# Patient Record
Sex: Female | Born: 1950 | Hispanic: Yes | Marital: Married | Smoking: Former smoker
Health system: Southern US, Community
[De-identification: ages and names within clinical notes are randomized; demographics above are authoritative.]

## PROBLEM LIST (undated history)

## (undated) ENCOUNTER — Telehealth

## (undated) ENCOUNTER — Encounter

## (undated) DIAGNOSIS — F419 Anxiety disorder, unspecified: Secondary | ICD-10-CM

## (undated) DIAGNOSIS — G959 Disease of spinal cord, unspecified: Secondary | ICD-10-CM

## (undated) DIAGNOSIS — K219 Gastro-esophageal reflux disease without esophagitis: Secondary | ICD-10-CM

## (undated) DIAGNOSIS — G629 Polyneuropathy, unspecified: Secondary | ICD-10-CM

## (undated) DIAGNOSIS — Z950 Presence of cardiac pacemaker: Secondary | ICD-10-CM

## (undated) DIAGNOSIS — G4730 Sleep apnea, unspecified: Secondary | ICD-10-CM

## (undated) DIAGNOSIS — M4012 Other secondary kyphosis, cervical region: Secondary | ICD-10-CM

## (undated) DIAGNOSIS — M810 Age-related osteoporosis without current pathological fracture: Secondary | ICD-10-CM

## (undated) DIAGNOSIS — I739 Peripheral vascular disease, unspecified: Secondary | ICD-10-CM

## (undated) DIAGNOSIS — I890 Lymphedema, not elsewhere classified: Secondary | ICD-10-CM

## (undated) DIAGNOSIS — F3342 Major depressive disorder, recurrent, in full remission: Secondary | ICD-10-CM

## (undated) DIAGNOSIS — M4316 Spondylolisthesis, lumbar region: Secondary | ICD-10-CM

## (undated) DIAGNOSIS — M1990 Unspecified osteoarthritis, unspecified site: Secondary | ICD-10-CM

## (undated) DIAGNOSIS — R011 Cardiac murmur, unspecified: Secondary | ICD-10-CM

## (undated) DIAGNOSIS — I6522 Occlusion and stenosis of left carotid artery: Secondary | ICD-10-CM

## (undated) DIAGNOSIS — R0602 Shortness of breath: Secondary | ICD-10-CM

## (undated) DIAGNOSIS — E119 Type 2 diabetes mellitus without complications: Secondary | ICD-10-CM

## (undated) DIAGNOSIS — G2581 Restless legs syndrome: Secondary | ICD-10-CM

## (undated) DIAGNOSIS — K222 Esophageal obstruction: Secondary | ICD-10-CM

## (undated) DIAGNOSIS — M48061 Spinal stenosis, lumbar region without neurogenic claudication: Secondary | ICD-10-CM

## (undated) DIAGNOSIS — M797 Fibromyalgia: Secondary | ICD-10-CM

## (undated) DIAGNOSIS — M4802 Spinal stenosis, cervical region: Secondary | ICD-10-CM

## (undated) DIAGNOSIS — E785 Hyperlipidemia, unspecified: Secondary | ICD-10-CM

## (undated) DIAGNOSIS — N393 Stress incontinence (female) (male): Secondary | ICD-10-CM

## (undated) DIAGNOSIS — R32 Unspecified urinary incontinence: Secondary | ICD-10-CM

## (undated) DIAGNOSIS — R112 Nausea with vomiting, unspecified: Secondary | ICD-10-CM

## (undated) DIAGNOSIS — I872 Venous insufficiency (chronic) (peripheral): Secondary | ICD-10-CM

## (undated) DIAGNOSIS — I5189 Other ill-defined heart diseases: Secondary | ICD-10-CM

## (undated) DIAGNOSIS — I10 Essential (primary) hypertension: Secondary | ICD-10-CM

## (undated) DIAGNOSIS — G4700 Insomnia, unspecified: Secondary | ICD-10-CM

## (undated) DIAGNOSIS — Z9889 Other specified postprocedural states: Secondary | ICD-10-CM

## (undated) DIAGNOSIS — Z86718 Personal history of other venous thrombosis and embolism: Secondary | ICD-10-CM

## (undated) DIAGNOSIS — Q76412 Congenital kyphosis, cervical region: Secondary | ICD-10-CM

## (undated) DIAGNOSIS — F32A Depression, unspecified: Secondary | ICD-10-CM

## (undated) HISTORY — PX: ESOPHAGEAL DILATION: SHX303

## (undated) HISTORY — DX: Essential (primary) hypertension: I10

## (undated) HISTORY — PX: HERNIA REPAIR: SHX51

## (undated) HISTORY — PX: APPENDECTOMY: SHX54

## (undated) HISTORY — DX: Restless legs syndrome: G25.81

## (undated) HISTORY — DX: Gastro-esophageal reflux disease without esophagitis: K21.9

## (undated) HISTORY — DX: Type 2 diabetes mellitus without complications: E11.9

## (undated) HISTORY — DX: Sleep apnea, unspecified: G47.30

## (undated) HISTORY — PX: MEDIAL PARTIAL KNEE REPLACEMENT: SHX5965

## (undated) HISTORY — PX: JOINT REPLACEMENT: SHX530

## (undated) HISTORY — DX: Presence of cardiac pacemaker: Z95.0

## (undated) HISTORY — DX: Hyperlipidemia, unspecified: E78.5

## (undated) HISTORY — DX: Peripheral vascular disease, unspecified: I73.9

## (undated) HISTORY — PX: ABDOMINAL HYSTERECTOMY: SHX81

## (undated) HISTORY — DX: Cardiac murmur, unspecified: R01.1

## (undated) HISTORY — PX: POLYPECTOMY: SHX149

## (undated) HISTORY — PX: DG C-ARM 2 VIEW RIGHT SHOULDER (ARMC HX): HXRAD1461

## (undated) HISTORY — PX: OTHER SURGICAL HISTORY: SHX169

## (undated) HISTORY — DX: Unspecified urinary incontinence: R32

## (undated) HISTORY — PX: PACEMAKER IMPLANT: EP1218

## (undated) HISTORY — PX: HYSTERECTOMY ABDOMINAL WITH SALPINGECTOMY: SHX6725

## (undated) HISTORY — PX: BREAST SURGERY: SHX581

## (undated) HISTORY — PX: CARPAL TUNNEL RELEASE: SHX101

## (undated) HISTORY — PX: GASTRIC BYPASS: SHX52

## (undated) HISTORY — PX: DG C-ARM 2 VIEW LEFT SHOULDER (ARMC HX): HXRAD1456

---

## 1996-02-29 HISTORY — PX: CARPAL TUNNEL RELEASE: SHX101

## 2010-02-28 HISTORY — PX: ESOPHAGEAL DILATION: SHX303

## 2010-02-28 HISTORY — PX: OTHER SURGICAL HISTORY: SHX169

## 2012-02-29 HISTORY — PX: SHOULDER SURGERY: SHX246

## 2012-02-29 HISTORY — PX: APPENDECTOMY: SHX54

## 2014-02-28 DIAGNOSIS — Z860101 Personal history of adenomatous and serrated colon polyps: Secondary | ICD-10-CM | POA: Insufficient documentation

## 2014-02-28 DIAGNOSIS — Z86010 Personal history of colonic polyps: Secondary | ICD-10-CM | POA: Insufficient documentation

## 2014-07-31 DIAGNOSIS — R32 Unspecified urinary incontinence: Secondary | ICD-10-CM | POA: Insufficient documentation

## 2014-07-31 DIAGNOSIS — G4733 Obstructive sleep apnea (adult) (pediatric): Secondary | ICD-10-CM | POA: Insufficient documentation

## 2014-07-31 DIAGNOSIS — E782 Mixed hyperlipidemia: Secondary | ICD-10-CM | POA: Insufficient documentation

## 2014-07-31 DIAGNOSIS — E1169 Type 2 diabetes mellitus with other specified complication: Secondary | ICD-10-CM | POA: Insufficient documentation

## 2014-07-31 DIAGNOSIS — G629 Polyneuropathy, unspecified: Secondary | ICD-10-CM | POA: Insufficient documentation

## 2014-07-31 DIAGNOSIS — M797 Fibromyalgia: Secondary | ICD-10-CM | POA: Insufficient documentation

## 2014-07-31 DIAGNOSIS — K219 Gastro-esophageal reflux disease without esophagitis: Secondary | ICD-10-CM | POA: Insufficient documentation

## 2014-07-31 DIAGNOSIS — M1990 Unspecified osteoarthritis, unspecified site: Secondary | ICD-10-CM | POA: Insufficient documentation

## 2014-12-16 DIAGNOSIS — I890 Lymphedema, not elsewhere classified: Secondary | ICD-10-CM | POA: Insufficient documentation

## 2015-01-13 DIAGNOSIS — E1149 Type 2 diabetes mellitus with other diabetic neurological complication: Secondary | ICD-10-CM | POA: Insufficient documentation

## 2015-03-01 HISTORY — PX: SHOULDER SURGERY: SHX246

## 2015-03-01 HISTORY — PX: PACEMAKER IMPLANT: EP1218

## 2015-05-30 DIAGNOSIS — Z950 Presence of cardiac pacemaker: Secondary | ICD-10-CM

## 2015-05-30 HISTORY — PX: PACEMAKER IMPLANT: EP1218

## 2015-05-30 HISTORY — DX: Presence of cardiac pacemaker: Z95.0

## 2015-08-04 DIAGNOSIS — N950 Postmenopausal bleeding: Secondary | ICD-10-CM | POA: Insufficient documentation

## 2015-12-23 ENCOUNTER — Encounter: Payer: Self-pay | Admitting: Family Medicine

## 2016-02-29 HISTORY — PX: TOTAL KNEE ARTHROPLASTY: SHX125

## 2016-06-20 DIAGNOSIS — R002 Palpitations: Secondary | ICD-10-CM | POA: Insufficient documentation

## 2017-01-17 ENCOUNTER — Encounter: Payer: Self-pay | Admitting: Family Medicine

## 2017-01-17 LAB — HM COLONOSCOPY

## 2017-02-28 HISTORY — PX: GASTRIC BYPASS: SHX52

## 2017-02-28 HISTORY — PX: HERNIA REPAIR: SHX51

## 2017-04-18 ENCOUNTER — Ambulatory Visit: Admit: 2017-04-18 | Discharge: 2017-04-19 | Attending: Obstetrics & Gynecology | Primary: Family Medicine

## 2017-04-18 DIAGNOSIS — N950 Postmenopausal bleeding: Principal | ICD-10-CM

## 2017-04-18 DIAGNOSIS — Z6841 Body Mass Index (BMI) 40.0 and over, adult: Secondary | ICD-10-CM

## 2017-04-18 DIAGNOSIS — Z79899 Other long term (current) drug therapy: Secondary | ICD-10-CM

## 2017-04-18 NOTE — Progress Notes
DEPARTMENT OF OBSTETRICS AND GYNECOLOGY    RETURN GYN PROGRESS NOTE      Patient ID:  Name:  Karen Fuentes   MRN: [id number]   Age: 67 y.o.   DOB: 1950-03-21     LMP:  No LMP recorded. Patient is postmenopausal.    Payor: MEDICARE [employer] / Plan: MEDICARE [employer] CHOICE PPO / Product Type: PPO /     Chief Complaint: pmp bleeding    HPI: Karen Fuentes is a 67 y.o. No obstetric history on file. who presents today for referred by pvt md for pmp bleeding, she bleeds every 3-6 months.  Has apparently had a workup including hysteroscopy    Will request copy of office records, transferred referral records in Media tab do not include any important clinical information.         Subjective pain rating: 0 out of 10.    Gyn History:  Patient denies history of STD, PID or abnormal Paps.     Review of Systems:     Constitutional: negative for fevers, chills, sweats, anorexia and weight loss  Respiratory: negative for cough, sputum, hemoptysis or wheezing  Cardiovascular: negative for chest pain, palpitations, syncope, orthopnea, tachypnea  Gastrointestinal: negative for dysphagia,  diarrhea and constipation.    Genitourinary:SEE HPI.  Negative for frequency, dysuria, genital lesions.  Integument: negative for rash, skin lesion(s) and pruritus  Hematologic/lymphatic: negative for easy bruising and petechiae  Musculoskeletal:negative for myalgias, arthralgias and muscle weakness  Neurological: negative for headaches, paresthesia, weakness and numbness  Behavioral/Psych: negative for abusive relationship, anxiety and depression  Endocrine: negative for polyuria, polydipsia and temperature intolerance  Allergic/Immunologic: negative for urticaria and angioedema     Home Medications:    Current Outpatient Medications    Medication Sig Start Date End Date Taking? Authorizing Provider   atorvastatin (LIPITOR) 80 MG Tablet  12/17/14   Information, Historical   doxepin (SINEquan) 50 MG Capsule  03/19/15   Information, Historical

## 2017-04-18 NOTE — Progress Notes
hydrOXYzine pamoate (VISTARIL) 25 MG Capsule  01/30/15   Information, Historical   IRON PO Take 65 mg by mouth daily.    Information, Historical   metFORMIN (GLUCOPHAGE) 500 MG Tablet 1,000 mg 2 times daily (with meals).  01/13/15   Information, Historical   omeprazole (PriLOSEC) 40 MG Capsule Delayed Release  11/07/14   Information, Historical   valsartan-hydroCHLOROthiazide (DIOVAN-HCT) 320-25 MG Tablet  12/17/14   Information, Historical       Allergies:  Patient has no known allergies.    Physical Exam   BP 135/71  - Pulse 98  - Temp 36.6 ?C (97.9 ?F)  - Resp 18  - Ht 1.575 m (5\' 2" )  - Wt 123.4 kg (272 lb)  - BMI 49.75 kg/m?   Body mass index is 49.75 kg/m?Marland Kitchen.   General: alert, cooperative, no distress, appears stated age, morbidly obese, Well developed, well nourished         Genitourinary: deferred      Face to Face Time:  10 of a  10 minute visit.    Procedure:      Lab/Imaging Review:  not reviewed    Medical Decision Making:  Records reviewed: transferrred records in media tab    Assessment and Plan:    ICD-10-CM ICD-9-CM   1. Postmenopausal bleeding N95.0 627.1       Orders:  No orders of the defined types were placed in this encounter.      Medications Prescribed:  No orders of the defined types were placed in this encounter.      Follow up:  Return in about 3 weeks (around 05/09/2017).        Malon KindleJames L Jones, MD  04/18/2017

## 2017-05-09 ENCOUNTER — Ambulatory Visit: Admit: 2017-05-09 | Discharge: 2017-05-09 | Attending: Obstetrics & Gynecology | Primary: Family Medicine

## 2017-05-09 DIAGNOSIS — K219 Gastro-esophageal reflux disease without esophagitis: Secondary | ICD-10-CM

## 2017-05-09 DIAGNOSIS — Z79899 Other long term (current) drug therapy: Secondary | ICD-10-CM

## 2017-05-09 DIAGNOSIS — Z87891 Personal history of nicotine dependence: Secondary | ICD-10-CM

## 2017-05-09 DIAGNOSIS — M1990 Unspecified osteoarthritis, unspecified site: Secondary | ICD-10-CM

## 2017-05-09 DIAGNOSIS — Z01810 Encounter for preprocedural cardiovascular examination: Principal | ICD-10-CM

## 2017-05-09 DIAGNOSIS — I10 Essential (primary) hypertension: Secondary | ICD-10-CM

## 2017-05-09 DIAGNOSIS — E785 Hyperlipidemia, unspecified: Secondary | ICD-10-CM

## 2017-05-09 DIAGNOSIS — Z950 Presence of cardiac pacemaker: Secondary | ICD-10-CM

## 2017-05-09 DIAGNOSIS — Z90710 Acquired absence of both cervix and uterus: Secondary | ICD-10-CM

## 2017-05-09 DIAGNOSIS — F329 Major depressive disorder, single episode, unspecified: Secondary | ICD-10-CM

## 2017-05-09 DIAGNOSIS — I459 Conduction disorder, unspecified: Secondary | ICD-10-CM

## 2017-05-09 DIAGNOSIS — Z7984 Long term (current) use of oral hypoglycemic drugs: Secondary | ICD-10-CM

## 2017-05-09 DIAGNOSIS — F419 Anxiety disorder, unspecified: Secondary | ICD-10-CM

## 2017-05-09 DIAGNOSIS — Z6841 Body Mass Index (BMI) 40.0 and over, adult: Secondary | ICD-10-CM

## 2017-05-09 DIAGNOSIS — N950 Postmenopausal bleeding: Secondary | ICD-10-CM

## 2017-05-09 NOTE — Progress Notes
Cardiac: Regular rate and rhythm, no murmurs, rubs, gallops  Breast: Not Assessed    Abdominal: Soft, nontender, no masses, no organomegaly, no hernia    Back/Spine:  Good range of motion, no palpable abnormalities.    Skin: No visible lesions, rashes, or ulcers    Extremities: No cyanosis, clubbing, or edema, distal pulses 2+    Neurologic: Cranial nerves grossly intact, DTRs 2+ bilaterally    Psychiatric: Alert and oriented x 3, no evidence of anxiety or depression.    Genitourinary: exam obscured by obesity, external genitalia normal, Unable to palpate cervix or uterus, roughness noted on anterior vaginal wall at pubic symphysis      Face to Face Time:  15 of a  15 minute visit.    Procedure:   None     Lab Review:  No results found for this or any previous visit (from the past 24 hour(s)).    Medical Decision Making:  Records reviewed: Transferred records reviewed    Assessment and Plan:    ICD-10-CM ICD-9-CM    1. Postmenopausal bleeding N95.0 627.1 Surgical Case Request: HYSTEROSCOPY, DIAGNOSTIC, D & C, DIAGNOSTIC, NON-OBSTETRICAL      Surgical Case Request: HYSTEROSCOPY, DIAGNOSTIC, D & C, DIAGNOSTIC, NON-OBSTETRICAL   2. Pre-operative cardiovascular examination Z01.810 V72.81 EKG - TRACING      EKG - TRACING       Follow up:           Payor: MEDICARE HU[employer]NA / Plan: MEDICARE [employer] CHOICE PPO / Product Type: PPO /     Malon KindleJames L Jones, MD  05/09/2017

## 2017-05-09 NOTE — Progress Notes
DEPARTMENT OF OBSTETRICS AND GYNECOLOGY    GYN PROGRESS NOTE      Name:  Karen Fuentes   MRN: [id number]   Age: 67 y.o.   DOB: [birth year]     LMP:  No LMP recorded. Patient is postmenopausal.    Chief Complaint: pmp bleeding    HPI: Karen Fuentes is a 67 y.o. No obstetric history on file. who presents today for pmp bleeding, last like a light period in January 2019.  She had been previously evaluated for pmp bleeding, including a D&C which was negative for atypia in 2018.     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    Past Medical History:   Past Medical History:   Diagnosis Date   ? Anxiety    ? Arthritis    ? Depression    ? GERD (gastroesophageal reflux disease)    ? Heart murmur    ? Hyperlipidemia    ? Hypertension        Past Surgical History:   Past Surgical History:   Procedure Laterality Date   ? APPENDECTOMY     ? ARTHROSCOPY SHOULDER W/ ACROMIAL REPAIR Right 03/10/2015    ARTHROSCOPY SHOULDER W/ ACROMIAL REPAIR [facility]runelli[facility]   ? ARTHROSCOPY SHOULDER W/ ROTATOR CUFF REPAIR Right 03/10/2015    ARTHROSCOPY SHOULDER W/ ROTATOR CUFF REPAIR SUBACROMIAL DECOMPRESSION performed by Venancio PoissonBrunelli, Jeffrey Allen[facility]   ? CARPAL TUNNEL RELEASE Left    ? HYSTERECTOMY     ? SHOULDER SURGERY Left        OB History:   Obstetric History     No data available       Gyn History:  Patient denies history of STD, PID or abnormal Paps.     Family History:  Denies any family history of uterine, ovarian, breast or colon cancer.    No family history on file.    Social History:   Social History   Substance Use Topics   ? Smoking status: Former Smoker     Years: 30.00   ? Smokeless tobacco: Former NeurosurgeonUser     Quit date: 02/02/2008   ? Alcohol use Yes

## 2017-05-09 NOTE — Progress Notes
Review of Systems:     Constitutional: negative for fevers, chills, sweats, anorexia and weight loss  Respiratory: negative for cough, sputum, hemoptysis or wheezing  Cardiovascular: negative for chest pain, palpitations, syncope, orthopnea, tachypnea  Gastrointestinal: negative for dysphagia,  diarrhea and constipation.    Genitourinary:SEE HPI.  Negative for frequency, dysuria, genital lesions.  Integument: negative for rash, skin lesion(s) and pruritus  Hematologic/lymphatic: negative for easy bruising and petechiae  Musculoskeletal:negative for myalgias, arthralgias and muscle weakness  Neurological: negative for headaches, paresthesia, weakness and numbness  Behavioral/Psych: negative for abusive relationship, anxiety and depression  Endocrine: negative for polyuria, polydipsia and temperature intolerance  Allergic/Immunologic: negative for urticaria and angioedema     Home Medications:    Outpatient Medications Prior to Visit   Medication Sig   ? atorvastatin (LIPITOR) 80 MG Tablet    ? doxepin (SINEquan) 50 MG Capsule    ? hydrOXYzine pamoate (VISTARIL) 25 MG Capsule    ? IRON PO Take 65 mg by mouth daily.   ? metFORMIN (GLUCOPHAGE) 500 MG Tablet 1,000 mg 2 times daily (with meals).    ? omeprazole (PriLOSEC) 40 MG Capsule Delayed Release    ? valsartan-hydroCHLOROthiazide (DIOVAN-HCT) 320-25 MG Tablet      Last reviewed on 05/09/2017  2:18 PM by Malon KindleJones, James L, MD    Allergies:  Patient has no known allergies.    Physical Exam Exam markedly limited by girth  BP 155/82  - Pulse 92  - Temp 36.6 ?C (97.8 ?F)  - Resp 18  - Ht 1.575 m (5\' 2" )  - Wt 125.9 kg (277 lb 8 oz)  - BMI 50.76 kg/m?   Body mass index is 50.76 kg/m?Marland Kitchen.   General: alert, cooperative, no distress, appears stated age, morbidly obese, Well developed, well nourished     HEENT:  normal dentition, moist mucous membranes.  Neck: Supple, trachea midline, no thyromegaly.    Lungs: clear to auscultation bilaterally with no wheezes, rhonchi, or crackles

## 2017-06-13 DIAGNOSIS — N950 Postmenopausal bleeding: Principal | ICD-10-CM

## 2017-06-13 DIAGNOSIS — E785 Hyperlipidemia, unspecified: Secondary | ICD-10-CM

## 2017-06-13 DIAGNOSIS — I10 Essential (primary) hypertension: Secondary | ICD-10-CM

## 2017-06-13 DIAGNOSIS — Z87891 Personal history of nicotine dependence: Secondary | ICD-10-CM

## 2017-06-13 DIAGNOSIS — Z79899 Other long term (current) drug therapy: Secondary | ICD-10-CM

## 2017-06-13 DIAGNOSIS — N393 Stress incontinence (female) (male): Secondary | ICD-10-CM

## 2017-06-13 DIAGNOSIS — K219 Gastro-esophageal reflux disease without esophagitis: Secondary | ICD-10-CM

## 2017-07-31 ENCOUNTER — Ambulatory Visit: Admit: 2017-07-31 | Discharge: 2017-07-31 | Attending: Obstetrics & Gynecology | Primary: Family Medicine

## 2017-07-31 DIAGNOSIS — Z87891 Personal history of nicotine dependence: Secondary | ICD-10-CM

## 2017-07-31 DIAGNOSIS — K219 Gastro-esophageal reflux disease without esophagitis: Secondary | ICD-10-CM

## 2017-07-31 DIAGNOSIS — F329 Major depressive disorder, single episode, unspecified: Secondary | ICD-10-CM

## 2017-07-31 DIAGNOSIS — R011 Cardiac murmur, unspecified: Secondary | ICD-10-CM

## 2017-07-31 DIAGNOSIS — F419 Anxiety disorder, unspecified: Secondary | ICD-10-CM

## 2017-07-31 DIAGNOSIS — I10 Essential (primary) hypertension: Secondary | ICD-10-CM

## 2017-07-31 DIAGNOSIS — Z79899 Other long term (current) drug therapy: Secondary | ICD-10-CM

## 2017-07-31 DIAGNOSIS — N950 Postmenopausal bleeding: Principal | ICD-10-CM

## 2017-07-31 DIAGNOSIS — N393 Stress incontinence (female) (male): Secondary | ICD-10-CM

## 2017-07-31 DIAGNOSIS — M1990 Unspecified osteoarthritis, unspecified site: Principal | ICD-10-CM

## 2017-07-31 DIAGNOSIS — E785 Hyperlipidemia, unspecified: Secondary | ICD-10-CM

## 2017-07-31 DIAGNOSIS — Z950 Presence of cardiac pacemaker: Secondary | ICD-10-CM

## 2017-08-03 DIAGNOSIS — E785 Hyperlipidemia, unspecified: Secondary | ICD-10-CM

## 2017-08-03 DIAGNOSIS — Z79899 Other long term (current) drug therapy: Secondary | ICD-10-CM

## 2017-08-03 DIAGNOSIS — I10 Essential (primary) hypertension: Secondary | ICD-10-CM

## 2017-08-03 DIAGNOSIS — Z87891 Personal history of nicotine dependence: Secondary | ICD-10-CM

## 2017-08-03 DIAGNOSIS — K219 Gastro-esophageal reflux disease without esophagitis: Secondary | ICD-10-CM

## 2017-08-03 DIAGNOSIS — N393 Stress incontinence (female) (male): Secondary | ICD-10-CM

## 2017-08-03 DIAGNOSIS — N950 Postmenopausal bleeding: Principal | ICD-10-CM

## 2017-08-04 DIAGNOSIS — M1990 Unspecified osteoarthritis, unspecified site: Principal | ICD-10-CM

## 2017-08-04 DIAGNOSIS — Z79899 Other long term (current) drug therapy: Secondary | ICD-10-CM

## 2017-08-04 DIAGNOSIS — Z01810 Encounter for preprocedural cardiovascular examination: Secondary | ICD-10-CM

## 2017-08-04 DIAGNOSIS — K219 Gastro-esophageal reflux disease without esophagitis: Secondary | ICD-10-CM

## 2017-08-04 DIAGNOSIS — N393 Stress incontinence (female) (male): Secondary | ICD-10-CM

## 2017-08-04 DIAGNOSIS — I10 Essential (primary) hypertension: Secondary | ICD-10-CM

## 2017-08-04 DIAGNOSIS — R011 Cardiac murmur, unspecified: Secondary | ICD-10-CM

## 2017-08-04 DIAGNOSIS — Z87891 Personal history of nicotine dependence: Secondary | ICD-10-CM

## 2017-08-04 DIAGNOSIS — E785 Hyperlipidemia, unspecified: Secondary | ICD-10-CM

## 2017-08-04 DIAGNOSIS — F419 Anxiety disorder, unspecified: Secondary | ICD-10-CM

## 2017-08-04 DIAGNOSIS — F329 Major depressive disorder, single episode, unspecified: Secondary | ICD-10-CM

## 2017-08-04 DIAGNOSIS — N950 Postmenopausal bleeding: Principal | ICD-10-CM

## 2017-08-04 MED ORDER — LOSARTAN POTASSIUM-HCTZ 100-25 MG PO TABS
ORAL_TABLET
Start: 2017-08-04 — End: ?

## 2017-08-04 MED ORDER — FUROSEMIDE 40 MG PO TABS: Start: 2017-08-04 — End: ?

## 2017-08-04 MED ORDER — ASPIRIN 81 MG PO TABS
Freq: Every day
Start: 2017-08-04 — End: ?

## 2017-08-04 MED ORDER — PRAMIPEXOLE DIHYDROCHLORIDE ER 4.5 MG PO TB24
ORAL_TABLET | Freq: Every day
Start: 2017-08-04 — End: ?

## 2017-08-04 MED ORDER — METFORMIN HCL 500 MG PO TABS: Start: 2017-08-04 — End: 2017-08-04

## 2017-08-11 ENCOUNTER — Ambulatory Visit

## 2017-08-11 ENCOUNTER — Inpatient Hospital Stay: Admit: 2017-08-11 | Discharge: 2017-08-11 | Primary: Family Medicine

## 2017-08-11 ENCOUNTER — Inpatient Hospital Stay

## 2017-08-11 DIAGNOSIS — I10 Essential (primary) hypertension: Secondary | ICD-10-CM

## 2017-08-11 DIAGNOSIS — K219 Gastro-esophageal reflux disease without esophagitis: Secondary | ICD-10-CM

## 2017-08-11 DIAGNOSIS — E785 Hyperlipidemia, unspecified: Secondary | ICD-10-CM

## 2017-08-11 DIAGNOSIS — F329 Major depressive disorder, single episode, unspecified: Secondary | ICD-10-CM

## 2017-08-11 DIAGNOSIS — Z87891 Personal history of nicotine dependence: Secondary | ICD-10-CM

## 2017-08-11 DIAGNOSIS — N950 Postmenopausal bleeding: Principal | ICD-10-CM

## 2017-08-11 DIAGNOSIS — R011 Cardiac murmur, unspecified: Secondary | ICD-10-CM

## 2017-08-11 DIAGNOSIS — N393 Stress incontinence (female) (male): Secondary | ICD-10-CM

## 2017-08-11 DIAGNOSIS — F419 Anxiety disorder, unspecified: Secondary | ICD-10-CM

## 2017-08-11 DIAGNOSIS — Z79899 Other long term (current) drug therapy: Secondary | ICD-10-CM

## 2017-08-11 DIAGNOSIS — M1990 Unspecified osteoarthritis, unspecified site: Principal | ICD-10-CM

## 2017-08-11 MED ORDER — LACTATED RINGERS IV SOLN
Status: DC | PRN
Start: 2017-08-11 — End: 2017-08-12

## 2017-08-11 MED ORDER — SUGAMMADEX SODIUM 200 MG/2ML IV SOLN
Status: DC | PRN
Start: 2017-08-11 — End: 2017-08-12

## 2017-08-11 MED ORDER — NALBUPHINE HCL 10 MG/ML IJ SOLN
2.5 mg | INTRAVENOUS | Status: DC | PRN
Start: 2017-08-11 — End: 2017-08-12

## 2017-08-11 MED ORDER — SUCCINYLCHOLINE CHLORIDE 140 MG/7ML IV SOSY
Status: DC | PRN
Start: 2017-08-11 — End: 2017-08-12

## 2017-08-11 MED ORDER — FENTANYL CITRATE INJ 50 MCG/ML CUSTOM AMP/VIAL
Status: DC | PRN
Start: 2017-08-11 — End: 2017-08-12

## 2017-08-11 MED ORDER — NALOXONE HCL 0.4 MG/ML IJ SOLN
.4 mg | INTRAVENOUS | Status: DC | PRN
Start: 2017-08-11 — End: 2017-08-12

## 2017-08-11 MED ORDER — LABETALOL HCL 5 MG/ML IV SOLN SH
10 mg | INTRAVENOUS | Status: DC | PRN
Start: 2017-08-11 — End: 2017-08-12

## 2017-08-11 MED ORDER — KETOROLAC TROMETHAMINE 30 MG/ML IJ SOLN
Status: DC | PRN
Start: 2017-08-11 — End: 2017-08-12

## 2017-08-11 MED ORDER — LACTATED RINGERS IV SOLN
1000 mL | Freq: Once | INTRAVENOUS | Status: CP
Start: 2017-08-11 — End: ?

## 2017-08-11 MED ORDER — HALOPERIDOL LACTATE 5 MG/ML IJ SOLN
.5 mg | INTRAVENOUS | Status: DC | PRN
Start: 2017-08-11 — End: 2017-08-12

## 2017-08-11 MED ORDER — IBUPROFEN 800 MG PO TABS
800 mg | Freq: Four times a day (QID) | ORAL | 0 refills | Status: CP | PRN
Start: 2017-08-11 — End: ?

## 2017-08-11 MED ORDER — OXYCODONE HCL 5 MG PO TABS
10 mg | ORAL | Status: DC | PRN
Start: 2017-08-11 — End: 2017-08-12

## 2017-08-11 MED ORDER — HYDRALAZINE HCL 20 MG/ML IJ SOLN
5 mg | INTRAVENOUS | Status: DC | PRN
Start: 2017-08-11 — End: 2017-08-12

## 2017-08-11 MED ORDER — LIDOCAINE HCL (PF) 1 % IJ SOLN SH
INTRAVENOUS | Status: DC | PRN
Start: 2017-08-11 — End: 2017-08-12

## 2017-08-11 MED ORDER — ROCURONIUM BROMIDE 50 MG/5ML IV SOSY
Status: DC | PRN
Start: 2017-08-11 — End: 2017-08-12

## 2017-08-11 MED ORDER — PHENYLEPHRINE HCL-NACL 0.1-0.9 MG/ML-% IV SOLN SH
Status: DC | PRN
Start: 2017-08-11 — End: 2017-08-12

## 2017-08-11 MED ORDER — PROMETHAZINE HCL 25 MG/ML IJ SOLN
6.25 mg | INTRAVENOUS | Status: DC | PRN
Start: 2017-08-11 — End: 2017-08-12

## 2017-08-11 MED ORDER — ONDANSETRON HCL 4 MG/2ML IJ SOLN
Status: DC | PRN
Start: 2017-08-11 — End: 2017-08-12

## 2017-08-11 MED ORDER — DEXAMETHASONE SODIUM PHOSPHATE 4 MG/ML CUSTOM COMPONENT JX
Status: DC | PRN
Start: 2017-08-11 — End: 2017-08-12

## 2017-08-11 MED ORDER — PROPOFOL 10 MG/ML IV EMUL CUSTOM SH
Status: DC | PRN
Start: 2017-08-11 — End: 2017-08-12

## 2017-08-11 MED ORDER — HYDROMORPHONE HCL 1 MG/ML IJ SOLN
.5 mg | INTRAVENOUS | Status: DC | PRN
Start: 2017-08-11 — End: 2017-08-12

## 2017-08-11 MED ORDER — SILVER NITRATE-POT NITRATE 75-25 % EX MISC
Status: DC | PRN
Start: 2017-08-11 — End: 2017-08-12

## 2017-08-14 DIAGNOSIS — K219 Gastro-esophageal reflux disease without esophagitis: Secondary | ICD-10-CM

## 2017-08-14 DIAGNOSIS — M1990 Unspecified osteoarthritis, unspecified site: Principal | ICD-10-CM

## 2017-08-14 DIAGNOSIS — E785 Hyperlipidemia, unspecified: Secondary | ICD-10-CM

## 2017-08-14 DIAGNOSIS — F329 Major depressive disorder, single episode, unspecified: Secondary | ICD-10-CM

## 2017-08-14 DIAGNOSIS — I10 Essential (primary) hypertension: Secondary | ICD-10-CM

## 2017-08-14 DIAGNOSIS — F419 Anxiety disorder, unspecified: Secondary | ICD-10-CM

## 2017-08-14 DIAGNOSIS — R011 Cardiac murmur, unspecified: Secondary | ICD-10-CM

## 2017-08-28 ENCOUNTER — Encounter: Attending: Obstetrics & Gynecology | Primary: Family Medicine

## 2017-08-30 ENCOUNTER — Ambulatory Visit: Admit: 2017-08-30 | Discharge: 2017-08-30 | Attending: Obstetrics & Gynecology | Primary: Family Medicine

## 2017-08-30 DIAGNOSIS — E785 Hyperlipidemia, unspecified: Secondary | ICD-10-CM

## 2017-08-30 DIAGNOSIS — K219 Gastro-esophageal reflux disease without esophagitis: Secondary | ICD-10-CM

## 2017-08-30 DIAGNOSIS — F329 Major depressive disorder, single episode, unspecified: Secondary | ICD-10-CM

## 2017-08-30 DIAGNOSIS — I10 Essential (primary) hypertension: Secondary | ICD-10-CM

## 2017-08-30 DIAGNOSIS — R011 Cardiac murmur, unspecified: Secondary | ICD-10-CM

## 2017-08-30 DIAGNOSIS — N950 Postmenopausal bleeding: Principal | ICD-10-CM

## 2017-08-30 DIAGNOSIS — M1990 Unspecified osteoarthritis, unspecified site: Principal | ICD-10-CM

## 2017-08-30 DIAGNOSIS — F419 Anxiety disorder, unspecified: Secondary | ICD-10-CM

## 2018-03-06 ENCOUNTER — Encounter: Attending: Obstetrics & Gynecology | Primary: Family Medicine

## 2018-09-12 DIAGNOSIS — R0602 Shortness of breath: Secondary | ICD-10-CM | POA: Insufficient documentation

## 2018-09-12 DIAGNOSIS — Z950 Presence of cardiac pacemaker: Secondary | ICD-10-CM | POA: Insufficient documentation

## 2018-10-12 LAB — HEMOGLOBIN A1C: Hemoglobin A1C: 5.5

## 2018-10-18 HISTORY — PX: TOTAL KNEE ARTHROPLASTY: SHX125

## 2018-10-19 DIAGNOSIS — M1712 Unilateral primary osteoarthritis, left knee: Secondary | ICD-10-CM | POA: Insufficient documentation

## 2018-10-31 DIAGNOSIS — M810 Age-related osteoporosis without current pathological fracture: Secondary | ICD-10-CM | POA: Insufficient documentation

## 2018-10-31 DIAGNOSIS — F419 Anxiety disorder, unspecified: Secondary | ICD-10-CM | POA: Insufficient documentation

## 2018-10-31 DIAGNOSIS — R609 Edema, unspecified: Secondary | ICD-10-CM | POA: Insufficient documentation

## 2018-10-31 DIAGNOSIS — R011 Cardiac murmur, unspecified: Secondary | ICD-10-CM | POA: Insufficient documentation

## 2019-02-05 HISTORY — PX: OTHER SURGICAL HISTORY: SHX169

## 2019-02-13 LAB — HM MAMMOGRAPHY

## 2019-03-06 DIAGNOSIS — Z96652 Presence of left artificial knee joint: Secondary | ICD-10-CM | POA: Diagnosis not present

## 2019-03-06 DIAGNOSIS — Z96659 Presence of unspecified artificial knee joint: Secondary | ICD-10-CM | POA: Diagnosis not present

## 2019-03-06 DIAGNOSIS — M25662 Stiffness of left knee, not elsewhere classified: Secondary | ICD-10-CM | POA: Diagnosis not present

## 2019-03-06 DIAGNOSIS — Z09 Encounter for follow-up examination after completed treatment for conditions other than malignant neoplasm: Secondary | ICD-10-CM | POA: Diagnosis not present

## 2019-03-06 DIAGNOSIS — R262 Difficulty in walking, not elsewhere classified: Secondary | ICD-10-CM | POA: Diagnosis not present

## 2019-03-06 DIAGNOSIS — M6281 Muscle weakness (generalized): Secondary | ICD-10-CM | POA: Diagnosis not present

## 2019-03-08 DIAGNOSIS — R262 Difficulty in walking, not elsewhere classified: Secondary | ICD-10-CM | POA: Diagnosis not present

## 2019-03-08 DIAGNOSIS — M25562 Pain in left knee: Secondary | ICD-10-CM | POA: Diagnosis not present

## 2019-03-08 DIAGNOSIS — M6281 Muscle weakness (generalized): Secondary | ICD-10-CM | POA: Diagnosis not present

## 2019-03-08 DIAGNOSIS — M25662 Stiffness of left knee, not elsewhere classified: Secondary | ICD-10-CM | POA: Diagnosis not present

## 2019-03-11 DIAGNOSIS — M25562 Pain in left knee: Secondary | ICD-10-CM | POA: Diagnosis not present

## 2019-03-11 DIAGNOSIS — M25662 Stiffness of left knee, not elsewhere classified: Secondary | ICD-10-CM | POA: Diagnosis not present

## 2019-03-11 DIAGNOSIS — R262 Difficulty in walking, not elsewhere classified: Secondary | ICD-10-CM | POA: Diagnosis not present

## 2019-03-11 DIAGNOSIS — M6281 Muscle weakness (generalized): Secondary | ICD-10-CM | POA: Diagnosis not present

## 2019-03-15 DIAGNOSIS — M25562 Pain in left knee: Secondary | ICD-10-CM | POA: Diagnosis not present

## 2019-03-15 DIAGNOSIS — M6281 Muscle weakness (generalized): Secondary | ICD-10-CM | POA: Diagnosis not present

## 2019-03-15 DIAGNOSIS — R262 Difficulty in walking, not elsewhere classified: Secondary | ICD-10-CM | POA: Diagnosis not present

## 2019-03-15 DIAGNOSIS — M25662 Stiffness of left knee, not elsewhere classified: Secondary | ICD-10-CM | POA: Diagnosis not present

## 2019-03-18 DIAGNOSIS — M25562 Pain in left knee: Secondary | ICD-10-CM | POA: Diagnosis not present

## 2019-03-18 DIAGNOSIS — M6281 Muscle weakness (generalized): Secondary | ICD-10-CM | POA: Diagnosis not present

## 2019-03-18 DIAGNOSIS — R262 Difficulty in walking, not elsewhere classified: Secondary | ICD-10-CM | POA: Diagnosis not present

## 2019-03-18 DIAGNOSIS — M25662 Stiffness of left knee, not elsewhere classified: Secondary | ICD-10-CM | POA: Diagnosis not present

## 2019-03-19 DIAGNOSIS — R921 Mammographic calcification found on diagnostic imaging of breast: Secondary | ICD-10-CM | POA: Diagnosis not present

## 2019-03-19 DIAGNOSIS — N6002 Solitary cyst of left breast: Secondary | ICD-10-CM | POA: Diagnosis not present

## 2019-03-20 DIAGNOSIS — E782 Mixed hyperlipidemia: Secondary | ICD-10-CM | POA: Diagnosis not present

## 2019-03-20 DIAGNOSIS — R0602 Shortness of breath: Secondary | ICD-10-CM | POA: Diagnosis not present

## 2019-03-20 DIAGNOSIS — Z950 Presence of cardiac pacemaker: Secondary | ICD-10-CM | POA: Diagnosis not present

## 2019-03-20 DIAGNOSIS — I739 Peripheral vascular disease, unspecified: Secondary | ICD-10-CM | POA: Diagnosis not present

## 2019-03-22 DIAGNOSIS — M25562 Pain in left knee: Secondary | ICD-10-CM | POA: Diagnosis not present

## 2019-03-22 DIAGNOSIS — R262 Difficulty in walking, not elsewhere classified: Secondary | ICD-10-CM | POA: Diagnosis not present

## 2019-03-22 DIAGNOSIS — M25662 Stiffness of left knee, not elsewhere classified: Secondary | ICD-10-CM | POA: Diagnosis not present

## 2019-03-22 DIAGNOSIS — M6281 Muscle weakness (generalized): Secondary | ICD-10-CM | POA: Diagnosis not present

## 2019-03-22 DIAGNOSIS — R928 Other abnormal and inconclusive findings on diagnostic imaging of breast: Secondary | ICD-10-CM | POA: Diagnosis not present

## 2019-03-25 DIAGNOSIS — M25662 Stiffness of left knee, not elsewhere classified: Secondary | ICD-10-CM | POA: Diagnosis not present

## 2019-03-25 DIAGNOSIS — M25562 Pain in left knee: Secondary | ICD-10-CM | POA: Diagnosis not present

## 2019-03-25 DIAGNOSIS — M6281 Muscle weakness (generalized): Secondary | ICD-10-CM | POA: Diagnosis not present

## 2019-03-25 DIAGNOSIS — R262 Difficulty in walking, not elsewhere classified: Secondary | ICD-10-CM | POA: Diagnosis not present

## 2019-03-29 DIAGNOSIS — R262 Difficulty in walking, not elsewhere classified: Secondary | ICD-10-CM | POA: Diagnosis not present

## 2019-03-29 DIAGNOSIS — M25562 Pain in left knee: Secondary | ICD-10-CM | POA: Diagnosis not present

## 2019-03-29 DIAGNOSIS — M6281 Muscle weakness (generalized): Secondary | ICD-10-CM | POA: Diagnosis not present

## 2019-03-29 DIAGNOSIS — M25662 Stiffness of left knee, not elsewhere classified: Secondary | ICD-10-CM | POA: Diagnosis not present

## 2019-04-01 DIAGNOSIS — M6281 Muscle weakness (generalized): Secondary | ICD-10-CM | POA: Diagnosis not present

## 2019-04-01 DIAGNOSIS — M25662 Stiffness of left knee, not elsewhere classified: Secondary | ICD-10-CM | POA: Diagnosis not present

## 2019-04-01 DIAGNOSIS — M25562 Pain in left knee: Secondary | ICD-10-CM | POA: Diagnosis not present

## 2019-04-01 DIAGNOSIS — R262 Difficulty in walking, not elsewhere classified: Secondary | ICD-10-CM | POA: Diagnosis not present

## 2019-04-05 DIAGNOSIS — M6281 Muscle weakness (generalized): Secondary | ICD-10-CM | POA: Diagnosis not present

## 2019-04-05 DIAGNOSIS — R262 Difficulty in walking, not elsewhere classified: Secondary | ICD-10-CM | POA: Diagnosis not present

## 2019-04-05 DIAGNOSIS — M25662 Stiffness of left knee, not elsewhere classified: Secondary | ICD-10-CM | POA: Diagnosis not present

## 2019-04-05 DIAGNOSIS — M25562 Pain in left knee: Secondary | ICD-10-CM | POA: Diagnosis not present

## 2019-04-12 DIAGNOSIS — Z09 Encounter for follow-up examination after completed treatment for conditions other than malignant neoplasm: Secondary | ICD-10-CM | POA: Diagnosis not present

## 2019-04-12 DIAGNOSIS — Z96652 Presence of left artificial knee joint: Secondary | ICD-10-CM | POA: Diagnosis not present

## 2019-04-15 DIAGNOSIS — R262 Difficulty in walking, not elsewhere classified: Secondary | ICD-10-CM | POA: Diagnosis not present

## 2019-04-15 DIAGNOSIS — M6281 Muscle weakness (generalized): Secondary | ICD-10-CM | POA: Diagnosis not present

## 2019-04-15 DIAGNOSIS — M25562 Pain in left knee: Secondary | ICD-10-CM | POA: Diagnosis not present

## 2019-04-15 DIAGNOSIS — M25662 Stiffness of left knee, not elsewhere classified: Secondary | ICD-10-CM | POA: Diagnosis not present

## 2019-04-22 DIAGNOSIS — R262 Difficulty in walking, not elsewhere classified: Secondary | ICD-10-CM | POA: Diagnosis not present

## 2019-04-22 DIAGNOSIS — M25662 Stiffness of left knee, not elsewhere classified: Secondary | ICD-10-CM | POA: Diagnosis not present

## 2019-04-22 DIAGNOSIS — M6281 Muscle weakness (generalized): Secondary | ICD-10-CM | POA: Diagnosis not present

## 2019-04-22 DIAGNOSIS — M25562 Pain in left knee: Secondary | ICD-10-CM | POA: Diagnosis not present

## 2019-04-26 DIAGNOSIS — R921 Mammographic calcification found on diagnostic imaging of breast: Secondary | ICD-10-CM | POA: Diagnosis not present

## 2019-04-26 DIAGNOSIS — R928 Other abnormal and inconclusive findings on diagnostic imaging of breast: Secondary | ICD-10-CM | POA: Diagnosis not present

## 2019-04-29 DIAGNOSIS — M6281 Muscle weakness (generalized): Secondary | ICD-10-CM | POA: Diagnosis not present

## 2019-04-29 DIAGNOSIS — M25562 Pain in left knee: Secondary | ICD-10-CM | POA: Diagnosis not present

## 2019-04-29 DIAGNOSIS — M25662 Stiffness of left knee, not elsewhere classified: Secondary | ICD-10-CM | POA: Diagnosis not present

## 2019-04-29 DIAGNOSIS — R921 Mammographic calcification found on diagnostic imaging of breast: Secondary | ICD-10-CM | POA: Diagnosis not present

## 2019-04-29 DIAGNOSIS — R262 Difficulty in walking, not elsewhere classified: Secondary | ICD-10-CM | POA: Diagnosis not present

## 2019-04-30 DIAGNOSIS — R920 Mammographic microcalcification found on diagnostic imaging of breast: Secondary | ICD-10-CM | POA: Diagnosis not present

## 2019-06-13 LAB — HM DIABETES EYE EXAM

## 2019-08-20 ENCOUNTER — Ambulatory Visit (INDEPENDENT_AMBULATORY_CARE_PROVIDER_SITE_OTHER): Payer: Medicare Other | Admitting: Family Medicine

## 2019-08-20 ENCOUNTER — Other Ambulatory Visit: Payer: Self-pay

## 2019-08-20 ENCOUNTER — Encounter: Payer: Self-pay | Admitting: Family Medicine

## 2019-08-20 VITALS — BP 116/68 | HR 77 | Temp 97.5°F | Resp 16 | Ht 62.0 in | Wt 200.6 lb

## 2019-08-20 DIAGNOSIS — E785 Hyperlipidemia, unspecified: Secondary | ICD-10-CM

## 2019-08-20 DIAGNOSIS — I872 Venous insufficiency (chronic) (peripheral): Secondary | ICD-10-CM

## 2019-08-20 DIAGNOSIS — G2581 Restless legs syndrome: Secondary | ICD-10-CM | POA: Diagnosis not present

## 2019-08-20 DIAGNOSIS — F3342 Major depressive disorder, recurrent, in full remission: Secondary | ICD-10-CM | POA: Insufficient documentation

## 2019-08-20 DIAGNOSIS — R600 Localized edema: Secondary | ICD-10-CM

## 2019-08-20 DIAGNOSIS — G4709 Other insomnia: Secondary | ICD-10-CM

## 2019-08-20 DIAGNOSIS — M150 Primary generalized (osteo)arthritis: Secondary | ICD-10-CM | POA: Insufficient documentation

## 2019-08-20 DIAGNOSIS — I83893 Varicose veins of bilateral lower extremities with other complications: Secondary | ICD-10-CM | POA: Insufficient documentation

## 2019-08-20 DIAGNOSIS — E1169 Type 2 diabetes mellitus with other specified complication: Secondary | ICD-10-CM

## 2019-08-20 DIAGNOSIS — M8949 Other hypertrophic osteoarthropathy, multiple sites: Secondary | ICD-10-CM

## 2019-08-20 DIAGNOSIS — F331 Major depressive disorder, recurrent, moderate: Secondary | ICD-10-CM | POA: Diagnosis not present

## 2019-08-20 DIAGNOSIS — M159 Polyosteoarthritis, unspecified: Secondary | ICD-10-CM | POA: Insufficient documentation

## 2019-08-20 DIAGNOSIS — Z7689 Persons encountering health services in other specified circumstances: Secondary | ICD-10-CM | POA: Diagnosis not present

## 2019-08-20 DIAGNOSIS — Z950 Presence of cardiac pacemaker: Secondary | ICD-10-CM

## 2019-08-20 DIAGNOSIS — Z9884 Bariatric surgery status: Secondary | ICD-10-CM | POA: Insufficient documentation

## 2019-08-20 DIAGNOSIS — I739 Peripheral vascular disease, unspecified: Secondary | ICD-10-CM

## 2019-08-20 MED ORDER — DULOXETINE HCL 30 MG PO CPEP: 30.0000 mg | ORAL_CAPSULE | Freq: Every day | ORAL | 2 refills | Status: DC

## 2019-08-20 NOTE — Assessment & Plan Note (Signed)
Chronic problem Recurrent, active moderate See PHQ Inadequately treated, not on therapy recently, multiple factors for affecting her depression see HPI  Trial on SNRI Duloxetine 30mg  daily, may help mood depression and also help improve sleep insomnia and neuropathic/chronic pain, titrate dose to 60 in future if tolerated, f/u 4 weeks Review outside record as requested see other meds tried

## 2019-08-20 NOTE — Assessment & Plan Note (Signed)
Likely contributing to some chronic joint pain See A&P

## 2019-08-20 NOTE — Assessment & Plan Note (Signed)
Chronic problem Primarily affecting her pain and sleep w insomnia On Pramipexole - will keep on current dose if helpful Add Duloxetine 30mg  daily to help, had been on Gabapentin unsure reason DC'd can f/u on this future reconsider gabapentin vs lyrica and dose adjust Duloxetine

## 2019-08-20 NOTE — Patient Instructions (Addendum)
Thank you for coming to the office today.  Referral to Cardiologist  [facility]) [facility] at [facility] [address] [place], [place] [address] Main: [phone]   -----------  Referral to Vascular doctor for varicose veins and swelling - likely cause of your left leg pain and symptoms.  [facility] [address] [place], [place] [address]  Main: [phone]   -------------  Please look up name / location / phone number for your doctors and specialist in [place], [place] - and let us know - you can bring a list of this information to the front desk and we can fax records request.  CHeck for copy of your pneumonia vaccine with date.  We do especially need the record of last Colonoscopy to have on your chart as it is required.  For mood and restless leg / pain and sleep - START Duloxetine (Cymbalta) 30mg  once daily in morning, and in future 4 weeks we can discuss again may increase dose.  DUE for FASTING BLOOD WORK (no food or drink after midnight before the lab appointment, only water or coffee without cream/sugar on the morning of)  SCHEDULE "Lab Only" visit in the morning at the clinic for lab draw in 3-4 WEEKS   - Make sure Lab Only appointment is at about 1 week before your next appointment, so that results will be available  For Lab Results, once available within 2-3 days of blood draw, you can can log in to MyChart online to view your results and a brief explanation. Also, we can discuss results at next follow-up visit.    Please schedule a Follow-up Appointment to: Return in about 4 weeks (around 09/17/2019) for 4 week follow-up DM, Leg pain / RLS, Swelling, Lab results.  If you have any other questions or concerns, please feel free to call the office or send a message through MyChart. You may also schedule an earlier appointment if necessary.  Additionally, you may be receiving a survey about your  experience at our office within a few days to 1 week by e-mail or mail. We value your feedback.  09/19/2019, DO [facility], [facility]

## 2019-08-20 NOTE — Progress Notes (Signed)
Subjective:    Patient ID: Janet [name], female    DOB: 09-26-1950, 69 y.o.   MRN: [id number]  Janet [name] is a 69 y.o. female presenting on 08/20/2019 for Establish Care (Left leg pain and numbness chronic as per pt knee surgery was not successful )  Husband Jesus Touchette, newly established here. Moved to this area for past 1 year. Previously in [place].  HPI   Left Leg Pain and Numbness / Possible peripheral neuropathy Chronic L knee pain, history of osteoarthritis, s/p prior L knee TKR surgery and repeat procedure - Prior R knee replacement in past with good results in [facility] - August 2020, left knee replacement, they did repeat knee procedure manipulation 01/2019 - Did PT, still has pain and swelling of left lower extremity  Completed PT and still continuing, with limited relief.  Varicose veins Venous Insufficiency PAD Prior history >3 years ago vascular specialist years ago, had 2 veins removed. Limited improvement. Now still has swelling and painful veins, requesting consultation  History of Bariatric Surgery - Gastric Bypass - She lost 95 lbs in 1.5 years. She has gained recently 5 lbs and has some stress eating lately. - She has history of type 2 diabetes. Her A1c has been controlled after surgery  CHRONIC DM, Type 2: Reports no concerns since bariatric surgery wt loss, major improvement Meds: Metformin 500mg  daily, likely IR Reports good compliance. Tolerating well w/o side-effects Currently on ARB Lifestyle: - Diet (improved)  Denies hypoglycemia, polyuria, visual changes, numbness or tingling.  Restless Leg Syndrome / Insomnia Chronic problem. Taking Mirapex 1mg  BID She admits difficulty sleeping at night with pain and RLS at night, and she will keep awake at night and feel tired during day and often will get depressed due to this problem. - Previously dx with depression in past, was followed by Psychiatry and was on some medication cannot recall names of meds,  >3-4 years ago, she stopped following with them.  CHRONIC HTN / s/p Pacemaker Reports home BP readings 120s/60-70s. Checks it at home occasionally Followed by [facility] for BP and Pacemaker. Current Meds - Losartan-HCTZ 100-25mg  daily, Metoprolol tartrate 25mg  nightly Reports good compliance, took meds today. Tolerating well, w/o complaints. Denies CP, dyspnea, HA, edema, dizziness / lightheadedness  Last colonoscopy EGD / Colonoscopy - [place], good for 5 years - requested record   Health Maintenance:  Followed by [facility] specialist for Breast Cancer - no dx of breast cancer. They did mammogram and it was abnormal she has upcoming repeat biopsy.  Daughter in age 39s with terminal cancer, breast spreading to bone and brain.  Depression screen PHQ 2/9 08/20/2019  Decreased Interest 1  Down, Depressed, Hopeless 1  PHQ - 2 Score 2  Altered sleeping 2  Tired, decreased energy 1  Change in appetite 1  Feeling bad or failure about yourself  1  Trouble concentrating 0  Moving slowly or fidgety/restless 1  Suicidal thoughts 0  PHQ-9 Score 8  Difficult doing work/chores Somewhat difficult    Past Medical History:  Diagnosis Date  . GERD (gastroesophageal reflux disease)   . Heart murmur   . Hyperlipidemia   . Hypertension   . PAD (peripheral artery disease) (HCC)   . Restless leg   . Sleep apnea   . Urinary incontinence    Past Surgical History:  Procedure Laterality Date  . CARPAL TUNNEL RELEASE     Left    Social History   Socioeconomic History  . Marital status:  Married    Spouse name: Not on file  . Number of children: Not on file  . Years of education: Not on file  . Highest education level: Not on file  Occupational History  . Not on file  Tobacco Use  . Smoking status: Former Research scientist (life sciences)  . Smokeless tobacco: Former Network engineer and Sexual Activity  . Alcohol use: Never  . Drug use: Never  . Sexual activity: Not on file  Other Topics Concern   . Not on file  Social History Narrative  . Not on file   Social Determinants of Health   Financial Resource Strain:   . Difficulty of Paying Living Expenses:   Food Insecurity:   . Worried About Charity fundraiser in the Last Year:   . Arboriculturist in the Last Year:   Transportation Needs:   . Film/video editor (Medical):   Marland Kitchen Lack of Transportation (Non-Medical):   Physical Activity:   . Days of Exercise per Week:   . Minutes of Exercise per Session:   Stress:   . Feeling of Stress :   Social Connections:   . Frequency of Communication with Friends and Family:   . Frequency of Social Gatherings with Friends and Family:   . Attends Religious Services:   . Active Member of Clubs or Organizations:   . Attends Archivist Meetings:   Marland Kitchen Marital Status:   Intimate Partner Violence:   . Fear of Current or Ex-Partner:   . Emotionally Abused:   Marland Kitchen Physically Abused:   . Sexually Abused:    Family History  Problem Relation Age of Onset  . Heart disease Father   . Cancer Father        skin  . Thyroid disease Father    Current Outpatient Medications on File Prior to Visit  Medication Sig  . aspirin 81 MG EC tablet Take by mouth.  Marland Kitchen atorvastatin (LIPITOR) 40 MG tablet Take 40 mg by mouth at bedtime.  Marland Kitchen losartan-hydrochlorothiazide (HYZAAR) 100-25 MG tablet Take 1 tablet by mouth daily.  . metFORMIN (GLUCOPHAGE) 500 MG tablet metformin 500 mg tablet  . metoprolol tartrate (LOPRESSOR) 25 MG tablet Take 25 mg by mouth at bedtime.  . pramipexole (MIRAPEX) 1 MG tablet Take 1 tablet (1 mg total) by mouth in the morning and at bedtime.   No current facility-administered medications on file prior to visit.    Review of Systems Per HPI unless specifically indicated above      Objective:    BP 116/68   Pulse 77   Temp (!) 97.5 F (36.4 C) (Temporal)   Resp 16   Ht 5\' 2"  (1.575 m)   Wt 200 lb 9.6 oz (91 kg)   SpO2 98%   BMI 36.69 kg/m   Wt Readings from  Last 3 Encounters:  08/20/19 200 lb 9.6 oz (91 kg)    Physical Exam Vitals and nursing note reviewed.  Constitutional:      General: She is not in acute distress.    Appearance: She is well-developed. She is obese. She is not diaphoretic.     Comments: Well-appearing, comfortable, cooperative  HENT:     Head: Normocephalic and atraumatic.  Eyes:     General:        Right eye: No discharge.        Left eye: No discharge.     Conjunctiva/sclera: Conjunctivae normal.  Neck:     Thyroid: No thyromegaly.  Cardiovascular:     Rate and Rhythm: Normal rate and regular rhythm.     Heart sounds: Normal heart sounds. No murmur heard.   Pulmonary:     Effort: Pulmonary effort is normal. No respiratory distress.     Breath sounds: Normal breath sounds. No wheezing or rales.  Musculoskeletal:        General: Normal range of motion.     Cervical back: Normal range of motion and neck supple.     Right lower leg: Edema (+1) present.     Left lower leg: Edema (+2) present.  Lymphadenopathy:     Cervical: No cervical adenopathy.  Skin:    General: Skin is warm and dry.     Findings: No erythema or rash.     Comments: Stasis dermatitis bilateral lower extremity ankles leg with some discoloration, varicose veins and spider veins  Neurological:     Mental Status: She is alert and oriented to person, place, and time.  Psychiatric:        Behavior: Behavior normal.     Comments: Well groomed, good eye contact, normal speech and thoughts      Diabetic Foot Exam - Simple   Simple Foot Form Diabetic Foot exam was performed with the following findings: Yes 08/20/2019 11:12 AM  Visual Inspection See comments: Yes Sensation Testing Intact to touch and monofilament testing bilaterally: Yes Pulse Check Posterior Tibialis and Dorsalis pulse intact bilaterally: Yes Comments Varicose veins bilateral feet/ankles, some residual edema. No ulceration. Mild callus formation bilateral heels. No deformity  except some toes with evidence of arthritis.      No results found for this or any previous visit.    Assessment & Plan:   Problem List Items Addressed This Visit    Venous insufficiency of both lower extremities   Relevant Medications   losartan-hydrochlorothiazide (HYZAAR) 100-25 MG tablet   aspirin 81 MG EC tablet   atorvastatin (LIPITOR) 40 MG tablet   metoprolol tartrate (LOPRESSOR) 25 MG tablet   Other Relevant Orders   Ambulatory referral to Vascular Surgery   Type 2 diabetes mellitus with other specified complication (HCC) - Primary    Well-controlled DM with A1c previous 5-6 range, since bariatric surgery wt loss Complications - peripheral neuropathy, hyperlipidemia, depression, obesity, OSA  increases risk of future cardiovascular complications   Plan:  1. Continue current therapy - Metformin 500mg  daily, advised can switch to XR daily, on minimal medication /sp bariatric surgery 2. Encourage improved lifestyle - low carb, low sugar diet, reduce portion size, continue improving regular exercise 3. Check CBG , bring log to next visit for review 4. Continue ARB, Statin 5. DM Foot exam done today / Advised to schedule DM ophtho exam, send record      Relevant Medications   losartan-hydrochlorothiazide (HYZAAR) 100-25 MG tablet   metFORMIN (GLUCOPHAGE) 500 MG tablet   aspirin 81 MG EC tablet   atorvastatin (LIPITOR) 40 MG tablet   Symptomatic varicose veins, bilateral   Relevant Medications   losartan-hydrochlorothiazide (HYZAAR) 100-25 MG tablet   aspirin 81 MG EC tablet   atorvastatin (LIPITOR) 40 MG tablet   metoprolol tartrate (LOPRESSOR) 25 MG tablet   Other Relevant Orders   Ambulatory referral to Vascular Surgery   Restless leg syndrome    Chronic problem Primarily affecting her pain and sleep w insomnia On Pramipexole - will keep on current dose if helpful Add Duloxetine 30mg  daily to help, had been on Gabapentin unsure reason DC'd can f/u on this  future  reconsider gabapentin vs lyrica and dose adjust Duloxetine      Relevant Medications   pramipexole (MIRAPEX) 1 MG tablet   DULoxetine (CYMBALTA) 30 MG capsule   Primary osteoarthritis involving multiple joints    Likely contributing to some chronic joint pain See A&P      Relevant Medications   aspirin 81 MG EC tablet   PAD (peripheral artery disease) (HCC)   Relevant Medications   losartan-hydrochlorothiazide (HYZAAR) 100-25 MG tablet   aspirin 81 MG EC tablet   atorvastatin (LIPITOR) 40 MG tablet   metoprolol tartrate (LOPRESSOR) 25 MG tablet   Other Relevant Orders   Ambulatory referral to Cardiology   Ambulatory referral to Vascular Surgery   Other insomnia    See A&P Depression SNRI may help improve      Relevant Medications   DULoxetine (CYMBALTA) 30 MG capsule   Major depressive disorder, recurrent, moderate (HCC)    Chronic problem Recurrent, active moderate See PHQ Inadequately treated, not on therapy recently, multiple factors for affecting her depression see HPI  Trial on SNRI Duloxetine 30mg  daily, may help mood depression and also help improve sleep insomnia and neuropathic/chronic pain, titrate dose to 60 in future if tolerated, f/u 4 weeks Review outside record as requested see other meds tried      Relevant Medications   DULoxetine (CYMBALTA) 30 MG capsule   Edema of left lower extremity   Relevant Orders   Ambulatory referral to Vascular Surgery    Other Visit Diagnoses    Encounter to establish care with new doctor       Cardiac pacemaker       Relevant Orders   Ambulatory referral to Cardiology      Request outside records from prior providers, she will bring list of doctors and locations/phone number  Will request records today from Optometry for DM Eye exam.  #Cardiology S/p Pacemaker, PAD HTN, cardiovascular risk factors DM2, HLD Prior cardiology [facility] health in [place], then prior to that in [place] She is currently doing well, will  refer to local Cardiology for pacemaker management and checks.  #Vascular Lower Extremity Pain and Swelling / L>R, Varicose veins and insufficiency Has chronic Left sided thigh and leg pain with significant concerns from varicose veins causing pain, and has some neuropathic related pain associated. - history of vein stripping procedure prior to 2019 - Also associated RLS symptoms see above - On thiazide - RICE therapy - Trial on Duloxetine for neuropathic pain  Refer to Vascular - [facility] for further management of varicose veins pain/swelling  Orders Placed This Encounter  Procedures  . Ambulatory referral to Cardiology    Referral Priority:   Routine    Referral Type:   Consultation    Referral Reason:   Specialty Services Required    Requested Specialty:   Cardiology    Number of Visits Requested:   1  . Ambulatory referral to Vascular Surgery    Referral Priority:   Routine    Referral Type:   Surgical    Referral Reason:   Specialty Services Required    Requested Specialty:   Vascular Surgery    Number of Visits Requested:   1     Meds ordered this encounter  Medications  . DULoxetine (CYMBALTA) 30 MG capsule    Sig: Take 1 capsule (30 mg total) by mouth daily.    Dispense:  30 capsule    Refill:  2     Follow up  plan: Return in about 4 weeks (around 09/17/2019) for 4 week follow-up DM, Leg pain / RLS, Swelling, Lab results.  Future labs ordered 09/12/19  Saralyn PilarAlexander Antoni Stefan, DO [facility] 08/20/2019, 10:34 AM

## 2019-08-20 NOTE — Assessment & Plan Note (Signed)
See A&P Depression SNRI may help improve

## 2019-08-20 NOTE — Assessment & Plan Note (Signed)
Well-controlled DM with A1c previous 5-6 range, since bariatric surgery wt loss Complications - peripheral neuropathy, hyperlipidemia, depression, obesity, OSA  increases risk of future cardiovascular complications   Plan:  1. Continue current therapy - Metformin 500mg  daily, advised can switch to XR daily, on minimal medication /sp bariatric surgery 2. Encourage improved lifestyle - low carb, low sugar diet, reduce portion size, continue improving regular exercise 3. Check CBG , bring log to next visit for review 4. Continue ARB, Statin 5. DM Foot exam done today / Advised to schedule DM ophtho exam, send record

## 2019-08-25 ENCOUNTER — Encounter: Payer: Self-pay | Admitting: Family Medicine

## 2019-08-30 DIAGNOSIS — Z950 Presence of cardiac pacemaker: Secondary | ICD-10-CM | POA: Diagnosis not present

## 2019-08-30 DIAGNOSIS — R920 Mammographic microcalcification found on diagnostic imaging of breast: Secondary | ICD-10-CM | POA: Diagnosis not present

## 2019-08-30 DIAGNOSIS — R921 Mammographic calcification found on diagnostic imaging of breast: Secondary | ICD-10-CM | POA: Diagnosis not present

## 2019-09-12 ENCOUNTER — Other Ambulatory Visit: Payer: Self-pay

## 2019-09-12 DIAGNOSIS — I872 Venous insufficiency (chronic) (peripheral): Secondary | ICD-10-CM | POA: Diagnosis not present

## 2019-09-12 DIAGNOSIS — E785 Hyperlipidemia, unspecified: Secondary | ICD-10-CM | POA: Diagnosis not present

## 2019-09-12 DIAGNOSIS — F331 Major depressive disorder, recurrent, moderate: Secondary | ICD-10-CM | POA: Diagnosis not present

## 2019-09-12 DIAGNOSIS — E1169 Type 2 diabetes mellitus with other specified complication: Secondary | ICD-10-CM | POA: Diagnosis not present

## 2019-09-13 LAB — COMPLETE METABOLIC PANEL WITH GFR
AG Ratio: 1.6 (calc) (ref 1.0–2.5)
ALT: 17 U/L (ref 6–29)
AST: 17 U/L (ref 10–35)
Albumin: 4.1 g/dL (ref 3.6–5.1)
Alkaline phosphatase (APISO): 98 U/L (ref 37–153)
BUN: 19 mg/dL (ref 7–25)
CO2: 31 mmol/L (ref 20–32)
Calcium: 9.4 mg/dL (ref 8.6–10.4)
Chloride: 102 mmol/L (ref 98–110)
Creat: 0.65 mg/dL (ref 0.50–0.99)
GFR, Est African American: 107 mL/min/{1.73_m2} (ref >=60)
GFR, Est Non African American: 93 mL/min/{1.73_m2} (ref >=60)
Globulin: 2.5 g/dL (calc) (ref 1.9–3.7)
Glucose, Bld: 95 mg/dL (ref 65–99)
Potassium: 4.1 mmol/L (ref 3.5–5.3)
Sodium: 140 mmol/L (ref 135–146)
Total Bilirubin: 1.2 mg/dL (ref 0.2–1.2)
Total Protein: 6.6 g/dL (ref 6.1–8.1)

## 2019-09-13 LAB — CBC WITH DIFFERENTIAL/PLATELET
Absolute Monocytes: 562 cells/uL (ref 200–950)
Basophils Absolute: 30 cells/uL (ref 0–200)
Basophils Relative: 0.4 %
Eosinophils Absolute: 289 cells/uL (ref 15–500)
Eosinophils Relative: 3.8 %
HCT: 42.7 % (ref 35.0–45.0)
Hemoglobin: 13.7 g/dL (ref 11.7–15.5)
Lymphs Abs: 2219 cells/uL (ref 850–3900)
MCH: 30.7 pg (ref 27.0–33.0)
MCHC: 32.1 g/dL (ref 32.0–36.0)
MCV: 95.7 fL (ref 80.0–100.0)
MPV: 11.5 fL (ref 7.5–12.5)
Monocytes Relative: 7.4 %
Neutro Abs: 4499 cells/uL (ref 1500–7800)
Neutrophils Relative %: 59.2 %
Platelets: 265 10*3/uL (ref 140–400)
RBC: 4.46 10*6/uL (ref 3.80–5.10)
RDW: 12.7 % (ref 11.0–15.0)
Total Lymphocyte: 29.2 %
WBC: 7.6 10*3/uL (ref 3.8–10.8)

## 2019-09-13 LAB — TSH: TSH: 1.69 mIU/L (ref 0.40–4.50)

## 2019-09-13 LAB — LIPID PANEL
Cholesterol: 139 mg/dL (ref <200)
HDL: 56 mg/dL (ref >=50)
LDL Cholesterol (Calc): 68 mg/dL (calc)
Non-HDL Cholesterol (Calc): 83 mg/dL (calc) (ref <130)
Total CHOL/HDL Ratio: 2.5 (calc) (ref <5.0)
Triglycerides: 70 mg/dL (ref <150)

## 2019-09-13 LAB — HEMOGLOBIN A1C
Hgb A1c MFr Bld: 5.8 % of total Hgb — ABNORMAL HIGH (ref <5.7)
Mean Plasma Glucose: 120 (calc)
eAG (mmol/L): 6.6 (calc)

## 2019-09-16 ENCOUNTER — Encounter (INDEPENDENT_AMBULATORY_CARE_PROVIDER_SITE_OTHER): Payer: Self-pay

## 2019-09-16 ENCOUNTER — Other Ambulatory Visit: Payer: Self-pay

## 2019-09-16 ENCOUNTER — Encounter (INDEPENDENT_AMBULATORY_CARE_PROVIDER_SITE_OTHER): Payer: Self-pay | Admitting: Nurse Practitioner

## 2019-09-16 ENCOUNTER — Ambulatory Visit (INDEPENDENT_AMBULATORY_CARE_PROVIDER_SITE_OTHER): Payer: Medicare Other | Admitting: Nurse Practitioner

## 2019-09-16 VITALS — BP 137/80 | HR 73 | Resp 17 | Ht 62.0 in | Wt 200.0 lb

## 2019-09-16 DIAGNOSIS — E1169 Type 2 diabetes mellitus with other specified complication: Secondary | ICD-10-CM

## 2019-09-16 DIAGNOSIS — I872 Venous insufficiency (chronic) (peripheral): Secondary | ICD-10-CM

## 2019-09-16 DIAGNOSIS — Z86718 Personal history of other venous thrombosis and embolism: Secondary | ICD-10-CM | POA: Insufficient documentation

## 2019-09-16 DIAGNOSIS — M1712 Unilateral primary osteoarthritis, left knee: Secondary | ICD-10-CM | POA: Diagnosis not present

## 2019-09-17 ENCOUNTER — Encounter (INDEPENDENT_AMBULATORY_CARE_PROVIDER_SITE_OTHER): Payer: Self-pay | Admitting: Nurse Practitioner

## 2019-09-17 NOTE — Progress Notes (Signed)
Subjective:    Patient ID: Janet [name], female    DOB: September 09, 1950, 69 y.o.   MRN: [id number] Chief Complaint  Patient presents with  . New Patient (Initial Visit)    varicose veins    The patient is seen for evaluation of symptomatic varicose veins. The patient relates burning and stinging which worsened steadily throughout the course of the day, particularly with standing. The patient also notes an aching and throbbing pain over the varicosities, particularly with prolonged dependent positions. The symptoms are significantly improved with elevation.  The patient also notes that during hot weather the symptoms are greatly intensified. The patient states the pain from the varicose veins interferes with work, daily exercise, shopping and household maintenance. At this point, the symptoms are persistent and severe enough that they're having a negative impact on lifestyle and are interfering with daily activities.  The patient does have a previous history of having her veins treated approximately 8 to 10 years ago.  The patient believes that it was an endovenous laser ablation.  However despite treatment, she continues to have pain and tenderness within her varicose veins.  There is no history of DVT, PE or superficial thrombophlebitis. There is no history of ulceration or hemorrhage. The patient denies a significant family history of varicose veins.   The patient has to need to wear graduated compression . At the present time the patient been using over-the-counter analgesics.    Review of Systems  Cardiovascular: Positive for leg swelling.       Scattered varicosities  All other systems reviewed and are negative.      Objective:   Physical Exam Vitals reviewed.  HENT:     Head: Normocephalic.  Cardiovascular:     Rate and Rhythm: Normal rate and regular rhythm.     Pulses: Normal pulses.     Heart sounds: Normal heart sounds.  Pulmonary:     Effort: Pulmonary effort is normal.      Breath sounds: Normal breath sounds.  Skin:    General: Skin is warm and dry.  Neurological:     Mental Status: She is alert and oriented to person, place, and time.  Psychiatric:        Mood and Affect: Mood normal.        Behavior: Behavior normal.        Thought Content: Thought content normal.        Judgment: Judgment normal.     BP 137/80 (BP Location: Right Arm)   Pulse 73   Resp 17   Ht 5\' 2"  (1.575 m)   Wt 200 lb (90.7 kg)   BMI 36.58 kg/m   Past Medical History:  Diagnosis Date  . GERD (gastroesophageal reflux disease)   . Heart murmur   . Hyperlipidemia   . Hypertension   . PAD (peripheral artery disease) (HCC)   . Restless leg   . Sleep apnea   . Urinary incontinence     Social History   Socioeconomic History  . Marital status: Married    Spouse name: Not on file  . Number of children: Not on file  . Years of education: Not on file  . Highest education level: Not on file  Occupational History  . Not on file  Tobacco Use  . Smoking status: Former Smoker    Packs/day: 1.00    Years: 25.00    Pack years: 25.00    Types: Cigarettes  . Smokeless tobacco: Former and  Sexual Activity  . Alcohol use: Never  . Drug use: Never  . Sexual activity: Not on file  Other Topics Concern  . Not on file  Social History Narrative  . Not on file   Social Determinants of Health   Financial Resource Strain:   . Difficulty of Paying Living Expenses:   Food Insecurity:   . Worried About Programme researcher, broadcasting/film/video in the Last Year:   . Barista in the Last Year:   Transportation Needs:   . Freight forwarder (Medical):   Marland Kitchen Lack of Transportation (Non-Medical):   Physical Activity:   . Days of Exercise per Week:   . Minutes of Exercise per Session:   Stress:   . Feeling of Stress :   Social Connections:   . Frequency of Communication with Friends and Family:   . Frequency of Social Gatherings with Friends and Family:   . Attends  Religious Services:   . Active Member of Clubs or Organizations:   . Attends Banker Meetings:   Marland Kitchen Marital Status:   Intimate Partner Violence:   . Fear of Current or Ex-Partner:   . Emotionally Abused:   Marland Kitchen Physically Abused:   . Sexually Abused:     Past Surgical History:  Procedure Laterality Date  . APPENDECTOMY  2014  . CARPAL TUNNEL RELEASE Left 1998  . DG C-ARM 2 VIEW LEFT SHOULDER ([facility] HX)    . DG C-ARM 2 VIEW RIGHT SHOULDER ([facility] HX)    . ESOPHAGEAL DILATION  2012   2015, 2017, 2020  . GASTRIC BYPASS  2019  . HERNIA REPAIR  2019  . left knee manipulation Left 02/05/2019  . PACEMAKER IMPLANT  2017  . POLYPECTOMY    . SHOULDER SURGERY Left 2014  . SHOULDER SURGERY Right 2017  . TOTAL KNEE ARTHROPLASTY Right 2018  . TOTAL KNEE ARTHROPLASTY Left 10/18/2018  . vein removal Bilateral 2012    Family History  Problem Relation Age of Onset  . Heart attack Mother 21  . Heart disease Father   . Thyroid disease Father   . Skin cancer Father   . Brain cancer Maternal Grandmother     Not on File     Assessment & Plan:   1. Type 2 diabetes mellitus with other specified complication, without long-term current use of insulin (HCC) Continue hypoglycemic medications as already ordered, these medications have been reviewed and there are no changes at this time.  Hgb A1C to be monitored as already arranged by primary service   2. Venous insufficiency of both lower extremities  Recommend:  The patient has large symptomatic varicose veins that are painful and associated with swelling.  I have had a long discussion with the patient regarding  varicose veins and why they cause symptoms.  Patient will continue wearing graduated compression stockings class 1 on a daily basis, beginning first thing in the morning and removing them in the evening. The patient is instructed specifically not to sleep in the stockings.    The patient  will also continue using  over-the-counter analgesics such as Motrin 600 mg po TID to help control the symptoms.    In addition, behavioral modification including elevation during the day will be continued.    An  ultrasound of the venous system will be obtained.   Further plans will be based on the ultrasound results and whether conservative therapies are successful at eliminating the pain and swelling.   3. Primary  osteoarthritis of left knee Continue NSAID medications as already ordered, these medications have been reviewed and there are no changes at this time.  Continued activity and therapy was stressed.    Current Outpatient Medications on File Prior to Visit  Medication Sig Dispense Refill  . aspirin 81 MG EC tablet Take by mouth.    Marland Kitchen atorvastatin (LIPITOR) 40 MG tablet Take 40 mg by mouth at bedtime.    . DULoxetine (CYMBALTA) 30 MG capsule Take 1 capsule (30 mg total) by mouth daily. 30 capsule 2  . losartan-hydrochlorothiazide (HYZAAR) 100-25 MG tablet Take 1 tablet by mouth daily.    . metFORMIN (GLUCOPHAGE) 500 MG tablet metformin 500 mg tablet    . metoprolol tartrate (LOPRESSOR) 25 MG tablet Take 25 mg by mouth at bedtime.    . pramipexole (MIRAPEX) 1 MG tablet Take 1 tablet (1 mg total) by mouth in the morning and at bedtime. 180 tablet 1   No current facility-administered medications on file prior to visit.    There are no Patient Instructions on file for this visit. No follow-ups on file.   Georgiana Spinner, NP

## 2019-09-19 ENCOUNTER — Other Ambulatory Visit: Payer: Self-pay

## 2019-09-19 ENCOUNTER — Encounter: Payer: Self-pay | Admitting: Family Medicine

## 2019-09-19 ENCOUNTER — Ambulatory Visit (INDEPENDENT_AMBULATORY_CARE_PROVIDER_SITE_OTHER): Payer: Medicare Other | Admitting: Family Medicine

## 2019-09-19 VITALS — BP 120/68 | HR 69 | Temp 97.5°F | Resp 16 | Ht 62.0 in | Wt 201.6 lb

## 2019-09-19 DIAGNOSIS — E1169 Type 2 diabetes mellitus with other specified complication: Secondary | ICD-10-CM

## 2019-09-19 DIAGNOSIS — G4709 Other insomnia: Secondary | ICD-10-CM

## 2019-09-19 DIAGNOSIS — F331 Major depressive disorder, recurrent, moderate: Secondary | ICD-10-CM

## 2019-09-19 DIAGNOSIS — G2581 Restless legs syndrome: Secondary | ICD-10-CM | POA: Diagnosis not present

## 2019-09-19 DIAGNOSIS — F3342 Major depressive disorder, recurrent, in full remission: Secondary | ICD-10-CM

## 2019-09-19 DIAGNOSIS — Z23 Encounter for immunization: Secondary | ICD-10-CM

## 2019-09-19 MED ORDER — DULOXETINE HCL 30 MG PO CPEP: 30.0000 mg | ORAL_CAPSULE | Freq: Every day | ORAL | 1 refills | Status: DC

## 2019-09-19 NOTE — Assessment & Plan Note (Signed)
Well-controlled DM with A1c 5.8 now S/p bariatric surgery wt loss Complications - peripheral neuropathy, hyperlipidemia, depression, obesity, OSA  increases risk of future cardiovascular complications   Plan:  1. Continue current therapy - Metformin 500mg  daily, future we can switch to XR daily, on minimal medication /sp bariatric surgery 2. Encourage improved lifestyle - low carb, low sugar diet, reduce portion size, continue improving regular exercise 3. Check CBG , bring log to next visit for review 4. Continue ARB, Statin 5. Request record [facility] DM Eye 3-05/2019

## 2019-09-19 NOTE — Assessment & Plan Note (Addendum)
Morbid Obesity BMI >36 with co morbid factors - Type 2 Diabetes, Depression, Hyperlipidemia Stable weight now Encourage lifestyle diet improve exercise activity

## 2019-09-19 NOTE — Assessment & Plan Note (Signed)
Chronic problem Recurrent now in remission See PHQ Improved on SNRI Duloxetine 30mg, consider dose adjust for mood/sleep or pain 

## 2019-09-19 NOTE — Progress Notes (Signed)
Subjective:    Patient ID: Janet [name], female    DOB: January 16, 1951, 69 y.o.   MRN: [id number]  Janet [name] is a 69 y.o. female presenting on 09/19/2019 for RLS (follow up after lab work)   HPI  Left Leg Pain and Numbness / Possible peripheral neuropathy Chronic L knee pain, history of osteoarthritis, s/p prior L knee TKR surgery and repeat procedure - [facility] [place] [facility] [place] [facility], left knee replacement, they did repeat knee procedure manipulation 01/2019 - Did PT, still has pain and swelling of left lower extremity  Completed PT and still continuing, with limited relief. Today admits has persistent Left lateral thigh area of numbness. Some improved on Duloxetine.  Varicose veins Venous Insufficiency PAD Prior history >3 years ago vascular specialist years ago, had 2 veins removed. Limited improvement. She has seen [facility]. They discussed options and will pursue US imaging.  History of Bariatric Surgery - Gastric Bypass - She lost 95 lbs in 1.5 years - She has history of type 2 diabetes. Her A1c has been controlled after surgery  CHRONIC DM, Type 2: Reports no concerns since bariatric surgery wt loss, major improvement A1c recently was 5.8, well controlled Meds: Metformin 500mg  daily, IR Reports good compliance. Tolerating well w/o side-effects Currently on ARB Lifestyle: - Diet (improved)  She had DM Eye exam in [facility] [place] - will request record. Denies hypoglycemia, polyuria, visual changes, numbness or tingling.  Restless Leg Syndrome / Insomnia Major Depression recurrent in remission  Chronic problem. Taking Mirapex 1mg  BID She admits difficulty sleeping at night with pain and RLS at night, and she will keep awake at night and feel tired during day - Previously dx with depression in past, was followed by Psychiatry and was on some medication cannot recall names of meds, >3-4 years ago, she stopped  following with them. - Today she states mood is improved - Last visit was started on Duloxetine 30mg  daily. She says is helping mood, and also seems to help some neuropathy pain symptoms. But not resolve. She was on Gabapentin in the past.  CHRONIC HTN / s/p Pacemaker Reports home BP readings 120s/60-70s. Checks it at home occasionally [facility] and Pacemaker. Current Meds - Losartan-HCTZ 100-25mg  daily, Metoprolol tartrate 25mg  nightly Reports good compliance, took meds today. Tolerating well, w/o complaints. Denies CP, dyspnea, HA, edema, dizziness / lightheadedness    Health Maintenance:  Followed by [facility] for Breast Cancer - no dx of breast cancer. They did mammogram and it was abnormal she has upcoming repeat biopsy.  Due for 2nd pneumonia vaccine >1 year after previous vaccine, now to receive Pneumovax-23 today   Depression screen [facility] 2/9 09/19/2019 08/20/2019  Decreased Interest 0 1  Down, Depressed, Hopeless 0 1  PHQ - 2 Score 0 2  Altered sleeping 0 2  Tired, decreased energy 0 1  Change in appetite 0 1  Feeling bad or failure about yourself  0 1  Trouble concentrating 0 0  Moving slowly or fidgety/restless 0 1  Suicidal thoughts 0 0  PHQ-9 Score 0 8  Difficult doing work/chores Not difficult at all Somewhat difficult    Social History   Tobacco Use  . Smoking status: Former Smoker    Packs/day: 1.00    Years: 25.00    Pack years: 25.00    Types: Cigarettes  . Smokeless tobacco: Former Engineer, waterUser  Substance Use Topics  .  Alcohol use: Never  . Drug use: Never    Review of Systems Per HPI unless specifically indicated above     Objective:    BP 120/68   Pulse 69   Temp (!) 97.5 F (36.4 C) (Temporal)   Resp 16   Ht 5\' 2"  (1.575 m)   Wt 201 lb 9.6 oz (91.4 kg)   SpO2 98%   BMI 36.87 kg/m   Wt Readings from Last 3 Encounters:  09/19/19 201 lb 9.6 oz (91.4 kg)  09/16/19 200 lb (90.7 kg)  08/20/19 200 lb 9.6  oz (91 kg)    Physical Exam Vitals and nursing note reviewed.  Constitutional:      General: She is not in acute distress.    Appearance: She is well-developed. She is obese. She is not diaphoretic.     Comments: Well-appearing, comfortable, cooperative  HENT:     Head: Normocephalic and atraumatic.  Eyes:     General:        Right eye: No discharge.        Left eye: No discharge.     Conjunctiva/sclera: Conjunctivae normal.  Neck:     Thyroid: No thyromegaly.  Cardiovascular:     Rate and Rhythm: Normal rate and regular rhythm.     Heart sounds: Normal heart sounds. No murmur heard.   Pulmonary:     Effort: Pulmonary effort is normal. No respiratory distress.     Breath sounds: Normal breath sounds. No wheezing or rales.  Musculoskeletal:        General: Normal range of motion.     Cervical back: Normal range of motion and neck supple.     Right lower leg: Edema (+1) present.     Left lower leg: Edema (+2) present.  Lymphadenopathy:     Cervical: No cervical adenopathy.  Skin:    General: Skin is warm and dry.     Findings: No erythema or rash.     Comments: Stasis dermatitis bilateral lower extremity ankles leg with some discoloration, varicose veins and spider veins  Neurological:     Mental Status: She is alert and oriented to person, place, and time.     Sensory: Sensory deficit (left lateral outer thigh) present.  Psychiatric:        Behavior: Behavior normal.     Comments: Well groomed, good eye contact, normal speech and thoughts      Recent Labs    10/12/18 0000 09/12/19 0803  HGBA1C 5.5 5.8*     Results for orders placed or performed in visit on 09/12/19  TSH  Result Value Ref Range   TSH 1.69 0.40 - 4.50 mIU/L  Lipid panel  Result Value Ref Range   Cholesterol 139 <200 mg/dL   HDL 56 > OR = 50 mg/dL   Triglycerides 70 09/14/19 mg/dL   LDL Cholesterol (Calc) 68 mg/dL (calc)   Total CHOL/HDL Ratio 2.5 <5.0 (calc)   Non-HDL Cholesterol (Calc) 83 <093  mg/dL (calc)  COMPLETE METABOLIC PANEL WITH GFR  Result Value Ref Range   Glucose, Bld 95 65 - 99 mg/dL   BUN 19 7 - 25 mg/dL   Creat <235 5.73 - 2.20 mg/dL   GFR, Est Non African American 93 > OR = 60 mL/min/1.61m2   GFR, Est African American 107 > OR = 60 mL/min/1.71m2   BUN/Creatinine Ratio NOT APPLICABLE 6 - 22 (calc)   Sodium 140 135 - 146 mmol/L   Potassium 4.1 3.5 - 5.3 mmol/L  Chloride 102 98 - 110 mmol/L   CO2 31 20 - 32 mmol/L   Calcium 9.4 8.6 - 10.4 mg/dL   Total Protein 6.6 6.1 - 8.1 g/dL   Albumin 4.1 3.6 - 5.1 g/dL   Globulin 2.5 1.9 - 3.7 g/dL (calc)   AG Ratio 1.6 1.0 - 2.5 (calc)   Total Bilirubin 1.2 0.2 - 1.2 mg/dL   Alkaline phosphatase (APISO) 98 37 - 153 U/L   AST 17 10 - 35 U/L   ALT 17 6 - 29 U/L  CBC with Differential/Platelet  Result Value Ref Range   WBC 7.6 3.8 - 10.8 Thousand/uL   RBC 4.46 3.80 - 5.10 Million/uL   Hemoglobin 13.7 11.7 - 15.5 g/dL   HCT 45.6 35 - 45 %   MCV 95.7 80.0 - 100.0 fL   MCH 30.7 27.0 - 33.0 pg   MCHC 32.1 32.0 - 36.0 g/dL   RDW 25.6 38.9 - 37.3 %   Platelets 265 140 - 400 Thousand/uL   MPV 11.5 7.5 - 12.5 fL   Neutro Abs 4,499 1,500 - 7,800 cells/uL   Lymphs Abs 2,219 850 - 3,900 cells/uL   Absolute Monocytes 562 200 - 950 cells/uL   Eosinophils Absolute 289 15 - 500 cells/uL   Basophils Absolute 30 0 - 200 cells/uL   Neutrophils Relative % 59.2 %   Total Lymphocyte 29.2 %   Monocytes Relative 7.4 %   Eosinophils Relative 3.8 %   Basophils Relative 0.4 %  Hemoglobin A1c  Result Value Ref Range   Hgb A1c MFr Bld 5.8 (H) <5.7 % of total Hgb   Mean Plasma Glucose 120 (calc)   eAG (mmol/L) 6.6 (calc)      Assessment & Plan:   Problem List Items Addressed This Visit    Type 2 diabetes mellitus with other specified complication (HCC) - Primary    Well-controlled DM with A1c 5.8 now S/p bariatric surgery wt loss Complications - peripheral neuropathy, hyperlipidemia, depression, obesity, OSA  increases risk of  future cardiovascular complications   Plan:  1. Continue current therapy - Metformin 500mg  daily, future we can switch to XR daily, on minimal medication /sp bariatric surgery 2. Encourage improved lifestyle - low carb, low sugar diet, reduce portion size, continue improving regular exercise 3. Check CBG , bring log to next visit for review 4. Continue ARB, Statin 5. Request record  DM Eye 3-05/2019      Restless leg syndrome    Chronic problem Primarily affecting her pain and sleep w insomnia On Pramipexole - will keep on current dose if helpful Continue Duloxetine 30mg  daily - some relief, may dose adjust to 60 vs add back Gabapentin - f/u at next visit      Relevant Medications   DULoxetine (CYMBALTA) 30 MG capsule   Other insomnia    See A&P Depression Continue SNRI      Relevant Medications   DULoxetine (CYMBALTA) 30 MG capsule   Morbid obesity (HCC)    Morbid Obesity BMI >36 with co morbid factors - Type 2 Diabetes, Depression, Hyperlipidemia Stable weight now Encourage lifestyle diet improve exercise activity      Major depression, recurrent, full remission (HCC)    Chronic problem Recurrent now in remission See PHQ Improved on SNRI Duloxetine 30mg , consider dose adjust for mood/sleep or pain      Relevant Medications   DULoxetine (CYMBALTA) 30 MG capsule    Other Visit Diagnoses    Need for 23-polyvalent  pneumococcal polysaccharide vaccine       Relevant Orders   Pneumococcal polysaccharide vaccine 23-valent greater than or equal to 2yo subcutaneous/IM (Completed)      Request [facility] DM Eye exam March/April 2021  Meds ordered this encounter  Medications  . DULoxetine (CYMBALTA) 30 MG capsule    Sig: Take 1 capsule (30 mg total) by mouth daily.    Dispense:  90 capsule    Refill:  1    Switch to 90 day supply      Follow up plan: Return in about 4 months (around 01/20/2020) for 4 month DM A1c, RLS neuropathy,  mood PHQ.   Saralyn Pilar, DO [facility] 09/19/2019, 9:49 AM

## 2019-09-19 NOTE — Assessment & Plan Note (Signed)
Chronic problem Primarily affecting her pain and sleep w insomnia On Pramipexole - will keep on current dose if helpful Continue Duloxetine 30mg  daily - some relief, may dose adjust to 60 vs add back Gabapentin - f/u at next visit

## 2019-09-19 NOTE — Assessment & Plan Note (Signed)
See A&P Depression Continue SNRI 

## 2019-09-19 NOTE — Patient Instructions (Addendum)
Thank you for coming to the office today.  1. Chemistry - Normal results, including electrolytes, kidney and liver function. Normal fasting blood sugar   2. Hemoglobin A1c (Diabetes) - 5.8, controlled diabetes.  3. TSH Thyroid Function Tests - Normal.  4. Cholesterol - Controlled Cholesterol on Atorvastatin 40mg .  5. CBC Blood Counts - Normal, no anemia, other abnormality  Please schedule a Follow-up Appointment to: Return in about 4 months (around 01/20/2020) for 4 month DM A1c, RLS neuropathy, mood PHQ.  If you have any other questions or concerns, please feel free to call the office or send a message through MyChart. You may also schedule an earlier appointment if necessary.  Additionally, you may be receiving a survey about your experience at our office within a few days to 1 week by e-mail or mail. We value your feedback.  01/22/2020, DO [facility], [facility]

## 2019-09-24 ENCOUNTER — Encounter: Payer: Self-pay | Admitting: Family Medicine

## 2019-10-10 ENCOUNTER — Ambulatory Visit (INDEPENDENT_AMBULATORY_CARE_PROVIDER_SITE_OTHER): Payer: Medicare Other | Admitting: Vascular Surgery

## 2019-10-10 ENCOUNTER — Encounter (INDEPENDENT_AMBULATORY_CARE_PROVIDER_SITE_OTHER): Payer: Medicare Other

## 2019-10-24 ENCOUNTER — Ambulatory Visit: Payer: Medicare Other | Admitting: Internal Medicine

## 2020-01-02 ENCOUNTER — Other Ambulatory Visit: Payer: Self-pay | Admitting: Family Medicine

## 2020-01-02 DIAGNOSIS — G2581 Restless legs syndrome: Secondary | ICD-10-CM

## 2020-01-02 MED ORDER — PRAMIPEXOLE DIHYDROCHLORIDE 1 MG PO TABS: 1.0000 mg | ORAL_TABLET | Freq: Two times a day (BID) | ORAL | 1 refills | Status: DC

## 2020-03-03 ENCOUNTER — Encounter: Payer: Self-pay | Admitting: Family Medicine

## 2020-03-03 ENCOUNTER — Ambulatory Visit (INDEPENDENT_AMBULATORY_CARE_PROVIDER_SITE_OTHER): Payer: Medicare Other | Admitting: Family Medicine

## 2020-03-03 ENCOUNTER — Other Ambulatory Visit: Payer: Self-pay

## 2020-03-03 VITALS — BP 136/85 | HR 72 | Temp 97.1°F | Resp 16 | Ht 62.0 in | Wt 208.6 lb

## 2020-03-03 DIAGNOSIS — F3342 Major depressive disorder, recurrent, in full remission: Secondary | ICD-10-CM

## 2020-03-03 DIAGNOSIS — F331 Major depressive disorder, recurrent, moderate: Secondary | ICD-10-CM

## 2020-03-03 DIAGNOSIS — G2581 Restless legs syndrome: Secondary | ICD-10-CM

## 2020-03-03 DIAGNOSIS — Z23 Encounter for immunization: Secondary | ICD-10-CM

## 2020-03-03 DIAGNOSIS — E1169 Type 2 diabetes mellitus with other specified complication: Secondary | ICD-10-CM | POA: Diagnosis not present

## 2020-03-03 DIAGNOSIS — I10 Essential (primary) hypertension: Secondary | ICD-10-CM

## 2020-03-03 DIAGNOSIS — M7551 Bursitis of right shoulder: Secondary | ICD-10-CM

## 2020-03-03 DIAGNOSIS — G4709 Other insomnia: Secondary | ICD-10-CM

## 2020-03-03 LAB — POCT GLYCOSYLATED HEMOGLOBIN (HGB A1C): Hemoglobin A1C: 5.4 % (ref 4.0–5.6)

## 2020-03-03 MED ORDER — LOSARTAN POTASSIUM-HCTZ 100-25 MG PO TABS: 1.0000 | ORAL_TABLET | Freq: Every day | ORAL | 3 refills | Status: DC

## 2020-03-03 MED ORDER — DULOXETINE HCL 30 MG PO CPEP: 30.0000 mg | ORAL_CAPSULE | Freq: Every day | ORAL | 3 refills | Status: DC

## 2020-03-03 MED ORDER — BACLOFEN 10 MG PO TABS: 5.0000 mg | ORAL_TABLET | Freq: Every evening | ORAL | 2 refills | Status: DC | PRN

## 2020-03-03 MED ORDER — METOPROLOL TARTRATE 25 MG PO TABS: 25.0000 mg | ORAL_TABLET | Freq: Every day | ORAL | 3 refills | Status: DC

## 2020-03-03 MED ORDER — METFORMIN HCL 500 MG PO TABS: 500.0000 mg | ORAL_TABLET | Freq: Every day | ORAL | 3 refills | Status: DC

## 2020-03-03 MED ORDER — MELOXICAM 15 MG PO TABS: 15.0000 mg | ORAL_TABLET | Freq: Every day | ORAL | 2 refills | Status: DC | PRN

## 2020-03-03 MED ORDER — ROPINIROLE HCL 0.5 MG PO TABS: 0.5000 mg | ORAL_TABLET | Freq: Every day | ORAL | 2 refills | Status: DC

## 2020-03-03 NOTE — Patient Instructions (Addendum)
Thank you for coming to the office today.  Most likely you have bursitis of your shoulder. This is inflammation of the shoulder joint caused most often by arthritis or wear and tear. Often it can flare up to cause bursitis due to repetitive activities or other triggers. It may take time to heal, possibly 2 to 6 weeks, and it is important to avoid over use of shoulder especially above head motions that can re-aggravate the problem.  Recommend trial of Anti-inflammatory with Naproxen (Naprosyn) 500mg  tabs - take one with food and plenty of water TWICE daily every day (breakfast and dinner), for next 2 to 4 weeks, then you may take only as needed  - DO NOT TAKE any ibuprofen, aleve, motrin while you are taking this medicine  Recommend to start taking Tylenol Extra Strength 500mg  tabs - take 1 to 2 tabs per dose (max 1000mg ) every 6-8 hours for pain (take regularly, don't skip a dose for next 7 days), max 24 hour daily dose is 6 tablets or 3000mg . In the future you can repeat the same everyday Tylenol course for 1-2 weeks at a time.   Start taking Baclofen (Lioresal) 10mg  (muscle relaxant) - start with half (cut) to one whole pill at night as needed for next 1-3 nights (may make you drowsy, caution with driving) see how it affects you, then if tolerated increase to one pill 2 to 3 times a day or (every 8 hours as needed)    ------------------------------------------  Recent Labs    09/12/19 0803 03/03/20 0851  HGBA1C 5.8* 5.4    ----------------------------------------------------   If not improving as expected or keeps waking you up - call our office 1-2 weeks - we can add a muscle relaxant to help sleep.  Also we can refer you to Physical Therapy if just need to improve on range of motion.  Lastly if not improved by about 4-6 weeks - or just need treatment sooner - call and return for a Steroid Injection in shoulder.   Please schedule a Follow-up Appointment to: Return in about 6 weeks  (around 04/14/2020) for 6 week follow-up RLS, R Shoulder Bursitis.  If you have any other questions or concerns, please feel free to call the office or send a message through MyChart. You may also schedule an earlier appointment if necessary.  Additionally, you may be receiving a survey about your experience at our office within a few days to 1 week by e-mail or mail. We value your feedback.  , DO [facility], 09/14/19

## 2020-03-03 NOTE — Assessment & Plan Note (Signed)
See A&P Depression Continue SNRI

## 2020-03-03 NOTE — Progress Notes (Signed)
Subjective:    Patient ID: Janet [name], female    DOB: 08-23-1950, 70 y.o.   MRN: [id number]  Janet [name] is a 70 y.o. female presenting on 03/03/2020 for Diabetes (4 month DM A1c, RLS neuropathy, mood PHQ.//)   HPI   Left Leg Pain and Numbness/ Possible peripheral neuropathy Chronic L knee pain, history of osteoarthritis, s/p prior L knee TKR surgery and repeat procedure - Prior R knee replacement in past with good results in [facility] - August 2020, left knee replacement, they did repeat knee procedure manipulation 01/2019 - Did PT, still has pain and swelling of left lower extremity Completed PT and still continuing, with limited relief. Today admits has persistent Left lateral thigh area of numbness. Some improved on Duloxetine. Needs refill  Varicose veins Venous Insufficiency PAD Prior history >3 years ago vascular specialist years ago, had 2 veins removed. Limited improvement. She has seen [facility]. They discussed options and will pursue US imaging.   History of Bariatric Surgery - Gastric Bypass - She lost 95 lbs in 1.5 years - She has history of type 2 diabetes. Her A1c has been controlled after surgery   CHRONIC DM, Type 2: Reports no concernssince bariatric surgery wt loss, major improvement A1c recently was 5.8, well controlled now due for repeat A1c today Meds:Metformin 500mg  daily, IR - needs refill Reports good compliance. Tolerating well w/o side-effects Currently on ARB Lifestyle: - Diet (improved)  She had DM Eye exam in [facility] Denies hypoglycemia, polyuria, visual changes, numbness or tingling.  Restless Leg Syndrome / Insomnia Major Depression recurrent in remission  Chronic problem. Taking Mirapex 1mg  BID - says no improvement on Mirapex, would like to switch to other med, in past cannot recall if or when took Ropinirole. She had tried Gabapentin before considering this. She admits difficulty sleeping at night with pain and  RLS at night, and she will keep awake at night and feel tired during day - Previously dx with depression in past, was followed by Psychiatry and was on some medication cannot recall names of meds, >3-4 years ago, she stopped following with them. - Today she states mood is improved - Last visit was started on Duloxetine 30mg  daily. She says is helping mood, and also seems to help some neuropathy pain symptoms. But not resolve. She was on Gabapentin and Ropinirole in past.  CHRONIC HTN/ s/p Pacemaker Reportshome BP readings 120s/60-70s. Checks it at home occasionally Followed by [facility] for BP and Pacemaker. Current Meds -Losartan-HCTZ 100-25mg  daily, Metoprolol tartrate 25mg  nightly Reports good compliance, took meds today. Tolerating well, w/o complaints. Denies CP, dyspnea, HA, edema, dizziness / lightheadedness  Right Shoulder Pain Chronic issue with pain in Right shoulder. Prior surgery with rotator cuff repair 3+ years ago. Now has difficulty with lifting arm above shoulder length. She was doing a lot of repetitive activities in past few months including painting as well.   Health Maintenance:  Followed by [facility] specialist for Breast Cancer - no dx of breast cancer. They did mammogram and it was abnormal she has upcoming repeat biopsy.  Due for Flu Shot, will receive today    Depression screen [facility] 2/9 03/03/2020 09/19/2019 08/20/2019  Decreased Interest 0 0 1  Down, Depressed, Hopeless 0 0 1  PHQ - 2 Score 0 0 2  Altered sleeping 0 0 2  Tired, decreased energy 0 0 1  Change in appetite 0 0 1  Feeling bad or failure about yourself  0  0 1  Trouble concentrating 0 0 0  Moving slowly or fidgety/restless 0 0 1  Suicidal thoughts 0 0 0  PHQ-9 Score 0 0 8  Difficult doing work/chores Not difficult at all Not difficult at all Somewhat difficult    Social History   Tobacco Use  . Smoking status: Former Smoker    Packs/day: 1.00    Years: 25.00    Pack years:  25.00    Types: Cigarettes  . Smokeless tobacco: Former Engineer, water Use Topics  . Alcohol use: Never  . Drug use: Never    Review of Systems Per HPI unless specifically indicated above     Objective:    BP 136/85   Pulse 72   Temp (!) 97.1 F (36.2 C) (Temporal)   Resp 16   Ht 5\' 2"  (1.575 m)   Wt 208 lb 9.6 oz (94.6 kg)   SpO2 100%   BMI 38.15 kg/m   Wt Readings from Last 3 Encounters:  03/03/20 208 lb 9.6 oz (94.6 kg)  09/19/19 201 lb 9.6 oz (91.4 kg)  09/16/19 200 lb (90.7 kg)    Physical Exam Vitals and nursing note reviewed.  Constitutional:      General: She is not in acute distress.    Appearance: She is well-developed and well-nourished. She is not diaphoretic.     Comments: Well-appearing, comfortable, cooperative  HENT:     Head: Normocephalic and atraumatic.     Mouth/Throat:     Mouth: Oropharynx is clear and moist.  Eyes:     General:        Right eye: No discharge.        Left eye: No discharge.     Conjunctiva/sclera: Conjunctivae normal.  Cardiovascular:     Rate and Rhythm: Normal rate.  Pulmonary:     Effort: Pulmonary effort is normal.  Musculoskeletal:        General: No edema.  Skin:    General: Skin is warm and dry.     Findings: No erythema or rash.  Neurological:     Mental Status: She is alert and oriented to person, place, and time.  Psychiatric:        Mood and Affect: Mood and affect normal.        Behavior: Behavior normal.     Comments: Well groomed, good eye contact, normal speech and thoughts      Recent Labs    09/12/19 0803 03/03/20 0851  HGBA1C 5.8* 5.4     Results for orders placed or performed in visit on 03/03/20  POCT HgB A1C  Result Value Ref Range   Hemoglobin A1C 5.4 4.0 - 5.6 %      Assessment & Plan:   Problem List Items Addressed This Visit    Type 2 diabetes mellitus with other specified complication (HCC) - Primary    Well-controlled DM with A1c down to 5.4 now S/p bariatric surgery wt  loss Complications - peripheral neuropathy, hyperlipidemia, depression, obesity, OSA  increases risk of future cardiovascular complications   Plan:  1. Continue current therapy - Metformin 500mg  daily - refill now, no change, future we can switch to XR daily, on minimal medication /sp bariatric surgery 2. Encourage improved lifestyle - low carb, low sugar diet, reduce portion size, continue improving regular exercise 3. Check CBG , bring log to next visit for review 4. Continue ARB, Statin      Relevant Medications   metFORMIN (GLUCOPHAGE) 500 MG tablet  losartan-hydrochlorothiazide (HYZAAR) 100-25 MG tablet   Other Relevant Orders   POCT HgB A1C (Completed)   Restless leg syndrome    Chronic problem Primarily affecting her pain and sleep w insomnia  Discontinue Pramipexole - switch to Ropinirole. 0.5mg  nightly dose adjust up to 1 then up to 2mg  nightly adjust further as indicated.  Continue Duloxetine 30mg  daily - some relief, may dose adjust to 60 vs add back Gabapentin - f/u at next visit  Also offer Referral to Neurology for evaluation of RLS if not improving.      Relevant Medications   DULoxetine (CYMBALTA) 30 MG capsule   rOPINIRole (REQUIP) 0.5 MG tablet   Other insomnia    See A&P Depression Continue SNRI      Relevant Medications   DULoxetine (CYMBALTA) 30 MG capsule   Major depression, recurrent, full remission (HCC)    Chronic problem Recurrent now in remission See PHQ Improved on SNRI Duloxetine 30mg , consider dose adjust for mood/sleep or pain      Relevant Medications   DULoxetine (CYMBALTA) 30 MG capsule    Other Visit Diagnoses    Needs flu shot       Relevant Orders   Flu Vaccine QUAD High Dose(Fluad) (Completed)   Major depressive disorder, recurrent, moderate (HCC)       Relevant Medications   DULoxetine (CYMBALTA) 30 MG capsule   Essential hypertension       Relevant Medications   losartan-hydrochlorothiazide (HYZAAR) 100-25 MG tablet    metoprolol tartrate (LOPRESSOR) 25 MG tablet   Chronic bursitis of right shoulder       Relevant Medications   meloxicam (MOBIC) 15 MG tablet   baclofen (LIORESAL) 10 MG tablet     Consistent with subacute on chronic RIGHT-shoulder bursitis vs rotator cuff tendinopathy with some reduced active ROM but without significant evidence of muscle tear (no weakness). Known repetitive overhead/strenuous activity as likely etiology  No clear etiology of injury. 27 old patient with likely underlying arthritis -  No imaging on chart  Plan: 1. Start rx Meloxicam 15mg  daily PRN for few weeks as needed, caution NSAID 2. May take Tylenol Ex Str 1-2 q 6 hr PRN 3. Relative rest but keep shoulder mobile, demonstrated ROM exercises, avoid heavy lifting 4. May try heating pad PRN 5. Follow-up 4-6 weeks if not improved for re-evaluation, consider referral to Physical Therapy, X-rays, and or subacromial steroid injection    Meds ordered this encounter  Medications  . DULoxetine (CYMBALTA) 30 MG capsule    Sig: Take 1 capsule (30 mg total) by mouth daily.    Dispense:  90 capsule    Refill:  3  . metFORMIN (GLUCOPHAGE) 500 MG tablet    Sig: Take 1 tablet (500 mg total) by mouth daily with breakfast.    Dispense:  90 tablet    Refill:  3  . losartan-hydrochlorothiazide (HYZAAR) 100-25 MG tablet    Sig: Take 1 tablet by mouth daily.    Dispense:  90 tablet    Refill:  3  . metoprolol tartrate (LOPRESSOR) 25 MG tablet    Sig: Take 1 tablet (25 mg total) by mouth at bedtime.    Dispense:  180 tablet    Refill:  3  . rOPINIRole (REQUIP) 0.5 MG tablet    Sig: Take 1 tablet (0.5 mg total) by mouth at bedtime. May add 1 extra pill every 1 week if medication is not working, up to max of 4 pills.    Dispense:  90 tablet    Refill:  2  . meloxicam (MOBIC) 15 MG tablet    Sig: Take 1 tablet (15 mg total) by mouth daily as needed for pain.    Dispense:  30 tablet    Refill:  2  . baclofen (LIORESAL) 10 MG  tablet    Sig: Take 0.5-1 tablets (5-10 mg total) by mouth at bedtime as needed for muscle spasms.    Dispense:  30 each    Refill:  2      Follow up plan: Return in about 6 weeks (around 04/14/2020) for 6 week follow-up RLS, R Shoulder Bursitis.  Future anticipate annual physical and labs in 08/2020  Nobie Putnam, [facility] 03/03/2020, 8:50 AM

## 2020-03-03 NOTE — Assessment & Plan Note (Signed)
Well-controlled DM with A1c down to 5.4 now S/p bariatric surgery wt loss Complications - peripheral neuropathy, hyperlipidemia, depression, obesity, OSA  increases risk of future cardiovascular complications   Plan:  1. Continue current therapy - Metformin 500mg  daily - refill now, no change, future we can switch to XR daily, on minimal medication /sp bariatric surgery 2. Encourage improved lifestyle - low carb, low sugar diet, reduce portion size, continue improving regular exercise 3. Check CBG , bring log to next visit for review 4. Continue ARB, Statin

## 2020-03-03 NOTE — Assessment & Plan Note (Signed)
Chronic problem Recurrent now in remission See PHQ Improved on SNRI Duloxetine 30mg, consider dose adjust for mood/sleep or pain 

## 2020-03-03 NOTE — Assessment & Plan Note (Signed)
Chronic problem Primarily affecting her pain and sleep w insomnia  Discontinue Pramipexole - switch to Ropinirole. 0.5mg  nightly dose adjust up to 1 then up to 2mg  nightly adjust further as indicated.  Continue Duloxetine 30mg  daily - some relief, may dose adjust to 60 vs add back Gabapentin - f/u at next visit  Also offer Referral to Neurology for evaluation of RLS if not improving.

## 2020-03-08 ENCOUNTER — Other Ambulatory Visit: Payer: Medicare Other

## 2020-03-08 DIAGNOSIS — Z20822 Contact with and (suspected) exposure to covid-19: Secondary | ICD-10-CM | POA: Diagnosis not present

## 2020-03-10 LAB — SARS-COV-2, NAA 2 DAY TAT

## 2020-03-10 LAB — NOVEL CORONAVIRUS, NAA: SARS-CoV-2, NAA: NOT DETECTED

## 2020-04-14 ENCOUNTER — Other Ambulatory Visit: Payer: Self-pay | Admitting: Family Medicine

## 2020-04-14 ENCOUNTER — Ambulatory Visit
Admission: RE | Admit: 2020-04-14 | Discharge: 2020-04-14 | Disposition: A | Discharge status: Home / Self Care | Payer: Medicare Other | Attending: Family Medicine | Admitting: Family Medicine

## 2020-04-14 ENCOUNTER — Ambulatory Visit (INDEPENDENT_AMBULATORY_CARE_PROVIDER_SITE_OTHER): Payer: Medicare Other | Admitting: Family Medicine

## 2020-04-14 ENCOUNTER — Other Ambulatory Visit: Payer: Self-pay

## 2020-04-14 ENCOUNTER — Ambulatory Visit
Admission: RE | Admit: 2020-04-14 | Discharge: 2020-04-14 | Disposition: A | Discharge status: Home / Self Care | Payer: Medicare Other | Source: Ambulatory Visit | Attending: Family Medicine | Admitting: Family Medicine

## 2020-04-14 VITALS — BP 139/79 | HR 75 | Temp 97.5°F | Ht 62.0 in | Wt 206.6 lb

## 2020-04-14 DIAGNOSIS — G2581 Restless legs syndrome: Secondary | ICD-10-CM

## 2020-04-14 DIAGNOSIS — G8929 Other chronic pain: Secondary | ICD-10-CM | POA: Insufficient documentation

## 2020-04-14 DIAGNOSIS — M8949 Other hypertrophic osteoarthropathy, multiple sites: Secondary | ICD-10-CM | POA: Diagnosis not present

## 2020-04-14 DIAGNOSIS — M545 Low back pain, unspecified: Secondary | ICD-10-CM | POA: Diagnosis not present

## 2020-04-14 DIAGNOSIS — M5442 Lumbago with sciatica, left side: Secondary | ICD-10-CM

## 2020-04-14 DIAGNOSIS — G629 Polyneuropathy, unspecified: Secondary | ICD-10-CM

## 2020-04-14 DIAGNOSIS — M159 Polyosteoarthritis, unspecified: Secondary | ICD-10-CM

## 2020-04-14 DIAGNOSIS — R921 Mammographic calcification found on diagnostic imaging of breast: Secondary | ICD-10-CM | POA: Diagnosis not present

## 2020-04-14 DIAGNOSIS — R920 Mammographic microcalcification found on diagnostic imaging of breast: Secondary | ICD-10-CM | POA: Diagnosis not present

## 2020-04-14 DIAGNOSIS — I739 Peripheral vascular disease, unspecified: Secondary | ICD-10-CM

## 2020-04-14 DIAGNOSIS — F331 Major depressive disorder, recurrent, moderate: Secondary | ICD-10-CM

## 2020-04-14 DIAGNOSIS — Z0000 Encounter for general adult medical examination without abnormal findings: Secondary | ICD-10-CM

## 2020-04-14 DIAGNOSIS — E1169 Type 2 diabetes mellitus with other specified complication: Secondary | ICD-10-CM

## 2020-04-14 DIAGNOSIS — M5136 Other intervertebral disc degeneration, lumbar region: Secondary | ICD-10-CM

## 2020-04-14 MED ORDER — PRAMIPEXOLE DIHYDROCHLORIDE 1 MG PO TABS: 1.0000 mg | ORAL_TABLET | Freq: Two times a day (BID) | ORAL | 1 refills | Status: DC

## 2020-04-14 MED ORDER — GABAPENTIN 100 MG PO CAPS: ORAL_CAPSULE | ORAL | 2 refills | Status: DC

## 2020-04-14 NOTE — Patient Instructions (Addendum)
Thank you for coming to the office today.  Referral to Spine specialist - may warrant future spinal injections or procedure.  [facility]) / Surgery performed at [facility] [address] [place], [place] [address] Ph - [phone] (to refer)  -----  Restart Mirapex, discontinued Ropinirole  Start Gabapentin 100mg  capsules, take at night for 2-3 nights only, and then increase to 2 times a day for a few days, and then may increase to 3 times a day, it may make you drowsy, if helps significantly at night only, then you can increase instead to 3 capsules at night, instead of 3 times a day - In the future if needed, we can significantly increase the dose if tolerated well, some common doses are 300mg  three times a day up to 600mg  three times a day, usually it takes several weeks or months to get to higher doses  X-ray of low back spine today  DUE for FASTING BLOOD WORK (no food or drink after midnight before the lab appointment, only water or coffee without cream/sugar on the morning of)  SCHEDULE "Lab Only" visit in the morning at the clinic for lab draw in 5 MONTHS   - Make sure Lab Only appointment is at about 1 week before your next appointment, so that results will be available  For Lab Results, once available within 2-3 days of blood draw, you can can log in to MyChart online to view your results and a brief explanation. Also, we can discuss results at next follow-up visit.   Please schedule a Follow-up Appointment to: Return in about 5 months (around 09/11/2020) for 5 month fasting lab only then 1 week later Annual Physical .  If you have any other questions or concerns, please feel free to call the office or send a message through MyChart. You may also schedule an earlier appointment if necessary.  Additionally, you may be receiving a survey about your experience at our office within a few days to 1 week by e-mail or mail. We value your  feedback.  , DO [facility], 09/13/2020

## 2020-04-14 NOTE — Progress Notes (Signed)
Subjective:    Patient ID: Janet [name], female    DOB: May 25, 1950, 70 y.o.   MRN: [id number]  Janet [name] is a 70 y.o. female presenting on 04/14/2020 for Shoulder Pain (Right shoulder continues having pain even while resting and medication that was prescribed for her is not working for her. Also had some swollen last week.) and Leg Pain (Left leg continues having some pain and numbness associated  with her lower back pain.)   HPI   Chronic Low Back Pain Chronic Osteoarthritis, Lumbar DDD / Sciatica Left Leg Pain and Numbness/ Possible peripheral neuropathy Chronic L knee pain, history of osteoarthritis, s/p prior L knee TKR surgery and repeat procedure - Prior R knee replacement in past with good results in [facility] - August 2020, left knee replacement, they did repeat knee procedure manipulation 01/2019 - Did PT, still has pain and swelling of left lower extremity Completed PT and still continuing, with limited relief. Today admits has persistent Left lateral thigh area of numbness. Some improved on Duloxetine. Needs refill  One bad flare with leg pain and muscle spasm a week. She has tried Baclofen with good results PRN. - Difficulty with low back pain with radiation into Left lower leg posterior with numbness and pain.  Varicose veins Venous Insufficiency PAD  History of Bariatric Surgery - Gastric Bypass - She lost 95 lbs in 1.5 years - She has history of type 2 diabetes. Her A1c has been controlled after surgery   Restless Leg Syndrome / Insomnia Major Depression recurrent in remission  Since last visit. Failed Ropinirole, tried 1mg  max dose after 3 days, then stopped and returned to Mirapex 1mg  BID needs new order.  Chronic problem. Taking Mirapex 1mg  BID - says no improvement on Mirapex, would like to switch to other med, in past cannot recall if or when took Ropinirole. She had tried Gabapentin before considering this. She admits difficulty sleeping at night  with pain and RLS at night, and she will keep awake at night and feel tired during day - Previously dx with depression in past, was followed by Psychiatry and was on some medication cannot recall names of meds, >3-4 years ago, she stopped following with them. - Today she states mood is improved - Last visit was started on Duloxetine 30mg  daily. She says is helping mood, and also seems to help some neuropathy pain symptoms. But not resolve. She was on Gabapentin and Ropinirole in past.  CHRONIC HTN/ s/p Pacemaker Reportshome BP readings 120s/60-70s. Checks it at home occasionally Followed by [facility] for BP and Pacemaker. Current Meds -Losartan-HCTZ 100-25mg  daily, Metoprolol tartrate 25mg  nightly Reports good compliance, took meds today. Tolerating well, w/o complaints. Denies CP, dyspnea, HA, edema, dizziness / lightheadedness  Chronic R Shoulder No significant improvement with Right Shoulder pain, she has tried Baclofen and Meloxicam, does not help as much.    Depression screen [facility] 2/9 03/03/2020 09/19/2019 08/20/2019  Decreased Interest 0 0 1  Down, Depressed, Hopeless 0 0 1  PHQ - 2 Score 0 0 2  Altered sleeping 0 0 2  Tired, decreased energy 0 0 1  Change in appetite 0 0 1  Feeling bad or failure about yourself  0 0 1  Trouble concentrating 0 0 0  Moving slowly or fidgety/restless 0 0 1  Suicidal thoughts 0 0 0  PHQ-9 Score 0 0 8  Difficult doing work/chores Not difficult at all Not difficult at all Somewhat difficult    Social History  Tobacco Use  . Smoking status: Former Smoker    Packs/day: 1.00    Years: 25.00    Pack years: 25.00    Types: Cigarettes  . Smokeless tobacco: Former Engineer, water Use Topics  . Alcohol use: Never  . Drug use: Never    Review of Systems Per HPI unless specifically indicated above     Objective:    BP 139/79   Pulse 75   Temp (!) 97.5 F (36.4 C)   Ht 5\' 2"  (1.575 m)   Wt 206 lb 9.6 oz (93.7 kg)   SpO2 98%    BMI 37.79 kg/m   Wt Readings from Last 3 Encounters:  04/14/20 206 lb 9.6 oz (93.7 kg)  03/03/20 208 lb 9.6 oz (94.6 kg)  09/19/19 201 lb 9.6 oz (91.4 kg)    Physical Exam Vitals and nursing note reviewed.  Constitutional:      General: She is not in acute distress.    Appearance: She is well-developed. She is not diaphoretic.     Comments: Well-appearing, comfortable, cooperative  HENT:     Head: Normocephalic and atraumatic.  Eyes:     General:        Right eye: No discharge.        Left eye: No discharge.     Conjunctiva/sclera: Conjunctivae normal.  Cardiovascular:     Rate and Rhythm: Normal rate.  Pulmonary:     Effort: Pulmonary effort is normal.  Musculoskeletal:     Comments: Unable to sit due to back pain. Has some reproduced pain L side with SLR.  Skin:    General: Skin is warm and dry.     Findings: No erythema or rash.  Neurological:     Mental Status: She is alert and oriented to person, place, and time.  Psychiatric:        Behavior: Behavior normal.     Comments: Well groomed, good eye contact, normal speech and thoughts    Results for orders placed or performed in visit on 03/08/20  Novel Coronavirus, NAA ([facility])   Specimen: Nasopharyngeal(NP) swabs in vial transport medium   Nasopharynge  Screenin  Result Value Ref Range   SARS-CoV-2, NAA Not Detected Not Detected  SARS-COV-2, NAA 2 DAY TAT   Nasopharynge  Screenin  Result Value Ref Range   SARS-CoV-2, NAA 2 DAY TAT Performed       Assessment & Plan:   Problem List Items Addressed This Visit    Restless leg syndrome - Primary    Chronic problem Primarily affecting her pain and sleep w insomnia Failed Ropinirole trial up to 1mg  did not hep at all, she has resumed dosing Mirapex, needs new order  Continue Duloxetine 30mg  daily - some relief, may dose adjust to 60 ADD back Gabapentin today, dose titrate starting 100mg  TID as advised can adjust further      Relevant Medications    pramipexole (MIRAPEX) 1 MG tablet   gabapentin (NEURONTIN) 100 MG capsule   Primary osteoarthritis involving multiple joints   Relevant Orders   DG Lumbar Spine Complete   Peripheral nerve disease   Relevant Medications   pramipexole (MIRAPEX) 1 MG tablet   gabapentin (NEURONTIN) 100 MG capsule   Other Relevant Orders   DG Lumbar Spine Complete   Chronic left-sided low back pain with left-sided sciatica   Relevant Medications   pramipexole (MIRAPEX) 1 MG tablet   gabapentin (NEURONTIN) 100 MG capsule   Other Relevant Orders   DG  Lumbar Spine Complete      #Chronic Back Pain L Sided, Sciatica Chronic Neuropathy, lower extremity  Will check Lumbar X-ray today eval DDD, given persistent symptoms and neuropathy will anticipate referral to [facility]Sp[facility] specialty for further management may warrant spinal injection or other therapy.  Orders Placed This Encounter  Procedures  . DG Lumbar Spine Complete    Standing Status:   Future    Standing Expiration Date:   04/14/2021    Order Specific Question:   Reason for Exam (SYMPTOM  OR DIAGNOSIS REQUIRED)    Answer:   chronic low back pain with left sided sciatica and chronic leg numbness, RLS, known OA/DDD, concern worsening neuropathic symptoms    Order Specific Question:   Preferred imaging location?    Answer:   [facility]      Meds ordered this encounter  Medications  . pramipexole (MIRAPEX) 1 MG tablet    Sig: Take 1 tablet (1 mg total) by mouth in the morning and at bedtime.    Dispense:  180 tablet    Refill:  1  . gabapentin (NEURONTIN) 100 MG capsule    Sig: Start 1 capsule daily, increase by 1 cap every 2-3 days as tolerated up to 3 times a day, or may take 3 at once in evening.    Dispense:  90 capsule    Refill:  2      Follow up plan: Return in about 5 months (around 09/11/2020) for 5 month fasting lab only then 1 week later Annual Physical .  Future labs ordered for 09/09/20  Saralyn Pilar, DO [facility] 04/14/2020, 10:10 AM

## 2020-04-14 NOTE — Assessment & Plan Note (Signed)
Chronic problem Primarily affecting her pain and sleep w insomnia Failed Ropinirole trial up to 1mg  did not hep at all, she has resumed dosing Mirapex, needs new order  Continue Duloxetine 30mg  daily - some relief, may dose adjust to 60 ADD back Gabapentin today, dose titrate starting 100mg  TID as advised can adjust further

## 2020-04-15 NOTE — Addendum Note (Signed)
Addended by: Smitty Cords on: 04/15/2020 05:34 PM   Modules accepted: Orders

## 2020-04-17 ENCOUNTER — Telehealth: Payer: Self-pay

## 2020-04-17 DIAGNOSIS — M5136 Other intervertebral disc degeneration, lumbar region: Secondary | ICD-10-CM

## 2020-04-17 NOTE — Telephone Encounter (Signed)
Ordered Lumbar MRI without contrast.  It should be scheduled before patient's appointment with [facility] Neurosurgery Dr Marcell Barlow 05/21/20.  Please proceed with insurance approval and scheduling, can route to referral team.  Thank you  Saralyn Pilar, DO [facility] 04/17/2020, 12:46 PM

## 2020-04-17 NOTE — Telephone Encounter (Signed)
Copied from CRM [id number]. Topic: General - Other >> Apr 16, 2020  4:51 PM Gaetana Michaelis A wrote: Reason for CRM: Patty from [facility] has called requesting PCP to order an MRI of the patient's lumbar Dr. Marcell Barlow is requesting to have the MRI completed prior to 05/21/20 so it can be gone over with patient at their visit

## 2020-05-13 ENCOUNTER — Telehealth: Payer: Self-pay | Admitting: Family Medicine

## 2020-05-13 DIAGNOSIS — F40240 Claustrophobia: Secondary | ICD-10-CM

## 2020-05-13 MED ORDER — LORAZEPAM 0.5 MG PO TABS
ORAL_TABLET | ORAL | 0 refills | Status: DC
Start: 2020-05-13 — End: 2020-09-16

## 2020-05-13 NOTE — Telephone Encounter (Signed)
Please notify patient that Lorazepam has been sent to pharmacy.  Instructions are on Rx bottle - Take 1-2 tablets 30-60 minutes prior to MRI. Can take additional tab if needed at time of test. Must have driver to and from test. Can cause sedation  Saralyn Pilar, DO [facility] 05/13/2020, 4:59 PM

## 2020-05-13 NOTE — Telephone Encounter (Signed)
Pt has MRI on 4/01 and states she is very claustrophobic.   Pt states they advised her in Radiology to call her dr for something to help her relax.  Two pills: one 1 hour before and one at time of MRI. Pt would like this sent to   [id number] - [place], [place] - [address] AT [facility] [address] [facility]

## 2020-05-14 NOTE — Telephone Encounter (Signed)
The pt was notified and she verbalize understanding.  

## 2020-05-25 ENCOUNTER — Ambulatory Visit (HOSPITAL_COMMUNITY): Payer: Medicare Other

## 2020-05-29 ENCOUNTER — Ambulatory Visit (HOSPITAL_COMMUNITY)
Admission: RE | Admit: 2020-05-29 | Discharge: 2020-05-29 | Disposition: A | Discharge status: Home / Self Care | Payer: Medicare Other | Source: Ambulatory Visit | Attending: Physician Assistant | Admitting: Physician Assistant

## 2020-05-29 ENCOUNTER — Other Ambulatory Visit: Payer: Self-pay

## 2020-05-29 ENCOUNTER — Ambulatory Visit (HOSPITAL_COMMUNITY)
Admission: RE | Admit: 2020-05-29 | Discharge: 2020-05-29 | Disposition: A | Discharge status: Home / Self Care | Payer: Medicare Other | Source: Ambulatory Visit | Attending: Family Medicine | Admitting: Family Medicine

## 2020-05-29 DIAGNOSIS — I700 Atherosclerosis of aorta: Secondary | ICD-10-CM | POA: Diagnosis not present

## 2020-05-29 DIAGNOSIS — M5136 Other intervertebral disc degeneration, lumbar region: Secondary | ICD-10-CM | POA: Diagnosis not present

## 2020-05-29 DIAGNOSIS — Z950 Presence of cardiac pacemaker: Secondary | ICD-10-CM | POA: Insufficient documentation

## 2020-05-29 DIAGNOSIS — M545 Low back pain, unspecified: Secondary | ICD-10-CM | POA: Diagnosis not present

## 2020-05-29 NOTE — Progress Notes (Signed)
Per order, No programming changes required for MRI today  Will send transmission after completion of scan

## 2020-05-30 ENCOUNTER — Other Ambulatory Visit: Payer: Self-pay | Admitting: Family Medicine

## 2020-05-30 DIAGNOSIS — M7551 Bursitis of right shoulder: Secondary | ICD-10-CM

## 2020-06-03 ENCOUNTER — Other Ambulatory Visit: Payer: Self-pay | Admitting: Family Medicine

## 2020-06-03 DIAGNOSIS — M7551 Bursitis of right shoulder: Secondary | ICD-10-CM

## 2020-06-03 NOTE — Telephone Encounter (Signed)
Requested medications are due for refill today yes  Requested medications are on the active medication list yes  Last refill 3/2  Last visit 03/2020  Future visit scheduled 08/2020  Notes to clinic Not Delegated.

## 2020-06-11 DIAGNOSIS — M4310 Spondylolisthesis, site unspecified: Secondary | ICD-10-CM | POA: Diagnosis not present

## 2020-06-11 DIAGNOSIS — M5416 Radiculopathy, lumbar region: Secondary | ICD-10-CM | POA: Diagnosis not present

## 2020-07-09 ENCOUNTER — Other Ambulatory Visit: Payer: Self-pay | Admitting: Family Medicine

## 2020-07-09 DIAGNOSIS — M5442 Lumbago with sciatica, left side: Secondary | ICD-10-CM

## 2020-07-09 DIAGNOSIS — G2581 Restless legs syndrome: Secondary | ICD-10-CM

## 2020-07-09 DIAGNOSIS — G629 Polyneuropathy, unspecified: Secondary | ICD-10-CM

## 2020-07-09 DIAGNOSIS — G8929 Other chronic pain: Secondary | ICD-10-CM

## 2020-07-23 ENCOUNTER — Telehealth: Payer: Self-pay | Admitting: Family Medicine

## 2020-07-28 ENCOUNTER — Telehealth: Payer: Self-pay

## 2020-07-28 DIAGNOSIS — G2581 Restless legs syndrome: Secondary | ICD-10-CM

## 2020-07-28 NOTE — Telephone Encounter (Signed)
Copied from CRM [id number]. Topic: Referral - Request for Referral >> Jul 28, 2020 10:53 AM Glean Salen wrote: Has patient seen PCP for this complaint? {yes Reason for referral: for chronic restless leg Patient wants to be referred to dr Malvin Johns at [facility]  This is not a pt of [facility] please route accordingly

## 2020-09-04 DIAGNOSIS — H524 Presbyopia: Secondary | ICD-10-CM | POA: Diagnosis not present

## 2020-09-08 ENCOUNTER — Other Ambulatory Visit: Payer: Self-pay

## 2020-09-08 DIAGNOSIS — E1169 Type 2 diabetes mellitus with other specified complication: Secondary | ICD-10-CM

## 2020-09-08 DIAGNOSIS — G2581 Restless legs syndrome: Secondary | ICD-10-CM

## 2020-09-08 DIAGNOSIS — Z0000 Encounter for general adult medical examination without abnormal findings: Secondary | ICD-10-CM

## 2020-09-08 DIAGNOSIS — M8949 Other hypertrophic osteoarthropathy, multiple sites: Secondary | ICD-10-CM

## 2020-09-08 DIAGNOSIS — M159 Polyosteoarthritis, unspecified: Secondary | ICD-10-CM

## 2020-09-08 DIAGNOSIS — E785 Hyperlipidemia, unspecified: Secondary | ICD-10-CM

## 2020-09-08 DIAGNOSIS — F331 Major depressive disorder, recurrent, moderate: Secondary | ICD-10-CM

## 2020-09-08 DIAGNOSIS — I739 Peripheral vascular disease, unspecified: Secondary | ICD-10-CM

## 2020-09-09 ENCOUNTER — Other Ambulatory Visit: Payer: Medicare Other

## 2020-09-09 ENCOUNTER — Other Ambulatory Visit: Payer: Self-pay

## 2020-09-09 DIAGNOSIS — Z0000 Encounter for general adult medical examination without abnormal findings: Secondary | ICD-10-CM | POA: Diagnosis not present

## 2020-09-09 DIAGNOSIS — E1169 Type 2 diabetes mellitus with other specified complication: Secondary | ICD-10-CM | POA: Diagnosis not present

## 2020-09-09 DIAGNOSIS — F331 Major depressive disorder, recurrent, moderate: Secondary | ICD-10-CM | POA: Diagnosis not present

## 2020-09-09 DIAGNOSIS — E785 Hyperlipidemia, unspecified: Secondary | ICD-10-CM | POA: Diagnosis not present

## 2020-09-10 LAB — CBC WITH DIFFERENTIAL/PLATELET
Absolute Monocytes: 578 cells/uL (ref 200–950)
Basophils Absolute: 23 cells/uL (ref 0–200)
Basophils Relative: 0.3 %
Eosinophils Absolute: 208 cells/uL (ref 15–500)
Eosinophils Relative: 2.7 %
HCT: 39 % (ref 35.0–45.0)
Hemoglobin: 12.7 g/dL (ref 11.7–15.5)
Lymphs Abs: 1887 cells/uL (ref 850–3900)
MCH: 30.2 pg (ref 27.0–33.0)
MCHC: 32.6 g/dL (ref 32.0–36.0)
MCV: 92.9 fL (ref 80.0–100.0)
MPV: 11.9 fL (ref 7.5–12.5)
Monocytes Relative: 7.5 %
Neutro Abs: 5005 cells/uL (ref 1500–7800)
Neutrophils Relative %: 65 %
Platelets: 315 10*3/uL (ref 140–400)
RBC: 4.2 10*6/uL (ref 3.80–5.10)
RDW: 12.8 % (ref 11.0–15.0)
Total Lymphocyte: 24.5 %
WBC: 7.7 10*3/uL (ref 3.8–10.8)

## 2020-09-10 LAB — LIPID PANEL
Cholesterol: 133 mg/dL (ref <200)
HDL: 52 mg/dL (ref >=50)
LDL Cholesterol (Calc): 67 mg/dL (calc)
Non-HDL Cholesterol (Calc): 81 mg/dL (calc) (ref <130)
Total CHOL/HDL Ratio: 2.6 (calc) (ref <5.0)
Triglycerides: 56 mg/dL (ref <150)

## 2020-09-10 LAB — COMPLETE METABOLIC PANEL WITH GFR
AG Ratio: 1.5 (calc) (ref 1.0–2.5)
ALT: 14 U/L (ref 6–29)
AST: 16 U/L (ref 10–35)
Albumin: 4.1 g/dL (ref 3.6–5.1)
Alkaline phosphatase (APISO): 101 U/L (ref 37–153)
BUN/Creatinine Ratio: 37 (calc) — ABNORMAL HIGH (ref 6–22)
BUN: 26 mg/dL — ABNORMAL HIGH (ref 7–25)
CO2: 29 mmol/L (ref 20–32)
Calcium: 9.4 mg/dL (ref 8.6–10.4)
Chloride: 105 mmol/L (ref 98–110)
Creat: 0.71 mg/dL (ref 0.50–1.05)
Globulin: 2.7 g/dL (calc) (ref 1.9–3.7)
Glucose, Bld: 105 mg/dL — ABNORMAL HIGH (ref 65–99)
Potassium: 4.4 mmol/L (ref 3.5–5.3)
Sodium: 142 mmol/L (ref 135–146)
Total Bilirubin: 0.9 mg/dL (ref 0.2–1.2)
Total Protein: 6.8 g/dL (ref 6.1–8.1)
eGFR: 93 mL/min/{1.73_m2} (ref >=60)

## 2020-09-10 LAB — HEMOGLOBIN A1C
Hgb A1c MFr Bld: 5.7 % of total Hgb — ABNORMAL HIGH (ref <5.7)
Mean Plasma Glucose: 117 mg/dL
eAG (mmol/L): 6.5 mmol/L

## 2020-09-10 LAB — TSH: TSH: 1.14 mIU/L (ref 0.40–4.50)

## 2020-09-16 ENCOUNTER — Encounter: Payer: Self-pay | Admitting: Family Medicine

## 2020-09-16 ENCOUNTER — Other Ambulatory Visit: Payer: Self-pay

## 2020-09-16 ENCOUNTER — Ambulatory Visit (INDEPENDENT_AMBULATORY_CARE_PROVIDER_SITE_OTHER): Payer: Medicare Other | Admitting: Family Medicine

## 2020-09-16 VITALS — BP 117/66 | HR 69 | Ht 62.0 in | Wt 208.8 lb

## 2020-09-16 DIAGNOSIS — F3342 Major depressive disorder, recurrent, in full remission: Secondary | ICD-10-CM

## 2020-09-16 DIAGNOSIS — E1169 Type 2 diabetes mellitus with other specified complication: Secondary | ICD-10-CM

## 2020-09-16 DIAGNOSIS — I872 Venous insufficiency (chronic) (peripheral): Secondary | ICD-10-CM

## 2020-09-16 DIAGNOSIS — Z0000 Encounter for general adult medical examination without abnormal findings: Secondary | ICD-10-CM | POA: Diagnosis not present

## 2020-09-16 DIAGNOSIS — G629 Polyneuropathy, unspecified: Secondary | ICD-10-CM

## 2020-09-16 DIAGNOSIS — Z23 Encounter for immunization: Secondary | ICD-10-CM

## 2020-09-16 DIAGNOSIS — M6790 Unspecified disorder of synovium and tendon, unspecified site: Secondary | ICD-10-CM

## 2020-09-16 DIAGNOSIS — G2581 Restless legs syndrome: Secondary | ICD-10-CM

## 2020-09-16 MED ORDER — SHINGRIX 50 MCG/0.5ML IM SUSR: INTRAMUSCULAR | 0 refills | Status: DC

## 2020-09-16 NOTE — Patient Instructions (Addendum)
Thank you for coming to the office today.  Left Hand Tendon Nodule vs Cyst Try a Corn or Callus circular pad with a hole in the middle over the spot to off load it. Future if not improving or worse, let me know we can refer to a Hand doctor.   Recent Labs    03/03/20 0851 09/09/20 0832  HGBA1C 5.4 5.7*   Stop the Metformin 500mg  daily, since you are doing great on blood sugar, it is okay to stop this. I am hopeful that you can manage the diabetes with diet and keep A1c < 7.0  -------------------------------------  Shingles vaccine at [facility] when ready.  For yearly Diabetic Eye Exam. Fax [facility] copy of result.  [facility]   Address: [address] [place], [place] Phone: [phone]  Website: [email or web address]   [facility] [address] [place], [place] Phone: [phone] [email or web address]  [facility]  Address: [address] [place], [place], [place] Phone: [phone]   [facility] [address] [place], [place] Phone: [phone]  [facility] Address: [address] [place], [place], [place]  Phone: [phone]     Please schedule a Follow-up Appointment to: Return in about 6 months (around 03/19/2021) for 6 month follow-up DM A1c, f/u hand tendon nodule.  If you have any other questions or concerns, please feel free to call the office or send a message through MyChart. You may also schedule an earlier appointment if necessary.  Additionally, you may be receiving a survey about your experience at our office within a few days to 1 week by e-mail or mail. We value your feedback.  03/21/2021, DO [facility], [facility]

## 2020-09-16 NOTE — Assessment & Plan Note (Signed)
Chronic problem Recurrent now in remission See PHQ Improved on SNRI Duloxetine 30mg, consider dose adjust for mood/sleep or pain 

## 2020-09-16 NOTE — Assessment & Plan Note (Signed)
Continue Mirapax, SNRI

## 2020-09-16 NOTE — Assessment & Plan Note (Signed)
Morbid Obesity BMI >38 with co morbid factors - Type 2 Diabetes, Depression, Hyperlipidemia Stable weight now Encourage lifestyle diet improve exercise activity

## 2020-09-16 NOTE — Progress Notes (Signed)
Subjective:    Patient ID: Janet [name], female    DOB: 1950-05-14, 70 y.o.   MRN: [id number]  Janet [name] is a 70 y.o. female presenting on 09/16/2020 for Annual Exam and Diabetes   HPI  Here for Annual Physical and Lab Review.  CHRONIC DM, Type 2: Reports no concerns since bariatric surgery wt loss, major improvement A1c recently was 5.8, well controlled now due for repeat A1c today Meds: Metformin 566m daily, IR - needs refill Reports good compliance. Tolerating well w/o side-effects Currently on ARB Lifestyle: - Diet (improved)  [facility], [facility], visual changes, numbness or tingling.  HYPERLIPIDEMIA: - Reports no concerns. Last lipid panel 08/2020, controlled on statni - Currently taking Atorvastatin 415m tolerating well without side effects or myalgias  Chronic Low Back Pain Chronic Osteoarthritis, Lumbar DDD / Sciatica Left Leg Pain and Numbness / Possible peripheral neuropathy Chronic L knee pain, history of osteoarthritis, s/p prior L knee TKR surgery and repeat procedure - [facility], they did repeat knee procedure manipulation 01/2019 - Did PT, still has pain and swelling of left lower extremity  Completed PT and still continuing, with limited relief. History of L thigh pain numbness Some improved on Duloxetine. Takes Baclofen PRN muscle spasm.   Varicose veins Venous Insufficiency   History of Bariatric Surgery - Gastric Bypass - She lost 95 lbs in 1.5 years - She has history of type 2 diabetes. Her A1c has been controlled after surgery    Restless Leg Syndrome / Insomnia Major Depression recurrent in remission  Chronic problems On mirapex 42m73mID, failed ropinirole, gabapentin - Previously dx with depression in past, was followed by Psychiatry and was on some medication cannot recall names of meds, >3-4  years ago, she stopped following with them. - Today she states mood is improved - Last visit was started on Duloxetine 30m23mily. She says is helping mood, and also seems to help some neuropathy pain symptoms. But not resolve.   CHRONIC HTN / s/p Pacemaker Reports home BP readings 120s/60-70s. Checks it at home occasionally [facility]. Current Meds - Losartan-HCTZ 100-25mg83mly, Metoprolol tartrate 25mg 59mtly Reports good compliance, took meds today. Tolerating well, w/o complaints. Denies CP, dyspnea, HA, edema, dizziness / lightheadedness  Additional concern  Nodule, L Hand tendon shealth of middle finger - sore to touch raised, when grip or something presses against it triggers pain.    Health Maintenance:  [place] since has been 2 months.  COVID Vaccine 2 doses then 1 booster on chart, 12/2019, and [place]  Depression screen PHQ 2/[facility]/20/2022 03/03/2020 09/19/2019  Decreased Interest 1 0 0  Down, Depressed, Hopeless 1 0 0  PHQ - 2 Score 2 0 0  Altered sleeping 3 0 0  Tired, decreased energy 1 0 0  Change in appetite 0 0 0  Feeling bad or failure about yourself  0 0 0  Trouble concentrating 1 0 0  Moving slowly or fidgety/restless 0 0 0  Suicidal thoughts 0 0 0  PHQ-9 Score 7 0 0  Difficult doing work/chores Not difficult at all Not difficult at all Not difficult at all    Past Medical History:  Diagnosis Date   GERD (gastroesophageal reflux  disease)    Heart murmur    Hyperlipidemia    Hypertension    PAD (peripheral artery disease) (HCC)    Restless leg    Sleep apnea    Urinary incontinence    Past Surgical History:  Procedure Laterality Date   APPENDECTOMY  2014   CARPAL TUNNEL RELEASE Left 1998   DG C-ARM 2 VIEW LEFT SHOULDER ([place] [facility]X)     DG C-ARM 2 VIEW RIGHT SHOULDER ([place] [facility]X)     ESOPHAGEAL DILATION  2012   2015, 2017,  2020   GASTRIC BYPASS  2019   HERNIA REPAIR  2019   left knee manipulation Left 02/05/2019   PACEMAKER IMPLANT  2017   POLYPECTOMY     SHOULDER SURGERY Left 2014   SHOULDER SURGERY Right 2017   TOTAL KNEE ARTHROPLASTY Right 2018   TOTAL KNEE ARTHROPLASTY Left 10/18/2018   vein removal Bilateral 2012   Social History   Socioeconomic History   Marital status: Married    Spouse name: Not on file   Number of children: Not on file   Years of education: Not on file   Highest education level: Not on file  Occupational History   Not on file  Tobacco Use   Smoking status: Former    Packs/day: 1.00    Years: 25.00    Pack years: 25.00    Types: Cigarettes   Smokeless tobacco: Former  Substance and Sexual Activity   Alcohol use: Never   Drug use: Never   Sexual activity: Not on file  Other Topics Concern   Not on file  Social History Narrative   Not on file   Social Determinants of Health   Financial Resource Strain: Not on file  Food Insecurity: Not on file  Transportation Needs: Not on file  Physical Activity: Not on file  Stress: Not on file  Social Connections: Not on file  Intimate Partner Violence: Not on file   Family History  Problem Relation Age of Onset   Heart attack Mother 42   Heart disease Father    Thyroid disease Father    Skin cancer Father    Brain cancer Maternal Grandmother    Current Outpatient Medications on File Prior to Visit  Medication Sig   aspirin 81 MG EC tablet Take by mouth.   atorvastatin (LIPITOR) 40 MG tablet Take 40 mg by mouth at bedtime.   baclofen (LIORESAL) 10 MG tablet TAKE 1/2 TO 1 TABLET(5 TO 10 MG) BY MOUTH AT BEDTIME AS NEEDED FOR MUSCLE SPASMS   DULoxetine (CYMBALTA) 30 MG capsule Take 1 capsule (30 mg total) by mouth daily.   gabapentin (NEURONTIN) 100 MG capsule START 1 CAPSULE BY MOUTH DAILY THEN INCREASE BY 1 CAPSULE EVERY 2-3 DAYS AS TOLERATED UP THREE TIMES DAILY OR MAY TAKE 3 AT ONCE IN THE EVENING    losartan-hydrochlorothiazide (HYZAAR) 100-25 MG tablet Take 1 tablet by mouth daily.   meloxicam (MOBIC) 15 MG tablet TAKE 1 TABLET(15 MG) BY MOUTH DAILY AS NEEDED FOR PAIN   metoprolol tartrate (LOPRESSOR) 25 MG tablet Take 1 tablet (25 mg total) by mouth at bedtime.   pramipexole (MIRAPEX) 1 MG tablet Take 1 tablet (1 mg total) by mouth in the morning and at bedtime.   No current facility-administered medications on file prior to visit.    Review of Systems  Constitutional:  Negative for activity change, appetite change, chills, diaphoresis, fatigue and fever.  HENT:  Negative for congestion and hearing loss.  Eyes:  Negative for visual disturbance.  Respiratory:  Negative for cough, chest tightness, shortness of breath and wheezing.   Cardiovascular:  Negative for chest pain, palpitations and leg swelling.  Gastrointestinal:  Negative for abdominal pain, constipation, diarrhea, nausea and vomiting.  Genitourinary:  Negative for dysuria, frequency and hematuria.  Musculoskeletal:  Negative for arthralgias and neck pain.  Skin:  Negative for rash.  Neurological:  Negative for dizziness, weakness, light-headedness, numbness and headaches.  Hematological:  Negative for adenopathy.  Psychiatric/Behavioral:  Negative for behavioral problems, dysphoric mood and sleep disturbance.   Per HPI unless specifically indicated above      Objective:    BP 117/66   Pulse 69   Ht '5\' 2"'  (1.575 m)   Wt 208 lb 12.8 oz (94.7 kg)   SpO2 99%   BMI 38.19 kg/m   Wt Readings from Last 3 Encounters:  09/16/20 208 lb 12.8 oz (94.7 kg)  04/14/20 206 lb 9.6 oz (93.7 kg)  03/03/20 208 lb 9.6 oz (94.6 kg)    Physical Exam Vitals and nursing note reviewed.  Constitutional:      General: She is not in acute distress.    Appearance: She is well-developed. She is obese. She is not diaphoretic.     Comments: Well-appearing, comfortable, cooperative  HENT:     Head: Normocephalic and atraumatic.  Eyes:      General:        Right eye: No discharge.        Left eye: No discharge.     Conjunctiva/sclera: Conjunctivae normal.     Pupils: Pupils are equal, round, and reactive to light.  Neck:     Thyroid: No thyromegaly.     Vascular: No carotid bruit.  Cardiovascular:     Rate and Rhythm: Normal rate and regular rhythm.     Pulses: Normal pulses.     Heart sounds: Normal heart sounds. No murmur heard. Pulmonary:     Effort: Pulmonary effort is normal. No respiratory distress.     Breath sounds: Normal breath sounds. No wheezing or rales.  Abdominal:     General: Bowel sounds are normal. There is no distension.     Palpations: Abdomen is soft. There is no mass.     Tenderness: There is no abdominal tenderness.  Musculoskeletal:        General: No tenderness. Normal range of motion.     Cervical back: Normal range of motion and neck supple.     Right lower leg: No edema.     Left lower leg: No edema.     Comments: Upper / Lower Extremities: - Normal muscle tone, strength bilateral upper extremities 5/5, lower extremities 5/5  Varicose veins spider veins bilateral  See foot exam  Left hand flexor tendon sheath middle finger with 1 cm nodular density possible cyst like fluid filled vs more solid scar tissue/nodule mild tender  Lymphadenopathy:     Cervical: No cervical adenopathy.  Skin:    General: Skin is warm and dry.     Findings: No erythema or rash.  Neurological:     Mental Status: She is alert and oriented to person, place, and time.     Comments: Distal sensation intact to light touch all extremities  Psychiatric:        Mood and Affect: Mood normal.        Behavior: Behavior normal.        Thought Content: Thought content normal.     Comments:  Well groomed, good eye contact, normal speech and thoughts    Diabetic Foot Exam - Simple   Simple Foot Form Diabetic Foot exam was performed with the following findings: Yes 09/16/2020 10:04 AM  Visual Inspection See  comments: Yes Sensation Testing See comments: Yes Pulse Check Posterior Tibialis and Dorsalis pulse intact bilaterally: Yes Comments Reduced monofilament bilateral to heels and forefoot / great toe. Has thick callus formation dry skin heels and great toe / forefoot. No ulceration. Varicose veins in ankle.      Results for orders placed or performed in visit on 09/08/20  TSH  Result Value Ref Range   TSH 1.14 0.40 - 4.50 mIU/L  Lipid panel  Result Value Ref Range   Cholesterol 133 <200 mg/dL   HDL 52 > OR = 50 mg/dL   Triglycerides 56 <150 mg/dL   LDL Cholesterol (Calc) 67 mg/dL (calc)   Total CHOL/HDL Ratio 2.6 <5.0 (calc)   Non-HDL Cholesterol (Calc) 81 <130 mg/dL (calc)  COMPLETE METABOLIC PANEL WITH GFR  Result Value Ref Range   Glucose, Bld 105 (H) 65 - 99 mg/dL   BUN 26 (H) 7 - 25 mg/dL   Creat 0.71 0.50 - 1.05 mg/dL   eGFR 93 > OR = 60 mL/min/1.51m   BUN/Creatinine Ratio 37 (H) 6 - 22 (calc)   Sodium 142 135 - 146 mmol/L   Potassium 4.4 3.5 - 5.3 mmol/L   Chloride 105 98 - 110 mmol/L   CO2 29 20 - 32 mmol/L   Calcium 9.4 8.6 - 10.4 mg/dL   Total Protein 6.8 6.1 - 8.1 g/dL   Albumin 4.1 3.6 - 5.1 g/dL   Globulin 2.7 1.9 - 3.7 g/dL (calc)   AG Ratio 1.5 1.0 - 2.5 (calc)   Total Bilirubin 0.9 0.2 - 1.2 mg/dL   Alkaline phosphatase (APISO) 101 37 - 153 U/L   AST 16 10 - 35 U/L   ALT 14 6 - 29 U/L  CBC with Differential/Platelet  Result Value Ref Range   WBC 7.7 3.8 - 10.8 Thousand/uL   RBC 4.20 3.80 - 5.10 Million/uL   Hemoglobin 12.7 11.7 - 15.5 g/dL   HCT 39.0 35.0 - 45.0 %   MCV 92.9 80.0 - 100.0 fL   MCH 30.2 27.0 - 33.0 pg   MCHC 32.6 32.0 - 36.0 g/dL   RDW 12.8 11.0 - 15.0 %   Platelets 315 140 - 400 Thousand/uL   MPV 11.9 7.5 - 12.5 fL   Neutro Abs 5,005 1,500 - 7,800 cells/uL   Lymphs Abs 1,887 850 - 3,900 cells/uL   Absolute Monocytes 578 200 - 950 cells/uL   Eosinophils Absolute 208 15 - 500 cells/uL   Basophils Absolute 23 0 - 200 cells/uL    Neutrophils Relative % 65 %   Total Lymphocyte 24.5 %   Monocytes Relative 7.5 %   Eosinophils Relative 2.7 %   Basophils Relative 0.3 %  Hemoglobin A1c  Result Value Ref Range   Hgb A1c MFr Bld 5.7 (H) <5.7 % of total Hgb   Mean Plasma Glucose 117 mg/dL   eAG (mmol/L) 6.5 mmol/L      Assessment & Plan:   Problem List Items Addressed This Visit     Venous insufficiency of both lower extremities    Stable chronic lower extremity edema / varicose veins, spider veins       Type 2 diabetes mellitus with other specified complication (HCC)    Well-controlled DM with A1c 5 range,  now 5.7 S/p bariatric surgery wt loss Complications - peripheral neuropathy, hyperlipidemia, depression, obesity, OSA  increases risk of future cardiovascular complications   Plan:  1. Discontinue Metformin IR 537m daily, given doing so well with management of DM. 2. Encourage improved lifestyle - low carb, low sugar diet, reduce portion size, continue improving regular exercise 3. Check CBG , bring log to next visit for review 4. Continue ARB, Statin Future DM Eye exam, recommended, she can schedule. DM Foot exam today       Peripheral nerve disease   Mor[place] ([place]    Morbid Obesity BMI >38 with co morbid factors - Type 2 Diabetes, Depression, Hyperlipidemia Stable weight now Encourage lifestyle diet improve exercise activity       Major depression, recurrent, full remission (HCC)    Chronic problem Recurrent now in remission See PHQ Improved on SNRI Duloxetine 330m consider dose adjust for mood/sleep or pain       Other Visit Diagnoses     Annual physical exam    -  Primary   Need for shingles vaccine       Relevant Medications   SHINGRIX injection   Nodule of flexor tendon sheath           Updated Health Maintenance information Printed rx Shingrix dose #2, take to pharmacy, this is 2 months later. Reviewed recent lab results with patient Encouraged improvement to  lifestyle with diet and exercise Goal of weight loss  #L Hand Likely cyst vs solid fibrous nodule on L hand palmar aspect of tendon sheath middle finger, recent development within past few months. Recommend modify activities, use a topical corn/callus pad with central hole to offload the area if possible, and see if she can avoid direct pressure on it. See if it gradually improves Or can refer [place].   Meds ordered this encounter  Medications   SHINGRIX injection    Sig: Inject 0.5 mL into muscle for shingles vaccine. This is 2nd and final dose.    Dispense:  0.5 mL    Refill:  0      Follow up plan: Return in about 6 months (around 03/19/2021) for 6 month follow-up DM A1c, f/u hand tendon nodule.  AlNobie PutnamDO[facility]htoned[facility] 09/16/2020, 10:05 AM

## 2020-09-16 NOTE — Assessment & Plan Note (Signed)
Stable chronic lower extremity edema / varicose veins, spider veins

## 2020-09-16 NOTE — Assessment & Plan Note (Signed)
Well-controlled DM with A1c 5 range, now 5.7 S/p bariatric surgery wt loss Complications - peripheral neuropathy, hyperlipidemia, depression, obesity, OSA  increases risk of future cardiovascular complications   Plan:  1. Discontinue Metformin IR 500mg  daily, given doing so well with management of DM. 2. Encourage improved lifestyle - low carb, low sugar diet, reduce portion size, continue improving regular exercise 3. Check CBG , bring log to next visit for review 4. Continue ARB, Statin Future DM Eye exam, recommended, she can schedule. DM Foot exam today

## 2020-09-25 DIAGNOSIS — G629 Polyneuropathy, unspecified: Secondary | ICD-10-CM | POA: Diagnosis not present

## 2020-09-25 DIAGNOSIS — G2581 Restless legs syndrome: Secondary | ICD-10-CM | POA: Diagnosis not present

## 2020-09-25 DIAGNOSIS — M48061 Spinal stenosis, lumbar region without neurogenic claudication: Secondary | ICD-10-CM | POA: Diagnosis not present

## 2020-10-07 ENCOUNTER — Telehealth: Payer: Self-pay

## 2020-10-07 ENCOUNTER — Other Ambulatory Visit: Payer: Self-pay | Admitting: Family Medicine

## 2020-10-07 DIAGNOSIS — G2581 Restless legs syndrome: Secondary | ICD-10-CM

## 2020-10-07 DIAGNOSIS — G629 Polyneuropathy, unspecified: Secondary | ICD-10-CM

## 2020-10-07 DIAGNOSIS — G8929 Other chronic pain: Secondary | ICD-10-CM

## 2020-10-07 DIAGNOSIS — M5442 Lumbago with sciatica, left side: Secondary | ICD-10-CM

## 2020-10-07 NOTE — Telephone Encounter (Signed)
[facility] called today reporting that pt received a new prescription of gabapentin 300 mg three times a day from Dr. Theora Master.  I told them they could d/c our rx of gabapentin 100 mg.  If something changes we will contact them back.

## 2020-10-07 NOTE — Telephone Encounter (Signed)
Correct. Updated her med rec  Saralyn Pilar, DO [facility] 10/07/2020, 5:15 PM

## 2020-10-16 DIAGNOSIS — M48061 Spinal stenosis, lumbar region without neurogenic claudication: Secondary | ICD-10-CM | POA: Diagnosis not present

## 2020-10-16 DIAGNOSIS — G2581 Restless legs syndrome: Secondary | ICD-10-CM | POA: Diagnosis not present

## 2020-10-16 DIAGNOSIS — G629 Polyneuropathy, unspecified: Secondary | ICD-10-CM | POA: Diagnosis not present

## 2020-10-20 ENCOUNTER — Ambulatory Visit (INDEPENDENT_AMBULATORY_CARE_PROVIDER_SITE_OTHER): Payer: Medicare Other

## 2020-10-20 ENCOUNTER — Telehealth: Payer: Self-pay

## 2020-10-20 VITALS — Ht 62.0 in | Wt 210.0 lb

## 2020-10-20 DIAGNOSIS — Z0000 Encounter for general adult medical examination without abnormal findings: Secondary | ICD-10-CM | POA: Diagnosis not present

## 2020-10-20 DIAGNOSIS — Z13820 Encounter for screening for osteoporosis: Secondary | ICD-10-CM

## 2020-10-20 NOTE — Chronic Care Management (AMB) (Signed)
  Chronic Care Management   Outreach Note  10/20/2020 Name: Janet [name] MRN: [id number] DOB: 01-10-1951  Janet [name] is a 70 y.o. year old female who is a primary care patient of Smitty Cords, DO. I reached out to [name] by phone today in response to a referral sent by Ms. Doree Fudge Deandrade's patient's AWV (annual wellness visit) nurse, Elisha Ponder E,LPN.      An unsuccessful telephone outreach was attempted today. The patient was referred to the case management team for assistance with care management and care coordination.   Follow Up Plan: A HIPAA compliant phone message was left for the patient providing contact information and requesting a return call.  The care management team will reach out to the patient again over the next 7 days.  If patient returns call to provider office, please advise to call Embedded Care Management Care Guide Penne Lash at [phone]  Penne Lash, RMA Care Guide, Embedded Care Coordination [facility]  [place], [place] [address] Direct Dial: [phone] [email or web address]

## 2020-10-20 NOTE — Progress Notes (Signed)
I connected with [name] today by telephone and verified that I am speaking with the correct person using two identifiers. Location patient: home Location provider: work Persons participating in the virtual visit: [name], [name] LPN.   I discussed the limitations, risks, security and privacy concerns of performing an evaluation and management service by telephone and the availability of in person appointments. I also discussed with the patient that there may be a patient responsible charge related to this service. The patient expressed understanding and verbally consented to this telephonic visit.    Interactive audio and video telecommunications were attempted between this provider and patient, however failed, due to patient having technical difficulties OR patient did not have access to video capability.  We continued and completed visit with audio only.     Vital signs may be patient reported or missing.  Subjective:   Janet [name] is a 70 y.o. female who presents for an Initial Medicare Annual Wellness Visit.  Review of Systems     Cardiac Risk Factors include: advanced age (>6men, >52 women);diabetes mellitus;dyslipidemia;obesity (BMI >30kg/m2);sedentary lifestyle     Objective:    Today's Vitals   10/20/20 0856 10/20/20 0857  Weight: 210 lb (95.3 kg)   Height: 5\' 2"  (1.575 m)   PainSc:  4    Body mass index is 38.41 kg/m.  Advanced Directives 10/20/2020  Does Patient Have a Medical Advance Directive? Yes  Type of Advance Directive Living will    Current Medications (verified) Outpatient Encounter Medications as of 10/20/2020  Medication Sig   aspirin 81 MG EC tablet Take by mouth.   atorvastatin (LIPITOR) 40 MG tablet Take 40 mg by mouth at bedtime.   baclofen (LIORESAL) 10 MG tablet Take by mouth.   gabapentin (NEURONTIN) 300 MG capsule Take 300 mg by mouth 3 (three) times daily.   losartan-hydrochlorothiazide (HYZAAR) 100-25 MG tablet Take 1 tablet by  mouth daily.   meloxicam (MOBIC) 15 MG tablet Take by mouth.   metoprolol tartrate (LOPRESSOR) 25 MG tablet Take 1 tablet (25 mg total) by mouth at bedtime.   pramipexole (MIRAPEX) 1 MG tablet Take by mouth.   SHINGRIX injection Inject 0.5 mL into muscle for shingles vaccine. This is 2nd and final dose.   DULoxetine (CYMBALTA) 30 MG capsule Take 1 capsule (30 mg total) by mouth daily. (Patient not taking: Reported on 10/20/2020)   rOPINIRole (REQUIP) 0.5 MG tablet Take 0.5 mg by mouth at bedtime. (Patient not taking: Reported on 10/20/2020)   No facility-administered encounter medications on file as of 10/20/2020.    Allergies (verified) Patient has no known allergies.   History: Past Medical History:  Diagnosis Date   GERD (gastroesophageal reflux disease)    Heart murmur    Hyperlipidemia    Hypertension    PAD (peripheral artery disease) (HCC)    Restless leg    Sleep apnea    Urinary incontinence    Past Surgical History:  Procedure Laterality Date   APPENDECTOMY  2014   CARPAL TUNNEL RELEASE Left 1998   DG C-ARM 2 VIEW LEFT SHOULDER ([facility] HX)     DG C-ARM 2 VIEW RIGHT SHOULDER ([facility] HX)     ESOPHAGEAL DILATION  2012   2015, 2017, 2020   GASTRIC BYPASS  2019   HERNIA REPAIR  2019   left knee manipulation Left 02/05/2019   PACEMAKER IMPLANT  2017   POLYPECTOMY     SHOULDER SURGERY Left 2014   SHOULDER SURGERY Right 2017  TOTAL KNEE ARTHROPLASTY Right 2018   TOTAL KNEE ARTHROPLASTY Left 10/18/2018   vein removal Bilateral 2012   Family History  Problem Relation Age of Onset   Heart attack Mother 2286   Heart disease Father    Thyroid disease Father    Skin cancer Father    Brain cancer Maternal Grandmother    Social History   Socioeconomic History   Marital status: Married    Spouse name: Not on file   Number of children: Not on file   Years of education: Not on file   Highest education level: Not on file  Occupational History   Not on file  Tobacco Use    Smoking status: Former    Packs/day: 1.00    Years: 25.00    Pack years: 25.00    Types: Cigarettes   Smokeless tobacco: Former  Building services engineerVaping Use   Vaping Use: Never used  Substance and Sexual Activity   Alcohol use: Never   Drug use: Never   Sexual activity: Not Currently  Other Topics Concern   Not on file  Social History Narrative   Not on file   Social Determinants of Health   Financial Resource Strain: Low Risk    Difficulty of Paying Living Expenses: Not hard at all  Food Insecurity: No Food Insecurity   Worried About Programme researcher, broadcasting/film/videounning Out of Food in the Last Year: Never true   Ran Out of Food in the Last Year: Never true  Transportation Needs: No Transportation Needs   Lack of Transportation (Medical): No   Lack of Transportation (Non-Medical): No  Physical Activity: Inactive   Days of Exercise per Week: 0 days   Minutes of Exercise per Session: 0 min  Stress: No Stress Concern Present   Feeling of Stress : Not at all  Social Connections: Not on file    Tobacco Counseling Counseling given: Not Answered   Clinical Intake:  Pre-visit preparation completed: Yes  Pain : 0-10 Pain Score: 4  Pain Type: Chronic pain Pain Location: Generalized Pain Descriptors / Indicators: Aching, Sharp Pain Onset: More than a month ago Pain Frequency: Constant     Nutritional Status: BMI > 30  Obese Nutritional Risks: None Diabetes: Yes  How often do you need to have someone help you when you read instructions, pamphlets, or other written materials from your doctor or pharmacy?: 1 - Never What is the last grade level you completed in school?: college  Diabetic? Yes Nutrition Risk Assessment:  Has the patient had any N/V/D within the last 2 months?  No  Does the patient have any non-healing wounds?  No  Has the patient had any unintentional weight loss or weight gain?  No   Diabetes:  Is the patient diabetic?  Yes  If diabetic, was a CBG obtained today?  No  Did the patient  bring in their glucometer from home?  No  How often do you monitor your CBG's? Does not check.   Financial Strains and Diabetes Management:  Are you having any financial strains with the device, your supplies or your medication? No .  Does the patient want to be seen by Chronic Care Management for management of their diabetes?  No  Would the patient like to be referred to a Nutritionist or for Diabetic Management?  No   Diabetic Exams:  Diabetic Eye Exam: Overdue for diabetic eye exam. Pt has been advised about the importance in completing this exam. Patient advised to call and schedule an eye exam.  Diabetic Foot Exam: Completed 09/16/2020   Interpreter Needed?: No  Information entered by :: NAllen LPN   Activities of Daily Living In your present state of health, do you have any difficulty performing the following activities: 10/20/2020  Hearing? N  Vision? N  Difficulty concentrating or making decisions? Y  Comment remembering  Walking or climbing stairs? Y  Dressing or bathing? N  Doing errands, shopping? N  Preparing Food and eating ? N  Using the Toilet? N  In the past six months, have you accidently leaked urine? N  Do you have problems with loss of bowel control? N  Managing your Medications? Y  Comment forgets sometimes  Managing your Finances? N  Housekeeping or managing your Housekeeping? N  Some recent data might be hidden    Patient Care Team: Smitty Cords, DO as PCP - General (Family Medicine)  Indicate any recent Medical Services you may have received from other than [facility] providers in the past year (date may be approximate).     Assessment:   This is a routine wellness examination for Jacarra.  Hearing/Vision screen Vision Screening - Comments:: Regular eye exams, [facility]  Dietary issues and exercise activities discussed: Current Exercise Habits: The patient does not participate in regular exercise at present   Goals Addressed              This Visit's Progress    Patient Stated       10/20/2020, no goals       Depression Screen PHQ 2/9 Scores 10/20/2020 09/16/2020 03/03/2020 09/19/2019 08/20/2019  PHQ - 2 Score 4 2 0 0 2  PHQ- 9 Score 10 7 0 0 8    Fall Risk Fall Risk  10/20/2020 09/16/2020 09/19/2019  Falls in the past year? 1 1 0  Comment lost balance, trips - -  Number falls in past yr: 1 1 0  Injury with Fall? 0 0 0  Risk for fall due to : History of fall(s);Impaired balance/gait;Medication side effect - -  Follow up Falls evaluation completed;Education provided;Falls prevention discussed Falls evaluation completed Falls evaluation completed    FALL RISK PREVENTION PERTAINING TO THE HOME:  Any stairs in or around the home? No  If so, are there any without handrails?  Has ramp Home free of loose throw rugs in walkways, pet beds, electrical cords, etc? Yes  Adequate lighting in your home to reduce risk of falls? Yes   ASSISTIVE DEVICES UTILIZED TO PREVENT FALLS:  Life alert? No  Use of a cane, walker or w/c? Yes  Grab bars in the bathroom? Yes  Shower chair or bench in shower? Yes  Elevated toilet seat or a handicapped toilet? No   TIMED UP AND GO:  Was the test performed? No .      Cognitive Function:     6CIT Screen 10/20/2020  What Year? 0 points  What month? 0 points  What time? 0 points  Count back from 20 0 points  Months in reverse 0 points  Repeat phrase 4 points  Total Score 4    Immunizations Immunization History  Administered Date(s) Administered   Fluad Quad(high Dose 65+) 03/03/2020   Influenza, High Dose Seasonal PF 01/09/2018, 11/21/2018, 11/21/2018   Influenza, Seasonal, Injecte, Preservative Fre 12/16/2014   Moderna Sars-Covid-2 Vaccination 03/27/2019, 04/26/2019   PFIZER(Purple Top)SARS-COV-2 Vaccination 01/09/2020, 07/08/2020   Pneumococcal Conjugate-13 01/07/2018   Pneumococcal Polysaccharide-23 09/19/2019   Zoster Recombinat (Shingrix) 07/09/2020, 09/21/2020    TDAP  status: Due, Education  has been provided regarding the importance of this vaccine. Advised may receive this vaccine at local pharmacy or Health Dept. Aware to provide a copy of the vaccination record if obtained from local pharmacy or Health Dept. Verbalized acceptance and understanding.  Flu Vaccine status: Due, Education has been provided regarding the importance of this vaccine. Advised may receive this vaccine at local pharmacy or Health Dept. Aware to provide a copy of the vaccination record if obtained from local pharmacy or Health Dept. Verbalized acceptance and understanding.  Pneumococcal vaccine status: Up to date  Covid-19 vaccine status: Completed vaccines  Qualifies for Shingles Vaccine? Yes   Zostavax completed No   Shingrix Completed?: Yes  Screening Tests Health Maintenance  Topic Date Due   TETANUS/TDAP  Never done   DEXA SCAN  Never done   OPHTHALMOLOGY EXAM  06/12/2020   INFLUENZA VACCINE  09/28/2020   Hepatitis C Screening  09/17/2023 (Originally 01/08/1969)   COVID-19 Vaccine (5 - Booster) 11/08/2020   MAMMOGRAM  02/12/2021   HEMOGLOBIN A1C  03/12/2021   FOOT EXAM  09/16/2021   COLONOSCOPY (Pts 45-3yrs Insurance coverage will need to be confirmed)  01/17/2022   PNA vac Low Risk Adult  Completed   Zoster Vaccines- Shingrix  Completed   HPV VACCINES  Aged Out    Health Maintenance  Health Maintenance Due  Topic Date Due   TETANUS/TDAP  Never done   DEXA SCAN  Never done   OPHTHALMOLOGY EXAM  06/12/2020   INFLUENZA VACCINE  09/28/2020    Colorectal cancer screening: Type of screening: Colonoscopy. Completed 01/17/2017. Repeat every 5 years  Mammogram status: Completed 12/16/202. Repeat every 2 years  Bone Density status: Ordered today. Pt provided with contact info and advised to call to schedule appt.  Lung Cancer Screening: (Low Dose CT Chest recommended if Age 49-80 years, 30 pack-year currently smoking OR have quit w/in 15years.) does not qualify.    Lung Cancer Screening Referral: no  Additional Screening:  Hepatitis C Screening: does qualify; decline  Vision Screening: Recommended annual ophthalmology exams for early detection of glaucoma and other disorders of the eye. Is the patient up to date with their annual eye exam?  No  Who is the provider or what is the name of the office in which the patient attends annual eye exams? [facility] If pt is not established with a provider, would they like to be referred to a provider to establish care? No .   Dental Screening: Recommended annual dental exams for proper oral hygiene  Community Resource Referral / Chronic Care Management: CRR required this visit?  Yes   CCM required this visit?  No      Plan:     I have personally reviewed and noted the following in the patient's chart:   Medical and social history Use of alcohol, tobacco or illicit drugs  Current medications and supplements including opioid prescriptions. Patient is not currently taking opioid prescriptions. Functional ability and status Nutritional status Physical activity Advanced directives List of other physicians Hospitalizations, surgeries, and ER visits in previous 12 months Vitals Screenings to include cognitive, depression, and falls Referrals and appointments  In addition, I have reviewed and discussed with patient certain preventive protocols, quality metrics, and best practice recommendations. A written personalized care plan for preventive services as well as general preventive health recommendations were provided to patient.     Barb Merino, LPN   9/48/0165   Nurse Notes:

## 2020-10-20 NOTE — Patient Instructions (Signed)
Janet [name] , Thank you for taking time to come for your Medicare Wellness Visit. I appreciate your ongoing commitment to your health goals. Please review the following plan we discussed and let me know if I can assist you in the future.   Screening recommendations/referrals: Colonoscopy: completed 01/17/2017 Mammogram: due Bone Density: ordered today Recommended yearly ophthalmology/optometry visit for glaucoma screening and checkup Recommended yearly dental visit for hygiene and checkup  Vaccinations: Influenza vaccine: due Pneumococcal vaccine: completed 09/19/2019 Tdap vaccine: due Shingles vaccine: completed    Covid-19:07/08/2020, 01/09/2020, 04/26/2019, 03/27/2019  Advanced directives: Please bring a copy of your POA (Power of Attorney) and/or Living Will to your next appointment.   Conditions/risks identified: none  Next appointment: Follow up in one year for your annual wellness visit    Preventive Care 65 Years and Older, Female Preventive care refers to lifestyle choices and visits with your health care provider that can promote health and wellness. What does preventive care include? A yearly physical exam. This is also called an annual well check. Dental exams once or twice a year. Routine eye exams. Ask your health care provider how often you should have your eyes checked. Personal lifestyle choices, including: Daily care of your teeth and gums. Regular physical activity. Eating a healthy diet. Avoiding tobacco and drug use. Limiting alcohol use. Practicing safe sex. Taking low-dose aspirin every day. Taking vitamin and mineral supplements as recommended by your health care provider. What happens during an annual well check? The services and screenings done by your health care provider during your annual well check will depend on your age, overall health, lifestyle risk factors, and family history of disease. Counseling  Your health care provider may ask you questions  about your: Alcohol use. Tobacco use. Drug use. Emotional well-being. Home and relationship well-being. Sexual activity. Eating habits. History of falls. Memory and ability to understand (cognition). Work and work Astronomer. Reproductive health. Screening  You may have the following tests or measurements: Height, weight, and BMI. Blood pressure. Lipid and cholesterol levels. These may be checked every 5 years, or more frequently if you are over 62 years old. Skin check. Lung cancer screening. You may have this screening every year starting at age 23 if you have a 30-pack-year history of smoking and currently smoke or have quit within the past 15 years. Fecal occult blood test (FOBT) of the stool. You may have this test every year starting at age 104. Flexible sigmoidoscopy or colonoscopy. You may have a sigmoidoscopy every 5 years or a colonoscopy every 10 years starting at age 44. Hepatitis C blood test. Hepatitis B blood test. Sexually transmitted disease (STD) testing. Diabetes screening. This is done by checking your blood sugar (glucose) after you have not eaten for a while (fasting). You may have this done every 1-3 years. Bone density scan. This is done to screen for osteoporosis. You may have this done starting at age 75. Mammogram. This may be done every 1-2 years. Talk to your health care provider about how often you should have regular mammograms. Talk with your health care provider about your test results, treatment options, and if necessary, the need for more tests. Vaccines  Your health care provider may recommend certain vaccines, such as: Influenza vaccine. This is recommended every year. Tetanus, diphtheria, and acellular pertussis (Tdap, Td) vaccine. You may need a Td booster every 10 years. Zoster vaccine. You may need this after age 55. Pneumococcal 13-valent conjugate (PCV13) vaccine. One dose is recommended after age  65. Pneumococcal polysaccharide (PPSV23)  vaccine. One dose is recommended after age 25. Talk to your health care provider about which screenings and vaccines you need and how often you need them. This information is not intended to replace advice given to you by your health care provider. Make sure you discuss any questions you have with your health care provider. Document Released: 03/13/2015 Document Revised: 11/04/2015 Document Reviewed: 12/16/2014 Elsevier Interactive Patient Education  2017 [place] Prevention in the Home Falls can cause injuries. They can happen to people of all ages. There are many things you can do to make your home safe and to help prevent falls. What can I do on the outside of my home? Regularly fix the edges of walkways and driveways and fix any cracks. Remove anything that might make you trip as you walk through a door, such as a raised step or threshold. Trim any bushes or trees on the path to your home. Use bright outdoor lighting. Clear any walking paths of anything that might make someone trip, such as rocks or tools. Regularly check to see if handrails are loose or broken. Make sure that both sides of any steps have handrails. Any raised decks and porches should have guardrails on the edges. Have any leaves, snow, or ice cleared regularly. Use sand or salt on walking paths during winter. Clean up any spills in your garage right away. This includes oil or grease spills. What can I do in the bathroom? Use night lights. Install grab bars by the toilet and in the tub and shower. Do not use towel bars as grab bars. Use non-skid mats or decals in the tub or shower. If you need to sit down in the shower, use a plastic, non-slip stool. Keep the floor dry. Clean up any water that spills on the floor as soon as it happens. Remove soap buildup in the tub or shower regularly. Attach bath mats securely with double-sided non-slip rug tape. Do not have throw rugs and other things on the floor that  can make you trip. What can I do in the bedroom? Use night lights. Make sure that you have a light by your bed that is easy to reach. Do not use any sheets or blankets that are too big for your bed. They should not hang down onto the floor. Have a firm chair that has side arms. You can use this for support while you get dressed. Do not have throw rugs and other things on the floor that can make you trip. What can I do in the kitchen? Clean up any spills right away. Avoid walking on wet floors. Keep items that you use a lot in easy-to-reach places. If you need to reach something above you, use a strong step stool that has a grab bar. Keep electrical cords out of the way. Do not use floor polish or wax that makes floors slippery. If you must use wax, use non-skid floor wax. Do not have throw rugs and other things on the floor that can make you trip. What can I do with my stairs? Do not leave any items on the stairs. Make sure that there are handrails on both sides of the stairs and use them. Fix handrails that are broken or loose. Make sure that handrails are as long as the stairways. Check any carpeting to make sure that it is firmly attached to the stairs. Fix any carpet that is loose or worn. Avoid having throw rugs at  the top or bottom of the stairs. If you do have throw rugs, attach them to the floor with carpet tape. Make sure that you have a light switch at the top of the stairs and the bottom of the stairs. If you do not have them, ask someone to add them for you. What else can I do to help prevent falls? Wear shoes that: Do not have high heels. Have rubber bottoms. Are comfortable and fit you well. Are closed at the toe. Do not wear sandals. If you use a stepladder: Make sure that it is fully opened. Do not climb a closed stepladder. Make sure that both sides of the stepladder are locked into place. Ask someone to hold it for you, if possible. Clearly mark and make sure that you  can see: Any grab bars or handrails. First and last steps. Where the edge of each step is. Use tools that help you move around (mobility aids) if they are needed. These include: Canes. Walkers. Scooters. Crutches. Turn on the lights when you go into a dark area. Replace any light bulbs as soon as they burn out. Set up your furniture so you have a clear path. Avoid moving your furniture around. If any of your floors are uneven, fix them. If there are any pets around you, be aware of where they are. Review your medicines with your doctor. Some medicines can make you feel dizzy. This can increase your chance of falling. Ask your doctor what other things that you can do to help prevent falls. This information is not intended to replace advice given to you by your health care provider. Make sure you discuss any questions you have with your health care provider. Document Released: 12/11/2008 Document Revised: 07/23/2015 Document Reviewed: 03/21/2014 Elsevier Interactive Patient Education  2017 [employer].

## 2020-10-28 NOTE — Chronic Care Management (AMB) (Signed)
  Chronic Care Management   Outreach Note  10/28/2020 Name: Janet [name] MRN: [id number] DOB: 1951/01/24  Janet [name] is a 70 y.o. year old female who is a primary care patient of Smitty Cords, DO. I reached out to Janet [name] by phone today in response to a referral sent by Janet [name]'s patient's AWV (annual wellness visit) nurse, Janet [name] [name],Janet [name] .      A second unsuccessful telephone outreach was attempted today. The patient was referred to the case management team for assistance with care management and care [name].   Follow Up Plan: A HIPAA compliant phone message was left for the patient providing contact information and requesting a return call.  The care management team will reach out to the patient again over the next 7 days.  If patient returns call to provider office, please advise to call Janet Care Management Care [name] Janet [name]  at [phone]  Janet [name], Janet [name], Janet [name] [facility]  [place], [place] [address] Direct Dial: [phone] [name] Website: [email or web address]

## 2020-11-11 NOTE — Chronic Care Management (AMB) (Signed)
  Chronic Care Management   Note  11/11/2020 Name: Angeliz Settlemyre MRN: [id number] DOB: 15-Aug-1950  Starlee Corralejo is a 70 y.o. year old female who is a primary care patient of Olin Hauser, DO. I reached out to Delbert Phenix by phone today in response to a referral sent by Ms. Maudry Mayhew Lowder's patient's AWV (annual wellness visit) nurse, Kellie Simmering, LPN.      Ms. Walth was given information about Chronic Care Management services today including:  CCM service includes personalized support from designated clinical staff supervised by her physician, including individualized plan of care and coordination with other care providers 24/7 contact phone numbers for assistance for urgent and routine care needs. Service will only be billed when office clinical staff spend 20 minutes or more in a month to coordinate care. Only one practitioner may furnish and bill the service in a calendar month. The patient may stop CCM services at any time (effective at the end of the month) by phone call to the office staff. The patient will be responsible for cost sharing (co-pay) of up to 20% of the service fee (after annual deductible is met).  Patient agreed to services and verbal consent obtained.   Follow up plan: Telephone appointment with care management team member scheduled for:11/16/2020  Noreene Larsson, [address], [place], [place] [address] Direct Dial: [phone] [email or web address] Website: [email or web address]

## 2020-11-16 ENCOUNTER — Telehealth: Payer: Medicare Other | Admitting: General Practice

## 2020-11-16 ENCOUNTER — Telehealth: Payer: Self-pay

## 2020-11-16 ENCOUNTER — Ambulatory Visit (INDEPENDENT_AMBULATORY_CARE_PROVIDER_SITE_OTHER): Payer: Medicare Other

## 2020-11-16 DIAGNOSIS — F331 Major depressive disorder, recurrent, moderate: Secondary | ICD-10-CM

## 2020-11-16 DIAGNOSIS — M8949 Other hypertrophic osteoarthropathy, multiple sites: Secondary | ICD-10-CM

## 2020-11-16 DIAGNOSIS — E1169 Type 2 diabetes mellitus with other specified complication: Secondary | ICD-10-CM

## 2020-11-16 DIAGNOSIS — G8929 Other chronic pain: Secondary | ICD-10-CM

## 2020-11-16 DIAGNOSIS — E785 Hyperlipidemia, unspecified: Secondary | ICD-10-CM

## 2020-11-16 DIAGNOSIS — M159 Polyosteoarthritis, unspecified: Secondary | ICD-10-CM

## 2020-11-16 DIAGNOSIS — G2581 Restless legs syndrome: Secondary | ICD-10-CM

## 2020-11-16 DIAGNOSIS — F3342 Major depressive disorder, recurrent, in full remission: Secondary | ICD-10-CM

## 2020-11-16 DIAGNOSIS — F411 Generalized anxiety disorder: Secondary | ICD-10-CM

## 2020-11-16 DIAGNOSIS — M5442 Lumbago with sciatica, left side: Secondary | ICD-10-CM

## 2020-11-16 NOTE — Chronic Care Management (AMB) (Signed)
Chronic Care Management   CCM RN Visit Note  11/16/2020 Name: Janet [name] MRN: [id number] DOB: [birth year]  Subjective: Janet [name] is a 70 y.o. year old female who is a primary care patient of Smitty Cords, DO. The care management team was consulted for assistance with disease management and care coordination needs.    Engaged with patient by telephone for follow up visit in response to provider referral for case management and/or care coordination services.   Consent to Services:  The patient was given information about Chronic Care Management services, agreed to services, and gave verbal consent prior to initiation of services.  Please see initial visit note for detailed documentation.   Patient agreed to services and verbal consent obtained.   Assessment: Review of patient past medical history, allergies, medications, health status, including review of consultants reports, laboratory and other test data, was performed as part of comprehensive evaluation and provision of chronic care management services.   SDOH (Social Determinants of Health) assessments and interventions performed:  SDOH Interventions    Flowsheet Row Most Recent Value  SDOH Interventions   Food Insecurity Interventions Intervention Not Indicated  Intimate Partner Violence Interventions Intervention Not Indicated  Physical Activity Interventions Other (Comments)  [no structured activities]  Social Connections Interventions Other (Comment)  [good support system, is moving soon]  Transportation Interventions Intervention Not Indicated        CCM Care Plan  No Known Allergies  Outpatient Encounter Medications as of 11/16/2020  Medication Sig Note   aspirin 81 MG EC tablet Take by mouth.    atorvastatin (LIPITOR) 40 MG tablet Take 40 mg by mouth at bedtime.    baclofen (LIORESAL) 10 MG tablet Take by mouth.    DULoxetine (CYMBALTA) 30 MG capsule Take 1 capsule (30 mg total) by mouth daily.     gabapentin (NEURONTIN) 300 MG capsule Take 300 mg by mouth 3 (three) times daily. 11/16/2020: Taking 2 times a day because forgets   losartan-hydrochlorothiazide (HYZAAR) 100-25 MG tablet Take 1 tablet by mouth daily.    metoprolol tartrate (LOPRESSOR) 25 MG tablet Take 1 tablet (25 mg total) by mouth at bedtime.    pramipexole (MIRAPEX) 1 MG tablet Take by mouth.    SHINGRIX injection Inject 0.5 mL into muscle for shingles vaccine. This is 2nd and final dose.    meloxicam (MOBIC) 15 MG tablet Take by mouth.    rOPINIRole (REQUIP) 0.5 MG tablet Take 0.5 mg by mouth at bedtime. (Patient not taking: No sig reported)    No facility-administered encounter medications on file as of 11/16/2020.    Patient Active Problem List   Diagnosis Date Noted   Chronic left-sided low back pain with left-sided sciatica 04/14/2020   History of deep vein thrombosis 09/16/2019   Type 2 diabetes mellitus with other specified complication (HCC) 08/20/2019   Restless leg syndrome 08/20/2019   Major depression, recurrent, full remission (HCC) 08/20/2019   Other insomnia 08/20/2019   Symptomatic varicose veins, bilateral 08/20/2019   Edema of left lower extremity 08/20/2019   Primary osteoarthritis involving multiple joints 08/20/2019   Venous insufficiency of both lower extremities 08/20/2019   History of bariatric surgery 08/20/2019   Cardiac pacemaker 08/20/2019   Anxiety disorder 10/31/2018   Edema 10/31/2018   Heart murmur 10/31/2018   Osteoporosis 10/31/2018   Primary osteoarthritis of left knee 10/19/2018   History of pacemaker 09/12/2018   Shortness of breath 09/12/2018   Palpitations 06/20/2016   Postmenopausal bleeding 08/04/2015   Lymphedema  12/16/2014   Osteoarthritis 07/31/2014   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   Gastroesophageal reflux disease 07/31/2014   Mixed hyperlipidemia 07/31/2014   Morbid obesity (HCC) 07/31/2014   Obstructive sleep apnea syndrome 07/31/2014   Peripheral nerve disease  07/31/2014   Urinary incontinence 07/31/2014   History of adenomatous polyp of colon 02/28/2014    Conditions to be addressed/monitored:HLD, DMII, Anxiety, Depression, and Chronic pain and falls prevention and safety  Care Plan : RNCM: Adult plan of care for  Chronic Disease Management and Care Coordination Needs  Updates made by Marlowe Sax, RN since 11/16/2020 12:00 AM     Problem: RNCM: Adult plan of care for Chronic Disease Management and Care Coordination Needs   Priority: High  Onset Date: 11/16/2020     Long-Range Goal: RNCM: Adult Plan of Care for Chronic Disease Management and Care Coordination Needs   Start Date: 11/16/2020  Expected End Date: 11/16/2021  This Visit's Progress: Not on track  Priority: High  Note:   Current Barriers:  Knowledge Deficits related to plan of care for management of HLD, DMII, Anxiety with family circumstances and not caring for self, Depression: depressed mood, and chronic pain (RLS, OA, Chronic back pain),l and falls and safety concerns insomnia, fatigue, anxiety, disturbed sleep,, Caregiver Stress, Grief, Insomnia/Sleep Difficulties, and Stress at family circumstances, and grief process over the loss of her oldest daughter to COVID in October 2021  Care Coordination needs related to Limited social support, Level of care concerns, Mental Health Concerns , and Family and relationship dysfunction  Chronic Disease Management support and education needs related to HLD, DMII, Anxiety with Social Anxiety,, Depression: depressed mood, falls prevention and safety and chronic pain  insomnia, fatigue, anxiety,, Caregiver Stress, Grief, Mood Instability, and Stress at family situations and circumstances, and grief at the loss of her oldest daughter last year in October of 2021 Lacks caregiver support.  Non-adherence to prescribed medication regimen  RNCM Clinical Goal(s):  Patient will verbalize understanding of plan for management of HLD, DMII,  Anxiety, Depression,  falls prevention, and Chronic pain  verbalize basic understanding of HLD, DMII, Anxiety, Depression, falls prevention, and Chronic pain  disease process and self health management plan  take all medications exactly as prescribed and will call provider for medication related questions demonstrate understanding of rationale for each prescribed medication patient desires to work with pharm D for effective management of chronic conditions and how to take her medications as prescribed demonstrate improved and ongoing adherence to prescribed treatment plan for HLD, DMII, Anxiety, Depression, falls prevention, and chronic pain  as evidenced by adherence to ADA/ carb modified diet adherence to prescribed medication regimen contacting provider for new or worsened symptoms or questions and working with the CCM team for effective management of chronic diseases  demonstrate improved and ongoing health management independence by remaining safe in home environment and effective management of Chronic conditions  continue to work with Medical illustrator to address care management and care coordination needs related to HLD, DMII, Anxiety, Depression, and chronic pain and fall prevention   work with pharmacist to address medication concerns as the patient admits she does not take her medications as prescribed and struggles with managing her medications related to HLD, DMII, Anxiety, Depression, and chronic pain and falls prevention and safety through collaboration with RN Care manager, provider, and care team.   Interventions: 1:1 collaboration with primary care provider regarding development and update of comprehensive plan of care as evidenced by provider attestation and co-signature Inter-disciplinary  care team collaboration (see longitudinal plan of care) Evaluation of current treatment plan related to  self management and patient's adherence to plan as established by provider   SDOH Barriers  (Status: New goal. Goal on track: YES.)  Patient interviewed and SDOH assessment performed        SDOH Interventions    Flowsheet Row Most Recent Value  SDOH Interventions   Food Insecurity Interventions Intervention Not Indicated  Intimate Partner Violence Interventions Intervention Not Indicated  Physical Activity Interventions Other (Comments)  [no structured activities]  Social Connections Interventions Other (Comment)  [good support system, is moving soon]  Transportation Interventions Intervention Not Indicated     Patient interviewed and appropriate assessments performed Provided patient with information about CCM team and working with the patient to manage chronic conditions and effectively manage health and well being. The patient wants to be proactive in her care and is willing to work with the team Discussed plans with patient for ongoing care management follow up and provided patient with direct contact information for care management team Advised patient to call the office for changes in condition or SDOH needs. The patient states that she is hoping by working with the CCM team she can remember best how to take her medications and take care of herself beter.  Collaborated with RN Case Manager re: ongoing support and education for management of chronic conditions and health and well being goals.     Diabetes:  (Status: New goal. Goal on track: YES.) Lab Results  Component Value Date   HGBA1C 5.7 (H) 09/09/2020  Assessed patient's understanding of A1c goal: <7% Provided education to patient about basic DM disease process; Reviewed prescribed diet with patient 11-16-2020: Review of heart healthy/ADA diet. The patient eats small meals and sometimes is not eating what she should. Sometimes eats a bedtime snack sometimes not. The patient encouraged to follow a heart healthy/ADA diet. Will send information by my chart system for review and help with meal choices and options.  ; Counseled on importance of regular laboratory monitoring as prescribed;        Discussed plans with patient for ongoing care management follow up and provided patient with direct contact information for care management team;      Provided patient with written educational materials related to hypo and hyperglycemia and importance of correct treatment. 11-16-2020: Reviewed with the patient the sx and sx of hypo and hyperglycemia. The patient does not check her blood sugars as recommended by the provider. She does have a meter but does not take medications for DM and mainly controls DM with diet. Education and support given. The patient states that she will start taking blood sugars if she feels different. Denies lows; however states that sometimes at night she experiences "sweating". Eduction and support given. Will continue to monitor;       Review of patient status, including review of consultants reports, relevant laboratory and other test results, and medications completed;        Falls:  (Status: New goal.) Provided written and verbal education re: potential causes of falls and Fall prevention strategies Reviewed medications and discussed potential side effects of medications such as dizziness and frequent urination. 9-19-202: The patient reviewed with the RNCM medications that she takes. Is following up with the pharm D as she forgets to take medications. Will monitor for medications that may cause her to have issues with fall hazards.  Advised patient of importance of notifying provider of falls Assessed for signs  and symptoms of orthostatic hypotension. 9-19-202: Denies any issues with orthostatic hypotension. Takes blood pressures sometimes at home. Advised the patient to take blood pressures and record.  Assessed for falls since last encounter. 11-16-2020: The patient states that she has a fall about once a month. Last fall earlier this month. States no injuries. The patient states that she uses a  cane when ambulating but sometimes falls because she doesn't use her cane. She is always cooking and cleaning and doing for others. She states its easy for her not to take good care of herself. Assessed patients knowledge of fall risk prevention secondary to previously provided education Advised patient to discuss new falls, concerns, or questions  with provider  Depression and anxiety   (Status: New goal.) Evaluation of current treatment plan related to Anxiety and Depression, Limited social support, Level of care concerns, Mental Health Concerns , and Family and relationship dysfunction self-management and patient's adherence to plan as established by provider. Discussed plans with patient for ongoing care management follow up and provided patient with direct contact information for care management team Advised patient to call the office for changes in mood, axiety, depression or other mental health concerns; Provided education to patient re: managing stressors in life and working with the CCM team to effectively manage her depression and anxiety ; Reviewed medications with patient and discussed compliance. The patient sometimes forgets to take her medications. Is open to ideas to help her with effective management of medications. ; Collaborated with CCM team  regarding complex health needs and the patient wanting help in management of her chronic conditions. The patient states that her and her older daughter are going to be moving in about a month. She feels this will help her a whole lot as she is really stressed over cooking, cleaning and helping care for so many family members. She often feels overwhelmed and knows she is not taking care of herself. ; Provided patient with depression and anxiety  educational materials related to relieving stressors in her life; Social Work referral for help with ongoing support and stress relief measures for effective management of mental health well beig  ; Pharmacy referral for help with management of medications and help with system that will help her ; Discussed plans with patient for ongoing care management follow up and provided patient with direct contact information for care management team; Advised patient to discuss new changes and stressors  with provider; Screening for signs and symptoms of depression related to chronic disease state;  Assessed social determinant of health barriers;   Hyperlipidemia:  (Status: New goal. Goal on track: YES.) Lab Results  Component Value Date   CHOL 133 09/09/2020   HDL 52 09/09/2020   LDLCALC 67 09/09/2020   TRIG 56 09/09/2020   CHOLHDL 2.6 09/09/2020     Medication review performed; medication list updated in electronic medical record.  Provider established cholesterol goals reviewed; Counseled on importance of regular laboratory monitoring as prescribed; Provided HLD educational materials; Reviewed role and benefits of statin for ASCVD risk reduction; Discussed strategies to manage statin-induced myalgias; Reviewed importance of limiting foods high in cholesterol; Reviewed exercise goals and target of 150 minutes per week;  Pain:  (Status: New goal.) Pain assessment performed Medications reviewed Reviewed provider established plan for pain management. 11-16-2020: the patient states that she does take Tylenol for pain relief and discomfort at times. She stays very busy and she states she feels somewhat overwhelmed. She rates her pain today at a  4 on a scale of 0-10 Discussed importance of adherence to all scheduled medical appointments; Counseled on the importance of reporting any/all new or changed pain symptoms or management strategies to pain management provider; Advised patient to report to care team affect of pain on daily activities; Discussed use of relaxation techniques and/or diversional activities to assist with pain reduction (distraction, imagery, relaxation, massage,  acupressure, TENS, heat, and cold application; Reviewed with patient prescribed pharmacological and nonpharmacological pain relief strategies; Advised patient to discuss call the provider for changes in level or intensity of pain and discomfort with provider;  Patient Goals/Self-Care Activities: Patient will self administer medications as prescribed Patient will attend all scheduled provider appointments Patient will call pharmacy for medication refills Patient will attend church or other social activities Patient will continue to perform ADL's independently Patient will continue to perform IADL's independently Patient will call provider office for new concerns or questions Patient will work with BSW to address care coordination needs and will continue to work with the clinical team to address health care and disease management related needs.         Plan:Telephone follow up appointment with care management team member scheduled for:  11-30-2020 at 1030 am  Alto Denver RN, MSN, CCM Community Care Coordinator [facility]  [facility] [place] Mobile: [phone]

## 2020-11-16 NOTE — Patient Instructions (Signed)
Visit Information   PATIENT GOALS:   Goals Addressed             This Visit's Progress    RNCM: Cope with Pain-Osteoarthritis       Follow Up Date 11/30/2020    - learn how to meditate - learn relaxation techniques - practice acceptance of chronic pain - spend time with positive people - tell myself I can (not I can't) - think of new ways to do favorite things - use distraction techniques - use relaxation during pain    Why is this important?   Living with joint pain and enjoying your life may be hard.  Feelings like depression or anger can make your pain worse.  Learning ways to cope may help you find some relief from the pain.    Notes: 11-16-2020: The patient states she takes medications and Tylenol if needed for pain relief. Rates her pain level at a 4 today on a scale of 0-10. States that she is having an okay day today. Denies any acute distress. Will continue to monitor for changes in condition.      RNCM: Manage My Emotions       Timeframe:  Long-Range Goal Priority:  High Start Date:       11-16-2020                      Expected End Date:                      11-16-2021 Follow Up Date 11/30/2020    - call and visit an old friend - join a support group - laugh; watch a funny movie or comedian - learn and use visualization or guided imagery - perform a random act of kindness - practice relaxation or meditation daily - talk about feelings with a friend, family or spiritual advisor - practice positive thinking and self-talk    Why is this important?   When you are stressed, down or upset, your body reacts too.  For example, your blood pressure may get higher; you may have a headache or stomachache.  When your emotions get the best of you, your body's ability to fight off cold and flu gets weak.  These steps will help you manage your emotions.     Notes: 11-16-2020: The patient states her depression goes up and down. She feels if she managed her medications better  she would do better at managing her chronic conditions. She states that she is going to be moving soon and feels this will also help. She feels overwhelmed at this time because she is cooking and cleaning and there are several people in the household. Will continue to monitor.      RNCM: Manage My Medicine       Timeframe:  Long-Range Goal Priority:  High Start Date:      11-16-2020                       Expected End Date:      11-16-2021                 Follow Up Date 11/30/2020    - call for medicine refill 2 or 3 days before it runs out - call if I am sick and can't take my medicine - keep a list of all the medicines I take; vitamins and herbals too - learn to read medicine labels - use a  pillbox to sort medicine - use an alarm clock or phone to remind me to take my medicine    Why is this important?   These steps will help you keep on track with your medicines.   Notes: 11-16-2020: The patient admits that she forgets to take her medications at times. States this is the biggest issue that she has. States that she feels she would do a lot better with her health and well being if she were able to manage her medications better and take them as prescribed. The patient is receptive to talking to the pharm D for pill packaging system, reconciliation, and education. Will continue to work with [facility] as well.      RNCM: Prevent Falls and Injury       Follow Up Date 11-30-2020   - add more outdoor lighting - always use handrails on the stairs - always wear low-heeled or flat shoes or slippers with nonskid soles - call the doctor if I am feeling too drowsy - install bathroom grab bars - join an exercise group in my community - keep a flashlight by the bed - keep my cell phone with me always - learn how to get back up if I fall - make an emergency alert plan in case I fall - pick up clutter from the floors - use a nonslip pad with throw rugs, or remove them completely - use a cane or walker -  use a nightlight in the bathroom    Why is this important?   Most falls happen when it is hard for you to walk safely. Your balance may be off because of an illness. You may have pain in your knees, hip or other joints.  You may be overly tired or taking medicines that make you sleepy. You may not be able to see or hear clearly.  Falls can lead to broken bones, bruises or other injuries.  There are things you can do to help prevent falling.     Notes: 11-16-2020: The patient uses a cane when ambulating. States that she falls about one time a month. Is trying to be more careful and not have falls. Review of risk of head trauma and fractures for frequent falls. Reminded the patient to use DME when ambulating. Will continue to monitor for changes.          CLINICAL CARE PLAN: Patient Care Plan: RNCM: Adult plan of care for  Chronic Disease Management and Care Coordination Needs     Problem Identified: RNCM: Adult plan of care for Chronic Disease Management and Care Coordination Needs   Priority: High  Onset Date: 11/16/2020     Long-Range Goal: RNCM: Adult Plan of Care for Chronic Disease Management and Care Coordination Needs   Start Date: 11/16/2020  Expected End Date: 11/16/2021  This Visit's Progress: Not on track  Priority: High  Note:   Current Barriers:  Knowledge Deficits related to plan of care for management of HLD, DMII, Anxiety with family circumstances and not caring for self, Depression: depressed mood, and chronic pain (RLS, OA, Chronic back pain),l and falls and safety concerns insomnia, fatigue, anxiety, disturbed sleep,, Caregiver Stress, Grief, Insomnia/Sleep Difficulties, and Stress at family circumstances, and grief process over the loss of her oldest daughter to [place] in October 2021  Care Coordination needs related to Limited social support, Level of care concerns, Mental Health Concerns , and Family and relationship dysfunction  Chronic Disease Management support  and education needs related to  HLD, DMII, Anxiety with Social Anxiety,, Depression: depressed mood, falls prevention and safety and chronic pain  insomnia, fatigue, anxiety,, Caregiver Stress, Grief, Mood Instability, and Stress at family situations and circumstances, and grief at the loss of her oldest daughter last year in October of 2021 Lacks caregiver support.  Non-adherence to prescribed medication regimen  RNCM Clinical Goal(s):  Patient will verbalize understanding of plan for management of HLD, DMII, Anxiety, Depression,  falls prevention, and Chronic pain  verbalize basic understanding of HLD, DMII, Anxiety, Depression, falls prevention, and Chronic pain  disease process and self health management plan  take all medications exactly as prescribed and will call provider for medication related questions demonstrate understanding of rationale for each prescribed medication patient desires to work with pharm D for effective management of chronic conditions and how to take her medications as prescribed demonstrate improved and ongoing adherence to prescribed treatment plan for HLD, DMII, Anxiety, Depression, falls prevention, and chronic pain  as evidenced by adherence to ADA/ carb modified diet adherence to prescribed medication regimen contacting provider for new or worsened symptoms or questions and working with the CCM team for effective management of chronic diseases  demonstrate improved and ongoing health management independence by remaining safe in home environment and effective management of Chronic conditions  continue to work with Consulting civil engineer to address care management and care coordination needs related to HLD, DMII, Anxiety, Depression, and chronic pain and fall prevention   work with pharmacist to address medication concerns as the patient admits she does not take her medications as prescribed and struggles with managing her medications related to HLD, DMII, Anxiety, Depression,  and chronic pain and falls prevention and safety through collaboration with RN Care manager, provider, and care team.   Interventions: 1:1 collaboration with primary care provider regarding development and update of comprehensive plan of care as evidenced by provider attestation and co-signature Inter-disciplinary care team collaboration (see longitudinal plan of care) Evaluation of current treatment plan related to  self management and patient's adherence to plan as established by provider   SDOH Barriers (Status: New goal. Goal on track: YES.)  Patient interviewed and SDOH assessment performed        SDOH Interventions    Flowsheet Row Most Recent Value  SDOH Interventions   Food Insecurity Interventions Intervention Not Indicated  Intimate Partner Violence Interventions Intervention Not Indicated  Physical Activity Interventions Other (Comments)  [no structured activities]  Social Connections Interventions Other (Comment)  [good support system, is moving soon]  Transportation Interventions Intervention Not Indicated     Patient interviewed and appropriate assessments performed Provided patient with information about CCM team and working with the patient to manage chronic conditions and effectively manage health and well being. The patient wants to be proactive in her care and is willing to work with the team Discussed plans with patient for ongoing care management follow up and provided patient with direct contact information for care management team Advised patient to call the office for changes in condition or SDOH needs. The patient states that she is hoping by working with the CCM team she can remember best how to take her medications and take care of herself beter.  Collaborated with RN Case Manager re: ongoing support and education for management of chronic conditions and health and well being goals.     Diabetes:  (Status: New goal. Goal on track: YES.) Lab Results   Component Value Date   HGBA1C 5.7 (H) 09/09/2020  Assessed patient's  understanding of A1c goal: <7% Provided education to patient about basic DM disease process; Reviewed prescribed diet with patient 11-16-2020: Review of heart healthy/ADA diet. The patient eats small meals and sometimes is not eating what she should. Sometimes eats a bedtime snack sometimes not. The patient encouraged to follow a heart healthy/ADA diet. Will send information by my chart system for review and help with meal choices and options. ; Counseled on importance of regular laboratory monitoring as prescribed;        Discussed plans with patient for ongoing care management follow up and provided patient with direct contact information for care management team;      Provided patient with written educational materials related to hypo and hyperglycemia and importance of correct treatment. 11-16-2020: Reviewed with the patient the sx and sx of hypo and hyperglycemia. The patient does not check her blood sugars as recommended by the provider. She does have a meter but does not take medications for DM and mainly controls DM with diet. Education and support given. The patient states that she will start taking blood sugars if she feels different. Denies lows; however states that sometimes at night she experiences "sweating". Eduction and support given. Will continue to monitor;       Review of patient status, including review of consultants reports, relevant laboratory and other test results, and medications completed;        Falls:  (Status: New goal.) Provided written and verbal education re: potential causes of falls and Fall prevention strategies Reviewed medications and discussed potential side effects of medications such as dizziness and frequent urination. 9-19-202: The patient reviewed with the RNCM medications that she takes. Is following up with the pharm D as she forgets to take medications. Will monitor for medications that  may cause her to have issues with fall hazards.  Advised patient of importance of notifying provider of falls Assessed for signs and symptoms of orthostatic hypotension. 9-19-202: Denies any issues with orthostatic hypotension. Takes blood pressures sometimes at home. Advised the patient to take blood pressures and record.  Assessed for falls since last encounter. 11-16-2020: The patient states that she has a fall about once a month. Last fall earlier this month. States no injuries. The patient states that she uses a cane when ambulating but sometimes falls because she doesn't use her cane. She is always cooking and cleaning and doing for others. She states its easy for her not to take good care of herself. Assessed patients knowledge of fall risk prevention secondary to previously provided education Advised patient to discuss new falls, concerns, or questions  with provider  Depression and anxiety   (Status: New goal.) Evaluation of current treatment plan related to Anxiety and Depression, Limited social support, Level of care concerns, Mental Health Concerns , and Family and relationship dysfunction self-management and patient's adherence to plan as established by provider. Discussed plans with patient for ongoing care management follow up and provided patient with direct contact information for care management team Advised patient to call the office for changes in mood, axiety, depression or other mental health concerns; Provided education to patient re: managing stressors in life and working with the CCM team to effectively manage her depression and anxiety ; Reviewed medications with patient and discussed compliance. The patient sometimes forgets to take her medications. Is open to ideas to help her with effective management of medications. ; Collaborated with CCM team  regarding complex health needs and the patient wanting help in management of her  chronic conditions. The patient states that her  and her older daughter are going to be moving in about a month. She feels this will help her a whole lot as she is really stressed over cooking, cleaning and helping care for so many family members. She often feels overwhelmed and knows she is not taking care of herself. ; Provided patient with depression and anxiety  educational materials related to relieving stressors in her life; Social Work referral for help with ongoing support and stress relief measures for effective management of mental health well beig ; Pharmacy referral for help with management of medications and help with system that will help her ; Discussed plans with patient for ongoing care management follow up and provided patient with direct contact information for care management team; Advised patient to discuss new changes and stressors  with provider; Screening for signs and symptoms of depression related to chronic disease state;  Assessed social determinant of health barriers;   Hyperlipidemia:  (Status: New goal. Goal on track: YES.) Lab Results  Component Value Date   CHOL 133 09/09/2020   HDL 52 09/09/2020   LDLCALC 67 09/09/2020   TRIG 56 09/09/2020   CHOLHDL 2.6 09/09/2020     Medication review performed; medication list updated in electronic medical record.  Provider established cholesterol goals reviewed; Counseled on importance of regular laboratory monitoring as prescribed; Provided HLD educational materials; Reviewed role and benefits of statin for ASCVD risk reduction; Discussed strategies to manage statin-induced myalgias; Reviewed importance of limiting foods high in cholesterol; Reviewed exercise goals and target of 150 minutes per week;  Pain:  (Status: New goal.) Pain assessment performed Medications reviewed Reviewed provider established plan for pain management. 11-16-2020: the patient states that she does take Tylenol for pain relief and discomfort at times. She stays very busy and she states she  feels somewhat overwhelmed. She rates her pain today at a 4 on a scale of 0-10 Discussed importance of adherence to all scheduled medical appointments; Counseled on the importance of reporting any/all new or changed pain symptoms or management strategies to pain management provider; Advised patient to report to care team affect of pain on daily activities; Discussed use of relaxation techniques and/or diversional activities to assist with pain reduction (distraction, imagery, relaxation, massage, acupressure, TENS, heat, and cold application; Reviewed with patient prescribed pharmacological and nonpharmacological pain relief strategies; Advised patient to discuss call the provider for changes in level or intensity of pain and discomfort with provider;  Patient Goals/Self-Care Activities: Patient will self administer medications as prescribed Patient will attend all scheduled provider appointments Patient will call pharmacy for medication refills Patient will attend church or other social activities Patient will continue to perform ADL's independently Patient will continue to perform IADL's independently Patient will call provider office for new concerns or questions Patient will work with BSW to address care coordination needs and will continue to work with the clinical team to address health care and disease management related needs.         Consent to CCM Services: Ms. Arceneaux was given information about Chronic Care Management services including:  CCM service includes personalized support from designated clinical staff supervised by her physician, including individualized plan of care and coordination with other care providers 24/7 contact phone numbers for assistance for urgent and routine care needs. Service will only be billed when office clinical staff spend 20 minutes or more in a month to coordinate care. Only one practitioner may furnish and bill the service  in a calendar month. The  patient may stop CCM services at any time (effective at the end of the month) by phone call to the office staff. The patient will be responsible for cost sharing (co-pay) of up to 20% of the service fee (after annual deductible is met).  Patient agreed to services and verbal consent obtained.   Patient verbalizes understanding of instructions provided today and agrees to view in [place].   Telephone follow up appointment with care management team member scheduled for:11-30-2020 at 43 am  Wright, MSN, [facility] Mobile: [phone]  Mindfulness-Based Stress Reduction Mindfulness-based stress reduction (MBSR) is a program that helps people learn to practice mindfulness. Mindfulness is the practice of intentionally paying attention to the present moment. MBSR focuses on developing self-awareness, which allows you to respond to life stress without judgment or negative emotions. It can be learned and practiced through techniques such as education, breathing exercises, meditation, and yoga. MBSR includes several mindfulness techniques in one program. MBSR works best when you understand the treatment, are willing to try new things, and can commit to spending time practicing what you learn. MBSR training may include learning about: How your emotions, thoughts, and reactions affect your body. New ways to respond to things that cause negative thoughts to start (triggers). How to notice your thoughts and let go of them. Practicing awareness of everyday things that you normally do without thinking. The techniques and goals of different types of meditation. What are the benefits of MBSR? MBSR can have many benefits, which include helping you to: Develop self-awareness. This refers to knowing and understanding yourself. Learn skills and attitudes that help you to participate in your own health care. Learn new  ways to care for yourself. Be more accepting about how things are, and let things go. Be less judgmental and approach things with an open mind. Be patient with yourself and trust yourself more. MBSR has also been shown to: Reduce negative emotions, such as depression and anxiety. Improve memory and focus. Change how you sense and approach pain. Boost your body's ability to fight infections. Help you connect better with other people. Improve your sense of well-being. Follow these instructions at home:  Find a local in-person or online MBSR program. Set aside some time regularly for mindfulness practice. Find a mindfulness practice that works best for you. This may include one or more of the following: Meditation. Meditation involves focusing your mind on a certain thought or activity. Breathing awareness exercises. These help you to stay present by focusing on your breath. Body scan. For this practice, you lie down and pay attention to each part of your body from head to toe. You can identify tension and soreness and intentionally relax parts of your body. Yoga. Yoga involves stretching and breathing, and it can improve your ability to move and be flexible. It can also provide an experience of testing your body's limits, which can help you release stress. Mindful eating. This way of eating involves focusing on the taste, texture, color, and smell of each bite of food. Because this slows down eating and helps you feel full sooner, it can be an important part of a weight-loss plan. Find a podcast or recording that provides guidance for breathing awareness, body scan, or meditation exercises. You can listen to these any time when you have a free moment to rest without distractions. Follow your treatment plan as told by  your health care provider. This may include taking regular medicines and making changes to your diet or lifestyle as recommended. How to practice mindfulness To do a basic  awareness exercise: Find a comfortable place to sit. Pay attention to the present moment. Observe your thoughts, feelings, and surroundings just as they are. Avoid placing judgment on yourself, your feelings, or your surroundings. Make note of any judgment that comes up, and let it go. Your mind may wander, and that is okay. Make note of when your thoughts drift, and return your attention to the present moment. To do basic mindfulness meditation: Find a comfortable place to sit. This may include a stable chair or a firm floor cushion. Sit upright with your back straight. Let your arms fall next to your side with your hands resting on your legs. If sitting in a chair, rest your feet flat on the floor. If sitting on a cushion, cross your legs in front of you. Keep your head in a neutral position with your chin dropped slightly. Relax your jaw and rest the tip of your tongue on the roof of your mouth. Drop your gaze to the floor. You can close your eyes if you like. Breathe normally and pay attention to your breath. Feel the air moving in and out of your nose. Feel your belly expanding and relaxing with each breath. Your mind may wander, and that is okay. Make note of when your thoughts drift, and return your attention to your breath. Avoid placing judgment on yourself, your feelings, or your surroundings. Make note of any judgment or feelings that come up, let them go, and bring your attention back to your breath. When you are ready, lift your gaze or open your eyes. Pay attention to how your body feels after the meditation. Where to find more information You can find more information about MBSR from: Your health care provider. Community-based meditation centers or programs. Programs offered near you. Summary Mindfulness-based stress reduction (MBSR) is a program that teaches you how to intentionally pay attention to the present moment. It is used with other treatments to help you cope better with  daily stress, emotions, and pain. MBSR focuses on developing self-awareness, which allows you to respond to life stress without judgment or negative emotions. MBSR programs may involve learning different mindfulness practices, such as breathing exercises, meditation, yoga, body scan, or mindful eating. Find a mindfulness practice that works best for you, and set aside time for it on a regular basis. This information is not intended to replace advice given to you by your health care provider. Make sure you discuss any questions you have with your health care provider. Document Revised: 04/24/2020 Document Reviewed: 11/01/2019 Elsevier Patient Education  2022 [place]. Managing Depression, Adult Depression is a mental health condition that affects your thoughts, feelings, and actions. Being diagnosed with depression can bring you relief if you did not know why you have felt or behaved a certain way. It could also leave you feeling overwhelmed with uncertainty about your future. Preparing yourself to manage your symptoms can help you feel more positive about your future. How to manage lifestyle changes Managing stress Stress is your body's reaction to life changes and events, both good and bad. Stress can add to your feelings of depression. Learning to manage your stress can help lessen your feelings of depression. Try some of the following approaches to reducing your stress (stress reduction techniques): Listen to music that you enjoy and that inspires you. Try  using a meditation app or take a meditation class. Develop a practice that helps you connect with your spiritual self. Walk in nature, pray, or go to a place of worship. Do some deep breathing. To do this, inhale slowly through your nose. Pause at the top of your inhale for a few seconds and then exhale slowly, letting your muscles relax. Practice yoga to help relax and work your muscles. Choose a stress reduction technique that suits your  lifestyle and personality. These techniques take time and practice to develop. Set aside 5-15 minutes a day to do them. Therapists can offer training in these techniques. Other things you can do to manage stress include: Keeping a stress diary. Knowing your limits and saying no when you think something is too much. Paying attention to how you react to certain situations. You may not be able to control everything, but you can change your reaction. Adding humor to your life by watching funny films or TV shows. Making time for activities that you enjoy and that relax you.  Medicines Medicines, such as antidepressants, are often a part of treatment for depression. Talk with your pharmacist or health care provider about all the medicines, supplements, and herbal products that you take, their possible side effects, and what medicines and other products are safe to take together. Make sure to report any side effects you may have to your health care provider. Relationships Your health care provider may suggest family therapy, couples therapy, or individual therapy as part of your treatment. How to recognize changes Everyone responds differently to treatment for depression. As you recover from depression, you may start to: Have more interest in doing activities. Feel less hopeless. Have more energy. Overeat less often, or have a better appetite. Have better mental focus. It is important to recognize if your depression is not getting better or is getting worse. The symptoms you had in the beginning may return, such as: Tiredness (fatigue) or low energy. Eating too much or too little. Sleeping too much or too little. Feeling restless, agitated, or hopeless. Trouble focusing or making decisions. Unexplained physical complaints. Feeling irritable, angry, or aggressive. If you or your family members notice these symptoms coming back, let your health care provider know right away. Follow these  instructions at home: Activity  Try to get some form of exercise each day, such as walking, biking, swimming, or lifting weights. Practice stress reduction techniques. Engage your mind by taking a class or doing some volunteer work. Lifestyle Get the right amount and quality of sleep. Cut down on using caffeine, tobacco, alcohol, and other potentially harmful substances. Eat a healthy diet that includes plenty of vegetables, fruits, whole grains, low-fat dairy products, and lean protein. Do not eat a lot of foods that are high in solid fats, added sugars, or salt (sodium). General instructions Take over-the-counter and prescription medicines only as told by your health care provider. Keep all follow-up visits as told by your health care provider. This is important. Where to find support Talking to others Friends and family members can be sources of support and guidance. Talk to trusted friends or family members about your condition. Explain your symptoms to them, and let them know that you are working with a health care provider to treat your depression. Tell friends and family members how they also can be helpful. Finances Find appropriate mental health providers that fit with your financial situation. Talk with your health care provider about options to get reduced prices on your  medicines. Where to find more information You can find support in your area from: Anxiety and Depression Association of America (ADAA): www.adaa.org Mental Health America: www.mentalhealthamerica.net [employer] on Mental Illness: www.nami.org Contact a health care provider if: You stop taking your antidepressant medicines, and you have any of these symptoms: Nausea. Headache. Light-headedness. Chills and body aches. Not being able to sleep (insomnia). You or your friends and family think your depression is getting worse. Get help right away if: You have thoughts of hurting yourself or others. If  you ever feel like you may hurt yourself or others, or have thoughts about taking your own life, get help right away. Go to your nearest emergency department or: Call your local emergency services (911 in the [place] Call a suicide crisis helpline, such as the [facility] at 417-126-1640. This is open 24 hours a day in the [place] Text the Crisis Text Line at 515-879-6982 (in the [place].). Summary If you are diagnosed with depression, preparing yourself to manage your symptoms is a good way to feel positive about your future. Work with your health care provider on a management plan that includes stress reduction techniques, medicines (if applicable), therapy, and healthy lifestyle habits. Keep talking with your health care provider about how your treatment is working. If you have thoughts about taking your own life, call a suicide crisis helpline or text a crisis text line. This information is not intended to replace advice given to you by your health care provider. Make sure you discuss any questions you have with your health care provider. Document Revised: 12/26/2018 Document Reviewed: 12/26/2018 Elsevier Patient Education  2022 [place] Eating Plan DASH stands for Dietary Approaches to Stop Hypertension. The DASH eating plan is a healthy eating plan that has been shown to: Reduce high blood pressure (hypertension). Reduce your risk for type 2 diabetes, heart disease, and stroke. Help with weight loss. What are tips for following this plan? Reading food labels Check food labels for the amount of salt (sodium) per serving. Choose foods with less than 5 percent of the Daily Value of sodium. Generally, foods with less than 300 milligrams (mg) of sodium per serving fit into this eating plan. To find whole grains, look for the word "whole" as the first word in the ingredient list. Shopping Buy products labeled as "low-sodium" or "no salt added." Buy fresh foods. Avoid  canned foods and pre-made or frozen meals. Cooking Avoid adding salt when cooking. Use salt-free seasonings or herbs instead of table salt or sea salt. Check with your health care provider or pharmacist before using salt substitutes. Do not fry foods. Cook foods using healthy methods such as baking, boiling, grilling, roasting, and broiling instead. Cook with heart-healthy oils, such as olive, canola, avocado, soybean, or sunflower oil. Meal planning  Eat a balanced diet that includes: 4 or more servings of fruits and 4 or more servings of vegetables each day. Try to fill one-half of your plate with fruits and vegetables. 6-8 servings of whole grains each day. Less than 6 oz (170 g) of lean meat, poultry, or fish each day. A 3-oz (85-g) serving of meat is about the same size as a deck of cards. One egg equals 1 oz (28 g). 2-3 servings of low-fat dairy each day. One serving is 1 cup (237 mL). 1 serving of nuts, seeds, or beans 5 times each week. 2-3 servings of heart-healthy fats. Healthy fats called omega-3 fatty acids are found in foods such  as walnuts, flaxseeds, fortified milks, and eggs. These fats are also found in cold-water fish, such as sardines, salmon, and mackerel. Limit how much you eat of: Canned or prepackaged foods. Food that is high in trans fat, such as some fried foods. Food that is high in saturated fat, such as fatty meat. Desserts and other sweets, sugary drinks, and other foods with added sugar. Full-fat dairy products. Do not salt foods before eating. Do not eat more than 4 egg yolks a week. Try to eat at least 2 vegetarian meals a week. Eat more home-cooked food and less restaurant, buffet, and fast food. Lifestyle When eating at a restaurant, ask that your food be prepared with less salt or no salt, if possible. If you drink alcohol: Limit how much you use to: 0-1 drink a day for women who are not pregnant. 0-2 drinks a day for men. Be aware of how much alcohol  is in your drink. In the [place] one drink equals one 12 oz bottle of beer (355 mL), one 5 oz glass of wine (148 mL), or one 1 oz glass of hard liquor (44 mL). General information Avoid eating more than 2,300 mg of salt a day. If you have hypertension, you may need to reduce your sodium intake to 1,500 mg a day. Work with your health care provider to maintain a healthy body weight or to lose weight. Ask what an ideal weight is for you. Get at least 30 minutes of exercise that causes your heart to beat faster (aerobic exercise) most days of the week. Activities may include walking, swimming, or biking. Work with your health care provider or dietitian to adjust your eating plan to your individual calorie needs. What foods should I eat? Fruits All fresh, dried, or frozen fruit. Canned fruit in natural juice (without added sugar). Vegetables Fresh or frozen vegetables (raw, steamed, roasted, or grilled). Low-sodium or reduced-sodium tomato and vegetable juice. Low-sodium or reduced-sodium tomato sauce and tomato paste. Low-sodium or reduced-sodium canned vegetables. Grains Whole-grain or whole-wheat bread. Whole-grain or whole-wheat pasta. Brown rice. [place]. Bulgur. Whole-grain and low-sodium cereals. Pita bread. Low-fat, low-sodium crackers. Whole-wheat flour tortillas. Meats and other proteins Skinless chicken or [place]. Ground chicken or [place]. Pork with fat trimmed off. Fish and seafood. Egg whites. Dried beans, peas, or lentils. Unsalted nuts, nut butters, and seeds. Unsalted canned beans. Lean cuts of beef with fat trimmed off. Low-sodium, lean precooked or cured meat, such as sausages or meat loaves. Dairy Low-fat (1%) or fat-free (skim) milk. Reduced-fat, low-fat, or fat-free cheeses. Nonfat, low-sodium ricotta or cottage cheese. Low-fat or nonfat yogurt. Low-fat, low-sodium cheese. Fats and oils Soft margarine without trans fats. Vegetable oil. Reduced-fat, low-fat, or light  mayonnaise and salad dressings (reduced-sodium). Canola, safflower, olive, avocado, soybean, and sunflower oils. Avocado. Seasonings and condiments Herbs. Spices. Seasoning mixes without salt. Other foods Unsalted popcorn and pretzels. Fat-free sweets. The items listed above may not be a complete list of foods and beverages you can eat. Contact a dietitian for more information. What foods should I avoid? Fruits Canned fruit in a light or heavy syrup. Fried fruit. Fruit in cream or butter sauce. Vegetables Creamed or fried vegetables. Vegetables in a cheese sauce. Regular canned vegetables (not low-sodium or reduced-sodium). Regular canned tomato sauce and paste (not low-sodium or reduced-sodium). Regular tomato and vegetable juice (not low-sodium or reduced-sodium). Angie Fava. Olives. Grains Baked goods made with fat, such as croissants, muffins, or some breads. Dry pasta or rice meal packs. Meats  and other proteins Fatty cuts of meat. Ribs. Fried meat. Berniece Salines. Bologna, salami, and other precooked or cured meats, such as sausages or meat loaves. Fat from the back of a pig (fatback). Bratwurst. Salted nuts and seeds. Canned beans with added salt. Canned or smoked fish. Whole eggs or egg yolks. Chicken or [place] with skin. Dairy Whole or 2% milk, cream, and half-and-half. Whole or full-fat cream cheese. Whole-fat or sweetened yogurt. Full-fat cheese. Nondairy creamers. Whipped toppings. Processed cheese and cheese spreads. Fats and oils Butter. Stick margarine. Lard. Shortening. Ghee. Bacon fat. Tropical oils, such as coconut, palm kernel, or palm oil. Seasonings and condiments Onion salt, garlic salt, seasoned salt, table salt, and sea salt. Worcestershire sauce. Tartar sauce. Barbecue sauce. Teriyaki sauce. Soy sauce, including reduced-sodium. Steak sauce. Canned and packaged gravies. Fish sauce. Oyster sauce. Cocktail sauce. Store-bought horseradish. Ketchup. Mustard. Meat flavorings and  tenderizers. Bouillon cubes. Hot sauces. Pre-made or packaged marinades. Pre-made or packaged taco seasonings. Relishes. Regular salad dressings. Other foods Salted popcorn and pretzels. The items listed above may not be a complete list of foods and beverages you should avoid. Contact a dietitian for more information. Where to find more information National Heart, Lung, and Blood Institute: https://wilson-eaton.com/ American Heart Association: www.heart.org Academy of Nutrition and Dietetics: www.eatright.[place]: www.kidney.org Summary The DASH eating plan is a healthy eating plan that has been shown to reduce high blood pressure (hypertension). It may also reduce your risk for type 2 diabetes, heart disease, and stroke. When on the DASH eating plan, aim to eat more fresh fruits and vegetables, whole grains, lean proteins, low-fat dairy, and heart-healthy fats. With the DASH eating plan, you should limit salt (sodium) intake to 2,300 mg a day. If you have hypertension, you may need to reduce your sodium intake to 1,500 mg a day. Work with your health care provider or dietitian to adjust your eating plan to your individual calorie needs. This information is not intended to replace advice given to you by your health care provider. Make sure you discuss any questions you have with your health care provider. Document Revised: 01/18/2019 Document Reviewed: 01/18/2019 Elsevier Patient Education  2022 [employer].

## 2020-11-16 NOTE — Progress Notes (Signed)
Patient has been scheduled

## 2020-11-16 NOTE — Chronic Care Management (AMB) (Signed)
  Chronic Care Management   Note  11/16/2020 Name: Lucynda Rosano MRN: [id number] DOB: [birth year]  Marianita Botkin is a 70 y.o. year old female who is a primary care patient of Althea Charon, Netta Neat, DO. Evvie Behrmann is currently enrolled in care management services. An additional referral for Pharm D  was placed.   Follow up plan: Telephone appointment with care management team member scheduled for:11/27/2020  Penne Lash, RMA Care Guide, Embedded Care Coordination [facility]  [place], [place] [address] Direct Dial: [phone] [email or web address]

## 2020-11-27 ENCOUNTER — Ambulatory Visit: Payer: Medicare Other | Admitting: Pharmacist

## 2020-11-27 DIAGNOSIS — E1169 Type 2 diabetes mellitus with other specified complication: Secondary | ICD-10-CM

## 2020-11-27 DIAGNOSIS — F3342 Major depressive disorder, recurrent, in full remission: Secondary | ICD-10-CM

## 2020-11-27 DIAGNOSIS — I10 Essential (primary) hypertension: Secondary | ICD-10-CM

## 2020-11-27 DIAGNOSIS — M8949 Other hypertrophic osteoarthropathy, multiple sites: Secondary | ICD-10-CM

## 2020-11-27 DIAGNOSIS — F331 Major depressive disorder, recurrent, moderate: Secondary | ICD-10-CM | POA: Diagnosis not present

## 2020-11-27 DIAGNOSIS — E785 Hyperlipidemia, unspecified: Secondary | ICD-10-CM

## 2020-11-27 NOTE — Patient Instructions (Signed)
Visit Information   PATIENT GOALS:   Goals Addressed             This Visit's Progress    Pharmacy Goals       Please check your home blood pressure, keep a log of the results and bring this with you to your medical appointments.  Feel free to call me with any questions or concerns. I look forward to our next call!  Janet [name], PharmD, Para March, CPP Clinical Pharmacist [facility] [phone]        Consent to CCM Services: [name] was given information about Chronic Care Management services including:  CCM service includes personalized support from designated clinical staff supervised by her physician, including individualized plan of care and coordination with other care providers 24/7 contact phone numbers for assistance for urgent and routine care needs. Service will only be billed when office clinical staff spend 20 minutes or more in a month to coordinate care. Only one practitioner may furnish and bill the service in a calendar month. The patient may stop CCM services at any time (effective at the end of the month) by phone call to the office staff. The patient will be responsible for cost sharing (co-pay) of up to 20% of the service fee (after annual deductible is met).  Patient agreed to services and verbal consent obtained.   Patient verbalizes understanding of instructions provided today and agrees to view in [place].   Telephone follow up appointment with care management team member scheduled for:12/04/2020 at 9:15 AM   CLINICAL CARE PLAN:  Patient Care Plan: PharmD - Medication Adherence     Problem Identified: Disease Progression      Long-Range Goal: Disease Progression Prevented or Minimized   Start Date: 11/27/2020  Expected End Date: 02/25/2021  This Visit's Progress: On track  Priority: High  Note:   Current Barriers:  Unable to self administer medications as prescribed  Pharmacist Clinical Goal(s):  Over  the next 90 days, patient will achieve adherence to monitoring guidelines and medication adherence to achieve therapeutic efficacy through collaboration with PharmD and provider.    Interventions: 1:1 collaboration with Olin Hauser, DO regarding development and update of comprehensive plan of care as evidenced by provider attestation and co-signature Inter-disciplinary care team collaboration (see longitudinal plan of care) Comprehensive medication review performed; medication list updated in electronic medical record Identify patient currently taking losartan-HCTZ doses in evening. Recommend patient switch to taking losartan-HCTZ doses to morning administration to avoid nocturia Counsel patient on taking meloxicam with food and avoiding taking with any over NSAIDs, including OTC Patient verbalizes understanding and reports using meloxicam only as needed Confirms received 2nd dose of Shingrix vaccine at end of July 2022  Medication adherence: Reports frequently misses doses of her medications. Reports often forgets as busy at home caring for family and currently stays up overnight caring for 27 week old great grandchild Currently organizing medications by filling daily dose into plastic baggies, but admits to often forgetting to take medication or having difficulty remember whether has taken medication on a given day Discuss medication organization strategies including pill packaging and weekly pillbox Patient interested in trying weekly pillbox strategy today Reports already has a AM/PM pillbox at home and plans to fill: PM: aspirin, atorvastatin, duloxetine, gabapentin, pramipexole AM: gabapentin, pramipexole, losartan-HCTZ Counsel on how to use this tool Counsel on importance of keeping weekly pillbox as well as other medication out of reach of children Encourage patient to start  using twice daily phone alarms as additional adherence aid Patient will set these up on her phone  for 6 am and 9 pm Will collaborate with CCM Nurse Case Manager  Hypertension: Current treatment: losartan-HCTZ 100-25 mg daily Reports not taking metoprolol for past several months as understood PCP to have advised her to stop the medication in a previous visit Denies recently monitoring home BP, but does have a wrist BP monitor Encourage patient to consider obtaining home upper arm BP monitor for greater accuracy  From review of chart, note BP results from recent office visits: 9/14 from [facility]: BP 109/71, HR 74 8/19 from [facility]: BP 145/96, HR 69 7/29 from [facility]: BP 127/83, HR 73 Encourage patient to start monitoring home BP 1-2 times week and to keep a log of the results Will collaborate with PCP  Type 2 Diabetes: Controlled: current treatment: none  Hyperlipidemia: Controlled: current treatment: atorvastatin 40 mg daily  Patient Goals/Self-Care Activities Over the next 90 days, patient will:  - focus on medication adherence by:   Using weekly pillbox   Using medication reminder alarms on phone for 6 am and 9 pm  Follow Up Plan: Telephone follow up appointment with care management team member scheduled for: 12/04/2020 at 9:15 AM

## 2020-11-27 NOTE — Chronic Care Management (AMB) (Signed)
Chronic Care Management Pharmacy Note  11/27/2020 Name:  Janet [name] MRN:  [id number] DOB:  1950-05-11   Subjective: Janet [name] is an 70 y.o. year old female who is a primary patient of Smitty Cords, DO.  The CCM team was consulted for assistance with disease management and care coordination needs.    Engaged with patient by telephone for follow up visit in response to provider referral for pharmacy case management and/or care coordination services.   Consent to Services:  The patient was given information about Chronic Care Management services, agreed to services, and gave verbal consent prior to initiation of services.  Please see initial visit note for detailed documentation.   Patient Care Team: Smitty Cords, DO as PCP - General (Family Medicine) Marlowe Sax, RN as Registered Nurse (General Practice) [name] [name], RPH-CPP (Pharmacist)  Hospital visits: None in previous 6 months  Objective:  Lab Results  Component Value Date   CREATININE 0.71 09/09/2020   CREATININE 0.65 09/12/2019    Lab Results  Component Value Date   HGBA1C 5.7 (H) 09/09/2020   Last diabetic Eye exam:  Lab Results  Component Value Date/Time   HMDIABEYEEXA No Retinopathy 06/13/2019 12:00 AM    Last diabetic Foot exam: No results found for: HMDIABFOOTEX      Component Value Date/Time   CHOL 133 09/09/2020 0832   TRIG 56 09/09/2020 0832   HDL 52 09/09/2020 0832   CHOLHDL 2.6 09/09/2020 0832   LDLCALC 67 09/09/2020 0832    Hepatic Function Latest Ref Rng & Units 09/09/2020 09/12/2019  Total Protein 6.1 - 8.1 g/dL 6.8 6.6  AST 10 - 35 U/L 16 17  ALT 6 - 29 U/L 14 17  Total Bilirubin 0.2 - 1.2 mg/dL 0.9 1.2    Social History   Tobacco Use  Smoking Status Former   Packs/day: 1.00   Years: 25.00   Pack years: 25.00   Types: Cigarettes  Smokeless Tobacco Former   BP Readings from Last 3 Encounters:  09/16/20 117/66  04/14/20 139/79  03/03/20 136/85    Pulse Readings from Last 3 Encounters:  09/16/20 69  04/14/20 75  03/03/20 72   Wt Readings from Last 3 Encounters:  10/20/20 210 lb (95.3 kg)  09/16/20 208 lb 12.8 oz (94.7 kg)  04/14/20 206 lb 9.6 oz (93.7 kg)    Assessment: Review of patient past medical history, allergies, medications, health status, including review of consultants reports, laboratory and other test data, was performed as part of comprehensive evaluation and provision of chronic care management services.   SDOH:  (Social Determinants of Health) assessments and interventions performed: none   CCM Care Plan  No Known Allergies  Medications Reviewed Today     Reviewed by Manuela Neptune, RPH-CPP (Pharmacist) on 11/27/20 at 0941  Med List Status: <None>   Medication Order Taking? Sig Documenting Provider Last Dose Status Informant  aspirin 81 MG EC tablet [id number] Yes Take 81 mg by mouth daily. [provider] Taking Active   atorvastatin (LIPITOR) 40 MG tablet [id number] Yes Take 40 mg by mouth at bedtime. [provider] Taking Active   baclofen (LIORESAL) 10 MG tablet [id number] No Take by mouth.  Patient not taking: Reported on 11/27/2020   [provider] Not Taking Active   DULoxetine (CYMBALTA) 30 MG capsule [id number] Yes Take 1 capsule (30 mg total) by mouth daily. Smitty Cords, DO Taking Active   gabapentin (NEURONTIN) 300 MG capsule [id number]  Yes Take 300 mg by mouth 3 (three) times daily. [provider] Taking Active            Med Note (TATE, Charlsie Merles Nov 16, 2020 11:57 AM) Taking 2 times a day because forgets  losartan-hydrochlorothiazide (HYZAAR) 100-25 MG tablet [id number] Yes Take 1 tablet by mouth daily. Smitty Cords, DO Taking Active   meloxicam (MOBIC) 15 MG tablet [id number] Yes Take 15 mg by mouth daily as needed. [provider] Taking Active   Patient not taking:  Discontinued 11/27/20 0941 (Patient Preference)    pramipexole (MIRAPEX) 1 MG tablet [id number] Yes Take 1 mg by mouth 2 (two) times daily. [provider] Taking Active             Patient Active Problem List   Diagnosis Date Noted   Chronic left-sided low back pain with left-sided sciatica 04/14/2020   History of deep vein thrombosis 09/16/2019   Type 2 diabetes mellitus with other specified complication (HCC) 08/20/2019   Restless leg syndrome 08/20/2019   Major depression, recurrent, full remission (HCC) 08/20/2019   Other insomnia 08/20/2019   Symptomatic varicose veins, bilateral 08/20/2019   Edema of left lower extremity 08/20/2019   Primary osteoarthritis involving multiple joints 08/20/2019   Venous insufficiency of both lower extremities 08/20/2019   History of bariatric surgery 08/20/2019   Cardiac pacemaker 08/20/2019   Anxiety disorder 10/31/2018   Edema 10/31/2018   Heart murmur 10/31/2018   Osteoporosis 10/31/2018   Primary osteoarthritis of left knee 10/19/2018   History of pacemaker 09/12/2018   Shortness of breath 09/12/2018   Palpitations 06/20/2016   Postmenopausal bleeding 08/04/2015   Lymphedema 12/16/2014   Osteoarthritis 07/31/2014   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   Gastroesophageal reflux disease 07/31/2014   Mixed hyperlipidemia 07/31/2014   Morbid obesity (HCC) 07/31/2014   Obstructive sleep apnea syndrome 07/31/2014   Peripheral nerve disease 07/31/2014   Urinary incontinence 07/31/2014   History of adenomatous polyp of colon 02/28/2014    Immunization History  Administered Date(s) Administered   Fluad Quad(high Dose 65+) 03/03/2020   Influenza, High Dose Seasonal PF 01/09/2018, 11/21/2018, 11/21/2018   Influenza, Seasonal, Injecte, Preservative Fre 12/16/2014   Moderna Sars-Covid-2 Vaccination 03/27/2019, 04/26/2019   PFIZER(Purple Top)SARS-COV-2 Vaccination 01/09/2020, 07/08/2020   Pneumococcal Conjugate-13 01/07/2018   Pneumococcal Polysaccharide-23 09/19/2019   Zoster Recombinat  (Shingrix) 07/09/2020, 09/21/2020    Conditions to be addressed/monitored: HTN, HLD, T2DM  Care Plan : PharmD - Medication Adherence  Updates made by Manuela Neptune, RPH-CPP since 11/27/2020 12:00 AM     Problem: Disease Progression      Long-Range Goal: Disease Progression Prevented or Minimized   Start Date: 11/27/2020  Expected End Date: 02/25/2021  This Visit's Progress: On track  Priority: High  Note:   Current Barriers:  Unable to self administer medications as prescribed  Pharmacist Clinical Goal(s):  Over the next 90 days, patient will achieve adherence to monitoring guidelines and medication adherence to achieve therapeutic efficacy through collaboration with PharmD and provider.    Interventions: 1:1 collaboration with Smitty Cords, DO regarding development and update of comprehensive plan of care as evidenced by provider attestation and co-signature Inter-disciplinary care team collaboration (see longitudinal plan of care) Comprehensive medication review performed; medication list updated in electronic medical record Identify patient currently taking losartan-HCTZ doses in evening. Recommend patient switch to taking losartan-HCTZ doses to morning administration to avoid nocturia Counsel patient on taking meloxicam with food and avoiding taking with any  over NSAIDs, including OTC Patient verbalizes understanding and reports using meloxicam only as needed Confirms received 2nd dose of Shingrix vaccine at end of July 2022  Medication adherence: Reports frequently misses doses of her medications. Reports often forgets as busy at home caring for family and currently stays up overnight caring for 6 week old great grandchild Currently organizing medications by filling daily dose into plastic baggies, but admits to often forgetting to take medication or having difficulty remember whether has taken medication on a given day Discuss medication organization  strategies including pill packaging and weekly pillbox Patient interested in trying weekly pillbox strategy today Reports already has a AM/PM pillbox at home and plans to fill: AM: gabapentin, pramipexole, losartan-HCTZ PM: aspirin, atorvastatin, duloxetine, gabapentin, pramipexole Counsel on how to use this tool Counsel on importance of keeping weekly pillbox as well as other medication out of reach of children Encourage patient to start using twice daily phone alarms as additional adherence aid Patient will set these up on her phone for 6 am and 9 pm Will collaborate with CCM Nurse Case Manager  Hypertension: Current treatment: losartan-HCTZ 100-25 mg daily Reports not taking metoprolol for past several months as understood provider to have advised her to stop this medication in a previous visit Denies recently monitoring home BP, but does have a wrist BP monitor Encourage patient to consider obtaining home upper arm BP monitor for greater accuracy  From review of chart, note BP results from recent office visits: 9/14 from [facility]: BP 109/71, HR 74 8/19 from [facility]: BP 145/96, HR 69 7/29 from [facility]: BP 127/83, HR 73 Encourage patient to start monitoring home BP 1-2 times week and to keep a log of the results Will collaborate with PCP  Type 2 Diabetes: Controlled: current treatment: none  Hyperlipidemia: Controlled: current treatment: atorvastatin 40 mg daily  Patient Goals/Self-Care Activities Over the next 90 days, patient will:  - focus on medication adherence by:   Using weekly pillbox   Using medication reminder alarms on phone for 6 am and 9 pm  Follow Up Plan: Telephone follow up appointment with care management team member scheduled for: 12/04/2020 at 9:15 AM      Patient's preferred pharmacy is:  [id number] [place], [place] - [address] AT [facility] [address]  [place] [address] [place] [address] Phone: [phone] Fax: [phone]  Follow Up:  Patient agrees to Care Plan and Follow-up.  Estelle Grumbles, PharmD, Patsy [place], CPP Clinical Pharmacist [facility] [phone]

## 2020-11-30 ENCOUNTER — Telehealth: Payer: Medicare Other | Admitting: General Practice

## 2020-11-30 ENCOUNTER — Ambulatory Visit (INDEPENDENT_AMBULATORY_CARE_PROVIDER_SITE_OTHER): Payer: Medicare Other

## 2020-11-30 DIAGNOSIS — I10 Essential (primary) hypertension: Secondary | ICD-10-CM

## 2020-11-30 DIAGNOSIS — F331 Major depressive disorder, recurrent, moderate: Secondary | ICD-10-CM

## 2020-11-30 DIAGNOSIS — E1169 Type 2 diabetes mellitus with other specified complication: Secondary | ICD-10-CM

## 2020-11-30 DIAGNOSIS — G8929 Other chronic pain: Secondary | ICD-10-CM

## 2020-11-30 DIAGNOSIS — E785 Hyperlipidemia, unspecified: Secondary | ICD-10-CM

## 2020-11-30 DIAGNOSIS — F411 Generalized anxiety disorder: Secondary | ICD-10-CM

## 2020-11-30 DIAGNOSIS — M797 Fibromyalgia: Secondary | ICD-10-CM

## 2020-11-30 DIAGNOSIS — F3342 Major depressive disorder, recurrent, in full remission: Secondary | ICD-10-CM

## 2020-11-30 DIAGNOSIS — G2581 Restless legs syndrome: Secondary | ICD-10-CM

## 2020-11-30 DIAGNOSIS — M159 Polyosteoarthritis, unspecified: Secondary | ICD-10-CM

## 2020-11-30 NOTE — Chronic Care Management (AMB) (Signed)
Chronic Care Management   CCM RN Visit Note  11/30/2020 Name: Janet [name] MRN: [id number] DOB: 09-Aug-1950  Subjective: Janet [name] is a 70 y.o. year old female who is a primary care patient of Smitty Cords, DO. The care management team was consulted for assistance with disease management and care coordination needs.    Engaged with patient by telephone for follow up visit in response to provider referral for case management and/or care coordination services.   Consent to Services:  The patient was given information about Chronic Care Management services, agreed to services, and gave verbal consent prior to initiation of services.  Please see initial visit note for detailed documentation.   Patient agreed to services and verbal consent obtained.   Assessment: Review of patient past medical history, allergies, medications, health status, including review of consultants reports, laboratory and other test data, was performed as part of comprehensive evaluation and provision of chronic care management services.   SDOH (Social Determinants of Health) assessments and interventions performed:    CCM Care Plan  No Known Allergies  Outpatient Encounter Medications as of 11/30/2020  Medication Sig Note   aspirin 81 MG EC tablet Take 81 mg by mouth daily.    atorvastatin (LIPITOR) 40 MG tablet Take 40 mg by mouth at bedtime.    baclofen (LIORESAL) 10 MG tablet Take by mouth. (Patient not taking: Reported on 11/27/2020)    DULoxetine (CYMBALTA) 30 MG capsule Take 1 capsule (30 mg total) by mouth daily.    gabapentin (NEURONTIN) 300 MG capsule Take 300 mg by mouth 3 (three) times daily. 11/27/2020: Taking twice daily   losartan-hydrochlorothiazide (HYZAAR) 100-25 MG tablet Take 1 tablet by mouth daily.    meloxicam (MOBIC) 15 MG tablet Take 15 mg by mouth daily as needed.    pramipexole (MIRAPEX) 1 MG tablet Take 1 mg by mouth 2 (two) times daily.    No facility-administered encounter  medications on file as of 11/30/2020.    Patient Active Problem List   Diagnosis Date Noted   Chronic left-sided low back pain with left-sided sciatica 04/14/2020   History of deep vein thrombosis 09/16/2019   Type 2 diabetes mellitus with other specified complication (HCC) 08/20/2019   Restless leg syndrome 08/20/2019   Major depression, recurrent, full remission (HCC) 08/20/2019   Other insomnia 08/20/2019   Symptomatic varicose veins, bilateral 08/20/2019   Edema of left lower extremity 08/20/2019   Primary osteoarthritis involving multiple joints 08/20/2019   Venous insufficiency of both lower extremities 08/20/2019   History of bariatric surgery 08/20/2019   Cardiac pacemaker 08/20/2019   Anxiety disorder 10/31/2018   Edema 10/31/2018   Heart murmur 10/31/2018   Osteoporosis 10/31/2018   Primary osteoarthritis of left knee 10/19/2018   History of pacemaker 09/12/2018   Shortness of breath 09/12/2018   Palpitations 06/20/2016   Postmenopausal bleeding 08/04/2015   Lymphedema 12/16/2014   Osteoarthritis 07/31/2014   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   Gastroesophageal reflux disease 07/31/2014   Mixed hyperlipidemia 07/31/2014   Morbid obesity (HCC) 07/31/2014   Obstructive sleep apnea syndrome 07/31/2014   Peripheral nerve disease 07/31/2014   Urinary incontinence 07/31/2014   History of adenomatous polyp of colon 02/28/2014    Conditions to be addressed/monitored:HTN, HLD, DMII, Anxiety, Depression, and Chronic pain with RLS, OA  Care Plan : RNCM: Adult plan of care for  Chronic Disease Management and Care Coordination Needs  Updates made by Marlowe Sax, RN since 11/30/2020 12:00 AM     Problem: RNCM:  Adult plan of care for Chronic Disease Management and Care Coordination Needs   Priority: High  Onset Date: 11/16/2020     Long-Range Goal: RNCM: Adult Plan of Care for Chronic Disease Management and Care Coordination Needs   Start Date: 11/16/2020  Expected End Date:  11/16/2021  This Visit's Progress: On track  Recent Progress: Not on track  Priority: High  Note:   Current Barriers:  Knowledge Deficits related to plan of care for management of HLD, DMII, Anxiety with family circumstances and not caring for self, Depression: depressed mood, and chronic pain (RLS, OA, Chronic back pain),l and falls and safety concerns insomnia, fatigue, anxiety, disturbed sleep,, Caregiver Stress, Grief, Insomnia/Sleep Difficulties, and Stress at family circumstances, and grief process over the loss of her oldest daughter to COVID in October 2021  Care Coordination needs related to Limited social support, Level of care concerns, Mental Health Concerns , and Family and relationship dysfunction  Chronic Disease Management support and education needs related to HLD, DMII, Anxiety with Social Anxiety,, Depression: depressed mood, falls prevention and safety and chronic pain  insomnia, fatigue, anxiety,, Caregiver Stress, Grief, Mood Instability, and Stress at family situations and circumstances, and grief at the loss of her oldest daughter last year in October of 2021 Lacks caregiver support.  Non-adherence to prescribed medication regimen  RNCM Clinical Goal(s):  Patient will verbalize understanding of plan for management of HLD, DMII, Anxiety, Depression,  falls prevention, and Chronic pain  verbalize basic understanding of HLD, DMII, Anxiety, Depression, falls prevention, and Chronic pain  disease process and self health management plan  take all medications exactly as prescribed and will call provider for medication related questions demonstrate understanding of rationale for each prescribed medication patient desires to work with pharm D for effective management of chronic conditions and how to take her medications as prescribed demonstrate improved and ongoing adherence to prescribed treatment plan for HLD, DMII, Anxiety, Depression, falls prevention, and chronic pain  as  evidenced by adherence to ADA/ carb modified diet adherence to prescribed medication regimen contacting provider for new or worsened symptoms or questions and working with the CCM team for effective management of chronic diseases  demonstrate improved and ongoing health management independence by remaining safe in home environment and effective management of Chronic conditions  continue to work with Medical illustrator to address care management and care coordination needs related to HLD, DMII, Anxiety, Depression, and chronic pain and fall prevention   work with pharmacist to address medication concerns as the patient admits she does not take her medications as prescribed and struggles with managing her medications related to HLD, DMII, Anxiety, Depression, and chronic pain and falls prevention and safety through collaboration with RN Care manager, provider, and care team.   Interventions: 1:1 collaboration with primary care provider regarding development and update of comprehensive plan of care as evidenced by provider attestation and co-signature Inter-disciplinary care team collaboration (see longitudinal plan of care) Evaluation of current treatment plan related to  self management and patient's adherence to plan as established by provider   SDOH Barriers (Status: Goal on track: YES.)  Patient interviewed and SDOH assessment performed        SDOH Interventions    Flowsheet Row Most Recent Value  SDOH Interventions   Food Insecurity Interventions Intervention Not Indicated  Intimate Partner Violence Interventions Intervention Not Indicated  Physical Activity Interventions Other (Comments)  [no structured activities]  Social Connections Interventions Other (Comment)  [good support system, is moving soon]  Transportation Interventions Intervention Not Indicated     Patient interviewed and appropriate assessments performed Provided patient with information about CCM team and working with the  patient to manage chronic conditions and effectively manage health and well being. The patient wants to be proactive in her care and is willing to work with the team Discussed plans with patient for ongoing care management follow up and provided patient with direct contact information for care management team Advised patient to call the office for changes in condition or SDOH needs. The patient states that she is hoping by working with the CCM team she can remember best how to take her medications and take care of herself beter.  Collaborated with RN Case Manager re: ongoing support and education for management of chronic conditions and health and well being goals.     Diabetes:  (Status: Goal on track: YES.) Lab Results  Component Value Date   HGBA1C 5.7 (H) 09/09/2020  Assessed patient's understanding of A1c goal: <7% Provided education to patient about basic DM disease process; Reviewed prescribed diet with patient 11-16-2020: Review of heart healthy/ADA diet. The patient eats small meals and sometimes is not eating what she should. Sometimes eats a bedtime snack sometimes not. The patient encouraged to follow a heart healthy/ADA diet. Will send information by my chart system for review and help with meal choices and options. 11-30-2020: Is eating well and denies any issues with compliance with heart healthy/ADA diet ; Counseled on importance of regular laboratory monitoring as prescribed;        Discussed plans with patient for ongoing care management follow up and provided patient with direct contact information for care management team;      Provided patient with written educational materials related to hypo and hyperglycemia and importance of correct treatment. 11-16-2020: Reviewed with the patient the sx and sx of hypo and hyperglycemia. The patient does not check her blood sugars as recommended by the provider. She does have a meter but does not take medications for DM and mainly controls DM  with diet. Education and support given. The patient states that she will start taking blood sugars if she feels different. Denies lows; however states that sometimes at night she experiences "sweating". Eduction and support given. Will continue to monitor. 11-30-2020: The patient denies any lows at this time. States her blood sugar this am was 109. States she is doing better with management of her overall health and well being. Will continue to monitor.        Review of patient status, including review of consultants reports, relevant laboratory and other test results, and medications completed;        Falls:  (Status: Goal on track: YES.) Provided written and verbal education re: potential causes of falls and Fall prevention strategies Reviewed medications and discussed potential side effects of medications such as dizziness and frequent urination. 9-19-202: The patient reviewed with the RNCM medications that she takes. Is following up with the pharm D as she forgets to take medications. Will monitor for medications that may cause her to have issues with fall hazards.  Advised patient of importance of notifying provider of falls Assessed for signs and symptoms of orthostatic hypotension. 9-19-202: Denies any issues with orthostatic hypotension. Takes blood pressures sometimes at home. Advised the patient to take blood pressures and record. 11-30-2020: Denies any low blood pressures at this time. Assessed for falls since last encounter. 11-16-2020: The patient states that she has a fall about once a month.  Last fall earlier this month. States no injuries. The patient states that she uses a cane when ambulating but sometimes falls because she doesn't use her cane. She is always cooking and cleaning and doing for others. She states its easy for her not to take good care of herself. 11-30-2020: The patient denies any new falls. Review of safety concerns and being cautious to prevent falls and injuries.  Assessed  patients knowledge of fall risk prevention secondary to previously provided education Advised patient to discuss new falls, concerns, or questions  with provider  Depression and anxiety   (Status: Goal on track: YES.) Evaluation of current treatment plan related to Anxiety and Depression, Limited social support, Level of care concerns, Mental Health Concerns , and Family and relationship dysfunction self-management and patient's adherence to plan as established by provider. Discussed plans with patient for ongoing care management follow up and provided patient with direct contact information for care management team Advised patient to call the office for changes in mood, axiety, depression or other mental health concerns; Provided education to patient re: managing stressors in life and working with the CCM team to effectively manage her depression and anxiety ; Reviewed medications with patient and discussed compliance. The patient sometimes forgets to take her medications. Is open to ideas to help her with effective management of medications. 11-30-2020: Is doing better with management of her medications. Working with the pharm D and also setting alarms on her phone for 2 times a day to help her remember to take her medications. ; Collaborated with CCM team  regarding complex health needs and the patient wanting help in management of her chronic conditions. The patient states that her and her youngest daughter are going to be moving in about a month. She feels this will help her a whole lot as she is really stressed over cooking, cleaning and helping care for so many family members. She often feels overwhelmed and knows she is not taking care of herself. 11-30-2020: The patient is excited about moving in with her daughter. Her daughter closes on her new house on Friday and the patient will be moving in with her. She states she will still come and help some at her current home but it will be a lot less  stressful on her. She is optimistic. Review of self care; Provided patient with depression and anxiety  educational materials related to relieving stressors in her life; Social Work referral for help with ongoing support and stress relief measures for effective management of mental health well beig ; Pharmacy referral for help with management of medications and help with system that will help her ; Discussed plans with patient for ongoing care management follow up and provided patient with direct contact information for care management team; Advised patient to discuss new changes and stressors  with provider; Screening for signs and symptoms of depression related to chronic disease state;  Assessed social determinant of health barriers;   Hyperlipidemia:  (Status: Goal on track: YES.) Lab Results  Component Value Date   CHOL 133 09/09/2020   HDL 52 09/09/2020   LDLCALC 67 09/09/2020   TRIG 56 09/09/2020   CHOLHDL 2.6 09/09/2020     Medication review performed; medication list updated in electronic medical record.  Provider established cholesterol goals reviewed; Counseled on importance of regular laboratory monitoring as prescribed; Provided HLD educational materials; Reviewed role and benefits of statin for ASCVD risk reduction; Discussed strategies to manage statin-induced myalgias; Reviewed importance of limiting foods  high in cholesterol; Reviewed exercise goals and target of 150 minutes per week;  Pain:  (Status: Goal on track: YES.) Pain assessment performed Medications reviewed Reviewed provider established plan for pain management. 11-16-2020: the patient states that she does take Tylenol for pain relief and discomfort at times. She stays very busy and she states she feels somewhat overwhelmed. She rates her pain today at a 4 on a scale of 0-10. 11-30-2020: Is effectively managing pain at this time. Denies any acute findings. Will continue to monitor.  Discussed importance of  adherence to all scheduled medical appointments. 11-30-2020: Reminded the patient of pharm D appointment on 12-04-2020.  Counseled on the importance of reporting any/all new or changed pain symptoms or management strategies to pain management provider; Advised patient to report to care team affect of pain on daily activities; Discussed use of relaxation techniques and/or diversional activities to assist with pain reduction (distraction, imagery, relaxation, massage, acupressure, TENS, heat, and cold application; Reviewed with patient prescribed pharmacological and nonpharmacological pain relief strategies; Advised patient to discuss call the provider for changes in level or intensity of pain and discomfort with provider;  Patient Goals/Self-Care Activities: Patient will self administer medications as prescribed Patient will attend all scheduled provider appointments Patient will call pharmacy for medication refills Patient will attend church or other social activities Patient will continue to perform ADL's independently Patient will continue to perform IADL's independently Patient will call provider office for new concerns or questions Patient will work with BSW to address care coordination needs and will continue to work with the clinical team to address health care and disease management related needs.         Plan:Telephone follow up appointment with care management team member scheduled for:  01-25-2021 at 1030 am  Alto Denver RN, MSN, CCM Community Care Coordinator [facility]  [facility] [place] Mobile: [phone]

## 2020-11-30 NOTE — Patient Instructions (Signed)
Visit Information  PATIENT GOALS:  Goals Addressed             This Visit's Progress    RNCM: Cope with Pain-Osteoarthritis       Follow Up Date 01/25/2021    - learn how to meditate - learn relaxation techniques - practice acceptance of chronic pain - spend time with positive people - tell myself I can (not I can't) - think of new ways to do favorite things - use distraction techniques - use relaxation during pain    Why is this important?   Living with joint pain and enjoying your life may be hard.  Feelings like depression or anger can make your pain worse.  Learning ways to cope may help you find some relief from the pain.    Notes: 11-16-2020: The patient states she takes medications and Tylenol if needed for pain relief. Rates her pain level at a 4 today on a scale of 0-10. States that she is having an okay day today. Denies any acute distress. Will continue to monitor for changes in condition. 11-30-2020: The patient is doing better today. States she will be happy when she moves. She thinks this will help her over all health and well being.      RNCM: Manage My Emotions       Timeframe:  Long-Range Goal Priority:  High Start Date:       11-16-2020                      Expected End Date:                      11-16-2021 Follow Up Date 01-25-2021    - call and visit an old friend - join a support group - laugh; watch a funny movie or comedian - learn and use visualization or guided imagery - perform a random act of kindness - practice relaxation or meditation daily - talk about feelings with a friend, family or spiritual advisor - practice positive thinking and self-talk    Why is this important?   When you are stressed, down or upset, your body reacts too.  For example, your blood pressure may get higher; you may have a headache or stomachache.  When your emotions get the best of you, your body's ability to fight off cold and flu gets weak.  These steps will help  you manage your emotions.     Notes: 11-16-2020: The patient states her depression goes up and down. She feels if she managed her medications better she would do better at managing her chronic conditions. She states that she is going to be moving soon and feels this will also help. She feels overwhelmed at this time because she is cooking and cleaning and there are several people in the household. Will continue to monitor. 11-30-2020: The patient is feeling better. The patient states she is excited about moving in with her youngest daughter. She closes on her new house this week. She states she will still come and help some but will not have to unless she wants to do so.Denies any acute distress today.      RNCM: Manage My Medicine       Timeframe:  Long-Range Goal Priority:  High Start Date:      11-16-2020                       Expected End Date:  11-16-2021                 Follow Up Date 01-25-2021    - call for medicine refill 2 or 3 days before it runs out - call if I am sick and can't take my medicine - keep a list of all the medicines I take; vitamins and herbals too - learn to read medicine labels - use a pillbox to sort medicine - use an alarm clock or phone to remind me to take my medicine    Why is this important?   These steps will help you keep on track with your medicines.   Notes: 11-16-2020: The patient admits that she forgets to take her medications at times. States this is the biggest issue that she has. States that she feels she would do a lot better with her health and well being if she were able to manage her medications better and take them as prescribed. The patient is receptive to talking to the pharm D for pill packaging system, reconciliation, and education. Will continue to work with [facility] as well. 11-30-2020: The patient is working with the pharm D for medications help and management. The patient is using alarms on her phone and has adjusted the times to better help her  remember when to take her medications. She also has started taking the Losartan/HCTZ in the morning instead of at night. She feels like she is doing better with managing her medications at this time.      RNCM: Prevent Falls and Injury       Follow Up Date 01-25-2021   - add more outdoor lighting - always use handrails on the stairs - always wear low-heeled or flat shoes or slippers with nonskid soles - call the doctor if I am feeling too drowsy - install bathroom grab bars - join an exercise group in my community - keep a flashlight by the bed - keep my cell phone with me always - learn how to get back up if I fall - make an emergency alert plan in case I fall - pick up clutter from the floors - use a nonslip pad with throw rugs, or remove them completely - use a cane or walker - use a nightlight in the bathroom    Why is this important?   Most falls happen when it is hard for you to walk safely. Your balance may be off because of an illness. You may have pain in your knees, hip or other joints.  You may be overly tired or taking medicines that make you sleepy. You may not be able to see or hear clearly.  Falls can lead to broken bones, bruises or other injuries.  There are things you can do to help prevent falling.     Notes: 11-16-2020: The patient uses a cane when ambulating. States that she falls about one time a month. Is trying to be more careful and not have falls. Review of risk of head trauma and fractures for frequent falls. Reminded the patient to use DME when ambulating. Will continue to monitor for changes. 11-30-2020: The patient denies any new falls at this time. The patient states she is being careful.         Patient verbalizes understanding of instructions provided today and agrees to view in MyChart.   Telephone follow up appointment with care management team member scheduled for: 01-25-2021 at 1030 am  Alto Denver RN, MSN, [facility]  [facility] Mobile: [phone]

## 2020-12-01 DIAGNOSIS — M48061 Spinal stenosis, lumbar region without neurogenic claudication: Secondary | ICD-10-CM | POA: Diagnosis not present

## 2020-12-01 DIAGNOSIS — M79641 Pain in right hand: Secondary | ICD-10-CM | POA: Diagnosis not present

## 2020-12-01 DIAGNOSIS — G2581 Restless legs syndrome: Secondary | ICD-10-CM | POA: Diagnosis not present

## 2020-12-01 DIAGNOSIS — M79642 Pain in left hand: Secondary | ICD-10-CM | POA: Diagnosis not present

## 2020-12-01 DIAGNOSIS — R29898 Other symptoms and signs involving the musculoskeletal system: Secondary | ICD-10-CM | POA: Diagnosis not present

## 2020-12-02 DIAGNOSIS — M48062 Spinal stenosis, lumbar region with neurogenic claudication: Secondary | ICD-10-CM | POA: Diagnosis not present

## 2020-12-02 DIAGNOSIS — M5136 Other intervertebral disc degeneration, lumbar region: Secondary | ICD-10-CM | POA: Diagnosis not present

## 2020-12-02 DIAGNOSIS — M5416 Radiculopathy, lumbar region: Secondary | ICD-10-CM | POA: Diagnosis not present

## 2020-12-04 ENCOUNTER — Ambulatory Visit: Payer: Medicare Other | Admitting: Pharmacist

## 2020-12-04 DIAGNOSIS — I10 Essential (primary) hypertension: Secondary | ICD-10-CM

## 2020-12-04 DIAGNOSIS — E1169 Type 2 diabetes mellitus with other specified complication: Secondary | ICD-10-CM

## 2020-12-04 NOTE — Patient Instructions (Signed)
Visit Information  PATIENT GOALS:  Goals Addressed             This Visit's Progress    Pharmacy Goals       Please check your home blood pressure, keep a log of the results and bring this with you to your medical appointments.  Feel free to call me with any questions or concerns. I look forward to our next call!   Estelle Grumbles, PharmD, [place], CPP Clinical Pharmacist [facility] [phone]        The patient verbalized understanding of instructions, educational materials, and care plan provided today and declined offer to receive copy of patient instructions, educational materials, and care plan.   Telephone follow up appointment with care management team member scheduled for: 12/18/2020 at 9:15 AM

## 2020-12-04 NOTE — Chronic Care Management (AMB) (Signed)
Chronic Care Management Pharmacy Note  12/04/2020 Name:  Janet [name] MRN:  [id number] DOB:  Aug 19, 1950   Subjective: Janet [name] is an 70 y.o. year old female who is a primary patient of Smitty Cords, DO.  The CCM team was consulted for assistance with disease management and care coordination needs.    Engaged with patient by telephone for follow up visit in response to provider referral for pharmacy case management and/or care coordination services.   Consent to Services:  The patient was given information about Chronic Care Management services, agreed to services, and gave verbal consent prior to initiation of services.  Please see initial visit note for detailed documentation.   Patient Care Team: Smitty Cords, DO as PCP - General (Family Medicine) Marlowe Sax, RN as Registered Nurse (General Practice) [name], [name], RPH-CPP (Pharmacist)   Recent consult visits: Office Visit with [facility] Neurology on 10/4 Office Visit with [facility] Physical Medicine and Rehabilitation on 10/5  Hospital visits: None in previous 6 months  Objective:  Lab Results  Component Value Date   CREATININE 0.71 09/09/2020   CREATININE 0.65 09/12/2019    Lab Results  Component Value Date   HGBA1C 5.7 (H) 09/09/2020   Last diabetic Eye exam:  Lab Results  Component Value Date/Time   HMDIABEYEEXA No Retinopathy 06/13/2019 12:00 AM    Last diabetic Foot exam: No results found for: HMDIABFOOTEX      Component Value Date/Time   CHOL 133 09/09/2020 0832   TRIG 56 09/09/2020 0832   HDL 52 09/09/2020 0832   CHOLHDL 2.6 09/09/2020 0832   LDLCALC 67 09/09/2020 0832    Hepatic Function Latest Ref Rng & Units 09/09/2020 09/12/2019  Total Protein 6.1 - 8.1 g/dL 6.8 6.6  AST 10 - 35 U/L 16 17  ALT 6 - 29 U/L 14 17  Total Bilirubin 0.2 - 1.2 mg/dL 0.9 1.2     Social History   Tobacco Use  Smoking Status Former   Packs/day: 1.00   Years: 25.00    Pack years: 25.00   Types: Cigarettes  Smokeless Tobacco Former   BP Readings from Last 3 Encounters:  09/16/20 117/66  04/14/20 139/79  03/03/20 136/85   Pulse Readings from Last 3 Encounters:  09/16/20 69  04/14/20 75  03/03/20 72   Wt Readings from Last 3 Encounters:  10/20/20 210 lb (95.3 kg)  09/16/20 208 lb 12.8 oz (94.7 kg)  04/14/20 206 lb 9.6 oz (93.7 kg)    Assessment: Review of patient past medical history, allergies, medications, health status, including review of consultants reports, laboratory and other test data, was performed as part of comprehensive evaluation and provision of chronic care management services.   SDOH:  (Social Determinants of Health) assessments and interventions performed:    CCM Care Plan  No Known Allergies  Medications Reviewed Today     Reviewed by Manuela Neptune, RPH-CPP (Pharmacist) on 12/04/20 at [phone]  Med List Status: <None>   Medication Order Taking? Sig Documenting Provider Last Dose Status Informant  aspirin 81 MG EC tablet [id number]  Take 81 mg by mouth daily. [provider]  Active   atorvastatin (LIPITOR) 40 MG tablet [id number]  Take 40 mg by mouth at bedtime. [provider]  Active   baclofen (LIORESAL) 10 MG tablet [id number] No Take by mouth.  Patient not taking: No sig reported   [provider] Not Taking Active   DULoxetine (CYMBALTA) 30 MG capsule  [id number]  Take 1 capsule (30 mg total) by mouth daily. Karamalegos, Netta Neat, DO  Active   gabapentin (NEURONTIN) 300 MG capsule [id number] Yes Take 300 mg by mouth 3 (three) times daily. [provider] Taking Active            Med Note [name], [facility] A   Fri Dec 04, 2020  9:37 AM) Taking 1 capsule (300 mg) in the morning and 2 capsules  (600 mg) at night as directed by Neurologist.  losartan-hydrochlorothiazide (HYZAAR) 100-25 MG tablet [id number] Yes Take 1 tablet by mouth daily. Smitty Cords, DO Taking Active    meloxicam (MOBIC) 15 MG tablet [id number]  Take 15 mg by mouth daily as needed. [provider]  Active   pramipexole (MIRAPEX) 1 MG tablet [id number]  Take 1 mg by mouth 2 (two) times daily. [provider]  Active             Patient Active Problem List   Diagnosis Date Noted   Chronic left-sided low back pain with left-sided sciatica 04/14/2020   History of deep vein thrombosis 09/16/2019   Type 2 diabetes mellitus with other specified complication (HCC) 08/20/2019   Restless leg syndrome 08/20/2019   Major depression, recurrent, full remission (HCC) 08/20/2019   Other insomnia 08/20/2019   Symptomatic varicose veins, bilateral 08/20/2019   Edema of left lower extremity 08/20/2019   Primary osteoarthritis involving multiple joints 08/20/2019   Venous insufficiency of both lower extremities 08/20/2019   History of bariatric surgery 08/20/2019   Cardiac pacemaker 08/20/2019   Anxiety disorder 10/31/2018   Edema 10/31/2018   Heart murmur 10/31/2018   Osteoporosis 10/31/2018   Primary osteoarthritis of left knee 10/19/2018   History of pacemaker 09/12/2018   Shortness of breath 09/12/2018   Palpitations 06/20/2016   Postmenopausal bleeding 08/04/2015   Lymphedema 12/16/2014   Osteoarthritis 07/31/2014   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   Gastroesophageal reflux disease 07/31/2014   Mixed hyperlipidemia 07/31/2014   Morbid obesity (HCC) 07/31/2014   Obstructive sleep apnea syndrome 07/31/2014   Peripheral nerve disease 07/31/2014   Urinary incontinence 07/31/2014   History of adenomatous polyp of colon 02/28/2014    Immunization History  Administered Date(s) Administered   Fluad Quad(high Dose 65+) 03/03/2020   Influenza, High Dose Seasonal PF 01/09/2018, 11/21/2018, 11/21/2018   Influenza, Seasonal, Injecte, Preservative Fre 12/16/2014   Moderna Sars-Covid-2 Vaccination 03/27/2019, 04/26/2019   PFIZER(Purple Top)SARS-COV-2 Vaccination 01/09/2020, 07/08/2020    Pneumococcal Conjugate-13 01/07/2018   Pneumococcal Polysaccharide-23 09/19/2019   Zoster Recombinat (Shingrix) 07/09/2020, 09/21/2020    Conditions to be addressed/monitored: HTN, HLD, T2DM  Care Plan : PharmD - Medication Adherence  Updates made by Manuela Neptune, RPH-CPP since 12/04/2020 12:00 AM     Problem: Disease Progression      Long-Range Goal: Disease Progression Prevented or Minimized   Start Date: 11/27/2020  Expected End Date: 02/25/2021  This Visit's Progress: On track  Recent Progress: On track  Priority: High  Note:   Current Barriers:  Unable to self administer medications as prescribed  Pharmacist Clinical Goal(s):  Over the next 90 days, patient will achieve adherence to monitoring guidelines and medication adherence to achieve therapeutic efficacy through collaboration with PharmD and provider.    Interventions: 1:1 collaboration with Smitty Cords, DO regarding development and update of comprehensive plan of care as evidenced by provider attestation and co-signature Inter-disciplinary care team collaboration (see longitudinal plan of care) Perform chart review: Office Visit with [facility] Neurology on 10/4.  Provider advised: Continue mirapex 1 mg twice daily Change gabapentin to 300 mg in the morning and 600 mg at night. After 2 weeks, can increase to 300 mg in the morning and 900 mg at night. Office Visit with [facility] Physical Medicine and Rehabilitation on 10/5 Today reports increased evening dose of gabapentin as directed by Neurologist starting yesterday. Denies side effects such as dizziness/sedation with dose increase, but reports will monitor, particularly as gets up overnight  Medication adherence: Reports medication adherence significantly improved with using weekly pillbox and twice daily phone reminder alarms Reports started using weekly pillbox on 10/2 and will plan to refill each weekend Reports set reminder  alarms on phone for 7 am and 8 pm Counsel patient on importance of storing medication away from heat, direct light and moisture  Hypertension: Current treatment: losartan-HCTZ 100-25 mg daily Patient has home wrist BP monitor Have encouraged patient to consider obtaining home upper arm BP monitor for greater accuracy  Reports recent home BP reading: 10/5: 132/87, did not record HR Encourage patient to start monitoring home BP and HR 1-2 times week and to keep a log of the results Will mail patient BP log and educational handout on BP monitoring technique as requested  Type 2 Diabetes: Controlled: current treatment: none  Hyperlipidemia: Controlled: current treatment: atorvastatin 40 mg daily  Patient Goals/Self-Care Activities Over the next 90 days, patient will:  - focus on medication adherence by:   Using weekly pillbox   Using medication reminder alarms on phone for 7 am and 8 pm  Follow Up Plan: Telephone follow up appointment with care management team member scheduled for: 12/18/2020 at 9:15 AM      Patient's preferred pharmacy is:  [id number] [place], [place] - [address] AT [facility] OF [address] [place] [address] [place] [address] Phone: [phone] Fax: [phone]   Follow Up:  Patient agrees to Care Plan and Follow-up.  Estelle Grumbles, PharmD, Patsy [place], CPP Clinical Pharmacist [facility] [phone]

## 2020-12-09 DIAGNOSIS — M5416 Radiculopathy, lumbar region: Secondary | ICD-10-CM | POA: Diagnosis not present

## 2020-12-09 DIAGNOSIS — M48062 Spinal stenosis, lumbar region with neurogenic claudication: Secondary | ICD-10-CM | POA: Diagnosis not present

## 2020-12-18 ENCOUNTER — Ambulatory Visit: Payer: Medicare Other | Admitting: Pharmacist

## 2020-12-18 DIAGNOSIS — E1169 Type 2 diabetes mellitus with other specified complication: Secondary | ICD-10-CM

## 2020-12-18 DIAGNOSIS — E785 Hyperlipidemia, unspecified: Secondary | ICD-10-CM

## 2020-12-18 DIAGNOSIS — I10 Essential (primary) hypertension: Secondary | ICD-10-CM

## 2020-12-18 NOTE — Chronic Care Management (AMB) (Signed)
Chronic Care Management Pharmacy Note  12/18/2020 Name:  Janet [name] MRN:  [id number] DOB:  Mar 04, 1950   Subjective: Janet [name] is an 70 y.o. year old female who is a primary patient of Smitty Cords, DO.  The CCM team was consulted for assistance with disease management and care coordination needs.    Engaged with patient by telephone for follow up visit in response to provider referral for pharmacy case management and/or care coordination services.   Consent to Services:  The patient was given information about Chronic Care Management services, agreed to services, and gave verbal consent prior to initiation of services.  Please see initial visit note for detailed documentation.   Patient Care Team: Smitty Cords, DO as PCP - General (Family Medicine) Marlowe Sax, RN as Registered Nurse (General Practice) [name], [name], RPH-CPP (Pharmacist)  Recent consult visits: Office Visit with [facility] Physical Medicine and Rehabilitation on 10/12  Hospital visits: None in previous 6 months  Objective:  Lab Results  Component Value Date   CREATININE 0.71 09/09/2020   CREATININE 0.65 09/12/2019    Lab Results  Component Value Date   HGBA1C 5.7 (H) 09/09/2020   Last diabetic Eye exam:  Lab Results  Component Value Date/Time   HMDIABEYEEXA No Retinopathy 06/13/2019 12:00 AM    Last diabetic Foot exam: No results found for: HMDIABFOOTEX      Component Value Date/Time   CHOL 133 09/09/2020 0832   TRIG 56 09/09/2020 0832   HDL 52 09/09/2020 0832   CHOLHDL 2.6 09/09/2020 0832   LDLCALC 67 09/09/2020 0832    Hepatic Function Latest Ref Rng & Units 09/09/2020 09/12/2019  Total Protein 6.1 - 8.1 g/dL 6.8 6.6  AST 10 - 35 U/L 16 17  ALT 6 - 29 U/L 14 17  Total Bilirubin 0.2 - 1.2 mg/dL 0.9 1.2   Social History   Tobacco Use  Smoking Status Former   Packs/day: 1.00   Years: 25.00   Pack years: 25.00   Types: Cigarettes  Smokeless  Tobacco Former   BP Readings from Last 3 Encounters:  09/16/20 117/66  04/14/20 139/79  03/03/20 136/85   Pulse Readings from Last 3 Encounters:  09/16/20 69  04/14/20 75  03/03/20 72   Wt Readings from Last 3 Encounters:  10/20/20 210 lb (95.3 kg)  09/16/20 208 lb 12.8 oz (94.7 kg)  04/14/20 206 lb 9.6 oz (93.7 kg)    Assessment: Review of patient past medical history, allergies, medications, health status, including review of consultants reports, laboratory and other test data, was performed as part of comprehensive evaluation and provision of chronic care management services.   SDOH:  (Social Determinants of Health) assessments and interventions performed:    CCM Care Plan  No Known Allergies  Medications Reviewed Today     Reviewed by Manuela Neptune, RPH-CPP (Pharmacist) on 12/04/20 at [phone]  Med List Status: <None>   Medication Order Taking? Sig Documenting Provider Last Dose Status Informant  aspirin 81 MG EC tablet [id number]  Take 81 mg by mouth daily. [provider]  Active   atorvastatin (LIPITOR) 40 MG tablet [id number]  Take 40 mg by mouth at bedtime. [provider]  Active   baclofen (LIORESAL) 10 MG tablet [id number] No Take by mouth.  Patient not taking: No sig reported   [provider] Not Taking Active   DULoxetine (CYMBALTA) 30 MG capsule [id number]  Take 1 capsule (30 mg total) by mouth daily.  Karamalegos, Netta Neat, DO  Active   gabapentin (NEURONTIN) 300 MG capsule [id number] Yes Take 300 mg by mouth 3 (three) times daily. [provider] Taking Active            Med Note [name], [facility] A   Fri Dec 04, 2020  9:37 AM) Taking 1 capsule (300 mg) in the morning and 2 capsules  (600 mg) at night as directed by Neurologist.  losartan-hydrochlorothiazide (HYZAAR) 100-25 MG tablet [id number] Yes Take 1 tablet by mouth daily. Smitty Cords, DO Taking Active   meloxicam (MOBIC) 15 MG tablet [id number]  Take 15 mg  by mouth daily as needed. [provider]  Active   pramipexole (MIRAPEX) 1 MG tablet [id number]  Take 1 mg by mouth 2 (two) times daily. [provider]  Active             Patient Active Problem List   Diagnosis Date Noted   Chronic left-sided low back pain with left-sided sciatica 04/14/2020   History of deep vein thrombosis 09/16/2019   Type 2 diabetes mellitus with other specified complication (HCC) 08/20/2019   Restless leg syndrome 08/20/2019   Major depression, recurrent, full remission (HCC) 08/20/2019   Other insomnia 08/20/2019   Symptomatic varicose veins, bilateral 08/20/2019   Edema of left lower extremity 08/20/2019   Primary osteoarthritis involving multiple joints 08/20/2019   Venous insufficiency of both lower extremities 08/20/2019   History of bariatric surgery 08/20/2019   Cardiac pacemaker 08/20/2019   Anxiety disorder 10/31/2018   Edema 10/31/2018   Heart murmur 10/31/2018   Osteoporosis 10/31/2018   Primary osteoarthritis of left knee 10/19/2018   History of pacemaker 09/12/2018   Shortness of breath 09/12/2018   Palpitations 06/20/2016   Postmenopausal bleeding 08/04/2015   Lymphedema 12/16/2014   Osteoarthritis 07/31/2014   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   Gastroesophageal reflux disease 07/31/2014   Mixed hyperlipidemia 07/31/2014   Morbid obesity (HCC) 07/31/2014   Obstructive sleep apnea syndrome 07/31/2014   Peripheral nerve disease 07/31/2014   Urinary incontinence 07/31/2014   History of adenomatous polyp of colon 02/28/2014    Immunization History  Administered Date(s) Administered   Fluad Quad(high Dose 65+) 03/03/2020   Influenza, High Dose Seasonal PF 01/09/2018, 11/21/2018, 11/21/2018   Influenza, Seasonal, Injecte, Preservative Fre 12/16/2014   Moderna Sars-Covid-2 Vaccination 03/27/2019, 04/26/2019   PFIZER(Purple Top)SARS-COV-2 Vaccination 01/09/2020, 07/08/2020   Pneumococcal Conjugate-13 01/07/2018   Pneumococcal  Polysaccharide-23 09/19/2019   Zoster Recombinat (Shingrix) 07/09/2020, 09/21/2020    Conditions to be addressed/monitored: HTN, HLD, T2DM  Care Plan : PharmD - Medication Adherence  Updates made by Manuela Neptune, RPH-CPP since 12/18/2020 12:00 AM     Problem: Disease Progression      Long-Range Goal: Disease Progression Prevented or Minimized   Start Date: 11/27/2020  Expected End Date: 02/25/2021  Recent Progress: On track  Priority: High  Note:   Current Barriers:  Unable to self administer medications as prescribed  Pharmacist Clinical Goal(s):  Over the next 90 days, patient will achieve adherence to monitoring guidelines and medication adherence to achieve therapeutic efficacy through collaboration with PharmD and provider.    Interventions: 1:1 collaboration with Smitty Cords, DO regarding development and update of comprehensive plan of care as evidenced by provider attestation and co-signature Inter-disciplinary care team collaboration (see longitudinal plan of care) Perform chart review: Office Visit with [facility] Physical Medicine and Rehabilitation on 10/12 Today reports feels like injection received on 10/12 provided better pain relief left leg  and back  Medication adherence: Reports using weekly pillbox and twice daily phone reminder alarms, but reduced medication adherence over the past week Reports missed doses ~every other day, most often in the morning Reports missing alarms because is busy with caring for her family when the alarms go off Discuss importance of medication adherence Patient will set reminder alarms instead for 11 am and 10 pm to help with adherence  Hypertension: Current treatment: losartan-HCTZ 100-25 mg daily Reports now has an upper arm BP monitor, but denies having checked home BP Will put at bedside table to help remind her Counsel on BP monitoring technique Encourage patient to start monitoring home BP and HR  1-2 times week and to keep a log of the results  Type 2 Diabetes: Controlled: current treatment: none  Hyperlipidemia: Controlled: current treatment: atorvastatin 40 mg daily  Patient Goals/Self-Care Activities Over the next 90 days, patient will:  - focus on medication adherence by:   Using weekly pillbox   Using medication reminder alarms on phone for 11 am and 10 pm  Follow Up Plan: Telephone follow up appointment with care management team member scheduled for: 10/28 at 11:45 am      Patient's preferred pharmacy is:  [facility] #[id number] - [place], [place] - [address] AT [facility] OF [address] [address] [place] [address] Phone: [phone] Fax: [phone]   Follow Up:  Patient agrees to Care Plan and Follow-up.  Estelle Grumbles, PharmD, Patsy [place], CPP Clinical Pharmacist [facility] [phone]

## 2020-12-18 NOTE — Patient Instructions (Signed)
Visit Information  PATIENT GOALS:  Goals Addressed             This Visit's Progress    Pharmacy Goals       Please check your home blood pressure, keep a log of the results and bring this with you to your medical appointments.  Feel free to call me with any questions or concerns. I look forward to our next call!  Estelle Grumbles, PharmD, Patsy [place], CPP Clinical Pharmacist [facility] [phone]        The patient verbalized understanding of instructions, educational materials, and care plan provided today and declined offer to receive copy of patient instructions, educational materials, and care plan.   Telephone follow up appointment with care management team member scheduled for: 10/28 at 11:45 am

## 2020-12-23 ENCOUNTER — Ambulatory Visit: Payer: Medicare Other | Admitting: Pharmacist

## 2020-12-23 DIAGNOSIS — I10 Essential (primary) hypertension: Secondary | ICD-10-CM

## 2020-12-23 DIAGNOSIS — E785 Hyperlipidemia, unspecified: Secondary | ICD-10-CM

## 2020-12-23 NOTE — Chronic Care Management (AMB) (Signed)
Chronic Care Management Pharmacy Note  12/23/2020 Name:  Janet [name] MRN:  [id number] DOB:  [birth year]   Subjective: Janet [name] is an 70 y.o. year old female who is a primary patient of Smitty Cords, DO.  The CCM team was consulted for assistance with disease management and care coordination needs.    Engaged with patient by telephone for follow up visit in response to provider referral for pharmacy case management and/or care coordination services.   Consent to Services:  The patient was given information about Chronic Care Management services, agreed to services, and gave verbal consent prior to initiation of services.  Please see initial visit note for detailed documentation.   Patient Care Team: Smitty Cords, DO as PCP - General (Family Medicine) Marlowe Sax, RN as Registered Nurse (General Practice) Manuela Neptune, RPH-CPP (Pharmacist)  Objective:  Lab Results  Component Value Date   CREATININE 0.71 09/09/2020   CREATININE 0.65 09/12/2019       Component Value Date/Time   CHOL 133 09/09/2020 0832   TRIG 56 09/09/2020 0832   HDL 52 09/09/2020 0832   CHOLHDL 2.6 09/09/2020 0832   LDLCALC 67 09/09/2020 0832    Hepatic Function Latest Ref Rng & Units 09/09/2020 09/12/2019  Total Protein 6.1 - 8.1 g/dL 6.8 6.6  AST 10 - 35 U/L 16 17  ALT 6 - 29 U/L 14 17  Total Bilirubin 0.2 - 1.2 mg/dL 0.9 1.2    Social History   Tobacco Use  Smoking Status Former   Packs/day: 1.00   Years: 25.00   Pack years: 25.00   Types: Cigarettes  Smokeless Tobacco Former   BP Readings from Last 3 Encounters:  09/16/20 117/66  04/14/20 139/79  03/03/20 136/85   Pulse Readings from Last 3 Encounters:  09/16/20 69  04/14/20 75  03/03/20 72   Wt Readings from Last 3 Encounters:  10/20/20 210 lb (95.3 kg)  09/16/20 208 lb 12.8 oz (94.7 kg)  04/14/20 206 lb 9.6 oz (93.7 kg)    Assessment: Review of patient past medical history, allergies,  medications, health status, including review of consultants reports, laboratory and other test data, was performed as part of comprehensive evaluation and provision of chronic care management services.   SDOH:  (Social Determinants of Health) assessments and interventions performed:    CCM Care Plan  No Known Allergies  Medications Reviewed Today     Reviewed by Manuela Neptune, RPH-CPP (Pharmacist) on 12/04/20 at [phone]  Med List Status: <None>   Medication Order Taking? Sig Documenting Provider Last Dose Status Informant  aspirin 81 MG EC tablet [id number]  Take 81 mg by mouth daily. [provider]  Active   atorvastatin (LIPITOR) 40 MG tablet [id number]  Take 40 mg by mouth at bedtime. [provider]  Active   baclofen (LIORESAL) 10 MG tablet [id number] No Take by mouth.  Patient not taking: No sig reported   [provider] Not Taking Active   DULoxetine (CYMBALTA) 30 MG capsule [id number]  Take 1 capsule (30 mg total) by mouth daily. Karamalegos, Netta Neat, DO  Active   gabapentin (NEURONTIN) 300 MG capsule [id number] Yes Take 300 mg by mouth 3 (three) times daily. [provider] Taking Active            Med Note [name], [facility]   Fri Dec 04, 2020  9:37 AM) Taking 1 capsule (300 mg) in the morning and 2 capsules  (600 mg) at  night as directed by Neurologist.  losartan-hydrochlorothiazide (HYZAAR) 100-25 MG tablet [id number] Yes Take 1 tablet by mouth daily. Smitty Cords, DO Taking Active   meloxicam (MOBIC) 15 MG tablet [id number]  Take 15 mg by mouth daily as needed. [provider]  Active   pramipexole (MIRAPEX) 1 MG tablet [id number]  Take 1 mg by mouth 2 (two) times daily. [provider]  Active             Patient Active Problem List   Diagnosis Date Noted   Chronic left-sided low back pain with left-sided sciatica 04/14/2020   History of deep vein thrombosis 09/16/2019   Type 2 diabetes mellitus  with other specified complication (HCC) 08/20/2019   Restless leg syndrome 08/20/2019   Major depression, recurrent, full remission (HCC) 08/20/2019   Other insomnia 08/20/2019   Symptomatic varicose veins, bilateral 08/20/2019   Edema of left lower extremity 08/20/2019   Primary osteoarthritis involving multiple joints 08/20/2019   Venous insufficiency of both lower extremities 08/20/2019   History of bariatric surgery 08/20/2019   Cardiac pacemaker 08/20/2019   Anxiety disorder 10/31/2018   Edema 10/31/2018   Heart murmur 10/31/2018   Osteoporosis 10/31/2018   Primary osteoarthritis of left knee 10/19/2018   History of pacemaker 09/12/2018   Shortness of breath 09/12/2018   Palpitations 06/20/2016   Postmenopausal bleeding 08/04/2015   Lymphedema 12/16/2014   Osteoarthritis 07/31/2014   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   Gastroesophageal reflux disease 07/31/2014   Mixed hyperlipidemia 07/31/2014   Morbid obesity (HCC) 07/31/2014   Obstructive sleep apnea syndrome 07/31/2014   Peripheral nerve disease 07/31/2014   Urinary incontinence 07/31/2014   History of adenomatous polyp of colon 02/28/2014    Immunization History  Administered Date(s) Administered   Fluad Quad(high Dose 65+) 03/03/2020   Influenza, High Dose Seasonal PF 01/09/2018, 11/21/2018, 11/21/2018   Influenza, Seasonal, Injecte, Preservative Fre 12/16/2014   Moderna Sars-Covid-2 Vaccination 03/27/2019, 04/26/2019   PFIZER(Purple Top)SARS-COV-2 Vaccination 01/09/2020, 07/08/2020   Pneumococcal Conjugate-13 01/07/2018   Pneumococcal Polysaccharide-23 09/19/2019   Zoster Recombinat (Shingrix) 07/09/2020, 09/21/2020    Conditions to be addressed/monitored: HTN, HLD, T2DM  Care Plan : PharmD - Medication Adherence  Updates made by Manuela Neptune, RPH-CPP since 12/23/2020 12:00 AM     Problem: Disease Progression      Long-Range Goal: Disease Progression Prevented or Minimized   Start Date: 11/27/2020   Expected End Date: 02/25/2021  This Visit's Progress: On track  Recent Progress: On track  Priority: High  Note:   Current Barriers:  Unable to self administer medications as prescribed  Pharmacist Clinical Goal(s):  Over the next 90 days, patient will achieve adherence to monitoring guidelines and medication adherence to achieve therapeutic efficacy through collaboration with PharmD and provider.    Interventions: 1:1 collaboration with Smitty Cords, DO regarding development and update of comprehensive plan of care as evidenced by provider attestation and co-signature Inter-disciplinary care team collaboration (see longitudinal plan of care)  Medication adherence: Reports using weekly pillbox and twice daily phone reminder alarms, set for 8 am and 9 pm Reports has committed herself to taking the time to take her medications when the alarms go off and is taking doses consistently Reports will be going to [place] 1 month. Discuss planning ahead to make sure that she will have the medications that she needs while away  Hypertension: Current treatment: losartan-HCTZ 100-25 mg daily Reports last checked home BP yesterday: 120/79 Encourage patient to continue monitoring home BP  and HR 1-2 times week and to keep a log of the results  Type 2 Diabetes: Controlled: current treatment: none  Hyperlipidemia: Controlled: current treatment: atorvastatin 40 mg daily  Patient Goals/Self-Care Activities Over the next 90 days, patient will:  - focus on medication adherence by:   Using weekly pillbox   Using medication reminder alarms on phone for 8 am and 9 pm  Follow Up Plan: Telephone follow up appointment with care management team member scheduled for: 12/28 at 9:15 am      Patient's preferred pharmacy is:  [id number] - [place], [place] - [address] AT [facility] [address] [place] [address] [place] [address] Phone: [phone] Fax:  [phone]   Follow Up:  Patient agrees to Care Plan and Follow-up.  Estelle Grumbles, PharmD, Patsy [place], CPP Clinical Pharmacist [facility] [phone]

## 2020-12-23 NOTE — Patient Instructions (Signed)
Visit Information  PATIENT GOALS:  Goals Addressed             This Visit's Progress    Pharmacy Goals       Please check your home blood pressure, keep a log of the results and bring this with you to your medical appointments.  Feel free to call me with any questions or concerns. I look forward to our next call!   Estelle Grumbles, PharmD, Patsy [place], CPP Clinical Pharmacist [facility] [phone]        The patient verbalized understanding of instructions, educational materials, and care plan provided today and declined offer to receive copy of patient instructions, educational materials, and care plan.   Telephone follow up appointment with care management team member scheduled for: 12/28 at 9:15 am

## 2020-12-25 ENCOUNTER — Telehealth: Payer: Medicare Other

## 2020-12-28 DIAGNOSIS — E1169 Type 2 diabetes mellitus with other specified complication: Secondary | ICD-10-CM

## 2020-12-28 DIAGNOSIS — E785 Hyperlipidemia, unspecified: Secondary | ICD-10-CM | POA: Diagnosis not present

## 2020-12-28 DIAGNOSIS — F3342 Major depressive disorder, recurrent, in full remission: Secondary | ICD-10-CM

## 2020-12-28 DIAGNOSIS — F331 Major depressive disorder, recurrent, moderate: Secondary | ICD-10-CM | POA: Diagnosis not present

## 2020-12-28 DIAGNOSIS — M159 Polyosteoarthritis, unspecified: Secondary | ICD-10-CM

## 2020-12-28 DIAGNOSIS — I10 Essential (primary) hypertension: Secondary | ICD-10-CM | POA: Diagnosis not present

## 2020-12-29 ENCOUNTER — Ambulatory Visit (INDEPENDENT_AMBULATORY_CARE_PROVIDER_SITE_OTHER): Payer: Medicare Other | Admitting: Licensed Clinical Social Worker

## 2020-12-29 DIAGNOSIS — F411 Generalized anxiety disorder: Secondary | ICD-10-CM

## 2020-12-29 DIAGNOSIS — E1169 Type 2 diabetes mellitus with other specified complication: Secondary | ICD-10-CM

## 2020-12-29 DIAGNOSIS — G4709 Other insomnia: Secondary | ICD-10-CM

## 2020-12-29 DIAGNOSIS — F3342 Major depressive disorder, recurrent, in full remission: Secondary | ICD-10-CM

## 2020-12-29 NOTE — Chronic Care Management (AMB) (Signed)
    Clinical Social Work  Care Management   Phone Outreach    12/29/2020 Name: Janet [name] MRN: [id number] DOB: [birth year]  Janet [name] is a 70 y.o. year old female who is a primary care patient of Smitty Cords, DO .   Reason for referral: Mental Health Counseling and Resources.    CCM LCSW conducted a chart review and collaborated with CCM RNCM, [place], regarding patient needs. LCSW reached out to patient today by phone to introduce self, assess needs and offer Care Management services and interventions.     Plan: Appointment was scheduled with CCM LCSW  Review of patient status, including review of consultants reports, relevant laboratory and other test results, and collaboration with appropriate care team members and the patient's provider was performed as part of comprehensive patient evaluation and provision of care management services.     Janet [name], MSW, LCSW Janet [name] [facility] Care Management [place]  [facility] [email or web address] Phone [phone] 5:16 PM

## 2021-01-01 DIAGNOSIS — M48062 Spinal stenosis, lumbar region with neurogenic claudication: Secondary | ICD-10-CM | POA: Diagnosis not present

## 2021-01-01 DIAGNOSIS — M5416 Radiculopathy, lumbar region: Secondary | ICD-10-CM | POA: Diagnosis not present

## 2021-01-01 DIAGNOSIS — M5136 Other intervertebral disc degeneration, lumbar region: Secondary | ICD-10-CM | POA: Diagnosis not present

## 2021-01-13 ENCOUNTER — Telehealth: Payer: Medicare Other

## 2021-01-18 ENCOUNTER — Telehealth: Payer: Self-pay | Admitting: Licensed Clinical Social Worker

## 2021-01-18 NOTE — Telephone Encounter (Signed)
    Clinical Social Work  Care Management   Phone Outreach    01/18/2021 Name: Janet [name] MRN: [id number] DOB: 1950-10-29  Janet [name] is a 70 y.o. year old female who is a primary care patient of Smitty Cords, DO .   Reason for referral: Mental Health Counseling and Resources.    CCM LCSW reached out to patient today by phone to introduce self, assess needs and offer Care Management services and interventions.    Telephone outreach was unsuccessful. A HIPPA compliant phone message was left for the patient providing contact information and requesting a return call.   Plan:Will route chart to Care Guide to see if patient would like to reschedule phone appointment   Review of patient status, including review of consultants reports, relevant laboratory and other test results, and collaboration with appropriate care team members and the patient's provider was performed as part of comprehensive patient evaluation and provision of care management services.     Jenel Lucks, MSW, LCSW Lutricia Horsfall [facility] Care Management [place]  [facility] [email or web address] Phone [phone] 4:26 AM

## 2021-01-25 ENCOUNTER — Ambulatory Visit: Payer: Self-pay

## 2021-01-25 ENCOUNTER — Telehealth: Payer: Medicare Other | Admitting: General Practice

## 2021-01-25 DIAGNOSIS — F3342 Major depressive disorder, recurrent, in full remission: Secondary | ICD-10-CM

## 2021-01-25 DIAGNOSIS — F411 Generalized anxiety disorder: Secondary | ICD-10-CM

## 2021-01-25 DIAGNOSIS — Z9181 History of falling: Secondary | ICD-10-CM

## 2021-01-25 DIAGNOSIS — M159 Polyosteoarthritis, unspecified: Secondary | ICD-10-CM

## 2021-01-25 DIAGNOSIS — E1169 Type 2 diabetes mellitus with other specified complication: Secondary | ICD-10-CM

## 2021-01-25 NOTE — Patient Instructions (Signed)
Visit Information  Thank you for taking time to visit with me today. Please don't hesitate to contact me if I can be of assistance to you before our next scheduled telephone appointment.  Following are the goals we discussed today:  RNCM Clinical Goal(s):  Patient will verbalize understanding of plan for management of HLD, DMII, Anxiety, Depression,  falls prevention, and Chronic pain  verbalize basic understanding of HLD, DMII, Anxiety, Depression, falls prevention, and Chronic pain  disease process and self health management plan  take all medications exactly as prescribed and will call provider for medication related questions demonstrate understanding of rationale for each prescribed medication patient desires to work with pharm D for effective management of chronic conditions and how to take her medications as prescribed demonstrate improved and ongoing adherence to prescribed treatment plan for HLD, DMII, Anxiety, Depression, falls prevention, and chronic pain  as evidenced by adherence to ADA/ carb modified diet adherence to prescribed medication regimen contacting provider for new or worsened symptoms or questions and working with the CCM team for effective management of chronic diseases  demonstrate improved and ongoing health management independence by remaining safe in home environment and effective management of Chronic conditions  continue to work with Medical illustrator to address care management and care coordination needs related to HLD, DMII, Anxiety, Depression, and chronic pain and fall prevention   work with pharmacist to address medication concerns as the patient admits she does not take her medications as prescribed and struggles with managing her medications related to HLD, DMII, Anxiety, Depression, and chronic pain and falls prevention and safety through collaboration with RN Care manager, provider, and care team.    Interventions: 1:1 collaboration with primary care provider  regarding development and update of comprehensive plan of care as evidenced by provider attestation and co-signature Inter-disciplinary care team collaboration (see longitudinal plan of care) Evaluation of current treatment plan related to  self management and patient's adherence to plan as established by provider     SDOH Barriers (Status: Goal on track: YES.)  Patient interviewed and SDOH assessment performed        SDOH Interventions     Flowsheet Row Most Recent Value  SDOH Interventions    Food Insecurity Interventions Intervention Not Indicated  Intimate Partner Violence Interventions Intervention Not Indicated  Physical Activity Interventions Other (Comments)  [no structured activities]  Social Connections Interventions Other (Comment)  [good support system, is moving soon]  Transportation Interventions Intervention Not Indicated       Patient interviewed and appropriate assessments performed Provided patient with information about CCM team and working with the patient to manage chronic conditions and effectively manage health and well being. The patient wants to be proactive in her care and is willing to work with the team Discussed plans with patient for ongoing care management follow up and provided patient with direct contact information for care management team Advised patient to call the office for changes in condition or SDOH needs. The patient states that she is hoping by working with the CCM team she can remember best how to take her medications and take care of herself beter.  Collaborated with RN Case Manager re: ongoing support and education for management of chronic conditions and health and well being goals.        Diabetes:  (Status: Goal on track: YES.)      Lab Results  Component Value Date    HGBA1C 5.7 (H) 09/09/2020  Assessed patient's understanding of A1c goal: <  7% Provided education to patient about basic DM disease process; Reviewed prescribed diet with  patient 11-16-2020: Review of heart healthy/ADA diet. The patient eats small meals and sometimes is not eating what she should. Sometimes eats a bedtime snack sometimes not. The patient encouraged to follow a heart healthy/ADA diet. Will send information by my chart system for review and help with meal choices and options. 11-30-2020: Is eating well and denies any issues with compliance with heart healthy/ADA diet. 01-25-2021: Is in [place] taking care of her elderly father. She states she is actually doing very well right now. Denies any acute issues with management of DM or following dietary restrictions ; Counseled on importance of regular laboratory monitoring as prescribed;        Discussed plans with patient for ongoing care management follow up and provided patient with direct contact information for care management team;      Provided patient with written educational materials related to hypo and hyperglycemia and importance of correct treatment. 11-16-2020: Reviewed with the patient the sx and sx of hypo and hyperglycemia. The patient does not check her blood sugars as recommended by the provider. She does have a meter but does not take medications for DM and mainly controls DM with diet. Education and support given. The patient states that she will start taking blood sugars if she feels different. Denies lows; however states that sometimes at night she experiences "sweating". Eduction and support given. Will continue to monitor. 11-30-2020: The patient denies any lows at this time. States her blood sugar this am was 109. States she is doing better with management of her overall health and well being. Will continue to monitor.        Review of patient status, including review of consultants reports, relevant laboratory and other test results, and medications completed;         Falls:  (Status: Goal on track: YES.) Provided written and verbal education re: potential causes of falls and Fall prevention  strategies Reviewed medications and discussed potential side effects of medications such as dizziness and frequent urination. 9-19-202: The patient reviewed with the RNCM medications that she takes. Is following up with the pharm D as she forgets to take medications. Will monitor for medications that may cause her to have issues with fall hazards. 01-25-2021: Working with the Pharm D for medications management, questions and concerns Advised patient of importance of notifying provider of falls Assessed for signs and symptoms of orthostatic hypotension. 9-19-202: Denies any issues with orthostatic hypotension. Takes blood pressures sometimes at home. Advised the patient to take blood pressures and record. 01-25-2021: Denies any low blood pressures at this time. Assessed for falls since last encounter. 11-16-2020: The patient states that she has a fall about once a month. Last fall earlier this month. States no injuries. The patient states that she uses a cane when ambulating but sometimes falls because she doesn't use her cane. She is always cooking and cleaning and doing for others. She states its easy for her not to take good care of herself. 01-25-2021: The patient denies any new falls. Review of safety concerns and being cautious to prevent falls and injuries.  Assessed patients knowledge of fall risk prevention secondary to previously provided education Advised patient to discuss new falls, concerns, or questions  with provider   Depression and anxiety   (Status: Goal on track: YES.) Evaluation of current treatment plan related to Anxiety and Depression, Limited social support, Level of care concerns, Mental  Health Concerns , and Family and relationship dysfunction self-management and patient's adherence to plan as established by provider.  01-25-2021: The patient states that she is doing well right now. She is actually in [place] and she will be there until the end of December or later taking care  of her elderly father. Review of medications and chronic conditions and the patient is doing well.  She states she is not stressed out and has her medications. Denies any new concerns.  Discussed plans with patient for ongoing care management follow up and provided patient with direct contact information for care management team Advised patient to call the office for changes in mood, axiety, depression or other mental health concerns; Provided education to patient re: managing stressors in life and working with the CCM team to effectively manage her depression and anxiety ; Reviewed medications with patient and discussed compliance. The patient sometimes forgets to take her medications. Is open to ideas to help her with effective management of medications. 11-30-2020: Is doing better with management of her medications. Working with the pharm D and also setting alarms on her phone for 2 times a day to help her remember to take her medications. 01-25-2021: Endorses compliance with medications. States she has her medications and denies any concerns with medications at this time. Will continue to monitor ; Collaborated with CCM team  regarding complex health needs and the patient wanting help in management of her chronic conditions. The patient states that her and her youngest daughter are going to be moving in about a month. She feels this will help her a whole lot as she is really stressed over cooking, cleaning and helping care for so many family members. She often feels overwhelmed and knows she is not taking care of herself. 11-30-2020: The patient is excited about moving in with her daughter. Her daughter closes on her new house on Friday and the patient will be moving in with her. She states she will still come and help some at her current home but it will be a lot less stressful on her. She is optimistic. Review of self care.  01-25-2021: The patient is doing well currently. Is in [place] taking care of  her elderly father. Does not know when she will return back to [place]. Likely at the end of December.  Provided patient with depression and anxiety  educational materials related to relieving stressors in her life; Social Work referral for help with ongoing support and stress relief measures for effective management of mental health well beig ; Pharmacy referral for help with management of medications and help with system that will help her ; Discussed plans with patient for ongoing care management follow up and provided patient with direct contact information for care management team; Advised patient to discuss new changes and stressors  with provider; Screening for signs and symptoms of depression related to chronic disease state;  Assessed social determinant of health barriers;    Hyperlipidemia:  (Status: Goal on track: YES.)      Lab Results  Component Value Date    CHOL 133 09/09/2020    HDL 52 09/09/2020    LDLCALC 67 09/09/2020    TRIG 56 09/09/2020    CHOLHDL 2.6 09/09/2020      Medication review performed; medication list updated in electronic medical record.  Provider established cholesterol goals reviewed; Counseled on importance of regular laboratory monitoring as prescribed; Provided HLD educational materials; Reviewed role and benefits of statin for ASCVD risk  reduction; Discussed strategies to manage statin-induced myalgias; Reviewed importance of limiting foods high in cholesterol; Reviewed exercise goals and target of 150 minutes per week;   Pain:  (Status: Goal on track: YES.) Pain assessment performed Medications reviewed Reviewed provider established plan for pain management. 11-16-2020: the patient states that she does take Tylenol for pain relief and discomfort at times. She stays very busy and she states she feels somewhat overwhelmed. She rates her pain today at a 4 on a scale of 0-10. 01-25-2021: Is effectively managing pain at this time. Denies any acute  findings. Will continue to monitor.  Discussed importance of adherence to all scheduled medical appointments. 11-30-2020: Reminded the patient of pharm D appointment on 12-04-2020. 01-25-2021: No upcoming appointments. Knows to call for changes or needs  Counseled on the importance of reporting any/all new or changed pain symptoms or management strategies to pain management provider; Advised patient to report to care team affect of pain on daily activities; Discussed use of relaxation techniques and/or diversional activities to assist with pain reduction (distraction, imagery, relaxation, massage, acupressure, TENS, heat, and cold application; Reviewed with patient prescribed pharmacological and nonpharmacological pain relief strategies; Advised patient to discuss call the provider for changes in level or intensity of pain and discomfort with provider;   Patient Goals/Self-Care Activities: Patient will self administer medications as prescribed Patient will attend all scheduled provider appointments Patient will call pharmacy for medication refills Patient will attend church or other social activities Patient will continue to perform ADL's independently Patient will continue to perform IADL's independently Patient will call provider office for new concerns or questions Patient will work with BSW to address care coordination needs and will continue to work with the clinical team to address health care and disease management related needs.        Our next appointment is by telephone on 03-29-2021 at 1030 am  Please call the care guide team at [phone] if you need to cancel or reschedule your appointment.   If you are experiencing a Mental Health or Behavioral Health Crisis or need someone to talk to, please call the Suicide and Crisis Lifeline: 988 call the [place] National Suicide Prevention Lifeline: 971-724-4484 or TTY: 289-135-8936 TTY (367)843-9830) to talk to a trained counselor call  1-800-273-TALK (toll free, 24 hour hotline)   Patient verbalizes understanding of instructions provided today and agrees to view in MyChart.   Alto Denver RN, MSN, CCM Community Care Coordinator [place]  [facility] [place] Mobile: [phone]

## 2021-01-25 NOTE — Chronic Care Management (AMB) (Signed)
Chronic Care Management   CCM RN Visit Note  01/25/2021 Name: Janet [name] MRN: [id number] DOB: Jul 15, 1950  Subjective: Janet [name] is a 70 y.o. year old female who is a primary care patient of Smitty Cords, DO. The care management team was consulted for assistance with disease management and care coordination needs.    Engaged with patient by telephone for follow up visit in response to provider referral for case management and/or care coordination services.   Consent to Services:  The patient was given information about Chronic Care Management services, agreed to services, and gave verbal consent prior to initiation of services.  Please see initial visit note for detailed documentation.   Patient agreed to services and verbal consent obtained.   Assessment: Review of patient past medical history, allergies, medications, health status, including review of consultants reports, laboratory and other test data, was performed as part of comprehensive evaluation and provision of chronic care management services.   SDOH (Social Determinants of Health) assessments and interventions performed:    CCM Care Plan  No Known Allergies  Outpatient Encounter Medications as of 01/25/2021  Medication Sig Note   aspirin 81 MG EC tablet Take 81 mg by mouth daily.    atorvastatin (LIPITOR) 40 MG tablet Take 40 mg by mouth at bedtime.    baclofen (LIORESAL) 10 MG tablet Take by mouth. (Patient not taking: No sig reported)    DULoxetine (CYMBALTA) 30 MG capsule Take 1 capsule (30 mg total) by mouth daily.    gabapentin (NEURONTIN) 300 MG capsule Take 300 mg by mouth 3 (three) times daily. 12/04/2020: Taking 1 capsule (300 mg) in the morning and 2 capsules  (600 mg) at night as directed by Neurologist.   losartan-hydrochlorothiazide (HYZAAR) 100-25 MG tablet Take 1 tablet by mouth daily.    meloxicam (MOBIC) 15 MG tablet Take 15 mg by mouth daily as needed.    pramipexole (MIRAPEX) 1 MG tablet Take  1 mg by mouth 2 (two) times daily.    No facility-administered encounter medications on file as of 01/25/2021.    Patient Active Problem List   Diagnosis Date Noted   Chronic left-sided low back pain with left-sided sciatica 04/14/2020   History of deep vein thrombosis 09/16/2019   Type 2 diabetes mellitus with other specified complication (HCC) 08/20/2019   Restless leg syndrome 08/20/2019   Major depression, recurrent, full remission (HCC) 08/20/2019   Other insomnia 08/20/2019   Symptomatic varicose veins, bilateral 08/20/2019   Edema of left lower extremity 08/20/2019   Primary osteoarthritis involving multiple joints 08/20/2019   Venous insufficiency of both lower extremities 08/20/2019   History of bariatric surgery 08/20/2019   Cardiac pacemaker 08/20/2019   Anxiety disorder 10/31/2018   Edema 10/31/2018   Heart murmur 10/31/2018   Osteoporosis 10/31/2018   Primary osteoarthritis of left knee 10/19/2018   History of pacemaker 09/12/2018   Shortness of breath 09/12/2018   Palpitations 06/20/2016   Postmenopausal bleeding 08/04/2015   Lymphedema 12/16/2014   Osteoarthritis 07/31/2014   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   Gastroesophageal reflux disease 07/31/2014   Mixed hyperlipidemia 07/31/2014   Morbid obesity (HCC) 07/31/2014   Obstructive sleep apnea syndrome 07/31/2014   Peripheral nerve disease 07/31/2014   Urinary incontinence 07/31/2014   History of adenomatous polyp of colon 02/28/2014    Conditions to be addressed/monitored:HLD, DMII, Anxiety, Depression, Osteoarthritis, and falls and safety concerns  Care Plan : RNCM: Adult plan of care for  Chronic Disease Management and Care Coordination Needs  Updates  made by Marlowe Sax, RN since 01/25/2021 12:00 AM     Problem: RNCM: Adult plan of care for Chronic Disease Management and Care Coordination Needs   Priority: High  Onset Date: 11/16/2020     Long-Range Goal: RNCM: Adult Plan of Care for Chronic Disease  Management and Care Coordination Needs   Start Date: 11/16/2020  Expected End Date: 11/16/2021  Recent Progress: On track  Priority: High  Note:   Current Barriers:  Knowledge Deficits related to plan of care for management of HLD, DMII, Anxiety with family circumstances and not caring for self, Depression: depressed mood, and chronic pain (RLS, OA, Chronic back pain),l and falls and safety concerns insomnia, fatigue, anxiety, disturbed sleep,, Caregiver Stress, Grief, Insomnia/Sleep Difficulties, and Stress at family circumstances, and grief process over the loss of her oldest daughter to COVID in October 2021  Care Coordination needs related to Limited social support, Level of care concerns, Mental Health Concerns , and Family and relationship dysfunction  Chronic Disease Management support and education needs related to HLD, DMII, Anxiety with Social Anxiety,, Depression: depressed mood, falls prevention and safety and chronic pain  insomnia, fatigue, anxiety,, Caregiver Stress, Grief, Mood Instability, and Stress at family situations and circumstances, and grief at the loss of her oldest daughter last year in October of 2021 Lacks caregiver support.  Non-adherence to prescribed medication regimen  RNCM Clinical Goal(s):  Patient will verbalize understanding of plan for management of HLD, DMII, Anxiety, Depression,  falls prevention, and Chronic pain  verbalize basic understanding of HLD, DMII, Anxiety, Depression, falls prevention, and Chronic pain  disease process and self health management plan  take all medications exactly as prescribed and will call provider for medication related questions demonstrate understanding of rationale for each prescribed medication patient desires to work with pharm D for effective management of chronic conditions and how to take her medications as prescribed demonstrate improved and ongoing adherence to prescribed treatment plan for HLD, DMII, Anxiety,  Depression, falls prevention, and chronic pain  as evidenced by adherence to ADA/ carb modified diet adherence to prescribed medication regimen contacting provider for new or worsened symptoms or questions and working with the CCM team for effective management of chronic diseases  demonstrate improved and ongoing health management independence by remaining safe in home environment and effective management of Chronic conditions  continue to work with Medical illustrator to address care management and care coordination needs related to HLD, DMII, Anxiety, Depression, and chronic pain and fall prevention   work with pharmacist to address medication concerns as the patient admits she does not take her medications as prescribed and struggles with managing her medications related to HLD, DMII, Anxiety, Depression, and chronic pain and falls prevention and safety through collaboration with RN Care manager, provider, and care team.   Interventions: 1:1 collaboration with primary care provider regarding development and update of comprehensive plan of care as evidenced by provider attestation and co-signature Inter-disciplinary care team collaboration (see longitudinal plan of care) Evaluation of current treatment plan related to  self management and patient's adherence to plan as established by provider   SDOH Barriers (Status: Goal on track: YES.)  Patient interviewed and SDOH assessment performed        SDOH Interventions    Flowsheet Row Most Recent Value  SDOH Interventions   Food Insecurity Interventions Intervention Not Indicated  Intimate Partner Violence Interventions Intervention Not Indicated  Physical Activity Interventions Other (Comments)  [no structured activities]  Social Connections Interventions  Other (Comment)  [good support system, is moving soon]  Transportation Interventions Intervention Not Indicated     Patient interviewed and appropriate assessments performed Provided patient  with information about CCM team and working with the patient to manage chronic conditions and effectively manage health and well being. The patient wants to be proactive in her care and is willing to work with the team Discussed plans with patient for ongoing care management follow up and provided patient with direct contact information for care management team Advised patient to call the office for changes in condition or SDOH needs. The patient states that she is hoping by working with the CCM team she can remember best how to take her medications and take care of herself beter.  Collaborated with RN Case Manager re: ongoing support and education for management of chronic conditions and health and well being goals.     Diabetes:  (Status: Goal on track: YES.) Lab Results  Component Value Date   HGBA1C 5.7 (H) 09/09/2020  Assessed patient's understanding of A1c goal: <7% Provided education to patient about basic DM disease process; Reviewed prescribed diet with patient 11-16-2020: Review of heart healthy/ADA diet. The patient eats small meals and sometimes is not eating what she should. Sometimes eats a bedtime snack sometimes not. The patient encouraged to follow a heart healthy/ADA diet. Will send information by my chart system for review and help with meal choices and options. 11-30-2020: Is eating well and denies any issues with compliance with heart healthy/ADA diet. 01-25-2021: Is in [place] care of her elderly father. She states she is actually doing very well right now. Denies any acute issues with management of DM or following dietary restrictions ; Counseled on importance of regular laboratory monitoring as prescribed;        Discussed plans with patient for ongoing care management follow up and provided patient with direct contact information for care management team;      Provided patient with written educational materials related to hypo and hyperglycemia and importance of  correct treatment. 11-16-2020: Reviewed with the patient the sx and sx of hypo and hyperglycemia. The patient does not check her blood sugars as recommended by the provider. She does have a meter but does not take medications for DM and mainly controls DM with diet. Education and support given. The patient states that she will start taking blood sugars if she feels different. Denies lows; however states that sometimes at night she experiences "sweating". Eduction and support given. Will continue to monitor. 11-30-2020: The patient denies any lows at this time. States her blood sugar this am was 109. States she is doing better with management of her overall health and well being. Will continue to monitor.        Review of patient status, including review of consultants reports, relevant laboratory and other test results, and medications completed;        Falls:  (Status: Goal on track: YES.) Provided written and verbal education re: potential causes of falls and Fall prevention strategies Reviewed medications and discussed potential side effects of medications such as dizziness and frequent urination. 9-19-202: The patient reviewed with the RNCM medications that she takes. Is following up with the pharm D as she forgets to take medications. Will monitor for medications that may cause her to have issues with fall hazards. 01-25-2021: Working with the Pharm D for medications management, questions and concerns Advised patient of importance of notifying provider of falls Assessed for signs and  symptoms of orthostatic hypotension. 9-19-202: Denies any issues with orthostatic hypotension. Takes blood pressures sometimes at home. Advised the patient to take blood pressures and record. 01-25-2021: Denies any low blood pressures at this time. Assessed for falls since last encounter. 11-16-2020: The patient states that she has a fall about once a month. Last fall earlier this month. States no injuries. The patient states  that she uses a cane when ambulating but sometimes falls because she doesn't use her cane. She is always cooking and cleaning and doing for others. She states its easy for her not to take good care of herself. 01-25-2021: The patient denies any new falls. Review of safety concerns and being cautious to prevent falls and injuries.  Assessed patients knowledge of fall risk prevention secondary to previously provided education Advised patient to discuss new falls, concerns, or questions  with provider  Depression and anxiety   (Status: Goal on track: YES.) Evaluation of current treatment plan related to Anxiety and Depression, Limited social support, Level of care concerns, Mental Health Concerns , and Family and relationship dysfunction self-management and patient's adherence to plan as established by provider.  01-25-2021: The patient states that she is doing well right now. She is actually in [place] and she will be there until the end of December or later taking care of her elderly father. Review of medications and chronic conditions and the patient is doing well.  She states she is not stressed out and has her medications. Denies any new concerns.  Discussed plans with patient for ongoing care management follow up and provided patient with direct contact information for care management team Advised patient to call the office for changes in mood, axiety, depression or other mental health concerns; Provided education to patient re: managing stressors in life and working with the CCM team to effectively manage her depression and anxiety ; Reviewed medications with patient and discussed compliance. The patient sometimes forgets to take her medications. Is open to ideas to help her with effective management of medications. 11-30-2020: Is doing better with management of her medications. Working with the pharm D and also setting alarms on her phone for 2 times a day to help her remember to take her  medications. 01-25-2021: Endorses compliance with medications. States she has her medications and denies any concerns with medications at this time. Will continue to monitor ; Collaborated with CCM team  regarding complex health needs and the patient wanting help in management of her chronic conditions. The patient states that her and her youngest daughter are going to be moving in about a month. She feels this will help her a whole lot as she is really stressed over cooking, cleaning and helping care for so many family members. She often feels overwhelmed and knows she is not taking care of herself. 11-30-2020: The patient is excited about moving in with her daughter. Her daughter closes on her new house on Friday and the patient will be moving in with her. She states she will still come and help some at her current home but it will be a lot less stressful on her. She is optimistic. Review of self care.  01-25-2021: The patient is doing well currently. Is in [place] taking care of her elderly father. Does not know when she will return back to [place]. Likely at the end of December.  Provided patient with depression and anxiety  educational materials related to relieving stressors in her life; Social Work referral for help with  ongoing support and stress relief measures for effective management of mental health well beig ; Pharmacy referral for help with management of medications and help with system that will help her ; Discussed plans with patient for ongoing care management follow up and provided patient with direct contact information for care management team; Advised patient to discuss new changes and stressors  with provider; Screening for signs and symptoms of depression related to chronic disease state;  Assessed social determinant of health barriers;   Hyperlipidemia:  (Status: Goal on track: YES.) Lab Results  Component Value Date   CHOL 133 09/09/2020   HDL 52 09/09/2020   LDLCALC 67  09/09/2020   TRIG 56 09/09/2020   CHOLHDL 2.6 09/09/2020     Medication review performed; medication list updated in electronic medical record.  Provider established cholesterol goals reviewed; Counseled on importance of regular laboratory monitoring as prescribed; Provided HLD educational materials; Reviewed role and benefits of statin for ASCVD risk reduction; Discussed strategies to manage statin-induced myalgias; Reviewed importance of limiting foods high in cholesterol; Reviewed exercise goals and target of 150 minutes per week;  Pain:  (Status: Goal on track: YES.) Pain assessment performed Medications reviewed Reviewed provider established plan for pain management. 11-16-2020: the patient states that she does take Tylenol for pain relief and discomfort at times. She stays very busy and she states she feels somewhat overwhelmed. She rates her pain today at a 4 on a scale of 0-10. 01-25-2021: Is effectively managing pain at this time. Denies any acute findings. Will continue to monitor.  Discussed importance of adherence to all scheduled medical appointments. 11-30-2020: Reminded the patient of pharm D appointment on 12-04-2020. 01-25-2021: No upcoming appointments. Knows to call for changes or needs  Counseled on the importance of reporting any/all new or changed pain symptoms or management strategies to pain management provider; Advised patient to report to care team affect of pain on daily activities; Discussed use of relaxation techniques and/or diversional activities to assist with pain reduction (distraction, imagery, relaxation, massage, acupressure, TENS, heat, and cold application; Reviewed with patient prescribed pharmacological and nonpharmacological pain relief strategies; Advised patient to discuss call the provider for changes in level or intensity of pain and discomfort with provider;  Patient Goals/Self-Care Activities: Patient will self administer medications as  prescribed Patient will attend all scheduled provider appointments Patient will call pharmacy for medication refills Patient will attend church or other social activities Patient will continue to perform ADL's independently Patient will continue to perform IADL's independently Patient will call provider office for new concerns or questions Patient will work with BSW to address care coordination needs and will continue to work with the clinical team to address health care and disease management related needs.         Plan:Telephone follow up appointment with care management team member scheduled for:  03-29-2021 at 1030 am  Alto Denver RN, MSN, CCM Community Care Coordinator [facility]  [facility] [place] Mobile: [phone]

## 2021-01-27 DIAGNOSIS — F3342 Major depressive disorder, recurrent, in full remission: Secondary | ICD-10-CM | POA: Diagnosis not present

## 2021-01-27 DIAGNOSIS — E785 Hyperlipidemia, unspecified: Secondary | ICD-10-CM | POA: Diagnosis not present

## 2021-01-27 DIAGNOSIS — M159 Polyosteoarthritis, unspecified: Secondary | ICD-10-CM

## 2021-01-27 DIAGNOSIS — E1169 Type 2 diabetes mellitus with other specified complication: Secondary | ICD-10-CM | POA: Diagnosis not present

## 2021-02-02 DIAGNOSIS — Z23 Encounter for immunization: Secondary | ICD-10-CM | POA: Diagnosis not present

## 2021-02-24 ENCOUNTER — Other Ambulatory Visit: Payer: Self-pay | Admitting: Family Medicine

## 2021-02-24 ENCOUNTER — Telehealth: Payer: Medicare Other

## 2021-02-24 ENCOUNTER — Ambulatory Visit (INDEPENDENT_AMBULATORY_CARE_PROVIDER_SITE_OTHER): Payer: Medicare Other | Admitting: Licensed Clinical Social Worker

## 2021-02-24 DIAGNOSIS — G4709 Other insomnia: Secondary | ICD-10-CM

## 2021-02-24 DIAGNOSIS — F3342 Major depressive disorder, recurrent, in full remission: Secondary | ICD-10-CM

## 2021-02-24 DIAGNOSIS — F411 Generalized anxiety disorder: Secondary | ICD-10-CM

## 2021-02-24 DIAGNOSIS — M159 Polyosteoarthritis, unspecified: Secondary | ICD-10-CM

## 2021-02-24 DIAGNOSIS — E1169 Type 2 diabetes mellitus with other specified complication: Secondary | ICD-10-CM

## 2021-02-25 NOTE — Chronic Care Management (AMB) (Signed)
°Chronic Care Management  ° ° Clinical Social Work Note ° °02/25/2021 °Name: Janet [name] MRN: [id number] DOB: 07/13/1950 ° °Janet [name] is a 70 y.o. year old female who is a primary care patient of Karamalegos, Janet J, DO. The CCM team was consulted to assist the patient with chronic disease management and/or care coordination needs related to: Mental Health Counseling and Resources.  ° °Engaged with patient by telephone for initial visit in response to provider referral for social work chronic care management and care coordination services.  ° °Consent to Services:  °The patient was given the following information about Chronic Care Management services today, agreed to services, and gave verbal consent: 1. CCM service includes personalized support from designated clinical staff supervised by the primary care provider, including individualized plan of care and coordination with other care providers 2. 24/7 contact phone numbers for assistance for urgent and routine care needs. 3. Service will only be billed when office clinical staff spend 20 minutes or more in a month to coordinate care. 4. Only one practitioner may furnish and bill the service in a calendar month. 5.The patient may stop CCM services at any time (effective at the end of the month) by phone call to the office staff. 6. The patient will be responsible for cost sharing (co-pay) of up to 20% of the service fee (after annual deductible is met). Patient agreed to services and consent obtained. ° °Patient agreed to services and consent obtained.  ° °Consent to Services:  °The patient was given information about Care Management services, agreed to services, and gave verbal consent prior to initiation of services.  Please see initial visit note for detailed documentation.  ° °Patient agreed to services today and consent obtained.  °Engaged with patient by phone in response to provider referral for social work care coordination services:   °Assessment/Interventions: Assessed patient's previous and current treatment, coping skills, support system and barriers to care. Patient has difficulty managing mental health triggered by stress surrounding family. Stress management strategies discussed and pt was successful identifying healthy coping skills.  °See Care Plan below for interventions and patient self-care activities. ° °Recent life changes or stressors: Caregiver and family stress ° °Recommendation: Patient may benefit from, and is in agreement work with LCSW to address care coordination needs and will continue to work with the clinical team to address health care and disease management related needs. ° °Follow up Plan: Patient would like continued follow-up from CCM LCSW.  per patient's request will follow up in 05/04/21.  Will call office if needed prior to next encounter.    ° °SDOH (Social Determinants of Health) assessments and interventions performed:  °SDOH Interventions   ° °Flowsheet Row Most Recent Value  °SDOH Interventions   °Housing Interventions Intervention Not Indicated  ° °  °  ° °Advanced Directives Status: Not addressed in this encounter. ° °CCM Care Plan ° °No Known Allergies ° °Outpatient Encounter Medications as of 02/24/2021  °Medication Sig Note  ° aspirin 81 MG EC tablet Take 81 mg by mouth daily.   ° atorvastatin (LIPITOR) 40 MG tablet Take 40 mg by mouth at bedtime.   ° baclofen (LIORESAL) 10 MG tablet Take by mouth. (Patient not taking: No sig reported)   ° DULoxetine (CYMBALTA) 30 MG capsule Take 1 capsule (30 mg total) by mouth daily.   ° gabapentin (NEURONTIN) 300 MG capsule Take 300 mg by mouth 3 (three) times daily. 12/04/2020: Taking 1 capsule (300 mg) in the morning and 2   capsules  (600 mg) at night as directed by Neurologist.  ° losartan-hydrochlorothiazide (HYZAAR) 100-25 MG tablet Take 1 tablet by mouth daily.   ° meloxicam (MOBIC) 15 MG tablet Take 15 mg by mouth daily as needed.   ° pramipexole (MIRAPEX) 1 MG  tablet Take 1 mg by mouth 2 (two) times daily.   ° °No facility-administered encounter medications on file as of 02/24/2021.  ° ° °Patient Active Problem List  ° Diagnosis Date Noted  ° Chronic left-sided low back pain with left-sided sciatica 04/14/2020  ° History of deep vein thrombosis 09/16/2019  ° Type 2 diabetes mellitus with other specified complication (HCC) 08/20/2019  ° Restless leg syndrome 08/20/2019  ° Major depression, recurrent, full remission (HCC) 08/20/2019  ° Other insomnia 08/20/2019  ° Symptomatic varicose veins, bilateral 08/20/2019  ° Edema of left lower extremity 08/20/2019  ° Primary osteoarthritis involving multiple joints 08/20/2019  ° Venous insufficiency of both lower extremities 08/20/2019  ° History of bariatric surgery 08/20/2019  ° Cardiac pacemaker 08/20/2019  ° Anxiety disorder 10/31/2018  ° Edema 10/31/2018  ° Heart murmur 10/31/2018  ° Osteoporosis 10/31/2018  ° Primary osteoarthritis of left knee 10/19/2018  ° History of pacemaker 09/12/2018  ° Shortness of breath 09/12/2018  ° Palpitations 06/20/2016  ° Postmenopausal bleeding 08/04/2015  ° Lymphedema 12/16/2014  ° Osteoarthritis 07/31/2014  ° 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  ° Gastroesophageal reflux disease 07/31/2014  ° Mixed hyperlipidemia 07/31/2014  ° Morbid obesity (HCC) 07/31/2014  ° Obstructive sleep apnea syndrome 07/31/2014  ° Peripheral nerve disease 07/31/2014  ° Urinary incontinence 07/31/2014  ° History of adenomatous polyp of colon 02/28/2014  ° ° °Conditions to be addressed/monitored: Anxiety and Depression ° °Care Plan : LCSW Plan of Care  °Updates made by Lewis, Jasmine D, LCSW since 02/25/2021 12:00 AM  °  ° °Problem: Coping Skills (General Plan of Care)   °  ° °Long-Range Goal: Coping Skills Enhanced   °Start Date: 02/24/2021  °This Visit's Progress: On track  °Priority: High  °Note:   °Current barriers:   °Chronic Mental Health needs related to Depression and Anxiety °Limited social support and Family and  relationship dysfunction °Needs Support, Education, and Care Coordination in order to meet unmet mental health needs. °Clinical Goal(s): demonstrate a reduction in symptoms related to :Depression: anxiety   °Clinical Interventions:  °Assessed patient's previous and current treatment, coping skills, support system and barriers to care  °Patient reports difficulty managing mental heath conditions triggered by caregiver stress and limited boundaries with family. Patient recently returned from [place] where she was caring for father °Patient has recently moved to a home alone until adult daughter returns 05/2021 °Patient reports difficulty obtaining sleep. Strategies to improve sleep hygiene discussed °Patient denies SI/HI. Protective factors identified to assist with motivation to utilize strategies to manage health conditions °Patient reports compliance with medication management and was successful in identifying healthy coping skills  °Mindfulness or Relaxation training provided °Active listening / Reflection utilized  °Emotional Support Provided °Provided psychoeducation for mental health needs  °Reviewed mental health medications with patient and discussed importance of compliance:  °Quality of sleep assessed & Sleep Hygiene techniques promoted  °Caregiver stress acknowledged  °Participation in counseling encouraged  °Participation in support group encouraged  °Verbalization of feelings encouraged  ; °Review resources and discussed options for any psychosocial needs. None identified at visit °1:1 collaboration with primary care provider regarding development and update of comprehensive plan of care as evidenced by provider attestation and co-signature °Inter-disciplinary care team   collaboration (see longitudinal plan of care) °Patient Goals/Self-Care Activities: Over the next 120 days °Attend scheduled appts with providers °Utilize stress management strategies discussed °Contact PCP office with any questions or  concerns °  °  °  ° °Jasmine Lewis, MSW, LCSW °[facility] Care Management °[place]   [facility] °[email or web address] °Phone [phone] °10:12 AM ° ° ° °

## 2021-02-25 NOTE — Patient Instructions (Signed)
Visit Information  Thank you for taking time to visit with me today. Please don't hesitate to contact me if I can be of assistance to you before our next scheduled telephone appointment.  Following are the goals we discussed today:  Patient Goals/Self-Care Activities: Over the next 120 days Attend scheduled appts with providers Utilize stress management strategies discussed Contact PCP office with any questions or concerns  Our next appointment is by telephone on 05/04/21 at 10:00 AM  Please call the care guide team at [phone] if you need to cancel or reschedule your appointment.   If you are experiencing a Mental Health or Behavioral Health Crisis or need someone to talk to, please call the Suicide and Crisis Lifeline: 988 call 911   Following is a copy of your full plan of care:  Care Plan : LCSW Plan of Care  Updates made by Bridgett Larsson, LCSW since 02/25/2021 12:00 AM     Problem: Coping Skills (General Plan of Care)      Long-Range Goal: Coping Skills Enhanced   Start Date: 02/24/2021  This Visit's Progress: On track  Priority: High  Note:   Current barriers:   Chronic Mental Health needs related to Depression and Anxiety Limited social support and Family and relationship dysfunction Needs Support, Education, and Care Coordination in order to meet unmet mental health needs. Clinical Goal(s): demonstrate a reduction in symptoms related to :Depression: anxiety   Clinical Interventions:  Assessed patient's previous and current treatment, coping skills, support system and barriers to care  Patient reports difficulty managing mental heath conditions triggered by caregiver stress and limited boundaries with family. Patient recently returned from [place] where she was caring for father Patient has recently moved to a home alone until adult daughter returns 05/2021 Patient reports difficulty obtaining sleep. Strategies to improve sleep hygiene discussed Patient denies SI/HI.  Protective factors identified to assist with motivation to utilize strategies to manage health conditions Patient reports compliance with medication management and was successful in identifying healthy coping skills  Mindfulness or Relaxation training provided Active listening / Reflection utilized  Emotional Support Provided Provided psychoeducation for mental health needs  Reviewed mental health medications with patient and discussed importance of compliance:  Quality of sleep assessed & Sleep Hygiene techniques promoted  Caregiver stress acknowledged  Participation in counseling encouraged  Participation in support group encouraged  Verbalization of feelings encouraged  ; Review resources and discussed options for any psychosocial needs. None identified at visit 1:1 collaboration with primary care provider regarding development and update of comprehensive plan of care as evidenced by provider attestation and co-signature Inter-disciplinary care team collaboration (see longitudinal plan of care) Patient Goals/Self-Care Activities: Over the next 120 days Attend scheduled appts with providers Utilize stress management strategies discussed Contact PCP office with any questions or concerns       Ms. Dauenhauer was given information about Care Management services by the embedded care coordination team including:  Care Management services include personalized support from designated clinical staff supervised by her physician, including individualized plan of care and coordination with other care providers 24/7 contact phone numbers for assistance for urgent and routine care needs. The patient may stop CCM services at any time (effective at the end of the month) by phone call to the office staff.  Patient agreed to services and verbal consent obtained.   Patient verbalizes understanding of instructions provided today and agrees to view in MyChart.   Jenel Lucks, MSW, LCSW [facility]  Care Management [place]   [facility] [email or web address] Phone [phone] 10:10 AM

## 2021-02-26 ENCOUNTER — Emergency Department
Admission: EM | Admit: 2021-02-26 | Discharge: 2021-02-26 | Disposition: A | Discharge status: Home / Self Care | Payer: Medicare Other | Attending: Emergency Medicine | Admitting: Emergency Medicine

## 2021-02-26 ENCOUNTER — Emergency Department: Payer: Medicare Other

## 2021-02-26 ENCOUNTER — Other Ambulatory Visit: Payer: Self-pay

## 2021-02-26 ENCOUNTER — Encounter: Payer: Self-pay | Admitting: Intensive Care

## 2021-02-26 DIAGNOSIS — R590 Localized enlarged lymph nodes: Secondary | ICD-10-CM | POA: Diagnosis not present

## 2021-02-26 DIAGNOSIS — Z950 Presence of cardiac pacemaker: Secondary | ICD-10-CM | POA: Insufficient documentation

## 2021-02-26 DIAGNOSIS — R600 Localized edema: Secondary | ICD-10-CM | POA: Insufficient documentation

## 2021-02-26 DIAGNOSIS — Z96653 Presence of artificial knee joint, bilateral: Secondary | ICD-10-CM | POA: Diagnosis not present

## 2021-02-26 DIAGNOSIS — Z79899 Other long term (current) drug therapy: Secondary | ICD-10-CM | POA: Diagnosis not present

## 2021-02-26 DIAGNOSIS — Z87891 Personal history of nicotine dependence: Secondary | ICD-10-CM | POA: Insufficient documentation

## 2021-02-26 DIAGNOSIS — I10 Essential (primary) hypertension: Secondary | ICD-10-CM | POA: Insufficient documentation

## 2021-02-26 DIAGNOSIS — Z7982 Long term (current) use of aspirin: Secondary | ICD-10-CM | POA: Diagnosis not present

## 2021-02-26 DIAGNOSIS — R2242 Localized swelling, mass and lump, left lower limb: Secondary | ICD-10-CM | POA: Diagnosis present

## 2021-02-26 DIAGNOSIS — L03116 Cellulitis of left lower limb: Secondary | ICD-10-CM | POA: Insufficient documentation

## 2021-02-26 DIAGNOSIS — E119 Type 2 diabetes mellitus without complications: Secondary | ICD-10-CM | POA: Insufficient documentation

## 2021-02-26 DIAGNOSIS — M7989 Other specified soft tissue disorders: Secondary | ICD-10-CM | POA: Diagnosis not present

## 2021-02-26 LAB — PROTIME-INR
INR: 0.9 (ref 0.8–1.2)
Prothrombin Time: 12.6 seconds (ref 11.4–15.2)

## 2021-02-26 LAB — CBC WITH DIFFERENTIAL/PLATELET
Abs Immature Granulocytes: 0.03 10*3/uL (ref 0.00–0.07)
Basophils Absolute: 0 10*3/uL (ref 0.0–0.1)
Basophils Relative: 0 %
Eosinophils Absolute: 0.3 10*3/uL (ref 0.0–0.5)
Eosinophils Relative: 4 %
HCT: 38.7 % (ref 36.0–46.0)
Hemoglobin: 12.1 g/dL (ref 12.0–15.0)
Immature Granulocytes: 1 %
Lymphocytes Relative: 31 %
Lymphs Abs: 2 10*3/uL (ref 0.7–4.0)
MCH: 29.3 pg (ref 26.0–34.0)
MCHC: 31.3 g/dL (ref 30.0–36.0)
MCV: 93.7 fL (ref 80.0–100.0)
Monocytes Absolute: 0.9 10*3/uL (ref 0.1–1.0)
Monocytes Relative: 13 %
Neutro Abs: 3.4 10*3/uL (ref 1.7–7.7)
Neutrophils Relative %: 51 %
Platelets: 245 10*3/uL (ref 150–400)
RBC: 4.13 MIL/uL (ref 3.87–5.11)
RDW: 14.7 % (ref 11.5–15.5)
WBC: 6.6 10*3/uL (ref 4.0–10.5)
nRBC: 0 % (ref 0.0–0.2)

## 2021-02-26 LAB — BRAIN NATRIURETIC PEPTIDE: B Natriuretic Peptide: 17.4 pg/mL (ref 0.0–100.0)

## 2021-02-26 MED ORDER — CEPHALEXIN 500 MG PO CAPS
500.0000 mg | ORAL_CAPSULE | Freq: Once | ORAL | Status: AC
  Administered 2021-02-26: 16:00:00 500 mg via ORAL
  Filled 2021-02-26: qty 1

## 2021-02-26 MED ORDER — CEPHALEXIN 500 MG PO CAPS: 500.0000 mg | ORAL_CAPSULE | Freq: Four times a day (QID) | ORAL | 0 refills | Status: AC

## 2021-02-26 NOTE — ED Provider Notes (Signed)
[facility]  ____________________________________________   Event Date/Time   First MD Initiated Contact with Patient 02/26/21 1446     (approximate)  I have reviewed the triage vital signs and the nursing notes.   HISTORY  Chief Complaint Leg Swelling    HPI Janet [name] is a 70 y.o. female past medical history of peripheral artery disease, hypertension, hyperlipidemia who presents with left lower extremity pain swelling and redness.  About 6 days ago patient had itching on the left lower extremity and then there was some clear serous drainage.  Then over the last 3 days she has noticed increasing swelling and redness.  She denies any shortness of breath fevers chills.  Otherwise is feeling well.  Patient has history of chronic venous insufficiency and swelling of bilateral lower extremities         Past Medical History:  Diagnosis Date   GERD (gastroesophageal reflux disease)    Heart murmur    Hyperlipidemia    Hypertension    PAD (peripheral artery disease) (HCC)    Restless leg    Sleep apnea    Urinary incontinence     Patient Active Problem List   Diagnosis Date Noted   Chronic left-sided low back pain with left-sided sciatica 04/14/2020   History of deep vein thrombosis 09/16/2019   Type 2 diabetes mellitus with other specified complication (HCC) 08/20/2019   Restless leg syndrome 08/20/2019   Major depression, recurrent, full remission (HCC) 08/20/2019   Other insomnia 08/20/2019   Symptomatic varicose veins, bilateral 08/20/2019   Edema of left lower extremity 08/20/2019   Primary osteoarthritis involving multiple joints 08/20/2019   Venous insufficiency of both lower extremities 08/20/2019   History of bariatric surgery 08/20/2019   Cardiac pacemaker 08/20/2019   Anxiety disorder 10/31/2018   Edema 10/31/2018   Heart murmur 10/31/2018   Osteoporosis 10/31/2018   Primary osteoarthritis of left knee 10/19/2018   History of  pacemaker 09/12/2018   Shortness of breath 09/12/2018   Palpitations 06/20/2016   Postmenopausal bleeding 08/04/2015   Lymphedema 12/16/2014   Osteoarthritis 07/31/2014   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   Gastroesophageal reflux disease 07/31/2014   Mixed hyperlipidemia 07/31/2014   Morbid obesity (HCC) 07/31/2014   Obstructive sleep apnea syndrome 07/31/2014   Peripheral nerve disease 07/31/2014   Urinary incontinence 07/31/2014   History of adenomatous polyp of colon 02/28/2014    Past Surgical History:  Procedure Laterality Date   APPENDECTOMY  2014   CARPAL TUNNEL RELEASE Left 1998   DG C-ARM 2 VIEW LEFT SHOULDER ([facility] HX)     DG C-ARM 2 VIEW RIGHT SHOULDER ([facility] HX)     ESOPHAGEAL DILATION  2012   2015, 2017, 2020   GASTRIC BYPASS  2019   HERNIA REPAIR  2019   left knee manipulation Left 02/05/2019   PACEMAKER IMPLANT  2017   POLYPECTOMY     SHOULDER SURGERY Left 2014   SHOULDER SURGERY Right 2017   TOTAL KNEE ARTHROPLASTY Right 2018   TOTAL KNEE ARTHROPLASTY Left 10/18/2018   vein removal Bilateral 2012    Prior to Admission medications   Medication Sig Start Date End Date Taking? Authorizing Provider  cephALEXin (KEFLEX) 500 MG capsule Take 1 capsule (500 mg total) by mouth 4 (four) times daily for 7 days. 02/26/21 03/05/21 Yes Georga Hacking, MD  aspirin 81 MG EC tablet Take 81 mg by mouth daily.    [provider]  atorvastatin (LIPITOR) 40 MG tablet Take 40 mg by  mouth at bedtime. 03/20/19   [provider]  baclofen (LIORESAL) 10 MG tablet Take by mouth. Patient not taking: No sig reported    [provider]  DULoxetine (CYMBALTA) 30 MG capsule Take 1 capsule (30 mg total) by mouth daily. 03/03/20   Karamalegos, Netta Neat, DO  gabapentin (NEURONTIN) 300 MG capsule Take 300 mg by mouth 3 (three) times daily. 09/25/20   [provider]  losartan-hydrochlorothiazide (HYZAAR) 100-25 MG tablet Take 1 tablet by mouth daily. 03/03/20    Karamalegos, Netta Neat, DO  meloxicam (MOBIC) 15 MG tablet Take 15 mg by mouth daily as needed.    [provider]  pramipexole (MIRAPEX) 1 MG tablet Take 1 mg by mouth 2 (two) times daily. 10/07/20   [provider]    Allergies Patient has no known allergies.  Family History  Problem Relation Age of Onset   Heart attack Mother 89   Heart disease Father    Thyroid disease Father    Skin cancer Father    Brain cancer Maternal Grandmother     Social History Social History   Tobacco Use   Smoking status: Former    Packs/day: 1.00    Years: 25.00    Pack years: 25.00    Types: Cigarettes   Smokeless tobacco: Former  Building services engineer Use: Never used  Substance Use Topics   Alcohol use: Never   Drug use: Never    Review of Systems   Review of Systems  Constitutional:  Negative for chills and fever.  Respiratory:  Negative for shortness of breath.   Cardiovascular:  Positive for leg swelling.  Skin:  Positive for color change and rash.  All other systems reviewed and are negative.  Physical Exam Updated Vital Signs BP (!) 162/93 (BP Location: Left Arm)    Pulse 83    Temp 98.2 F (36.8 C) (Oral)    Resp 18    Ht 5\' 2"  (1.575 m)    Wt 90.7 kg    SpO2 100%    BMI 36.58 kg/m   Physical Exam Vitals and nursing note reviewed.  Constitutional:      General: She is not in acute distress.    Appearance: Normal appearance.  HENT:     Head: Normocephalic and atraumatic.  Eyes:     General: No scleral icterus.    Conjunctiva/sclera: Conjunctivae normal.  Pulmonary:     Effort: Pulmonary effort is normal. No respiratory distress.     Breath sounds: No stridor.  Musculoskeletal:        General: Swelling and tenderness present. No deformity or signs of injury.     Cervical back: Normal range of motion.     Comments: Left lower extremity with significant swelling from the ankle to the mid calf, there is erythema with warmth, no crepitus, minimal  tenderness, compartments are soft, no open wound or drainage  2+ DP pulse bilaterally  RLE with 1+ edema, no obvious skin changes   Skin:    General: Skin is dry.     Coloration: Skin is not jaundiced or pale.  Neurological:     General: No focal deficit present.     Mental Status: She is alert and oriented to person, place, and time. Mental status is at baseline.  Psychiatric:        Mood and Affect: Mood normal.        Behavior: Behavior normal.     LABS (all labs ordered are  listed, but only abnormal results are displayed)  Labs Reviewed  BRAIN NATRIURETIC PEPTIDE  CBC WITH DIFFERENTIAL/PLATELET  PROTIME-INR   ____________________________________________  EKG   ____________________________________________  RADIOLOGY Ky Barban, personally viewed and evaluated these images (plain radiographs) as part of my medical decision making, as well as reviewing the written report by the radiologist.  ED MD interpretation:      ____________________________________________   PROCEDURES  Procedure(s) performed (including Critical Care):  Procedures   ____________________________________________   INITIAL IMPRESSION / ASSESSMENT AND PLAN / ED COURSE     70 year old female chronic venous insufficiency presents with worsening left lower extremity swelling and redness.  Seem to start after she had some intense itching with clear drainage now has progressive swelling redness.  No systemic symptoms.  She is afebrile, vital signs overall reassuring.  Her labs are also reassuring, no leukocytosis.  DVT study was obtained which shows significant lymphadenopathy but no DVT.  Due to significant edema evaluation of the calf veins is limited.  On exam she has erythema and warmth of the left lower extremity but no signs suspicious for necrotizing infection or compartment syndrome.  She has good peripheral pulses.  Suspect cellulitis.  Will treat with Keflex.  Advised that she  follow-up with her primary care provider as she would likely need to have a repeat DVT study given the limited evaluation of the calf veins if her swelling does not improve.  We also discussed return precautions.  She is stable for discharge.      ____________________________________________   FINAL CLINICAL IMPRESSION(S) / ED DIAGNOSES  Final diagnoses:  Cellulitis of left lower extremity     ED Discharge Orders          Ordered    cephALEXin (KEFLEX) 500 MG capsule  4 times daily        02/26/21 1533             Note:  This document was prepared using Dragon voice recognition software and may include unintentional dictation errors.    Georga Hacking, MD 02/26/21 [id number]

## 2021-02-26 NOTE — ED Triage Notes (Signed)
Patient presents with left leg stinging/pain/redness/hot to touch/swelling. Symptoms started X3 days ago and progressively gotten worse. Patient reports intermittent clear drainage.

## 2021-02-26 NOTE — Discharge Instructions (Addendum)
You have cellulitis of your left leg.  Please take the antibiotics 4 times daily for the next 5 days.  Please return to the emergency department if the redness is extending up the leg despite being on antibiotics or you develop fevers or worsening pain.  Please follow-up with your primary care provider in about a week as you may need to have a repeat ultrasound to make sure there is no underlying blood clot.

## 2021-02-26 NOTE — ED Provider Notes (Signed)
Emergency Medicine Provider Triage Evaluation Note  Janet [name] , a 70 y.o. female  was evaluated in triage.  Pt complains of left pain, redness and swelling x 3 days. No known fever.   Review of Systems  Positive: Left leg pain and swelling  Negative: Fever  Physical Exam  There were no vitals taken for this visit. Gen:   Awake, no distress   Resp:  Normal effort  MSK:   Moves extremities without difficulty  Other:    Medical Decision Making  Medically screening exam initiated at 11:53 AM.  Appropriate orders placed.  Janet [name] was informed that the remainder of the evaluation will be completed by another provider, this initial triage assessment does not replace that evaluation, and the importance of remaining in the ED until their evaluation is complete.    Chinita Pester, FNP 02/26/21 1156    Minna Antis, MD 02/26/21 1204

## 2021-02-27 DIAGNOSIS — E1169 Type 2 diabetes mellitus with other specified complication: Secondary | ICD-10-CM

## 2021-02-27 DIAGNOSIS — M159 Polyosteoarthritis, unspecified: Secondary | ICD-10-CM

## 2021-02-27 DIAGNOSIS — F3342 Major depressive disorder, recurrent, in full remission: Secondary | ICD-10-CM

## 2021-03-01 DIAGNOSIS — L03116 Cellulitis of left lower limb: Secondary | ICD-10-CM | POA: Diagnosis not present

## 2021-03-03 ENCOUNTER — Telehealth: Payer: Self-pay | Admitting: Family Medicine

## 2021-03-03 ENCOUNTER — Ambulatory Visit (INDEPENDENT_AMBULATORY_CARE_PROVIDER_SITE_OTHER): Payer: Medicare Other | Admitting: Pharmacist

## 2021-03-03 ENCOUNTER — Ambulatory Visit: Payer: Self-pay | Admitting: *Deleted

## 2021-03-03 DIAGNOSIS — I10 Essential (primary) hypertension: Secondary | ICD-10-CM

## 2021-03-03 NOTE — Telephone Encounter (Signed)
Duplicate message. 

## 2021-03-03 NOTE — Chronic Care Management (AMB) (Signed)
Chronic Care Management CCM Pharmacy Note  03/03/2021 Name:  Janet [name] MRN:  [id number] DOB:  03-06-1950   Subjective: Janet [name] is an 71 y.o. year old female who is a primary patient of Olin Hauser, DO.  The CCM team was consulted for assistance with disease management and care coordination needs.    Engaged with patient by telephone for follow up visit for pharmacy case management and/or care coordination services.   Objective:  Medications Reviewed Today     Reviewed by Vanita Ingles, RN (Case Manager) on 01/25/21 at 67  Med List Status: <None>   Medication Order Taking? Sig Documenting Provider Last Dose Status Informant  aspirin 81 MG EC tablet [id number] No Take 81 mg by mouth daily. [provider] Taking Active   atorvastatin (LIPITOR) 40 MG tablet [id number] No Take 40 mg by mouth at bedtime. [provider] Taking Active   baclofen (LIORESAL) 10 MG tablet [id number] No Take by mouth.  Patient not taking: No sig reported   [provider] Not Taking Active   DULoxetine (CYMBALTA) 30 MG capsule [id number] No Take 1 capsule (30 mg total) by mouth daily. Olin Hauser, DO Taking Active   gabapentin (NEURONTIN) 300 MG capsule [id number] No Take 300 mg by mouth 3 (three) times daily. [provider] Taking Active            Med Note Curley Spice, [facility]   Fri Dec 04, 2020  9:37 AM) Taking 1 capsule (300 mg) in the morning and 2 capsules  (600 mg) at night as directed by Neurologist.  losartan-hydrochlorothiazide (HYZAAR) 100-25 MG tablet [id number] No Take 1 tablet by mouth daily. Olin Hauser, DO Taking Active   meloxicam (MOBIC) 15 MG tablet [id number] No Take 15 mg by mouth daily as needed. [provider] Taking Active   pramipexole (MIRAPEX) 1 MG tablet [id number] No Take 1 mg by mouth 2 (two) times daily. [provider] Taking Active             Pertinent Labs:   Lab Results   Component Value Date   CHOL 133 09/09/2020   HDL 52 09/09/2020   LDLCALC 67 09/09/2020   TRIG 56 09/09/2020   CHOLHDL 2.6 09/09/2020   Lab Results  Component Value Date   CREATININE 0.71 09/09/2020   BUN 26 (H) 09/09/2020   NA 142 09/09/2020   K 4.4 09/09/2020   CL 105 09/09/2020   CO2 29 09/09/2020    SDOH:  (Social Determinants of Health) assessments and interventions performed:    [place]  Review of patient past medical history, allergies, medications, health status, including review of consultants reports, laboratory and other test data, was performed as part of comprehensive evaluation and provision of chronic care management services.   Care Plan : PharmD - Medication Adherence  Updates made by Rennis Petty, RPH-CPP since 03/03/2021 12:00 AM     Problem: Disease Progression      Long-Range Goal: Disease Progression Prevented or Minimized   Start Date: 11/27/2020  Expected End Date: 02/25/2021  Recent Progress: On track  Priority: High  Note:   Current Barriers:  Unable to self administer medications as prescribed  Pharmacist Clinical Goal(s):  Over the next 90 days, patient will achieve adherence to monitoring guidelines and medication adherence to achieve therapeutic efficacy through collaboration with PharmD and provider.    Interventions: 1:1 collaboration with Olin Hauser, DO regarding development and update of comprehensive  plan of care as evidenced by provider attestation and co-signature Inter-disciplinary care team collaboration (see longitudinal plan of care) Perform chart review.  Patient seen in [facility] ED on 12/30 for leg swelling/cellulitis of left lower extremity. ED Provider advised patient to: START cephalexin 500 mg four times daily for 7 days Follow-up with her primary care provider as she would likely need to have a repeat DVT study given the limited evaluation of the calf veins if her swelling does not  improve Office Visit with [facility] on 03/01/2021 for cellulitis of left lower leg. Provider advised patient to start Bactrim DS (800-160 MG twice daily for 10 days) course in addition to cephalexin course Today patient reports that she has been unable to continue Bactrim as vomited with each dose tried. Reports continuing to take cephalexin course four times daily as directed, but that her leg has not improved. Denies missed doses. Reports she could not sleep last night because of the pain. Advise patient to call PCP office today regarding leg symptoms and provide phone number. Patient states will call now.  Medication adherence: Reports recently stopped using weekly pillbox and twice daily phone reminder alarms.  Reports taking cephalexin doses consistently, but denies adherence to her other medications this week Encourage patient to restart using weekly pillbox today  Hypertension: Denies checking home blood pressure recently Encourage patient to restart monitoring home BP and HR 1-2 times week and to keep a log of the results  Type 2 Diabetes: Controlled: current treatment: none  Hyperlipidemia: Controlled: current treatment: atorvastatin 40 mg daily  Patient Goals/Self-Care Activities Over the next 90 days, patient will:  - focus on medication adherence by:   Using weekly pillbox   Using medication reminder alarms on phone for 8 am and 9 pm  Follow Up Plan: The care management team will reach out to the patient again over the next 14 days.         Wallace Cullens, PharmD, [place], CPP Clinical Pharmacist [facility] [phone]

## 2021-03-03 NOTE — Patient Instructions (Signed)
Visit Information  Thank you for taking time to visit with me today. Please don't hesitate to contact me if I can be of assistance to you before our next scheduled telephone appointment.  Following are the goals we discussed today:   Goals Addressed             This Visit's Progress    Pharmacy Goals       Please check your home blood pressure, keep a log of the results and bring this with you to your medical appointments.  Feel free to call me with any questions or concerns. I look forward to our next call!  Estelle Grumbles, PharmD, [place]y [place], CPP Clinical Pharmacist [facility] [phone]         The care management team will reach out to the patient again over the next 14 days.    Please call the care guide team at [phone] if you need to cancel or reschedule your appointment.    The patient verbalized understanding of instructions, educational materials, and care plan provided today and declined offer to receive copy of patient instructions, educational materials, and care plan.

## 2021-03-03 NOTE — Telephone Encounter (Signed)
Patient seen in [facility] ED on 12/30 for cellulitis and swelling with pain of leg.  She was placed on Keflex antibiotic.  Symptoms did not resolve. Urgent Care on 03/01/21 gave her Bactrim, this has not helped and she is having nausea vomiting cannot keep antibiotic down.  Please contact patient today and review her current course.  I am worried about this and at this time would actually recommend return to ED - because she is failing oral antibiotics based on this history and if she cannot keep oral antibiotics down and they are not helping over course of 1 week, she may need IV antibiotic treatment for cellulitis.  She is asking about pain medication. Unfortunately this is not an appropriate scenario to treat pain without evaluating her and I am worried if she is in that much pain, nausea vomiting, antibiotics not working - there is not much else I can do for her right now in office.  Saralyn Pilar, DO [facility] 03/03/2021, 11:54 AM

## 2021-03-03 NOTE — Telephone Encounter (Signed)
°  Chief Complaint: worsening pain and swelling left leg due to cellulitis , antibiotic causes vomiting ,requesting pain medication  Symptoms: left leg redness better, swelling now into thigh and pain worse. Vomiting due to antibiotic bactrim  Frequency: 02/26/21 Pertinent Negatives: Patient denies fever Disposition: [] ED /[x] Urgent Care (no appt availability in office) / [x] Appointment(In office/virtual)/ []  [facility]/ [] Home Care/ [] Refused Recommended Disposition /[] [facility]/ []  Follow-up with PCP Additional Notes:  Please contact patient if able to be seen today . Last seen in ED 02/26/21 and UC 03/02/21. Scheduled f/u appt 03/08/21 for antibiotic use. Patient would like a call back           Reason for Disposition  [1] Taking antibiotic > 24 hours AND [2] cellulitis symptoms are WORSE (e.g., spreading redness, pain, swelling)  Answer Assessment - Initial Assessment Questions 1. SYMPTOM: "What's the main symptom you're concerned about?" (e.g., redness, swelling, pain, fever, weakness)     Pain , swelling up to thigh , decrease redness  2. CELLULITIS LOCATION: "Where is the cellulitis located?" (e.g., hand, arm, foot, leg, face)     Left leg  3. CELLULITIS SIZE: "What is the size of the red area?" (e.g., inches, centimeters; compare to size of a coin) .     Ankle to knee  4. BETTER-SAME-WORSE: "Are you getting better, staying the same, or getting worse compared to the day you started the antibiotics?"      Same  5. PAIN: Do you have any pain?"  If Yes, ask: "How bad is the pain?"  (e.g., Scale 1-10; mild, moderate, or severe)    - MILD (1-3): doesn't interfere with normal activities     - MODERATE (4-7): interferes with normal activities or awakens from sleep    - SEVERE (8-10): excruciating pain, unable to do any normal activities       Severe  6. FEVER: "Do you have a fever?" If Yes, ask: "What is it, how was it measured and when did it start?"     No   7. OTHER SYMPTOMS: "Do you have any other symptoms?" (e.g., pus coming from a wound, red streaks, weakness)     No  8. DIAGNOSIS DATE: "When was the cellulitis diagnosed?" "By whom?"      02/26/21 ED  9. ANTIBIOTIC NAME: "What antibiotic(s) are you taking?"  "How many times per day?" (Be sure the patient is receiving the antibiotic as directed).      Cephalexin ,bactrim  10. ANTIBIOTIC DATE: "When was the antibiotic started?"       02/26/21 cephalexin and started bactrim 03/02/21 and vomiting  11. FOLLOW-UP APPOINTMENT: "Do you have follow-up appointment with your doctor?"       no  Protocols used: Cellulitis on Antibiotic Follow-up Call-A-AH

## 2021-03-04 DIAGNOSIS — R29898 Other symptoms and signs involving the musculoskeletal system: Secondary | ICD-10-CM | POA: Diagnosis not present

## 2021-03-04 DIAGNOSIS — M48061 Spinal stenosis, lumbar region without neurogenic claudication: Secondary | ICD-10-CM | POA: Diagnosis not present

## 2021-03-04 DIAGNOSIS — G629 Polyneuropathy, unspecified: Secondary | ICD-10-CM | POA: Diagnosis not present

## 2021-03-04 DIAGNOSIS — G2581 Restless legs syndrome: Secondary | ICD-10-CM | POA: Diagnosis not present

## 2021-03-04 DIAGNOSIS — M79641 Pain in right hand: Secondary | ICD-10-CM | POA: Diagnosis not present

## 2021-03-04 DIAGNOSIS — M79642 Pain in left hand: Secondary | ICD-10-CM | POA: Diagnosis not present

## 2021-03-08 ENCOUNTER — Other Ambulatory Visit: Payer: Self-pay | Admitting: Family Medicine

## 2021-03-08 ENCOUNTER — Ambulatory Visit: Payer: Self-pay

## 2021-03-08 ENCOUNTER — Ambulatory Visit (INDEPENDENT_AMBULATORY_CARE_PROVIDER_SITE_OTHER): Payer: Medicare Other | Admitting: Family Medicine

## 2021-03-08 ENCOUNTER — Other Ambulatory Visit: Payer: Self-pay

## 2021-03-08 ENCOUNTER — Encounter: Payer: Self-pay | Admitting: Family Medicine

## 2021-03-08 VITALS — BP 157/85 | HR 80 | Ht 62.0 in | Wt 218.0 lb

## 2021-03-08 DIAGNOSIS — I83893 Varicose veins of bilateral lower extremities with other complications: Secondary | ICD-10-CM

## 2021-03-08 DIAGNOSIS — F411 Generalized anxiety disorder: Secondary | ICD-10-CM

## 2021-03-08 DIAGNOSIS — I10 Essential (primary) hypertension: Secondary | ICD-10-CM

## 2021-03-08 DIAGNOSIS — I872 Venous insufficiency (chronic) (peripheral): Secondary | ICD-10-CM

## 2021-03-08 DIAGNOSIS — M1712 Unilateral primary osteoarthritis, left knee: Secondary | ICD-10-CM

## 2021-03-08 DIAGNOSIS — E1169 Type 2 diabetes mellitus with other specified complication: Secondary | ICD-10-CM

## 2021-03-08 DIAGNOSIS — F331 Major depressive disorder, recurrent, moderate: Secondary | ICD-10-CM

## 2021-03-08 DIAGNOSIS — G2581 Restless legs syndrome: Secondary | ICD-10-CM

## 2021-03-08 DIAGNOSIS — F3342 Major depressive disorder, recurrent, in full remission: Secondary | ICD-10-CM

## 2021-03-08 DIAGNOSIS — G8929 Other chronic pain: Secondary | ICD-10-CM

## 2021-03-08 DIAGNOSIS — Z0000 Encounter for general adult medical examination without abnormal findings: Secondary | ICD-10-CM

## 2021-03-08 DIAGNOSIS — E785 Hyperlipidemia, unspecified: Secondary | ICD-10-CM

## 2021-03-08 DIAGNOSIS — Z9181 History of falling: Secondary | ICD-10-CM

## 2021-03-08 MED ORDER — PRAMIPEXOLE DIHYDROCHLORIDE 1 MG PO TABS: 1.0000 mg | ORAL_TABLET | Freq: Two times a day (BID) | ORAL | 3 refills | Status: DC

## 2021-03-08 MED ORDER — BACLOFEN 10 MG PO TABS: 10.0000 mg | ORAL_TABLET | Freq: Every evening | ORAL | 2 refills | Status: DC | PRN

## 2021-03-08 NOTE — Patient Instructions (Addendum)
Thank you for coming to the office today.  Last visit with Vascular was referred in 07/2019. You saw Eulogio Ditch NP in July 2021.  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  Use RICE therapy: - R - Rest / relative rest with activity modification avoid overuse of joint - I - Ice packs (make sure you use a towel or sock / something to protect skin) - C - Compression with socks / ACE wrap to apply pressure and reduce swelling allowing more support - E - Elevation - if significant swelling, lift leg above heart level (toes above your nose) to help reduce swelling, most helpful at night after day of being on your feet  Refilled Baclofen and RLS medication.  DUE for FASTING BLOOD WORK (no food or drink after midnight before the lab appointment, only water or coffee without cream/sugar on the morning of)  SCHEDULE "Lab Only" visit in the morning at the clinic for lab draw in 6 MONTHS   - Make sure Lab Only appointment is at about 1 week before your next appointment, so that results will be available  For Lab Results, once available within 2-3 days of blood draw, you can can log in to MyChart online to view your results and a brief explanation. Also, we can discuss results at next follow-up visit.    Please schedule a Follow-up Appointment to: Return in about 6 months (around 09/05/2021) for 6 month fasting lab only then 1 week later Annual Physical.  If you have any other questions or concerns, please feel free to call the office or send a message through [facility]. You may also schedule an earlier appointment if necessary.  Additionally, you may be receiving a survey about your experience at our office within a few days to 1 week by e-mail or mail. We value your feedback.  Nobie Putnam, DO [place]

## 2021-03-08 NOTE — Chronic Care Management (AMB) (Signed)
Chronic Care Management   CCM RN Visit Note  03/08/2021 Name: Janet [name] MRN: [id number] DOB: 01-Jul-1950  Subjective: Janet [name] is a 71 y.o. year old female who is a primary care patient of Olin Hauser, DO. The care management team was consulted for assistance with disease management and care coordination needs.    Engaged with patient face to face for follow up visit in response to provider referral for case management and/or care coordination services.   Consent to Services:  The patient was given information about Chronic Care Management services, agreed to services, and gave verbal consent prior to initiation of services.  Please see initial visit note for detailed documentation.   Patient agreed to services and verbal consent obtained.   Assessment: Review of patient past medical history, allergies, medications, health status, including review of consultants reports, laboratory and other test data, was performed as part of comprehensive evaluation and provision of chronic care management services.   SDOH (Social Determinants of Health) assessments and interventions performed:    CCM Care Plan  No Known Allergies  Outpatient Encounter Medications as of 03/08/2021  Medication Sig Note   aspirin 81 MG EC tablet Take 81 mg by mouth daily.    atorvastatin (LIPITOR) 40 MG tablet Take 40 mg by mouth at bedtime.    baclofen (LIORESAL) 10 MG tablet Take 1 tablet (10 mg total) by mouth at bedtime as needed for muscle spasms.    DULoxetine (CYMBALTA) 30 MG capsule Take 1 capsule (30 mg total) by mouth daily.    gabapentin (NEURONTIN) 300 MG capsule Take 300 mg by mouth 3 (three) times daily. 12/04/2020: Taking 1 capsule (300 mg) in the morning and 2 capsules  (600 mg) at night as directed by Neurologist.   losartan-hydrochlorothiazide (HYZAAR) 100-25 MG tablet Take 1 tablet by mouth daily.    meloxicam (MOBIC) 15 MG tablet Take 15 mg by mouth daily as needed.    pramipexole  (MIRAPEX) 1 MG tablet Take 1 tablet (1 mg total) by mouth 2 (two) times daily.    [DISCONTINUED] baclofen (LIORESAL) 10 MG tablet Take by mouth. (Patient not taking: Reported on 11/27/2020)    [DISCONTINUED] pramipexole (MIRAPEX) 1 MG tablet Take 1 mg by mouth 2 (two) times daily.    No facility-administered encounter medications on file as of 03/08/2021.    Patient Active Problem List   Diagnosis Date Noted   Chronic left-sided low back pain with left-sided sciatica 04/14/2020   History of deep vein thrombosis 09/16/2019   Type 2 diabetes mellitus with other specified complication ([place]) 51/03/5850   Restless leg syndrome 08/20/2019   Major depression, recurrent, full remission ([place]) 08/20/2019   Other insomnia 08/20/2019   Symptomatic varicose veins, bilateral 08/20/2019   Edema of left lower extremity 08/20/2019   Primary osteoarthritis involving multiple joints 08/20/2019   Venous insufficiency of both lower extremities 08/20/2019   History of bariatric surgery 08/20/2019   Cardiac pacemaker 08/20/2019   Anxiety disorder 10/31/2018   Edema 10/31/2018   Heart murmur 10/31/2018   Osteoporosis 10/31/2018   Primary osteoarthritis of left knee 10/19/2018   History of pacemaker 09/12/2018   Shortness of breath 09/12/2018   Palpitations 06/20/2016   Postmenopausal bleeding 08/04/2015   Lymphedema 12/16/2014   Osteoarthritis 07/31/2014   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   Gastroesophageal reflux disease 07/31/2014   Mixed hyperlipidemia 07/31/2014   Morbid obesity ([place]) 07/31/2014   Obstructive sleep apnea syndrome 07/31/2014   Peripheral nerve disease 07/31/2014   Urinary incontinence  07/31/2014   History of adenomatous polyp of colon 02/28/2014    Conditions to be addressed/monitored:HTN, HLD, DMII, Anxiety, Depression, and falls, chronic pain and left lower leg cellulitis  Care Plan : RNCM: Adult plan of care for  Chronic Disease Management and Care Coordination Needs  Updates made by  Vanita Ingles, RN since 03/08/2021 12:00 AM     Problem: RNCM: Adult plan of care for Chronic Disease Management and Care Coordination Needs   Priority: High  Onset Date: 11/16/2020     Long-Range Goal: RNCM: Adult Plan of Care for Chronic Disease Management and Care Coordination Needs   Start Date: 11/16/2020  Expected End Date: 11/16/2021  Recent Progress: On track  Priority: High  Note:   Current Barriers:  Knowledge Deficits related to plan of care for management of HTN, HLD, DMII, Depression, Anxiety with family circumstances and not caring for self, Depression: depressed mood, and chronic pain (RLS, OA, Chronic back pain),l and falls and safety concerns insomnia, fatigue, anxiety, disturbed sleep,, Caregiver Stress, Grief, Insomnia/Sleep Difficulties, and Stress at family circumstances, and grief process over the loss of her oldest daughter to [place] in October 2021  Care Coordination needs related to Limited social support, Level of care concerns, Mental Health Concerns , and Family and relationship dysfunction  Chronic Disease Management support and education needs related to HTN,  HLD, DMII, Depression, Anxiety with Social Anxiety,, Depression: depressed mood, falls prevention and safety and chronic pain  insomnia, fatigue, anxiety,, Caregiver Stress, Grief, Mood Instability, and Stress at family situations and circumstances, and grief at the loss of her oldest daughter last year in October of 2021 Lacks caregiver support.  Non-adherence to prescribed medication regimen 03-08-2021: New onset of cellulitis to left leg first seen in ER on 02-26-2021, follow up with pcp on 03-08-2021  RNCM Clinical Goal(s):  Patient will verbalize understanding of plan for management of HTN, HLD, DMII, Anxiety, Depression,  falls prevention, and Chronic pain  verbalize basic understanding of HLD, DMII, Anxiety, Depression, falls prevention, and Chronic pain  disease process and self health management plan   take all medications exactly as prescribed and will call provider for medication related questions demonstrate understanding of rationale for each prescribed medication patient desires to work with pharm D for effective management of chronic conditions and how to take her medications as prescribed demonstrate improved and ongoing adherence to prescribed treatment plan for HTN, HLD, DMII, Anxiety, Depression, falls prevention, and chronic pain  as evidenced by adherence to ADA/ carb modified diet adherence to prescribed medication regimen contacting provider for new or worsened symptoms or questions and working with the CCM team for effective management of chronic diseases  demonstrate improved and ongoing health management independence by remaining safe in home environment and effective management of Chronic conditions  continue to work with Consulting civil engineer to address care management and care coordination needs related to HLD, DMII, Anxiety, Depression, and chronic pain and fall prevention   work with pharmacist to address medication concerns as the patient admits she does not take her medications as prescribed and struggles with managing her medications related to HTN, HLD, DMII, Anxiety, Depression, and chronic pain and falls prevention and safety through collaboration with RN Care manager, provider, and care team.   Interventions: 1:1 collaboration with primary care provider regarding development and update of comprehensive plan of care as evidenced by provider attestation and co-signature Inter-disciplinary care team collaboration (see longitudinal plan of care) Evaluation of current treatment plan related to  self management and patient's adherence to plan as established by provider   SDOH Barriers (Status: Goal on track: YES.)  Patient interviewed and SDOH assessment performed        SDOH Interventions    Flowsheet Row Most Recent Value  SDOH Interventions   Food Insecurity Interventions  Intervention Not Indicated  Intimate Partner Violence Interventions Intervention Not Indicated  Physical Activity Interventions Other (Comments)  [no structured activities]  Social Connections Interventions Other (Comment)  [good support system, is moving soon]  Transportation Interventions Intervention Not Indicated     Patient interviewed and appropriate assessments performed Provided patient with information about CCM team and working with the patient to manage chronic conditions and effectively manage health and well being. The patient wants to be proactive in her care and is willing to work with the team Discussed plans with patient for ongoing care management follow up and provided patient with direct contact information for care management team Advised patient to call the office for changes in condition or SDOH needs. The patient states that she is hoping by working with the CCM team she can remember best how to take her medications and take care of herself beter.  Collaborated with RN Case Manager re: ongoing support and education for management of chronic conditions and health and well being goals. 03-08-2021: The patient has moved into a house wife her daughter in [place] and is doing very well. She had been out of town caring for her elderly father. She is doing well and in the office for a follow up today of left lower leg cellulitis    Diabetes:  (Status: Goal on track: YES.) Lab Results  Component Value Date   HGBA1C 5.7 (H) 09/09/2020   Assessed patient's understanding of A1c goal: <7% Provided education to patient about basic DM disease process; Reviewed prescribed diet with patient 11-16-2020: Review of heart healthy/ADA diet. The patient eats small meals and sometimes is not eating what she should. Sometimes eats a bedtime snack sometimes not. The patient encouraged to follow a heart healthy/ADA diet. Will send information by my chart system for review and help with meal  choices and options. 11-30-2020: Is eating well and denies any issues with compliance with heart healthy/ADA diet. 01-25-2021: Is in [place] taking care of her elderly father. She states she is actually doing very well right now. Denies any acute issues with management of DM or following dietary restrictions. 03-08-2021: The patient feels better and is happy that she is moved and into the new house with her daughter. The patient was in the office today for follow up of left lower leg cellulitis and states that her blood sugars are WNL and she is eating better ; Counseled on importance of regular laboratory monitoring as prescribed. 03-08-2021: The patient keeps appointments and has regular lab work.         Discussed plans with patient for ongoing care management follow up and provided patient with direct contact information for care management team;      Provided patient with written educational materials related to hypo and hyperglycemia and importance of correct treatment. 11-16-2020: Reviewed with the patient the sx and sx of hypo and hyperglycemia. The patient does not check her blood sugars as recommended by the provider. She does have a meter but does not take medications for DM and mainly controls DM with diet. Education and support given. The patient states that she will start taking blood sugars if she feels different. Denies lows;  however states that sometimes at night she experiences "sweating". Eduction and support given. Will continue to monitor. 11-30-2020: The patient denies any lows at this time. States her blood sugar this am was 109. States she is doing better with management of her overall health and well being. Will continue to monitor.   03-08-2021: Saw patient face to face and provided a calendar for her with places to record blood sugar and blood pressure readings. Also gave her the healthy eating handout for reference and review. The patient has the [facility] new telephone number for needs and  support between regular outreaches.      Review of patient status, including review of consultants reports, relevant laboratory and other test results, and medications completed;        Falls:  (Status: Goal on track: YES.) Provided written and verbal education re: potential causes of falls and Fall prevention strategies Reviewed medications and discussed potential side effects of medications such as dizziness and frequent urination. 9-19-202: The patient reviewed with the RNCM medications that she takes. Is following up with the pharm D as she forgets to take medications. Will monitor for medications that may cause her to have issues with fall hazards. 01-25-2021: Working with the Pharm D for medications management, questions and concerns. 03-08-2021: Ongoing support and education for medication needs from pharm D and RNCM.  Advised patient of importance of notifying provider of falls Assessed for signs and symptoms of orthostatic hypotension. 9-19-202: Denies any issues with orthostatic hypotension. Takes blood pressures sometimes at home. Advised the patient to take blood pressures and record. 03-08-2021: Denies any low blood pressures at this time. Assessed for falls since last encounter. 11-16-2020: The patient states that she has a fall about once a month. Last fall earlier this month. States no injuries. The patient states that she uses a cane when ambulating but sometimes falls because she doesn't use her cane. She is always cooking and cleaning and doing for others. She states its easy for her not to take good care of herself. 03-08-2021: The patient denies any new falls. Review of safety concerns and being cautious to prevent falls and injuries.  Assessed patients knowledge of fall risk prevention secondary to previously provided education Advised patient to discuss new falls, concerns, or questions  with provider  Depression and anxiety   (Status: Goal on track: YES.) Evaluation of current treatment  plan related to Anxiety and Depression, Limited social support, Level of care concerns, Mental Health Concerns , and Family and relationship dysfunction self-management and patient's adherence to plan as established by provider.  01-25-2021: The patient states that she is doing well right now. She is actually in [place] and she will be there until the end of December or later taking care of her elderly father. Review of medications and chronic conditions and the patient is doing well.  She states she is not stressed out and has her medications. Denies any new concerns. 03-08-2021: The patient states she is doing well. Her father is better and she is now home from [place]. She got completely moved into her daughters house this past weekend and she feels like this will be a positive and good change for her. She is resting well and getting her health under control. She denies any acute findings related to mental health today.  Discussed plans with patient for ongoing care management follow up and provided patient with direct contact information for care management team Advised patient to call the office for  changes in mood, axiety, depression or other mental health concerns; Provided education to patient re: managing stressors in life and working with the CCM team to effectively manage her depression and anxiety ; Reviewed medications with patient and discussed compliance. The patient sometimes forgets to take her medications. Is open to ideas to help her with effective management of medications. 11-30-2020: Is doing better with management of her medications. Working with the pharm D and also setting alarms on her phone for 2 times a day to help her remember to take her medications. 03-08-2021: Endorses compliance with medications. States she has her medications and denies any concerns with medications at this time. Will continue to monitor ; Collaborated with CCM team  regarding complex health needs and the  patient wanting help in management of her chronic conditions. The patient states that her and her youngest daughter are going to be moving in about a month. She feels this will help her a whole lot as she is really stressed over cooking, cleaning and helping care for so many family members. She often feels overwhelmed and knows she is not taking care of herself. 11-30-2020: The patient is excited about moving in with her daughter. Her daughter closes on her new house on Friday and the patient will be moving in with her. She states she will still come and help some at her current home but it will be a lot less stressful on her. She is optimistic. Review of self care.  01-25-2021: The patient is doing well currently. Is in [place] taking care of her elderly father. Does not know when she will return back to [place]. Likely at the end of December. 03-08-2021; The patient is doing well and denies any issues with her mental health and well being. Has support from the CCM team and is thankful for the support and ongoing education Provided patient with depression and anxiety  educational materials related to relieving stressors in her life; Social Work referral for help with ongoing support and stress relief measures for effective management of mental health well beig ; Pharmacy referral for help with management of medications and help with system that will help her ; Discussed plans with patient for ongoing care management follow up and provided patient with direct contact information for care management team; Advised patient to discuss new changes and stressors  with provider; Screening for signs and symptoms of depression related to chronic disease state;  Assessed social determinant of health barriers;   Hyperlipidemia:  (Status: Goal on track: YES.) Lab Results  Component Value Date   CHOL 133 09/09/2020   HDL 52 09/09/2020   LDLCALC 67 09/09/2020   TRIG 56 09/09/2020   CHOLHDL 2.6 09/09/2020      Medication review performed; medication list updated in electronic medical record.  Provider established cholesterol goals reviewed; Counseled on importance of regular laboratory monitoring as prescribed; Provided HLD educational materials; Reviewed role and benefits of statin for ASCVD risk reduction; Discussed strategies to manage statin-induced myalgias; Reviewed importance of limiting foods high in cholesterol. 03-08-2021: Education and handouts provided at today's visit for healthy eating with information on HLD, HTN, and heart health calendar. Reviewed exercise goals and target of 150 minutes per week;    Hypertension: (Status: Goal on Track (progressing): YES.) Long Term Goal  Last practice recorded BP readings:  BP Readings from Last 3 Encounters:  03/08/21 (!) 157/85  02/26/21 (!) 162/93  09/16/20 117/66  Most recent eGFR/CrCl:  Lab Results  Component Value  Date   EGFR 93 09/09/2020    No components found for: CRCL  Evaluation of current treatment plan related to hypertension self management and patient's adherence to plan as established by provider. 03-08-2021; The patient states that she is doing well with her blood pressures at home and is not under the stress that she was with caring for her elderly father and other family members. The patient will elevation in the office, may be somewhat related to "white-coat" syndrome. Education and support given. Provided education to patient re: stroke prevention, s/s of heart attack and stroke; Reviewed prescribed diet heart healthy/ADA diet  03-08-2021: Review and handout provided with educational information  Reviewed medications with patient and discussed importance of compliance. 03-08-2021: The patient is compliant with medications  Discussed plans with patient for ongoing care management follow up and provided patient with direct contact information for care management team; Advised patient, providing education and rationale, to  monitor blood pressure daily and record, calling PCP for findings outside established parameters;  Advised patient to discuss changes in blood pressures, excessive highs, questions, or concerns, with provider; Provided education on prescribed diet heart healthy/ADA ;  Discussed complications of poorly controlled blood pressure such as heart disease, stroke, circulatory complications, vision complications, kidney impairment, sexual dysfunction;     Pain:  (Status: Goal on track: YES.) Pain assessment performed. 03-08-2021: Rates her pain level at a 3 today Medications reviewed. 03-08-2021: Takes medications as directed Reviewed provider established plan for pain management. 11-16-2020: the patient states that she does take Tylenol for pain relief and discomfort at times. She stays very busy and she states she feels somewhat overwhelmed. She rates her pain today at a 4 on a scale of 0-10. 01-25-2021: Is effectively managing pain at this time. Denies any acute findings. Will continue to monitor. 03-08-2021: The patient has been dealing with cellulitis of left lower leg and was seen in ER on 02-26-2021, Urgent care on 03-01-2021 and pcp today. Per the patient the area is looing better and resolving. Will work with pcp for continued recommendations and support.  Discussed importance of adherence to all scheduled medical appointments. 11-30-2020: Reminded the patient of pharm D appointment on 12-04-2020. 01-25-2021: No upcoming appointments. Knows to call for changes or needs. 03-08-2021: Saw pcp today in the office Counseled on the importance of reporting any/all new or changed pain symptoms or management strategies to pain management provider; Advised patient to report to care team affect of pain on daily activities; Discussed use of relaxation techniques and/or diversional activities to assist with pain reduction (distraction, imagery, relaxation, massage, acupressure, TENS, heat, and cold application; Reviewed with  patient prescribed pharmacological and nonpharmacological pain relief strategies; Advised patient to discuss call the provider for changes in level or intensity of pain and discomfort with provider;   Left lower leg cellulitis   (Status: Goal on Track (progressing): YES.) Short Term Goal  Evaluation of current treatment plan related to  Left lower leg cellulitis ,  self-management and patient's adherence to plan as established by provider. Discussed plans with patient for ongoing care management follow up and provided patient with direct contact information for care management team Advised patient to call the office for changes in cellulitis to left lower leg, drainage, sx and sx of infection, questions, or concerns; Provided education to patient re: monitoring for changes to site, monitoring for sx and sx of infection, hygiene and care to the infected area,; Reviewed medications with patient and discussed compliance and to take  all antibiotics until course is done. ; Reviewed scheduled/upcoming provider appointments including saw pcp today in the office; Discussed plans with patient for ongoing care management follow up and provided patient with direct contact information for care management team; Advised patient to discuss changes in the are of the cellulitis, questions, or concerns with provider;   Patient Goals/Self-Care Activities: Patient will self administer medications as prescribed Patient will attend all scheduled provider appointments Patient will call pharmacy for medication refills Patient will attend church or other social activities Patient will continue to perform ADL's independently Patient will continue to perform IADL's independently Patient will call provider office for new concerns or questions Patient will work with BSW to address care coordination needs and will continue to work with the clinical team to address health care and disease management related needs.          Plan:Telephone follow up appointment with care management team member scheduled for:  04-05-2021 at 1 pm  Noreene Larsson RN, MSN, [place] Mobile: [phone]

## 2021-03-08 NOTE — Patient Instructions (Signed)
Visit Information  Thank you for taking time to visit with me today. Please don't hesitate to contact me if I can be of assistance to you before our next scheduled telephone appointment.  Following are the goals we discussed today:  RNCM Clinical Goal(s):  Patient will verbalize understanding of plan for management of HTN, HLD, DMII, Anxiety, Depression,  falls prevention, and Chronic pain  verbalize basic understanding of HLD, DMII, Anxiety, Depression, falls prevention, and Chronic pain  disease process and self health management plan  take all medications exactly as prescribed and will call provider for medication related questions demonstrate understanding of rationale for each prescribed medication patient desires to work with pharm D for effective management of chronic conditions and how to take her medications as prescribed demonstrate improved and ongoing adherence to prescribed treatment plan for HTN, HLD, DMII, Anxiety, Depression, falls prevention, and chronic pain  as evidenced by adherence to ADA/ carb modified diet adherence to prescribed medication regimen contacting provider for new or worsened symptoms or questions and working with the CCM team for effective management of chronic diseases  demonstrate improved and ongoing health management independence by remaining safe in home environment and effective management of Chronic conditions  continue to work with Consulting civil engineer to address care management and care coordination needs related to HLD, DMII, Anxiety, Depression, and chronic pain and fall prevention   work with pharmacist to address medication concerns as the patient admits she does not take her medications as prescribed and struggles with managing her medications related to HTN, HLD, DMII, Anxiety, Depression, and chronic pain and falls prevention and safety through collaboration with RN Care manager, provider, and care team.    Interventions: 1:1 collaboration with primary  care provider regarding development and update of comprehensive plan of care as evidenced by provider attestation and co-signature Inter-disciplinary care team collaboration (see longitudinal plan of care) Evaluation of current treatment plan related to  self management and patient's adherence to plan as established by provider     SDOH Barriers (Status: Goal on track: YES.)  Patient interviewed and SDOH assessment performed        SDOH Interventions     Flowsheet Row Most Recent Value  SDOH Interventions    Food Insecurity Interventions Intervention Not Indicated  Intimate Partner Violence Interventions Intervention Not Indicated  Physical Activity Interventions Other (Comments)  [no structured activities]  Social Connections Interventions Other (Comment)  [good support system, is moving soon]  Transportation Interventions Intervention Not Indicated       Patient interviewed and appropriate assessments performed Provided patient with information about CCM team and working with the patient to manage chronic conditions and effectively manage health and well being. The patient wants to be proactive in her care and is willing to work with the team Discussed plans with patient for ongoing care management follow up and provided patient with direct contact information for care management team Advised patient to call the office for changes in condition or SDOH needs. The patient states that she is hoping by working with the CCM team she can remember best how to take her medications and take care of herself beter.  Collaborated with RN Case Manager re: ongoing support and education for management of chronic conditions and health and well being goals. 03-08-2021: The patient has moved into a house wife her daughter in [place] and is doing very well. She had been out of town caring for her elderly father. She is doing well and in the  office for a follow up today of left lower leg cellulitis        Diabetes:  (Status: Goal on track: YES.)      Lab Results  Component Value Date    HGBA1C 5.7 (H) 09/09/2020    Assessed patient's understanding of A1c goal: <7% Provided education to patient about basic DM disease process; Reviewed prescribed diet with patient 11-16-2020: Review of heart healthy/ADA diet. The patient eats small meals and sometimes is not eating what she should. Sometimes eats a bedtime snack sometimes not. The patient encouraged to follow a heart healthy/ADA diet. Will send information by my chart system for review and help with meal choices and options. 11-30-2020: Is eating well and denies any issues with compliance with heart healthy/ADA diet. 01-25-2021: Is in [place] taking care of her elderly father. She states she is actually doing very well right now. Denies any acute issues with management of DM or following dietary restrictions. 03-08-2021: The patient feels better and is happy that she is moved and into the new house with her daughter. The patient was in the office today for follow up of left lower leg cellulitis and states that her blood sugars are WNL and she is eating better ; Counseled on importance of regular laboratory monitoring as prescribed. 03-08-2021: The patient keeps appointments and has regular lab work.         Discussed plans with patient for ongoing care management follow up and provided patient with direct contact information for care management team;      Provided patient with written educational materials related to hypo and hyperglycemia and importance of correct treatment. 11-16-2020: Reviewed with the patient the sx and sx of hypo and hyperglycemia. The patient does not check her blood sugars as recommended by the provider. She does have a meter but does not take medications for DM and mainly controls DM with diet. Education and support given. The patient states that she will start taking blood sugars if she feels different. Denies lows; however states  that sometimes at night she experiences "sweating". Eduction and support given. Will continue to monitor. 11-30-2020: The patient denies any lows at this time. States her blood sugar this am was 109. States she is doing better with management of her overall health and well being. Will continue to monitor.   03-08-2021: Saw patient face to face and provided a calendar for her with places to record blood sugar and blood pressure readings. Also gave her the healthy eating handout for reference and review. The patient has the [facility] new telephone number for needs and support between regular outreaches.      Review of patient status, including review of consultants reports, relevant laboratory and other test results, and medications completed;         Falls:  (Status: Goal on track: YES.) Provided written and verbal education re: potential causes of falls and Fall prevention strategies Reviewed medications and discussed potential side effects of medications such as dizziness and frequent urination. 9-19-202: The patient reviewed with the RNCM medications that she takes. Is following up with the pharm D as she forgets to take medications. Will monitor for medications that may cause her to have issues with fall hazards. 01-25-2021: Working with the Pharm D for medications management, questions and concerns. 03-08-2021: Ongoing support and education for medication needs from pharm D and RNCM.  Advised patient of importance of notifying provider of falls Assessed for signs and symptoms of orthostatic hypotension. 9-19-202: Denies  any issues with orthostatic hypotension. Takes blood pressures sometimes at home. Advised the patient to take blood pressures and record. 03-08-2021: Denies any low blood pressures at this time. Assessed for falls since last encounter. 11-16-2020: The patient states that she has a fall about once a month. Last fall earlier this month. States no injuries. The patient states that she uses a cane when  ambulating but sometimes falls because she doesn't use her cane. She is always cooking and cleaning and doing for others. She states its easy for her not to take good care of herself. 03-08-2021: The patient denies any new falls. Review of safety concerns and being cautious to prevent falls and injuries.  Assessed patients knowledge of fall risk prevention secondary to previously provided education Advised patient to discuss new falls, concerns, or questions  with provider   Depression and anxiety   (Status: Goal on track: YES.) Evaluation of current treatment plan related to Anxiety and Depression, Limited social support, Level of care concerns, Mental Health Concerns , and Family and relationship dysfunction self-management and patient's adherence to plan as established by provider.  01-25-2021: The patient states that she is doing well right now. She is actually in [place] and she will be there until the end of December or later taking care of her elderly father. Review of medications and chronic conditions and the patient is doing well.  She states she is not stressed out and has her medications. Denies any new concerns. 03-08-2021: The patient states she is doing well. Her father is better and she is now home from [place]. She got completely moved into her daughters house this past weekend and she feels like this will be a positive and good change for her. She is resting well and getting her health under control. She denies any acute findings related to mental health today.  Discussed plans with patient for ongoing care management follow up and provided patient with direct contact information for care management team Advised patient to call the office for changes in mood, axiety, depression or other mental health concerns; Provided education to patient re: managing stressors in life and working with the CCM team to effectively manage her depression and anxiety ; Reviewed medications with patient  and discussed compliance. The patient sometimes forgets to take her medications. Is open to ideas to help her with effective management of medications. 11-30-2020: Is doing better with management of her medications. Working with the pharm D and also setting alarms on her phone for 2 times a day to help her remember to take her medications. 03-08-2021: Endorses compliance with medications. States she has her medications and denies any concerns with medications at this time. Will continue to monitor ; Collaborated with CCM team  regarding complex health needs and the patient wanting help in management of her chronic conditions. The patient states that her and her youngest daughter are going to be moving in about a month. She feels this will help her a whole lot as she is really stressed over cooking, cleaning and helping care for so many family members. She often feels overwhelmed and knows she is not taking care of herself. 11-30-2020: The patient is excited about moving in with her daughter. Her daughter closes on her new house on Friday and the patient will be moving in with her. She states she will still come and help some at her current home but it will be a lot less stressful on her. She is optimistic. Review  of self care.  01-25-2021: The patient is doing well currently. Is in [place] taking care of her elderly father. Does not know when she will return back to [place]. Likely at the end of December. 03-08-2021; The patient is doing well and denies any issues with her mental health and well being. Has support from the CCM team and is thankful for the support and ongoing education Provided patient with depression and anxiety  educational materials related to relieving stressors in her life; Social Work referral for help with ongoing support and stress relief measures for effective management of mental health well beig ; Pharmacy referral for help with management of medications and help with system that will help  her ; Discussed plans with patient for ongoing care management follow up and provided patient with direct contact information for care management team; Advised patient to discuss new changes and stressors  with provider; Screening for signs and symptoms of depression related to chronic disease state;  Assessed social determinant of health barriers;    Hyperlipidemia:  (Status: Goal on track: YES.)      Lab Results  Component Value Date    CHOL 133 09/09/2020    HDL 52 09/09/2020    LDLCALC 67 09/09/2020    TRIG 56 09/09/2020    CHOLHDL 2.6 09/09/2020      Medication review performed; medication list updated in electronic medical record.  Provider established cholesterol goals reviewed; Counseled on importance of regular laboratory monitoring as prescribed; Provided HLD educational materials; Reviewed role and benefits of statin for ASCVD risk reduction; Discussed strategies to manage statin-induced myalgias; Reviewed importance of limiting foods high in cholesterol. 03-08-2021: Education and handouts provided at today's visit for healthy eating with information on HLD, HTN, and heart health calendar. Reviewed exercise goals and target of 150 minutes per week;       Hypertension: (Status: Goal on Track (progressing): YES.) Long Term Goal  Last practice recorded BP readings:     BP Readings from Last 3 Encounters:  03/08/21 (!) 157/85  02/26/21 (!) 162/93  09/16/20 117/66  Most recent eGFR/CrCl:       Lab Results  Component Value Date    EGFR 93 09/09/2020    No components found for: CRCL   Evaluation of current treatment plan related to hypertension self management and patient's adherence to plan as established by provider. 03-08-2021; The patient states that she is doing well with her blood pressures at home and is not under the stress that she was with caring for her elderly father and other family members. The patient will elevation in the office, may be somewhat related to  "white-coat" syndrome. Education and support given. Provided education to patient re: stroke prevention, s/s of heart attack and stroke; Reviewed prescribed diet heart healthy/ADA diet  03-08-2021: Review and handout provided with educational information  Reviewed medications with patient and discussed importance of compliance. 03-08-2021: The patient is compliant with medications  Discussed plans with patient for ongoing care management follow up and provided patient with direct contact information for care management team; Advised patient, providing education and rationale, to monitor blood pressure daily and record, calling PCP for findings outside established parameters;  Advised patient to discuss changes in blood pressures, excessive highs, questions, or concerns, with provider; Provided education on prescribed diet heart healthy/ADA ;  Discussed complications of poorly controlled blood pressure such as heart disease, stroke, circulatory complications, vision complications, kidney impairment, sexual dysfunction;       Pain:  (  Status: Goal on track: YES.) Pain assessment performed. 03-08-2021: Rates her pain level at a 3 today Medications reviewed. 03-08-2021: Takes medications as directed Reviewed provider established plan for pain management. 11-16-2020: the patient states that she does take Tylenol for pain relief and discomfort at times. She stays very busy and she states she feels somewhat overwhelmed. She rates her pain today at a 4 on a scale of 0-10. 01-25-2021: Is effectively managing pain at this time. Denies any acute findings. Will continue to monitor. 03-08-2021: The patient has been dealing with cellulitis of left lower leg and was seen in ER on 02-26-2021, Urgent care on 03-01-2021 and pcp today. Per the patient the area is looing better and resolving. Will work with pcp for continued recommendations and support.  Discussed importance of adherence to all scheduled medical appointments.  11-30-2020: Reminded the patient of pharm D appointment on 12-04-2020. 01-25-2021: No upcoming appointments. Knows to call for changes or needs. 03-08-2021: Saw pcp today in the office Counseled on the importance of reporting any/all new or changed pain symptoms or management strategies to pain management provider; Advised patient to report to care team affect of pain on daily activities; Discussed use of relaxation techniques and/or diversional activities to assist with pain reduction (distraction, imagery, relaxation, massage, acupressure, TENS, heat, and cold application; Reviewed with patient prescribed pharmacological and nonpharmacological pain relief strategies; Advised patient to discuss call the provider for changes in level or intensity of pain and discomfort with provider;     Left lower leg cellulitis   (Status: Goal on Track (progressing): YES.) Short Term Goal  Evaluation of current treatment plan related to  Left lower leg cellulitis ,  self-management and patient's adherence to plan as established by provider. Discussed plans with patient for ongoing care management follow up and provided patient with direct contact information for care management team Advised patient to call the office for changes in cellulitis to left lower leg, drainage, sx and sx of infection, questions, or concerns; Provided education to patient re: monitoring for changes to site, monitoring for sx and sx of infection, hygiene and care to the infected area,; Reviewed medications with patient and discussed compliance and to take all antibiotics until course is done. ; Reviewed scheduled/upcoming provider appointments including saw pcp today in the office; Discussed plans with patient for ongoing care management follow up and provided patient with direct contact information for care management team; Advised patient to discuss changes in the are of the cellulitis, questions, or concerns with provider;    Patient  Goals/Self-Care Activities: Patient will self administer medications as prescribed Patient will attend all scheduled provider appointments Patient will call pharmacy for medication refills Patient will attend church or other social activities Patient will continue to perform ADL's independently Patient will continue to perform IADL's independently Patient will call provider office for new concerns or questions Patient will work with BSW to address care coordination needs and will continue to work with the clinical team to address health care and disease management related needs.        Our next appointment is by telephone on 04-05-2021 at 1 pm  Please call the care guide team at [phone] if you need to cancel or reschedule your appointment.   If you are experiencing a Mental Health or [place] or need someone to talk to, please call the Suicide and Crisis Lifeline: 988 call the [place] National Suicide Prevention Lifeline: 978-401-8194 or TTY: 925-441-2079 TTY 478-719-3617) to talk to a  trained counselor call 1-800-273-TALK (toll free, 24 hour hotline)   Patient verbalizes understanding of instructions provided today and agrees to view in [place].   Noreene Larsson RN, MSN, [place] Mobile: [phone]

## 2021-03-08 NOTE — Progress Notes (Signed)
Subjective:    Patient ID: Janet [name], female    DOB: [birth year], 71 y.o.   MRN: [id number]  Janet [name] is a 72 y.o. female presenting on 03/08/2021 for Cellulitis   HPI  Left Lower Ext venous Stasis Dermatitis  Recent history 1-2 weeks, she had a scratch on Left leg and it oozed clear fluid then several days later it become painful swollen and infected. Persistent cellulitis - was not resolving.  Patient seen in [facility] ED on 12/30 for cellulitis and swelling with pain of leg. She was placed on Keflex antibiotic. Symptoms did not resolve. Urgent Care on 03/01/21 gave her Bactrim, this has not helped and she is having nausea vomiting cannot keep antibiotic down. 03/03/21 phone call message see chart.  Today she reports stopped Bactrim and finished Keflex and now symptoms improved today  No further worsening pain and no open ulceration or oozing or weeping from leg.  She has known history of varicose veins and stasis with pain due to swelling. Previously saw Vascular specialist in 08/2019. Has not returned. She has compression stockings.   RLS / Leg Spasms Chronic problem. Followed by Neurology Gavin Potters seen recently 03/04/21 takes Baclofen every 1-2 weeks with some relief of leg spasm. Takes Pramipexole 1mg  BID with good results needs re order   Depression screen [facility] 2/9 03/08/2021 10/20/2020 09/16/2020  Decreased Interest 1 3 1   Down, Depressed, Hopeless 1 1 1   PHQ - 2 Score 2 4 2   Altered sleeping 3 3 3   Tired, decreased energy 0 3 1  Change in appetite 1 0 0  Feeling bad or failure about yourself  0 0 0  Trouble concentrating 1 0 1  Moving slowly or fidgety/restless 0 0 0  Suicidal thoughts 0 0 0  PHQ-9 Score 7 10 7   Difficult doing work/chores Not difficult at all Somewhat difficult Not difficult at all    Social History   Tobacco Use   Smoking status: Former    Packs/day: 1.00    Years: 25.00    Pack years: 25.00    Types: Cigarettes   Smokeless tobacco: Former  09/18/2020 Use: Never used  Substance Use Topics   Alcohol use: Never   Drug use: Never    Review of Systems Per HPI unless specifically indicated above     Objective:    BP (!) 157/85    Pulse 80    Ht 5\' 2"  (1.575 m)    Wt 218 lb (98.9 kg)    SpO2 100%    BMI 39.87 kg/m   Wt Readings from Last 3 Encounters:  03/08/21 218 lb (98.9 kg)  02/26/21 200 lb (90.7 kg)  10/20/20 210 lb (95.3 kg)    Physical Exam Vitals and nursing note reviewed.  Constitutional:      General: She is not in acute distress.    Appearance: Normal appearance. She is well-developed. She is not diaphoretic.     Comments: Well-appearing, comfortable, cooperative  HENT:     Head: Normocephalic and atraumatic.  Eyes:     General:        Right eye: No discharge.        Left eye: No discharge.     Conjunctiva/sclera: Conjunctivae normal.  Cardiovascular:     Rate and Rhythm: Normal rate.  Pulmonary:     Effort: Pulmonary effort is normal.  Musculoskeletal:     Right lower leg: Edema present.     Left lower leg:  Edema (bilateral legs +1 pitting edema) present.  Skin:    General: Skin is warm and dry.     Findings: Erythema (chronic venous stasis changes bilateral lower extremity with some hyperpigmentation darkening, L has slight erythema to it but no acute evidence of infection or warmth.) present. No rash.     Comments: No open ulceration on lower extremities.  Neurological:     Mental Status: She is alert and oriented to person, place, and time.  Psychiatric:        Mood and Affect: Mood normal.        Behavior: Behavior normal.        Thought Content: Thought content normal.     Comments: Well groomed, good eye contact, normal speech and thoughts   Results for orders placed or performed during the hospital encounter of 02/26/21  Brain natriuretic peptide  Result Value Ref Range   B Natriuretic Peptide 17.4 0.0 - 100.0 pg/mL  CBC with Differential  Result Value Ref Range   WBC 6.6 4.0 -  10.5 K/uL   RBC 4.13 3.87 - 5.11 MIL/uL   Hemoglobin 12.1 12.0 - 15.0 g/dL   HCT 16.138.7 09.636.0 - 04.546.0 %   MCV 93.7 80.0 - 100.0 fL   MCH 29.3 26.0 - 34.0 pg   MCHC 31.3 30.0 - 36.0 g/dL   RDW 40.914.7 81.111.5 - 91.415.5 %   Platelets 245 150 - 400 K/uL   nRBC 0.0 0.0 - 0.2 %   Neutrophils Relative % 51 %   Neutro Abs 3.4 1.7 - 7.7 K/uL   Lymphocytes Relative 31 %   Lymphs Abs 2.0 0.7 - 4.0 K/uL   Monocytes Relative 13 %   Monocytes Absolute 0.9 0.1 - 1.0 K/uL   Eosinophils Relative 4 %   Eosinophils Absolute 0.3 0.0 - 0.5 K/uL   Basophils Relative 0 %   Basophils Absolute 0.0 0.0 - 0.1 K/uL   Immature Granulocytes 1 %   Abs Immature Granulocytes 0.03 0.00 - 0.07 K/uL  Protime-INR  Result Value Ref Range   Prothrombin Time 12.6 11.4 - 15.2 seconds   INR 0.9 0.8 - 1.2      Assessment & Plan:   Problem List Items Addressed This Visit     Symptomatic varicose veins, bilateral   Restless leg syndrome   Relevant Medications   baclofen (LIORESAL) 10 MG tablet   pramipexole (MIRAPEX) 1 MG tablet   Other Visit Diagnoses     Venous stasis dermatitis of left lower extremity    -  Primary       RLS Followed by Neurology [facility] Keep on Pramipexole 1mg  BID, refilled Refill Baclofen PRN  Venous Stasis Dermatitis, LLE Symptomatic Varicose veins Cellulitis - RESOLVED Likely main etiology flare of stasis dermatitis Completed Keflex with resolution. Could not tolerate bactrim. No further management with antibiotics at this time. No acute infection on exam. Reassuring. Previously w/ [facility] 08/2019, she should return to them for further longer term management of chronic varicose veins that are painful and persistent insufficiency as main cause of her symptomology.  Meds ordered this encounter  Medications   baclofen (LIORESAL) 10 MG tablet    Sig: Take 1 tablet (10 mg total) by mouth at bedtime as needed for muscle spasms.    Dispense:  30 each    Refill:  2   pramipexole (MIRAPEX) 1 MG  tablet    Sig: Take 1 tablet (1 mg total) by mouth 2 (two) times daily.  Dispense:  180 tablet    Refill:  3      Follow up plan: Return in about 6 months (around 09/05/2021) for 6 month fasting lab only then 1 week later Annual Physical.  Future labs ordered for 08/2021  Saralyn Pilar, DO [facility] 03/08/2021, 11:03 AM

## 2021-03-16 ENCOUNTER — Telehealth: Payer: Self-pay

## 2021-03-16 NOTE — Telephone Encounter (Signed)
Here is my clarification:  At our visit, we discussed *Returning* to the Vascular specialist.  She saw them already back in 2021. In July 2021 she saw Sheppard Plumber NP at [facility].  Since this is the same problem. And it has been only 1.5 years, I asked her to call them to re-schedule for same issue, rather than starting the new patient referral process.  Now if they cannot take her back as a patient, and require a new referral, she was asked to call me back so I can place a new referral again.  Let me know if she needs referral or if she can just schedule. Thanks!  Saralyn Pilar, DO [facility] 03/16/2021, 4:14 PM

## 2021-03-16 NOTE — Telephone Encounter (Signed)
Copied from [place] [id number]. Topic: Referral - Status >> Mar 16, 2021 11:55 AM Erick Blinks wrote: Reason for CRM: Pt called reporting that she has been referred to Vein and V[facility], I dont see a referral charted. She says this was decided during her visit 03/08/2021.   Best contact: [phone]

## 2021-03-20 IMAGING — DX DG LUMBAR SPINE COMPLETE 4+V
5 series · 5 of 5 positions shown · non-contrast
Comparison: None available.

CLINICAL DATA: Chronic left-sided low back pain with left-sided
sciatica. Primary osteoarthritis. Peripheral nerve disease.

EXAM:
LUMBAR SPINE - COMPLETE 4+ VIEW

[l-spine ap]
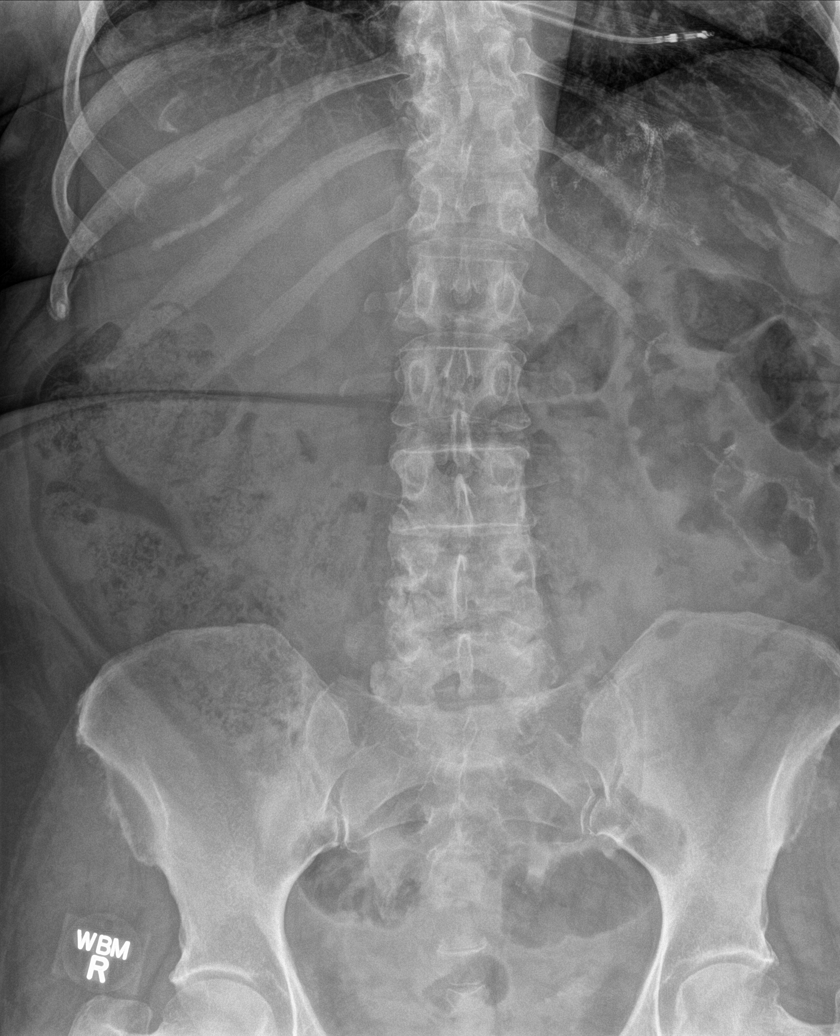

[l-spine obl (1 of 2)]
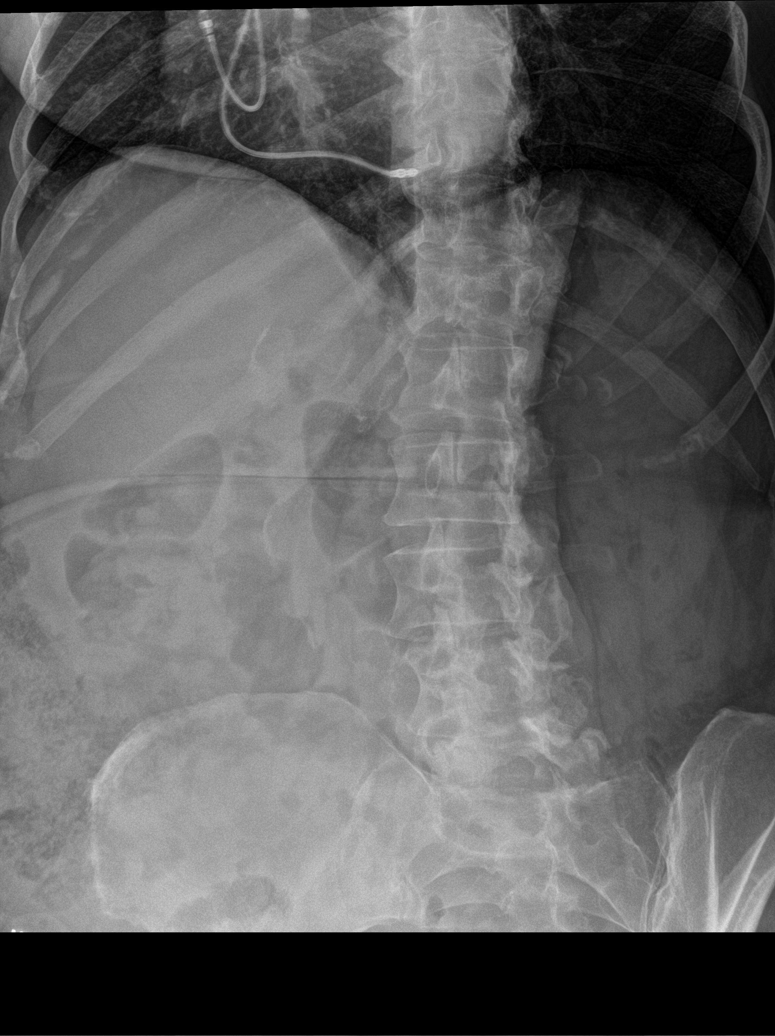

[l-spine obl (2 of 2)]
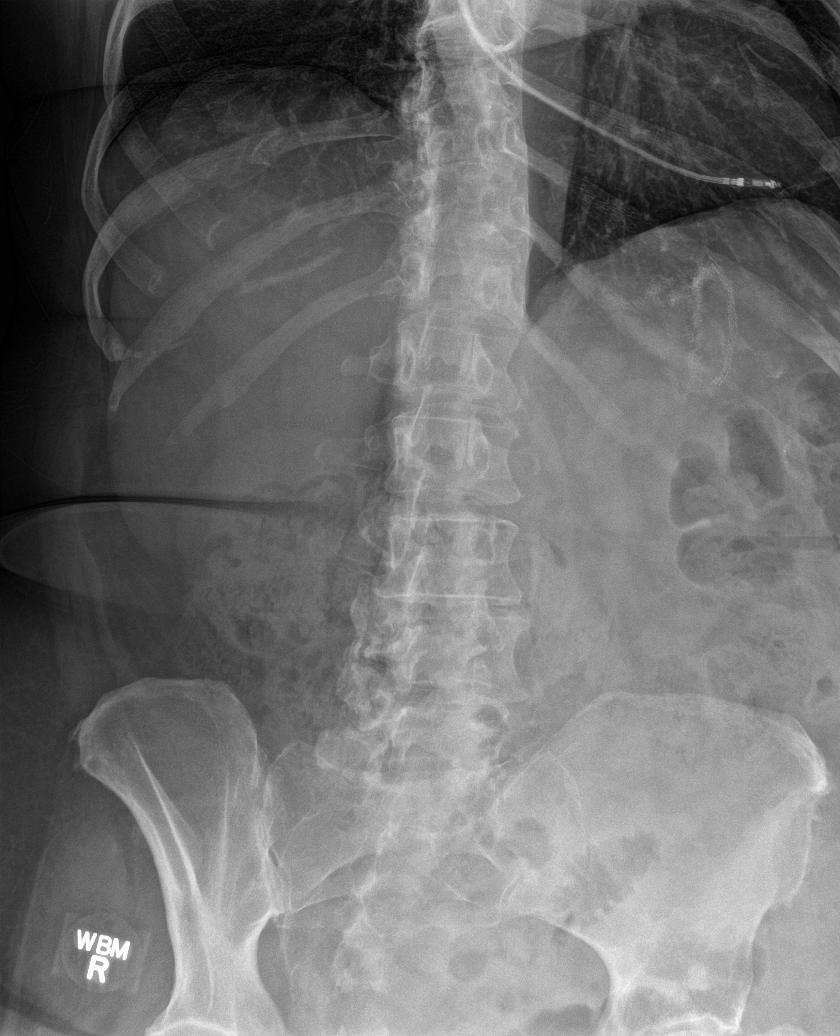

[l-spine lat]
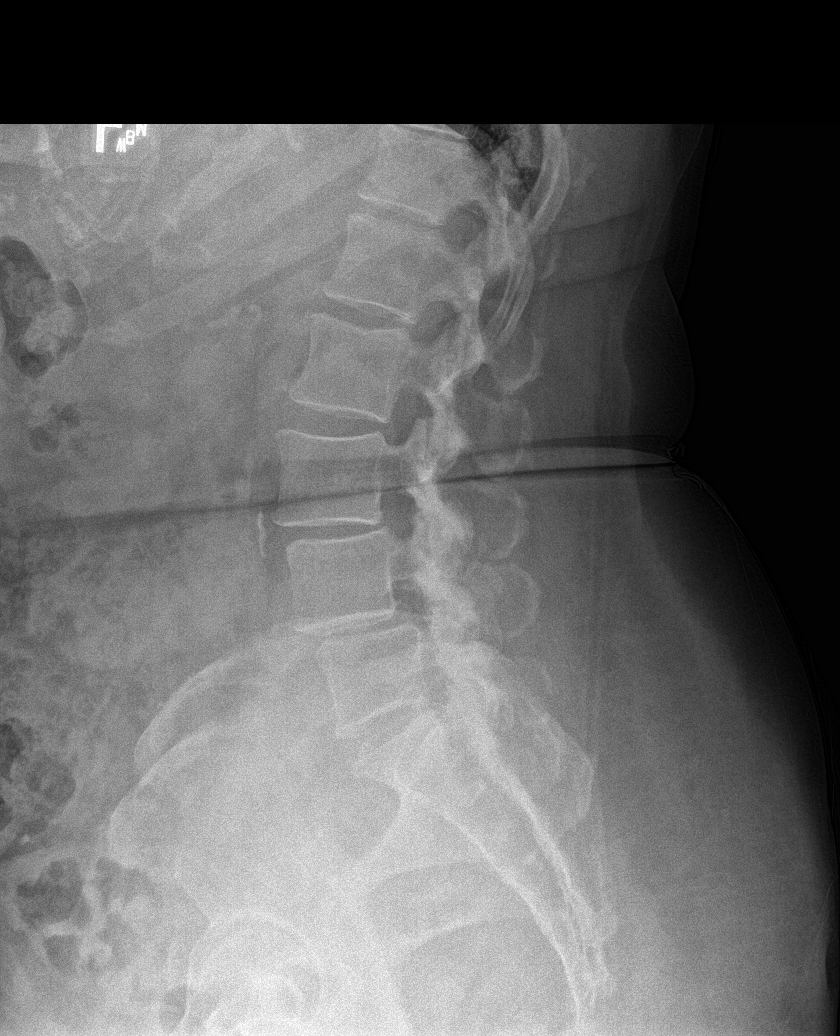

[l-spine spot]
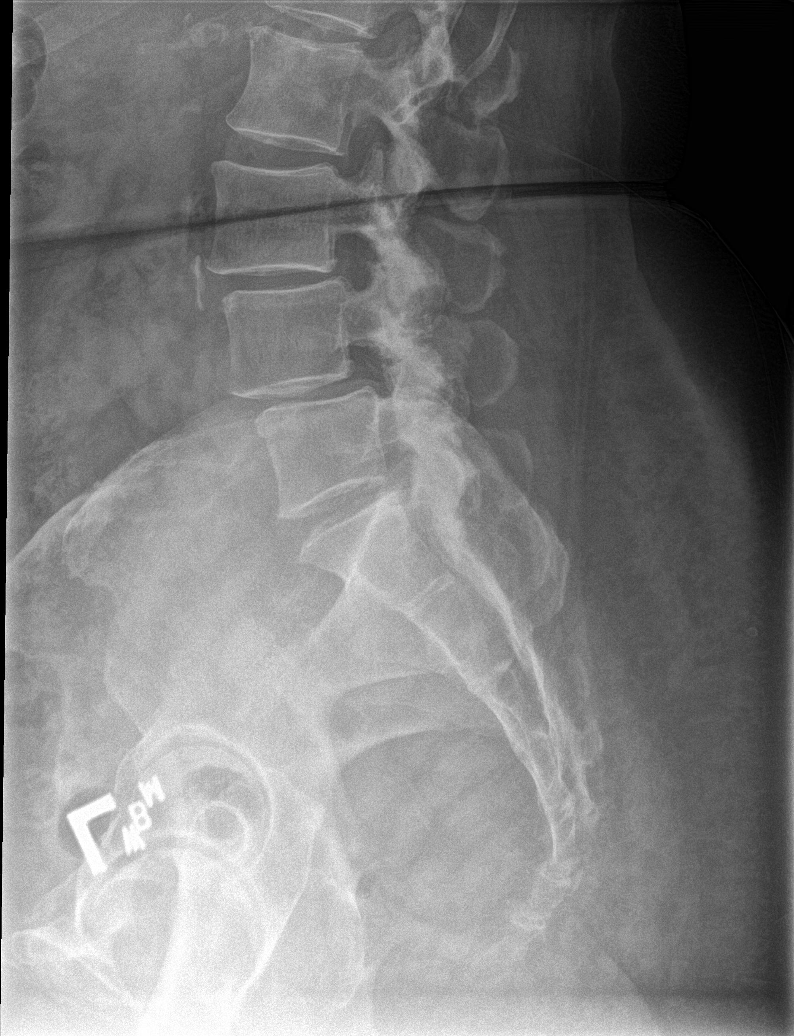

[5 of 5 positions shown; findings below may reference images not displayed]

FINDINGS: 7 mm anterolisthesis of L4 on L5, likely facet mediated with
prominent facet hypertrophy at this level. 3 mm anterolisthesis of
L3 on L4, also likely facet mediated. There is disc space narrowing
and endplate spurring at L3-L4 and L4-L5. Facet hypertrophy at
L3-L4, L4-L5, and L5-S1. Vertebral body heights are preserved. No
acute or compression fracture. The sacroiliac joints are congruent.
Enteric sutures noted in the left upper quadrant.
IMPRESSION: 1. 7 mm anterolisthesis of L4 on L5 and 3 mm anterolisthesis of L3
on L4, likely facet mediated.
2. Mild degenerative disc disease at L3-L4 and L4-L5.
3. Facet hypertrophy at L3-L4, L4-L5, and L5-S1.

## 2021-03-22 ENCOUNTER — Ambulatory Visit: Payer: Medicare Other | Admitting: Pharmacist

## 2021-03-22 DIAGNOSIS — I10 Essential (primary) hypertension: Secondary | ICD-10-CM

## 2021-03-22 DIAGNOSIS — E1169 Type 2 diabetes mellitus with other specified complication: Secondary | ICD-10-CM

## 2021-03-22 DIAGNOSIS — E785 Hyperlipidemia, unspecified: Secondary | ICD-10-CM

## 2021-03-22 NOTE — Chronic Care Management (AMB) (Signed)
Chronic Care Management CCM Pharmacy Note  03/22/2021 Name:  Bethannie Kempski MRN:  [id number] DOB:  Aug 14, 1950   Subjective: Tenisa Fleetwood is an 71 y.o. year old female who is a primary patient of Olin Hauser, DO.  The CCM team was consulted for assistance with disease management and care coordination needs.    Engaged with patient by telephone for follow up visit for pharmacy case management and/or care coordination services.   Objective:  Medications Reviewed Today     Reviewed by Rennis Petty, RPH-CPP (Pharmacist) on 03/22/21 at [phone]  Med List Status: <None>   Medication Order Taking? Sig Documenting Provider Last Dose Status Informant  aspirin 81 MG EC tablet [id number] Yes Take 81 mg by mouth daily. [provider] Taking Active   atorvastatin (LIPITOR) 40 MG tablet [id number] Yes Take 40 mg by mouth at bedtime. [provider] Taking Active   baclofen (LIORESAL) 10 MG tablet [id number] Yes Take 1 tablet (10 mg total) by mouth at bedtime as needed for muscle spasms. Olin Hauser, DO Taking Active   DULoxetine (CYMBALTA) 30 MG capsule [id number] Yes Take 1 capsule (30 mg total) by mouth daily. Olin Hauser, DO Taking Active   gabapentin (NEURONTIN) 300 MG capsule [id number] Yes Take 300 mg by mouth 3 (three) times daily. [provider] Taking Active            Med Note Curley Spice, [facility] A   Fri Dec 04, 2020  9:37 AM) Taking 1 capsule (300 mg) in the morning and 2 capsules  (600 mg) at night as directed by Neurologist.  losartan-hydrochlorothiazide (HYZAAR) 100-25 MG tablet [id number] Yes Take 1 tablet by mouth daily. Olin Hauser, DO Taking Active   meloxicam (MOBIC) 15 MG tablet [id number] No Take 15 mg by mouth daily as needed.  Patient not taking: Reported on 03/22/2021   [provider] Not Taking Active   pramipexole (MIRAPEX) 1 MG tablet [id number] Yes Take 1 tablet (1 mg total) by mouth 2 (two) times  daily. Olin Hauser, DO Taking Active             Pertinent Labs:   Lab Results  Component Value Date   CHOL 133 09/09/2020   HDL 52 09/09/2020   LDLCALC 67 09/09/2020   TRIG 56 09/09/2020   CHOLHDL 2.6 09/09/2020   Lab Results  Component Value Date   CREATININE 0.71 09/09/2020   BUN 26 (H) 09/09/2020   NA 142 09/09/2020   K 4.4 09/09/2020   CL 105 09/09/2020   CO2 29 09/09/2020   BP Readings from Last 3 Encounters:  03/08/21 (!) 157/85  02/26/21 (!) 162/93  09/16/20 117/66   Pulse Readings from Last 3 Encounters:  03/08/21 80  02/26/21 83  09/16/20 69     SDOH:  (Social Determinants of Health) assessments and interventions performed:    [place]  Review of patient past medical history, allergies, medications, health status, including review of consultants reports, laboratory and other test data, was performed as part of comprehensive evaluation and provision of chronic care management services.   Care Plan : PharmD - Medication Adherence  Updates made by Rennis Petty, RPH-CPP since 03/22/2021 12:00 AM     Problem: Disease Progression      Long-Range Goal: Disease Progression Prevented or Minimized   Start Date: 11/27/2020  Expected End Date: 02/25/2021  This Visit's Progress: On track  Recent Progress: On track  Priority: High  Note:  Current Barriers:  Unable to self administer medications as prescribed  Pharmacist Clinical Goal(s):  Over the next 90 days, patient will achieve adherence to monitoring guidelines and medication adherence to achieve therapeutic efficacy through collaboration with PharmD and provider.    Interventions: 1:1 collaboration with Olin Hauser, DO regarding development and update of comprehensive plan of care as evidenced by provider attestation and co-signature Inter-disciplinary care team collaboration (see longitudinal plan of care) Perform chart review.  Patient seen for Office  Visit with [facility]rology on 1/5. Provider advised patient: Increase Gabapentin to 300 mg in the morning and 900 mg at night for a week, then increase to 300 mg in the morning, 300 mg in the afternoon, and 900 mg at night.  Continue Mirapex 1 mg twice daily, refilled.  Continue Cymbalta 30 mg once daily, refilled.  Follow up with primary care regarding left leg pain. Could consider referral to dermatology for left leg pain Office Visit with PCP on 1/9 regarding cellulitis  Today reports has called vascular specialist and left messages regarding scheduling an appointment last week but has not receive a call back.  Encourage patient to try calling [facility] again today.  Call today with patient on the line and leave a message on appointment line requesting a call back to patient.  Medication adherence: Reports using weekly pillbox and adherence improved. Reports missed one morning dose of medication over past week Encourage patient to continue using weekly pillbox Reports does not feel like she needs to use phone alarms at this time  Hypertension: Current treatment: losartan-HCTZ 100-25 mg daily Reports has been checking, but not writing down numbers. Encourage patient to continue monitoring home BP and HR 1-2 times week and to keep a log of the results  Type 2 Diabetes: Controlled: current treatment: none  Hyperlipidemia: Controlled: current treatment: atorvastatin 40 mg daily  Patient Goals/Self-Care Activities Over the next 90 days, patient will:  - focus on medication adherence by:   Using weekly pillbox   Using medication reminder alarms on phone for 8 am and 9 pm  Follow Up Plan: Telephone follow up appointment with care management team member scheduled for: 04/21/2021 at 8:30 AM       Wallace Cullens, PharmD, [facility] [place], [place] [phone]

## 2021-03-22 NOTE — Patient Instructions (Signed)
Visit Information  Thank you for taking time to visit with me today. Please don't hesitate to contact me if I can be of assistance to you before our next scheduled telephone appointment.  Following are the goals we discussed today:   Goals Addressed             This Visit's Progress    Pharmacy Goals       Please check your home blood pressure, keep a log of the results and bring this with you to your medical appointments.  Feel free to call me with any questions or concerns. I look forward to our next call!    Estelle Grumbles, PharmD, P[place]y [place], CPP Clinical Pharmacist [facility] [phone]         Our next appointment is by telephone on 04/21/2021 at 8:30 AM  Please call the care guide team at [phone] if you need to cancel or reschedule your appointment.    The patient verbalized understanding of instructions, educational materials, and care plan provided today and declined offer to receive copy of patient instructions, educational materials, and care plan.

## 2021-03-29 ENCOUNTER — Telehealth: Payer: Medicare Other

## 2021-03-30 DIAGNOSIS — E1169 Type 2 diabetes mellitus with other specified complication: Secondary | ICD-10-CM | POA: Diagnosis not present

## 2021-03-30 DIAGNOSIS — E785 Hyperlipidemia, unspecified: Secondary | ICD-10-CM

## 2021-03-30 DIAGNOSIS — I10 Essential (primary) hypertension: Secondary | ICD-10-CM | POA: Diagnosis not present

## 2021-03-30 DIAGNOSIS — F3342 Major depressive disorder, recurrent, in full remission: Secondary | ICD-10-CM

## 2021-03-30 DIAGNOSIS — F331 Major depressive disorder, recurrent, moderate: Secondary | ICD-10-CM | POA: Diagnosis not present

## 2021-03-30 DIAGNOSIS — M1712 Unilateral primary osteoarthritis, left knee: Secondary | ICD-10-CM

## 2021-03-31 DIAGNOSIS — G629 Polyneuropathy, unspecified: Secondary | ICD-10-CM | POA: Diagnosis not present

## 2021-03-31 DIAGNOSIS — M79642 Pain in left hand: Secondary | ICD-10-CM | POA: Diagnosis not present

## 2021-03-31 DIAGNOSIS — E1169 Type 2 diabetes mellitus with other specified complication: Secondary | ICD-10-CM | POA: Diagnosis not present

## 2021-03-31 DIAGNOSIS — M48061 Spinal stenosis, lumbar region without neurogenic claudication: Secondary | ICD-10-CM | POA: Diagnosis not present

## 2021-03-31 DIAGNOSIS — G2581 Restless legs syndrome: Secondary | ICD-10-CM | POA: Diagnosis not present

## 2021-03-31 DIAGNOSIS — R29898 Other symptoms and signs involving the musculoskeletal system: Secondary | ICD-10-CM | POA: Diagnosis not present

## 2021-03-31 DIAGNOSIS — M79641 Pain in right hand: Secondary | ICD-10-CM | POA: Diagnosis not present

## 2021-04-05 ENCOUNTER — Telehealth: Payer: Medicare Other

## 2021-04-05 ENCOUNTER — Ambulatory Visit (INDEPENDENT_AMBULATORY_CARE_PROVIDER_SITE_OTHER): Payer: Medicare Other

## 2021-04-05 DIAGNOSIS — M150 Primary generalized (osteo)arthritis: Secondary | ICD-10-CM

## 2021-04-05 DIAGNOSIS — M1712 Unilateral primary osteoarthritis, left knee: Secondary | ICD-10-CM

## 2021-04-05 DIAGNOSIS — I739 Peripheral vascular disease, unspecified: Secondary | ICD-10-CM

## 2021-04-05 DIAGNOSIS — E785 Hyperlipidemia, unspecified: Secondary | ICD-10-CM

## 2021-04-05 DIAGNOSIS — G8929 Other chronic pain: Secondary | ICD-10-CM

## 2021-04-05 DIAGNOSIS — F411 Generalized anxiety disorder: Secondary | ICD-10-CM

## 2021-04-05 DIAGNOSIS — M159 Polyosteoarthritis, unspecified: Secondary | ICD-10-CM

## 2021-04-05 DIAGNOSIS — E1169 Type 2 diabetes mellitus with other specified complication: Secondary | ICD-10-CM

## 2021-04-05 DIAGNOSIS — I10 Essential (primary) hypertension: Secondary | ICD-10-CM

## 2021-04-05 DIAGNOSIS — I872 Venous insufficiency (chronic) (peripheral): Secondary | ICD-10-CM

## 2021-04-05 DIAGNOSIS — Z9181 History of falling: Secondary | ICD-10-CM

## 2021-04-05 DIAGNOSIS — F3342 Major depressive disorder, recurrent, in full remission: Secondary | ICD-10-CM

## 2021-04-05 DIAGNOSIS — F331 Major depressive disorder, recurrent, moderate: Secondary | ICD-10-CM

## 2021-04-05 NOTE — Chronic Care Management (AMB) (Signed)
Chronic Care Management   CCM RN Visit Note  04/05/2021 Name: Janet [name] MRN: [id number] DOB: 1950/11/10  Subjective: Janet [name] is a 71 y.o. year old female who is a primary care patient of Olin Hauser, DO. The care management team was consulted for assistance with disease management and care coordination needs.    Engaged with patient by telephone for follow up visit in response to provider referral for case management and/or care coordination services.   Consent to Services:  The patient was given information about Chronic Care Management services, agreed to services, and gave verbal consent prior to initiation of services.  Please see initial visit note for detailed documentation.   Patient agreed to services and verbal consent obtained.   Assessment: Review of patient past medical history, allergies, medications, health status, including review of consultants reports, laboratory and other test data, was performed as part of comprehensive evaluation and provision of chronic care management services.   SDOH (Social Determinants of Health) assessments and interventions performed:    CCM Care Plan  No Known Allergies  Outpatient Encounter Medications as of 04/05/2021  Medication Sig Note   aspirin 81 MG EC tablet Take 81 mg by mouth daily.    atorvastatin (LIPITOR) 40 MG tablet Take 40 mg by mouth at bedtime.    baclofen (LIORESAL) 10 MG tablet Take 1 tablet (10 mg total) by mouth at bedtime as needed for muscle spasms.    DULoxetine (CYMBALTA) 30 MG capsule Take 1 capsule (30 mg total) by mouth daily.    gabapentin (NEURONTIN) 300 MG capsule Take 300 mg by mouth 3 (three) times daily. 12/04/2020: Taking 1 capsule (300 mg) in the morning and 2 capsules  (600 mg) at night as directed by Neurologist.   losartan-hydrochlorothiazide (HYZAAR) 100-25 MG tablet Take 1 tablet by mouth daily.    meloxicam (MOBIC) 15 MG tablet Take 15 mg by mouth daily as needed. (Patient not taking:  Reported on 03/22/2021)    pramipexole (MIRAPEX) 1 MG tablet Take 1 tablet (1 mg total) by mouth 2 (two) times daily.    No facility-administered encounter medications on file as of 04/05/2021.    Patient Active Problem List   Diagnosis Date Noted   Chronic left-sided low back pain with left-sided sciatica 04/14/2020   History of deep vein thrombosis 09/16/2019   Type 2 diabetes mellitus with other specified complication ([place]) 98/92/1194   Restless leg syndrome 08/20/2019   Major depression, recurrent, full remission ([place]) 08/20/2019   Other insomnia 08/20/2019   Symptomatic varicose veins, bilateral 08/20/2019   Edema of left lower extremity 08/20/2019   Primary osteoarthritis involving multiple joints 08/20/2019   Venous insufficiency of both lower extremities 08/20/2019   History of bariatric surgery 08/20/2019   Cardiac pacemaker 08/20/2019   Anxiety disorder 10/31/2018   Edema 10/31/2018   Heart murmur 10/31/2018   Osteoporosis 10/31/2018   Primary osteoarthritis of left knee 10/19/2018   History of pacemaker 09/12/2018   Shortness of breath 09/12/2018   Palpitations 06/20/2016   Postmenopausal bleeding 08/04/2015   Lymphedema 12/16/2014   Osteoarthritis 07/31/2014   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   Gastroesophageal reflux disease 07/31/2014   Mixed hyperlipidemia 07/31/2014   Morbid obesity ([place]) 07/31/2014   Obstructive sleep apnea syndrome 07/31/2014   Peripheral nerve disease 07/31/2014   Urinary incontinence 07/31/2014   History of adenomatous polyp of colon 02/28/2014    Conditions to be addressed/monitored:HTN, HLD, DMII, Anxiety, Depression, Osteoarthritis, and venous insufficiency/history of cellulitis in bilateral legs, falls  prevention   Care Plan : RNCM: Adult plan of care for  Chronic Disease Management and Care Coordination Needs  Updates made by Vanita Ingles, RN since 04/05/2021 12:00 AM     Problem: RNCM: Adult plan of care for Chronic Disease Management and  Care Coordination Needs   Priority: High  Onset Date: 11/16/2020     Long-Range Goal: RNCM: Adult Plan of Care for Chronic Disease Management and Care Coordination Needs   Start Date: 11/16/2020  Expected End Date: 11/16/2021  Recent Progress: On track  Priority: High  Note:   Current Barriers:  Knowledge Deficits related to plan of care for management of HTN, HLD, DMII, Depression, Anxiety with family circumstances and not caring for self, Depression: depressed mood, and chronic pain (RLS, OA, Chronic back pain),l and falls and safety concerns insomnia, fatigue, anxiety, disturbed sleep,, Caregiver Stress, Grief, Insomnia/Sleep Difficulties, and Stress at family circumstances, and grief process over the loss of her oldest daughter to [place] in October 2021  Care Coordination needs related to Limited social support, Level of care concerns, Mental Health Concerns , and Family and relationship dysfunction  Chronic Disease Management support and education needs related to HTN,  HLD, DMII, Depression, Anxiety with Social Anxiety,, Depression: depressed mood, falls prevention and safety and chronic pain  insomnia, fatigue, anxiety,, Caregiver Stress, Grief, Mood Instability, and Stress at family situations and circumstances, and grief at the loss of her oldest daughter last year in October of 2021 Lacks caregiver support.  Non-adherence to prescribed medication regimen 03-08-2021: New onset of cellulitis to left leg first seen in ER on 02-26-2021, follow up with pcp on 03-08-2021  RNCM Clinical Goal(s):  Patient will verbalize understanding of plan for management of HTN, HLD, DMII, Anxiety, Depression,  falls prevention, and Chronic pain  verbalize basic understanding of HLD, DMII, Anxiety, Depression, falls prevention, and Chronic pain  disease process and self health management plan  take all medications exactly as prescribed and will call provider for medication related questions demonstrate  understanding of rationale for each prescribed medication patient desires to work with pharm D for effective management of chronic conditions and how to take her medications as prescribed demonstrate improved and ongoing adherence to prescribed treatment plan for HTN, HLD, DMII, Anxiety, Depression, falls prevention, and chronic pain  as evidenced by adherence to ADA/ carb modified diet adherence to prescribed medication regimen contacting provider for new or worsened symptoms or questions and working with the CCM team for effective management of chronic diseases  demonstrate improved and ongoing health management independence by remaining safe in home environment and effective management of Chronic conditions  continue to work with Consulting civil engineer to address care management and care coordination needs related to HLD, DMII, Anxiety, Depression, and chronic pain and fall prevention   work with pharmacist to address medication concerns as the patient admits she does not take her medications as prescribed and struggles with managing her medications related to HTN, HLD, DMII, Anxiety, Depression, and chronic pain and falls prevention and safety through collaboration with RN Care manager, provider, and care team.   Interventions: 1:1 collaboration with primary care provider regarding development and update of comprehensive plan of care as evidenced by provider attestation and co-signature Inter-disciplinary care team collaboration (see longitudinal plan of care) Evaluation of current treatment plan related to  self management and patient's adherence to plan as established by provider   SDOH Barriers (Status: Goal on track: YES.)  Patient interviewed and SDOH assessment performed  SDOH Interventions    Flowsheet Row Most Recent Value  SDOH Interventions   Food Insecurity Interventions Intervention Not Indicated  Intimate Partner Violence Interventions Intervention Not Indicated  Physical  Activity Interventions Other (Comments)  [no structured activities]  Social Connections Interventions Other (Comment)  [good support system, is moving soon]  Transportation Interventions Intervention Not Indicated     Patient interviewed and appropriate assessments performed Provided patient with information about CCM team and working with the patient to manage chronic conditions and effectively manage health and well being. The patient wants to be proactive in her care and is willing to work with the team Discussed plans with patient for ongoing care management follow up and provided patient with direct contact information for care management team Advised patient to call the office for changes in condition or SDOH needs. The patient states that she is hoping by working with the CCM team she can remember best how to take her medications and take care of herself beter.  Collaborated with RN Case Manager re: ongoing support and education for management of chronic conditions and health and well being goals. 03-08-2021: The patient has moved into a house wife her daughter in [place] and is doing very well. She had been out of town caring for her elderly father. She is doing well and in the office for a follow up today of left lower leg cellulitis    Diabetes:  (Status: Goal on track: YES.) Lab Results  Component Value Date   HGBA1C 5.7 (H) 09/09/2020   Assessed patient's understanding of A1c goal: <7% Provided education to patient about basic DM disease process; Reviewed prescribed diet with patient 11-16-2020: Review of heart healthy/ADA diet. The patient eats small meals and sometimes is not eating what she should. Sometimes eats a bedtime snack sometimes not. The patient encouraged to follow a heart healthy/ADA diet. Will send information by my chart system for review and help with meal choices and options. 11-30-2020: Is eating well and denies any issues with compliance with heart healthy/ADA  diet. 01-25-2021: Is in [place] taking care of her elderly father. She states she is actually doing very well right now. Denies any acute issues with management of DM or following dietary restrictions. 03-08-2021: The patient feels better and is happy that she is moved and into the new house with her daughter. The patient was in the office today for follow up of left lower leg cellulitis and states that her blood sugars are WNL and she is eating better. 04-05-2021: The patient states she is doing well and eating well. Denies any issues with her dietary intake; Counseled on importance of regular laboratory monitoring as prescribed. 04-05-2021: The patient keeps appointments and has regular lab work.         Discussed plans with patient for ongoing care management follow up and provided patient with direct contact information for care management team;      Provided patient with written educational materials related to hypo and hyperglycemia and importance of correct treatment. 11-16-2020: Reviewed with the patient the sx and sx of hypo and hyperglycemia. The patient does not check her blood sugars as recommended by the provider. She does have a meter but does not take medications for DM and mainly controls DM with diet. Education and support given. The patient states that she will start taking blood sugars if she feels different. Denies lows; however states that sometimes at night she experiences "sweating". Eduction and support given. Will continue to monitor.  11-30-2020: The patient denies any lows at this time. States her blood sugar this am was 109. States she is doing better with management of her overall health and well being. Will continue to monitor.   03-08-2021: Saw patient face to face and provided a calendar for her with places to record blood sugar and blood pressure readings. Also gave her the healthy eating handout for reference and review. The patient has the [facility] new telephone number for needs and  support between regular outreaches.  04-05-2021: The patient states her blood sugars are doing well. She denies any new concerns with her DM.     Review of patient status, including review of consultants reports, relevant laboratory and other test results, and medications completed;        Falls:  (Status: Goal on track: YES.) Provided written and verbal education re: potential causes of falls and Fall prevention strategies Reviewed medications and discussed potential side effects of medications such as dizziness and frequent urination. 11-16-2020: The patient reviewed with the RNCM medications that she takes. Is following up with the pharm D as she forgets to take medications. Will monitor for medications that may cause her to have issues with fall hazards. 01-25-2021: Working with the Pharm D for medications management, questions and concerns. 04-05-2021: Ongoing support and education for medication needs from pharm D and RNCM.  Advised patient of importance of notifying provider of falls Assessed for signs and symptoms of orthostatic hypotension. 9-19-202: Denies any issues with orthostatic hypotension. Takes blood pressures sometimes at home. Advised the patient to take blood pressures and record. 04-05-2021: Denies any low blood pressures at this time. Assessed for falls since last encounter. 11-16-2020: The patient states that she has a fall about once a month. Last fall earlier this month. States no injuries. The patient states that she uses a cane when ambulating but sometimes falls because she doesn't use her cane. She is always cooking and cleaning and doing for others. She states its easy for her not to take good care of herself. 04-05-2021: The patient denies any new falls. Review of safety concerns and being cautious to prevent falls and injuries.  Assessed patients knowledge of fall risk prevention secondary to previously provided education Advised patient to discuss new falls, concerns, or questions   with provider  Depression and anxiety   (Status: Goal on track: YES.) Evaluation of current treatment plan related to Anxiety and Depression, Limited social support, Level of care concerns, Mental Health Concerns , and Family and relationship dysfunction self-management and patient's adherence to plan as established by provider.  01-25-2021: The patient states that she is doing well right now. She is actually in [place] and she will be there until the end of December or later taking care of her elderly father. Review of medications and chronic conditions and the patient is doing well.  She states she is not stressed out and has her medications. Denies any new concerns. 03-08-2021: The patient states she is doing well. Her father is better and she is now home from [place]. She got completely moved into her daughters house this past weekend and she feels like this will be a positive and good change for her. She is resting well and getting her health under control. She denies any acute findings related to mental health today. 04-05-2021: The patient is doing well and happy. She states she will see the specialist for her back pain this week. The patient states she is happy with  new living arrangements. Discussed plans with patient for ongoing care management follow up and provided patient with direct contact information for care management team Advised patient to call the office for changes in mood, axiety, depression or other mental health concerns; Provided education to patient re: managing stressors in life and working with the CCM team to effectively manage her depression and anxiety ; Reviewed medications with patient and discussed compliance. The patient sometimes forgets to take her medications. Is open to ideas to help her with effective management of medications. 11-30-2020: Is doing better with management of her medications. Working with the pharm D and also setting alarms on her phone for 2 times a  day to help her remember to take her medications. 04-05-2021: Endorses compliance with medications. States she has her medications and denies any concerns with medications at this time. Will continue to monitor ; Collaborated with CCM team  regarding complex health needs and the patient wanting help in management of her chronic conditions. The patient states that her and her youngest daughter are going to be moving in about a month. She feels this will help her a whole lot as she is really stressed over cooking, cleaning and helping care for so many family members. She often feels overwhelmed and knows she is not taking care of herself. 11-30-2020: The patient is excited about moving in with her daughter. Her daughter closes on her new house on Friday and the patient will be moving in with her. She states she will still come and help some at her current home but it will be a lot less stressful on her. She is optimistic. Review of self care.  01-25-2021: The patient is doing well currently. Is in [place] taking care of her elderly father. Does not know when she will return back to [place]. Likely at the end of December. 04-05-2021; The patient is doing well and denies any issues with her mental health and well being. Has support from the CCM team and is thankful for the support and ongoing education Provided patient with depression and anxiety  educational materials related to relieving stressors in her life; Social Work referral for help with ongoing support and stress relief measures for effective management of mental health well beig ; Pharmacy referral for help with management of medications and help with system that will help her ; Discussed plans with patient for ongoing care management follow up and provided patient with direct contact information for care management team; Advised patient to discuss new changes and stressors  with provider; Screening for signs and symptoms of depression related to chronic  disease state;  Assessed social determinant of health barriers;   Hyperlipidemia:  (Status: Goal on track: YES.) Lab Results  Component Value Date   CHOL 133 09/09/2020   HDL 52 09/09/2020   LDLCALC 67 09/09/2020   TRIG 56 09/09/2020   CHOLHDL 2.6 09/09/2020     Medication review performed; medication list updated in electronic medical record. 04-05-2021: The patient is taking Lipitor 40 mg QD. Has no issues with medications compliance. Provider established cholesterol goals reviewed; Counseled on importance of regular laboratory monitoring as prescribed; Provided HLD educational materials; Reviewed role and benefits of statin for ASCVD risk reduction; Discussed strategies to manage statin-induced myalgias; Reviewed importance of limiting foods high in cholesterol. 04-05-2021: Education and handouts provided at today's visit for healthy eating with information on HLD, HTN, and heart health calendar. Reviewed exercise goals and target of 150 minutes per week;  Hypertension: (Status: Goal on Track (progressing): YES.) Long Term Goal  Last practice recorded BP readings:  BP Readings from Last 3 Encounters:  03/08/21 (!) 157/85  02/26/21 (!) 162/93  09/16/20 117/66  Most recent eGFR/CrCl:  Lab Results  Component Value Date   EGFR 93 09/09/2020    No components found for: CRCL  Evaluation of current treatment plan related to hypertension self management and patient's adherence to plan as established by provider. 03-08-2021; The patient states that she is doing well with her blood pressures at home and is not under the stress that she was with caring for her elderly father and other family members. The patient will elevation in the office, may be somewhat related to "white-coat" syndrome. Education and support given. 04-05-2021: The patient states that her blood pressures have been good and she has not had any high blood pressures.  Provided education to patient re: stroke prevention, s/s of  heart attack and stroke; Reviewed prescribed diet heart healthy/ADA diet  03-08-2021: Review and handout provided with educational information. 04-05-2021: The patient states that she is doing well with her dietary restrictions.  Reviewed medications with patient and discussed importance of compliance. 04-05-2021: The patient is compliant with medications  Discussed plans with patient for ongoing care management follow up and provided patient with direct contact information for care management team; Advised patient, providing education and rationale, to monitor blood pressure daily and record, calling PCP for findings outside established parameters;  Advised patient to discuss changes in blood pressures, excessive highs, questions, or concerns, with provider; Provided education on prescribed diet heart healthy/ADA ;  Discussed complications of poorly controlled blood pressure such as heart disease, stroke, circulatory complications, vision complications, kidney impairment, sexual dysfunction;     Pain:  (Status: Goal on track: YES.) Pain assessment performed. 04-05-2021: Rates her pain level at a 5 today in her back. The patient will see the specialist this week on Wednesday to evaluate her back pain and discomfort  Medications reviewed. 04-05-2021: Takes medications as directed Reviewed provider established plan for pain management. 11-16-2020: the patient states that she does take Tylenol for pain relief and discomfort at times. She stays very busy and she states she feels somewhat overwhelmed. She rates her pain today at a 4 on a scale of 0-10. 01-25-2021: Is effectively managing pain at this time. Denies any acute findings. Will continue to monitor. 03-08-2021: The patient has been dealing with cellulitis of left lower leg and was seen in ER on 02-26-2021, Urgent care on 03-01-2021 and pcp today. Per the patient the area is looing better and resolving. Will work with pcp for continued recommendations and support.  04-05-2021: The patient is going to the spine specialist this week to be evaluated for back pain on Wednesday. Called the Vein and Vascular specialist with the patient and secured the patient an appointment for March 6 at 0730 am with the specialist. The patient has not been there since 2021. She did see the neurologist about her RLS earlier in the month. She is thankful for the support of the CCM team.  Discussed importance of adherence to all scheduled medical appointments. 04-07-2021 with spine specialist. The patient has an appointment with the Vascular provider on 05-03-2021 at 730 am. Will call pcp for new changes or needs.  Knows to call for changes or needs.  Counseled on the importance of reporting any/all new or changed pain symptoms or management strategies to pain management provider; Advised patient to report to care  team affect of pain on daily activities; Discussed use of relaxation techniques and/or diversional activities to assist with pain reduction (distraction, imagery, relaxation, massage, acupressure, TENS, heat, and cold application; Reviewed with patient prescribed pharmacological and nonpharmacological pain relief strategies; Advised patient to discuss call the provider for changes in level or intensity of pain and discomfort with provider;   Left lower leg cellulitis   (Status: Goal on Track (progressing): YES.) Short Term Goal  Evaluation of current treatment plan related to  Left lower leg cellulitis ,  self-management and patient's adherence to plan as established by provider. 04-05-2021: The patient will see the vascular provider on May 03, 2021 at 41 for evaluation of bilateral statis dermatitis and venous insufficiency. The patient states the cellulitis is better in her left leg but still having issues with this and states she had called several times and left messages for them to call her back and schedule an appointment. RNCM called with the patient today to schedule an  appointment with the vein and vascular. Appointment secured for 05-03-2021 at 0730 am Discussed plans with patient for ongoing care management follow up and provided patient with direct contact information for care management team Advised patient to call the office for changes in cellulitis to left lower leg, drainage, sx and sx of infection, questions, or concerns; Provided education to patient re: monitoring for changes to site, monitoring for sx and sx of infection, hygiene and care to the infected area,; Reviewed medications with patient and discussed compliance and to take all antibiotics until course is done. ; Reviewed scheduled/upcoming provider appointments including saw pcp today in the office. 04-05-2021: Upcoming appointment at March 6 at 0730 am for evaluation at vein and vascular specialist; Discussed plans with patient for ongoing care management follow up and provided patient with direct contact information for care management team; Advised patient to discuss changes in the are of the cellulitis, questions, or concerns with provider;   Patient Goals/Self-Care Activities: Patient will self administer medications as prescribed Patient will attend all scheduled provider appointments Patient will call pharmacy for medication refills Patient will attend church or other social activities Patient will continue to perform ADL's independently Patient will continue to perform IADL's independently Patient will call provider office for new concerns or questions Patient will work with BSW to address care coordination needs and will continue to work with the clinical team to address health care and disease management related needs.         Plan:Telephone follow up appointment with care management team member scheduled for:  06-07-2021 at 1 pm  Noreene Larsson RN, MSN, [place] Mobile: [phone]

## 2021-04-05 NOTE — Patient Instructions (Signed)
Visit Information  Thank you for taking time to visit with me today. Please don't hesitate to contact me if I can be of assistance to you before our next scheduled telephone appointment.  Following are the goals we discussed today:  RNCM Clinical Goal(s):  Patient will verbalize understanding of plan for management of HTN, HLD, DMII, Anxiety, Depression,  falls prevention, and Chronic pain  verbalize basic understanding of HLD, DMII, Anxiety, Depression, falls prevention, and Chronic pain  disease process and self health management plan  take all medications exactly as prescribed and will call provider for medication related questions demonstrate understanding of rationale for each prescribed medication patient desires to work with pharm D for effective management of chronic conditions and how to take her medications as prescribed demonstrate improved and ongoing adherence to prescribed treatment plan for HTN, HLD, DMII, Anxiety, Depression, falls prevention, and chronic pain  as evidenced by adherence to ADA/ carb modified diet adherence to prescribed medication regimen contacting provider for new or worsened symptoms or questions and working with the CCM team for effective management of chronic diseases  demonstrate improved and ongoing health management independence by remaining safe in home environment and effective management of Chronic conditions  continue to work with Consulting civil engineer to address care management and care coordination needs related to HLD, DMII, Anxiety, Depression, and chronic pain and fall prevention   work with pharmacist to address medication concerns as the patient admits she does not take her medications as prescribed and struggles with managing her medications related to HTN, HLD, DMII, Anxiety, Depression, and chronic pain and falls prevention and safety through collaboration with RN Care manager, provider, and care team.    Interventions: 1:1 collaboration with primary  care provider regarding development and update of comprehensive plan of care as evidenced by provider attestation and co-signature Inter-disciplinary care team collaboration (see longitudinal plan of care) Evaluation of current treatment plan related to  self management and patient's adherence to plan as established by provider     SDOH Barriers (Status: Goal on track: YES.)  Patient interviewed and SDOH assessment performed        SDOH Interventions     Flowsheet Row Most Recent Value  SDOH Interventions    Food Insecurity Interventions Intervention Not Indicated  Intimate Partner Violence Interventions Intervention Not Indicated  Physical Activity Interventions Other (Comments)  [no structured activities]  Social Connections Interventions Other (Comment)  [good support system, is moving soon]  Transportation Interventions Intervention Not Indicated       Patient interviewed and appropriate assessments performed Provided patient with information about CCM team and working with the patient to manage chronic conditions and effectively manage health and well being. The patient wants to be proactive in her care and is willing to work with the team Discussed plans with patient for ongoing care management follow up and provided patient with direct contact information for care management team Advised patient to call the office for changes in condition or SDOH needs. The patient states that she is hoping by working with the CCM team she can remember best how to take her medications and take care of herself beter.  Collaborated with RN Case Manager re: ongoing support and education for management of chronic conditions and health and well being goals. 03-08-2021: The patient has moved into a house wife her daughter in [place] and is doing very well. She had been out of town caring for her elderly father. She is doing well and in the  office for a follow up today of left lower leg cellulitis        Diabetes:  (Status: Goal on track: YES.)      Lab Results  Component Value Date    HGBA1C 5.7 (H) 09/09/2020    Assessed patient's understanding of A1c goal: <7% Provided education to patient about basic DM disease process; Reviewed prescribed diet with patient 11-16-2020: Review of heart healthy/ADA diet. The patient eats small meals and sometimes is not eating what she should. Sometimes eats a bedtime snack sometimes not. The patient encouraged to follow a heart healthy/ADA diet. Will send information by my chart system for review and help with meal choices and options. 11-30-2020: Is eating well and denies any issues with compliance with heart healthy/ADA diet. 01-25-2021: Is in [place] taking care of her elderly father. She states she is actually doing very well right now. Denies any acute issues with management of DM or following dietary restrictions. 03-08-2021: The patient feels better and is happy that she is moved and into the new house with her daughter. The patient was in the office today for follow up of left lower leg cellulitis and states that her blood sugars are WNL and she is eating better. 04-05-2021: The patient states she is doing well and eating well. Denies any issues with her dietary intake; Counseled on importance of regular laboratory monitoring as prescribed. 04-05-2021: The patient keeps appointments and has regular lab work.         Discussed plans with patient for ongoing care management follow up and provided patient with direct contact information for care management team;      Provided patient with written educational materials related to hypo and hyperglycemia and importance of correct treatment. 11-16-2020: Reviewed with the patient the sx and sx of hypo and hyperglycemia. The patient does not check her blood sugars as recommended by the provider. She does have a meter but does not take medications for DM and mainly controls DM with diet. Education and support given. The  patient states that she will start taking blood sugars if she feels different. Denies lows; however states that sometimes at night she experiences "sweating". Eduction and support given. Will continue to monitor. 11-30-2020: The patient denies any lows at this time. States her blood sugar this am was 109. States she is doing better with management of her overall health and well being. Will continue to monitor.   03-08-2021: Saw patient face to face and provided a calendar for her with places to record blood sugar and blood pressure readings. Also gave her the healthy eating handout for reference and review. The patient has the [facility] new telephone number for needs and support between regular outreaches.  04-05-2021: The patient states her blood sugars are doing well. She denies any new concerns with her DM.     Review of patient status, including review of consultants reports, relevant laboratory and other test results, and medications completed;         Falls:  (Status: Goal on track: YES.) Provided written and verbal education re: potential causes of falls and Fall prevention strategies Reviewed medications and discussed potential side effects of medications such as dizziness and frequent urination. 11-16-2020: The patient reviewed with the RNCM medications that she takes. Is following up with the pharm D as she forgets to take medications. Will monitor for medications that may cause her to have issues with fall hazards. 01-25-2021: Working with the Pharm D for medications management, questions  and concerns. 04-05-2021: Ongoing support and education for medication needs from pharm D and RNCM.  Advised patient of importance of notifying provider of falls Assessed for signs and symptoms of orthostatic hypotension. 9-19-202: Denies any issues with orthostatic hypotension. Takes blood pressures sometimes at home. Advised the patient to take blood pressures and record. 04-05-2021: Denies any low blood pressures at this  time. Assessed for falls since last encounter. 11-16-2020: The patient states that she has a fall about once a month. Last fall earlier this month. States no injuries. The patient states that she uses a cane when ambulating but sometimes falls because she doesn't use her cane. She is always cooking and cleaning and doing for others. She states its easy for her not to take good care of herself. 04-05-2021: The patient denies any new falls. Review of safety concerns and being cautious to prevent falls and injuries.  Assessed patients knowledge of fall risk prevention secondary to previously provided education Advised patient to discuss new falls, concerns, or questions  with provider   Depression and anxiety   (Status: Goal on track: YES.) Evaluation of current treatment plan related to Anxiety and Depression, Limited social support, Level of care concerns, Mental Health Concerns , and Family and relationship dysfunction self-management and patient's adherence to plan as established by provider.  01-25-2021: The patient states that she is doing well right now. She is actually in [place] and she will be there until the end of December or later taking care of her elderly father. Review of medications and chronic conditions and the patient is doing well.  She states she is not stressed out and has her medications. Denies any new concerns. 03-08-2021: The patient states she is doing well. Her father is better and she is now home from [place]. She got completely moved into her daughters house this past weekend and she feels like this will be a positive and good change for her. She is resting well and getting her health under control. She denies any acute findings related to mental health today. 04-05-2021: The patient is doing well and happy. She states she will see the specialist for her back pain this week. The patient states she is happy with new living arrangements. Discussed plans with patient for ongoing care  management follow up and provided patient with direct contact information for care management team Advised patient to call the office for changes in mood, axiety, depression or other mental health concerns; Provided education to patient re: managing stressors in life and working with the CCM team to effectively manage her depression and anxiety ; Reviewed medications with patient and discussed compliance. The patient sometimes forgets to take her medications. Is open to ideas to help her with effective management of medications. 11-30-2020: Is doing better with management of her medications. Working with the pharm D and also setting alarms on her phone for 2 times a day to help her remember to take her medications. 04-05-2021: Endorses compliance with medications. States she has her medications and denies any concerns with medications at this time. Will continue to monitor ; Collaborated with CCM team  regarding complex health needs and the patient wanting help in management of her chronic conditions. The patient states that her and her youngest daughter are going to be moving in about a month. She feels this will help her a whole lot as she is really stressed over cooking, cleaning and helping care for so many family members. She often feels overwhelmed  and knows she is not taking care of herself. 11-30-2020: The patient is excited about moving in with her daughter. Her daughter closes on her new house on Friday and the patient will be moving in with her. She states she will still come and help some at her current home but it will be a lot less stressful on her. She is optimistic. Review of self care.  01-25-2021: The patient is doing well currently. Is in [place] taking care of her elderly father. Does not know when she will return back to [place]. Likely at the end of December. 04-05-2021; The patient is doing well and denies any issues with her mental health and well being. Has support from the CCM team and is  thankful for the support and ongoing education Provided patient with depression and anxiety  educational materials related to relieving stressors in her life; Social Work referral for help with ongoing support and stress relief measures for effective management of mental health well beig ; Pharmacy referral for help with management of medications and help with system that will help her ; Discussed plans with patient for ongoing care management follow up and provided patient with direct contact information for care management team; Advised patient to discuss new changes and stressors  with provider; Screening for signs and symptoms of depression related to chronic disease state;  Assessed social determinant of health barriers;    Hyperlipidemia:  (Status: Goal on track: YES.)      Lab Results  Component Value Date    CHOL 133 09/09/2020    HDL 52 09/09/2020    LDLCALC 67 09/09/2020    TRIG 56 09/09/2020    CHOLHDL 2.6 09/09/2020      Medication review performed; medication list updated in electronic medical record. 04-05-2021: The patient is taking Lipitor 40 mg QD. Has no issues with medications compliance. Provider established cholesterol goals reviewed; Counseled on importance of regular laboratory monitoring as prescribed; Provided HLD educational materials; Reviewed role and benefits of statin for ASCVD risk reduction; Discussed strategies to manage statin-induced myalgias; Reviewed importance of limiting foods high in cholesterol. 04-05-2021: Education and handouts provided at today's visit for healthy eating with information on HLD, HTN, and heart health calendar. Reviewed exercise goals and target of 150 minutes per week;       Hypertension: (Status: Goal on Track (progressing): YES.) Long Term Goal  Last practice recorded BP readings:     BP Readings from Last 3 Encounters:  03/08/21 (!) 157/85  02/26/21 (!) 162/93  09/16/20 117/66  Most recent eGFR/CrCl:       Lab Results   Component Value Date    EGFR 93 09/09/2020    No components found for: CRCL   Evaluation of current treatment plan related to hypertension self management and patient's adherence to plan as established by provider. 03-08-2021; The patient states that she is doing well with her blood pressures at home and is not under the stress that she was with caring for her elderly father and other family members. The patient will elevation in the office, may be somewhat related to "white-coat" syndrome. Education and support given. 04-05-2021: The patient states that her blood pressures have been good and she has not had any high blood pressures.  Provided education to patient re: stroke prevention, s/s of heart attack and stroke; Reviewed prescribed diet heart healthy/ADA diet  03-08-2021: Review and handout provided with educational information. 04-05-2021: The patient states that she is doing well with her  dietary restrictions.  Reviewed medications with patient and discussed importance of compliance. 04-05-2021: The patient is compliant with medications  Discussed plans with patient for ongoing care management follow up and provided patient with direct contact information for care management team; Advised patient, providing education and rationale, to monitor blood pressure daily and record, calling PCP for findings outside established parameters;  Advised patient to discuss changes in blood pressures, excessive highs, questions, or concerns, with provider; Provided education on prescribed diet heart healthy/ADA ;  Discussed complications of poorly controlled blood pressure such as heart disease, stroke, circulatory complications, vision complications, kidney impairment, sexual dysfunction;       Pain:  (Status: Goal on track: YES.) Pain assessment performed. 04-05-2021: Rates her pain level at a 5 today in her back. The patient will see the specialist this week on Wednesday to evaluate her back pain and discomfort   Medications reviewed. 04-05-2021: Takes medications as directed Reviewed provider established plan for pain management. 11-16-2020: the patient states that she does take Tylenol for pain relief and discomfort at times. She stays very busy and she states she feels somewhat overwhelmed. She rates her pain today at a 4 on a scale of 0-10. 01-25-2021: Is effectively managing pain at this time. Denies any acute findings. Will continue to monitor. 03-08-2021: The patient has been dealing with cellulitis of left lower leg and was seen in ER on 02-26-2021, Urgent care on 03-01-2021 and pcp today. Per the patient the area is looing better and resolving. Will work with pcp for continued recommendations and support. 04-05-2021: The patient is going to the spine specialist this week to be evaluated for back pain on Wednesday. Called the Vein and Vascular specialist with the patient and secured the patient an appointment for March 6 at 0730 am with the specialist. The patient has not been there since 2021. She did see the neurologist about her RLS earlier in the month. She is thankful for the support of the CCM team.  Discussed importance of adherence to all scheduled medical appointments. 04-07-2021 with spine specialist. The patient has an appointment with the Vascular provider on 05-03-2021 at 730 am. Will call pcp for new changes or needs.  Knows to call for changes or needs.  Counseled on the importance of reporting any/all new or changed pain symptoms or management strategies to pain management provider; Advised patient to report to care team affect of pain on daily activities; Discussed use of relaxation techniques and/or diversional activities to assist with pain reduction (distraction, imagery, relaxation, massage, acupressure, TENS, heat, and cold application; Reviewed with patient prescribed pharmacological and nonpharmacological pain relief strategies; Advised patient to discuss call the provider for changes in level or  intensity of pain and discomfort with provider;     Left lower leg cellulitis   (Status: Goal on Track (progressing): YES.) Short Term Goal  Evaluation of current treatment plan related to  Left lower leg cellulitis ,  self-management and patient's adherence to plan as established by provider. 04-05-2021: The patient will see the vascular provider on May 03, 2021 at 6 for evaluation of bilateral statis dermatitis and venous insufficiency. The patient states the cellulitis is better in her left leg but still having issues with this and states she had called several times and left messages for them to call her back and schedule an appointment. RNCM called with the patient today to schedule an appointment with the vein and vascular. Appointment secured for 05-03-2021 at 0730 am Discussed plans with  patient for ongoing care management follow up and provided patient with direct contact information for care management team Advised patient to call the office for changes in cellulitis to left lower leg, drainage, sx and sx of infection, questions, or concerns; Provided education to patient re: monitoring for changes to site, monitoring for sx and sx of infection, hygiene and care to the infected area,; Reviewed medications with patient and discussed compliance and to take all antibiotics until course is done. ; Reviewed scheduled/upcoming provider appointments including saw pcp today in the office. 04-05-2021: Upcoming appointment at March 6 at 0730 am for evaluation at vein and vascular specialist; Discussed plans with patient for ongoing care management follow up and provided patient with direct contact information for care management team; Advised patient to discuss changes in the are of the cellulitis, questions, or concerns with provider;    Patient Goals/Self-Care Activities: Patient will self administer medications as prescribed Patient will attend all scheduled provider appointments Patient will call  pharmacy for medication refills Patient will attend church or other social activities Patient will continue to perform ADL's independently Patient will continue to perform IADL's independently Patient will call provider office for new concerns or questions Patient will work with BSW to address care coordination needs and will continue to work with the clinical team to address health care and disease management related needs.      Our next appointment is by telephone on 05-03-2021 at 0730 am   Please call the care guide team at [phone] if you need to cancel or reschedule your appointment.   If you are experiencing a Mental Health or [place] or need someone to talk to, please call the Suicide and Crisis Lifeline: 988 call the [place] National Suicide Prevention Lifeline: (574) 151-2109 or TTY: 708-424-6518 TTY (715)048-9024) to talk to a trained counselor call 1-800-273-TALK (toll free, 24 hour hotline)   Patient verbalizes understanding of instructions and care plan provided today and agrees to view in [place]. Active MyChart status confirmed with patient.    Noreene Larsson RN, MSN, [place] Mobile: [phone]

## 2021-04-07 DIAGNOSIS — M5416 Radiculopathy, lumbar region: Secondary | ICD-10-CM | POA: Diagnosis not present

## 2021-04-07 DIAGNOSIS — M48062 Spinal stenosis, lumbar region with neurogenic claudication: Secondary | ICD-10-CM | POA: Diagnosis not present

## 2021-04-07 DIAGNOSIS — M5136 Other intervertebral disc degeneration, lumbar region: Secondary | ICD-10-CM | POA: Diagnosis not present

## 2021-04-12 DIAGNOSIS — M79605 Pain in left leg: Secondary | ICD-10-CM | POA: Diagnosis not present

## 2021-04-12 DIAGNOSIS — R2689 Other abnormalities of gait and mobility: Secondary | ICD-10-CM | POA: Diagnosis not present

## 2021-04-12 DIAGNOSIS — M5459 Other low back pain: Secondary | ICD-10-CM | POA: Diagnosis not present

## 2021-04-21 ENCOUNTER — Ambulatory Visit: Payer: Medicare Other | Admitting: Pharmacist

## 2021-04-21 DIAGNOSIS — I10 Essential (primary) hypertension: Secondary | ICD-10-CM

## 2021-04-21 NOTE — Patient Instructions (Signed)
Visit Information  Thank you for taking time to visit with me today. Please don't hesitate to contact me if I can be of assistance to you before our next scheduled telephone appointment.  Following are the goals we discussed today:   Goals Addressed             This Visit's Progress    Pharmacy Goals       Please check your home blood pressure, keep a log of the results and bring this with you to your medical appointments.  Feel free to call me with any questions or concerns. I look forward to our next call!  Wallace Cullens, PharmD, [place], CPP Clinical Pharmacist [facility] [phone]        Please call the care guide team at [phone] if you need to cancel or reschedule your appointment.    Patient verbalizes understanding of instructions and care plan provided today and agrees to view in [place]. Active MyChart status confirmed with patient.

## 2021-04-21 NOTE — Chronic Care Management (AMB) (Signed)
Chronic Care Management CCM Pharmacy Note  04/21/2021 Name:  Janet [name] MRN:  [id number] DOB:  [birth year]   Subjective: Janet [name] is an 71 y.o. year old female who is a primary patient of Smitty Cords, DO.  The CCM team was consulted for assistance with disease management and care coordination needs.    Engaged with patient by telephone for follow up visit for pharmacy case management and/or care coordination services.   Objective:  Medications Reviewed Today     Reviewed by Marlowe Sax, RN (Case Manager) on 04/05/21 at 1350  Med List Status: <None>   Medication Order Taking? Sig Documenting Provider Last Dose Status Informant  aspirin 81 MG EC tablet [id number] No Take 81 mg by mouth daily. [provider] Taking Active   atorvastatin (LIPITOR) 40 MG tablet [id number] No Take 40 mg by mouth at bedtime. [provider] Taking Active   baclofen (LIORESAL) 10 MG tablet [id number] No Take 1 tablet (10 mg total) by mouth at bedtime as needed for muscle spasms. Smitty Cords, DO Taking Active   DULoxetine (CYMBALTA) 30 MG capsule [id number] No Take 1 capsule (30 mg total) by mouth daily. Smitty Cords, DO Taking Active   gabapentin (NEURONTIN) 300 MG capsule [id number] No Take 300 mg by mouth 3 (three) times daily. [provider] Taking Active            Med Note [name], [facility] A   Fri Dec 04, 2020  9:37 AM) Taking 1 capsule (300 mg) in the morning and 2 capsules  (600 mg) at night as directed by Neurologist.  losartan-hydrochlorothiazide (HYZAAR) 100-25 MG tablet [id number] No Take 1 tablet by mouth daily. Smitty Cords, DO Taking Active   meloxicam (MOBIC) 15 MG tablet [id number] No Take 15 mg by mouth daily as needed.  Patient not taking: Reported on 03/22/2021   [provider] Not Taking Active   pramipexole (MIRAPEX) 1 MG tablet [id number] No Take 1 tablet (1 mg total) by mouth 2 (two) times daily.  Smitty Cords, DO Taking Active             Pertinent Labs:  Lab Results  Component Value Date   HGBA1C 5.7 (H) 09/09/2020   Lab Results  Component Value Date   CHOL 133 09/09/2020   HDL 52 09/09/2020   LDLCALC 67 09/09/2020   TRIG 56 09/09/2020   CHOLHDL 2.6 09/09/2020   Lab Results  Component Value Date   CREATININE 0.71 09/09/2020   BUN 26 (H) 09/09/2020   NA 142 09/09/2020   K 4.4 09/09/2020   CL 105 09/09/2020   CO2 29 09/09/2020   BP Readings from Last 3 Encounters:  03/08/21 (!) 157/85  02/26/21 (!) 162/93  09/16/20 117/66   Pulse Readings from Last 3 Encounters:  03/08/21 80  02/26/21 83  09/16/20 69     SDOH:  (Social Determinants of Health) assessments and interventions performed:    CCM Care Plan  Review of patient past medical history, allergies, medications, health status, including review of consultants reports, laboratory and other test data, was performed as part of comprehensive evaluation and provision of chronic care management services.   Care Plan : PharmD - Medication Adherence  Updates made by Manuela Neptune, RPH-CPP since 04/21/2021 12:00 AM     Problem: Disease Progression      Long-Range Goal: Disease Progression Prevented or Minimized   Start Date: 11/27/2020  Expected End Date: 02/25/2021  Recent  Progress: On track  Priority: High  Note:   Current Barriers:  Unable to self administer medications as prescribed  Pharmacist Clinical Goal(s):  Over the next 90 days, patient will achieve adherence to monitoring guidelines and medication adherence to achieve therapeutic efficacy through collaboration with PharmD and provider.    Interventions: 1:1 collaboration with Smitty Cords, DO regarding development and update of comprehensive plan of care as evidenced by provider attestation and co-signature Inter-disciplinary care team collaboration (see longitudinal plan of care) Perform chart review.   Patient seen for Office Visit with [facility] on 04/07/2021. Provider advised patient: Continue with gabapentin 300mg  TID, Cymbalta 30mg  daily, baclofen 10mg  as needed (typically once per week) [facility]one 5 mg 6-day taper sent to pharmacy. Office Visit with [facility] on 03/31/2021 Refill taking Gabapentin 300, 300, 900 Continue taking Mirapex 1 mg twice a day, and Cymbalta. Today patient reports she is currently in as father was ill and now is hospitalized. She is unsure of when she will return home Patient notes she has appointment scheduled with [facility] on 3/6 and hopes to be back by then, but if not plans to call to reschedule Will collaborate with CCM team  Medication adherence: Confirms brought all of her medications with her and using weekly pillbox   Hypertension: Current treatment: losartan-HCTZ 100-25 mg daily Have encouraged patient to continue monitoring home BP and HR 1-2 times week and to keep a log of the results  Type 2 Diabetes: Controlled: current treatment: none  Hyperlipidemia: Controlled: current treatment: atorvastatin 40 mg daily  Patient Goals/Self-Care Activities Over the next 90 days, patient will:  - focus on medication adherence by:   Using weekly pillbox  Follow Up Plan: The care management team will reach out to the patient again over the next 30 days.         [facility], PharmD, 05/29/2021, CPP Clinical Pharmacist [facility] [phone]

## 2021-04-27 DIAGNOSIS — F3342 Major depressive disorder, recurrent, in full remission: Secondary | ICD-10-CM

## 2021-04-27 DIAGNOSIS — E1169 Type 2 diabetes mellitus with other specified complication: Secondary | ICD-10-CM

## 2021-04-27 DIAGNOSIS — E785 Hyperlipidemia, unspecified: Secondary | ICD-10-CM | POA: Diagnosis not present

## 2021-04-27 DIAGNOSIS — M1712 Unilateral primary osteoarthritis, left knee: Secondary | ICD-10-CM | POA: Diagnosis not present

## 2021-04-27 DIAGNOSIS — I10 Essential (primary) hypertension: Secondary | ICD-10-CM

## 2021-04-27 DIAGNOSIS — F331 Major depressive disorder, recurrent, moderate: Secondary | ICD-10-CM

## 2021-04-27 DIAGNOSIS — M159 Polyosteoarthritis, unspecified: Secondary | ICD-10-CM

## 2021-05-03 ENCOUNTER — Ambulatory Visit (INDEPENDENT_AMBULATORY_CARE_PROVIDER_SITE_OTHER): Payer: Medicare Other

## 2021-05-03 ENCOUNTER — Ambulatory Visit (INDEPENDENT_AMBULATORY_CARE_PROVIDER_SITE_OTHER): Payer: Medicare Other | Admitting: Nurse Practitioner

## 2021-05-03 ENCOUNTER — Other Ambulatory Visit: Payer: Self-pay

## 2021-05-03 ENCOUNTER — Encounter (INDEPENDENT_AMBULATORY_CARE_PROVIDER_SITE_OTHER): Payer: Self-pay | Admitting: Nurse Practitioner

## 2021-05-03 ENCOUNTER — Other Ambulatory Visit (INDEPENDENT_AMBULATORY_CARE_PROVIDER_SITE_OTHER): Payer: Self-pay | Admitting: Nurse Practitioner

## 2021-05-03 VITALS — BP 147/85 | HR 69 | Resp 16 | Wt 218.8 lb

## 2021-05-03 DIAGNOSIS — M5459 Other low back pain: Secondary | ICD-10-CM | POA: Diagnosis not present

## 2021-05-03 DIAGNOSIS — E1169 Type 2 diabetes mellitus with other specified complication: Secondary | ICD-10-CM

## 2021-05-03 DIAGNOSIS — R2689 Other abnormalities of gait and mobility: Secondary | ICD-10-CM | POA: Diagnosis not present

## 2021-05-03 DIAGNOSIS — I872 Venous insufficiency (chronic) (peripheral): Secondary | ICD-10-CM

## 2021-05-03 DIAGNOSIS — M79605 Pain in left leg: Secondary | ICD-10-CM | POA: Diagnosis not present

## 2021-05-03 DIAGNOSIS — I83813 Varicose veins of bilateral lower extremities with pain: Secondary | ICD-10-CM | POA: Diagnosis not present

## 2021-05-03 NOTE — Progress Notes (Signed)
? ?Subjective:  ? ? Patient ID: Janet [name], female    DOB: 1950-10-16, 71 y.o.   MRN: [id number] ?Chief Complaint  ?Patient presents with  ? Follow-up  ?  Ultrasound follow up  ? ? ?Janet [name] is a 72 year old female that returns today for follow-up evaluation of lower extremity varicosities.  We previously saw the patient 2021.  At that time she was to follow-up with an ultrasound however due to [employer] and other issues the patient is just now able to follow-up.  She notes burning and stinging in her lower extremities.  She does have a history of restless leg syndrome.  The patient has previously had endovenous ablation on her lower extremities.  She denies any open wounds or ulcerations.  She continues to wear medical grade compression socks as well as elevation and activity.  Despite these conservative measures she continues to have pain. ? ?Today noninvasive study showed no evidence of DVT or superficial thrombophlebitis bilaterally.  No evidence of deep venous insufficiency bilaterally.  There is a small area of reflux in the left proximal calf  ? ? ?Review of Systems  ?Musculoskeletal:  Positive for arthralgias.  ?All other systems reviewed and are negative. ? ?   ?Objective:  ? Physical Exam ?Vitals reviewed.  ?HENT:  ?   Head: Normocephalic.  ?Cardiovascular:  ?   Rate and Rhythm: Normal rate.  ?   Pulses: Normal pulses.  ?Pulmonary:  ?   Effort: Pulmonary effort is normal.  ?Skin: ?   General: Skin is warm and dry.  ?Neurological:  ?   Mental Status: She is alert and oriented to person, place, and time.  ?Psychiatric:     ?   Mood and Affect: Mood normal.     ?   Behavior: Behavior normal.     ?   Thought Content: Thought content normal.     ?   Judgment: Judgment normal.  ? ? ?BP (!) 147/85 (BP Location: Left Arm)   Pulse 69   Resp 16   Wt 218 lb 12.8 oz (99.2 kg)   BMI 40.02 kg/m?  ? ?Past Medical History:  ?Diagnosis Date  ? GERD (gastroesophageal reflux disease)   ? Heart murmur   ? Hyperlipidemia   ?  Hypertension   ? PAD (peripheral artery disease) ([place])   ? Restless leg   ? Sleep apnea   ? Urinary incontinence   ? ? ?Social History  ? ?Socioeconomic History  ? Marital status: Married  ?  Spouse name: Not on file  ? Number of children: Not on file  ? Years of education: Not on file  ? Highest education level: Not on file  ?Occupational History  ? Not on file  ?Tobacco Use  ? Smoking status: Former  ?  Packs/day: 1.00  ?  Years: 25.00  ?  Pack years: 25.00  ?  Types: Cigarettes  ? Smokeless tobacco: Former  ?Vaping Use  ? Vaping Use: Never used  ?Substance and Sexual Activity  ? Alcohol use: Never  ? Drug use: Never  ? Sexual activity: Not Currently  ?Other Topics Concern  ? Not on file  ?Social History Narrative  ? Not on file  ? ?Social Determinants of Health  ? ?Financial Resource Strain: Low Risk   ? Difficulty of Paying Living Expenses: Not hard at all  ?Food Insecurity: No Food Insecurity  ? Worried About Charity fundraiser in the Last Year: Never true  ? Ran Out of  Food in the Last Year: Never true  ?Transportation Needs: No Transportation Needs  ? Lack of Transportation (Medical): No  ? Lack of Transportation (Non-Medical): No  ?Physical Activity: Inactive  ? Days of Exercise per Week: 0 days  ? Minutes of Exercise per Session: 0 min  ?Stress: No Stress Concern Present  ? Feeling of Stress : Not at all  ?Social Connections: Moderately Isolated  ? Frequency of Communication with Friends and Family: More than three times a week  ? Frequency of Social Gatherings with Friends and Family: More than three times a week  ? Attends Religious Services: Never  ? Active Member of Clubs or Organizations: No  ? Attends Archivist Meetings: Never  ? Marital Status: Married  ?Intimate Partner Violence: Not At Risk  ? Fear of Current or Ex-Partner: No  ? Emotionally Abused: No  ? Physically Abused: No  ? Sexually Abused: No  ? ? ?Past Surgical History:  ?Procedure Laterality Date  ? APPENDECTOMY  2014  ? CARPAL  TUNNEL RELEASE Left 1998  ? DG C-ARM 2 VIEW LEFT SHOULDER ([facility] HX)    ? DG C-ARM 2 VIEW RIGHT SHOULDER ([facility] HX)    ? ESOPHAGEAL DILATION  2012  ? 2015, 2017, 2020  ? GASTRIC BYPASS  2019  ? HERNIA REPAIR  2019  ? left knee manipulation Left 02/05/2019  ? PACEMAKER IMPLANT  2017  ? POLYPECTOMY    ? SHOULDER SURGERY Left 2014  ? SHOULDER SURGERY Right 2017  ? TOTAL KNEE ARTHROPLASTY Right 2018  ? TOTAL KNEE ARTHROPLASTY Left 10/18/2018  ? vein removal Bilateral 2012  ? ? ?Family History  ?Problem Relation Age of Onset  ? Heart attack Mother 8  ? Heart disease Father   ? Thyroid disease Father   ? Skin cancer Father   ? Brain cancer Maternal Grandmother   ? ? ?No Known Allergies ? ?CBC Latest Ref Rng & Units 02/26/2021 09/09/2020 09/12/2019  ?WBC 4.0 - 10.5 K/uL 6.6 7.7 7.6  ?Hemoglobin 12.0 - 15.0 g/dL 12.1 12.7 13.7  ?Hematocrit 36.0 - 46.0 % 38.7 39.0 42.7  ?Platelets 150 - 400 K/uL 245 315 265  ? ? ? ? ?CMP  ?   ?Component Value Date/Time  ? NA 142 09/09/2020 0832  ? K 4.4 09/09/2020 0832  ? CL 105 09/09/2020 0832  ? CO2 29 09/09/2020 0832  ? GLUCOSE 105 (H) 09/09/2020 [id number]  ? BUN 26 (H) 09/09/2020 [id number]  ? CREATININE 0.71 09/09/2020 0832  ? CALCIUM 9.4 09/09/2020 0832  ? PROT 6.8 09/09/2020 0832  ? AST 16 09/09/2020 0832  ? ALT 14 09/09/2020 0832  ? BILITOT 0.9 09/09/2020 0832  ? GFRNONAA 93 09/12/2019 0803  ? GFRAA 107 09/12/2019 0803  ? ? ? ?No results found. ? ?   ?Assessment & Plan:  ? ?1. Varicose veins of bilateral lower extremities with pain ?Recommend: ? ?The patient has had successful ablation of the previously incompetent saphenous venous system but still has persistent symptoms of pain and swelling that are having a negative impact on daily life and daily activities. ? ?Patient should undergo injection sclerotherapy to treat the residual varicosities. ? ?The risks, benefits and alternative therapies were reviewed in detail with the patient.  All questions were answered.  The patient agrees to proceed  with sclerotherapy at their convenience. ? ?The patient will continue wearing the graduated compression stockings and using the over-the-counter pain medications to treat her symptoms.  ? ?   ? ?  2. Type 2 diabetes mellitus with other specified complication, without long-term current use of insulin ([place]) ?Continue hypoglycemic medications as already ordered, these medications have been reviewed and there are no changes at this time. ? ?Hgb A1C to be monitored as already arranged by primary service  ? ?Some of the patient's symptoms are somewhat concerning for possible neuropathy.  If treatment for varicosities does not help the discomfort she is feeling she will need to discuss with PCP for further work-up. ?Current Outpatient Medications on File Prior to Visit  ?Medication Sig Dispense Refill  ? aspirin 81 MG EC tablet Take 81 mg by mouth daily.    ? atorvastatin (LIPITOR) 40 MG tablet Take 40 mg by mouth at bedtime.    ? baclofen (LIORESAL) 10 MG tablet Take 1 tablet (10 mg total) by mouth at bedtime as needed for muscle spasms. 30 each 2  ? DULoxetine (CYMBALTA) 30 MG capsule Take 1 capsule (30 mg total) by mouth daily. 90 capsule 3  ? gabapentin (NEURONTIN) 300 MG capsule Take 300 mg by mouth 3 (three) times daily.    ? losartan-hydrochlorothiazide (HYZAAR) 100-25 MG tablet Take 1 tablet by mouth daily. 90 tablet 3  ? pramipexole (MIRAPEX) 1 MG tablet Take 1 tablet (1 mg total) by mouth 2 (two) times daily. 180 tablet 3  ? meloxicam (MOBIC) 15 MG tablet Take 15 mg by mouth daily as needed. (Patient not taking: Reported on 03/22/2021)    ? ?No current facility-administered medications on file prior to visit.  ? ? ?There are no Patient Instructions on file for this visit. ?No follow-ups on file. ? ? ?Kris Hartmann, NP ? ? ?

## 2021-05-04 ENCOUNTER — Ambulatory Visit (INDEPENDENT_AMBULATORY_CARE_PROVIDER_SITE_OTHER): Payer: Medicare Other | Admitting: Licensed Clinical Social Worker

## 2021-05-04 DIAGNOSIS — F3342 Major depressive disorder, recurrent, in full remission: Secondary | ICD-10-CM

## 2021-05-04 DIAGNOSIS — F411 Generalized anxiety disorder: Secondary | ICD-10-CM

## 2021-05-04 DIAGNOSIS — E1169 Type 2 diabetes mellitus with other specified complication: Secondary | ICD-10-CM

## 2021-05-04 DIAGNOSIS — M159 Polyosteoarthritis, unspecified: Secondary | ICD-10-CM

## 2021-05-04 NOTE — Patient Instructions (Signed)
Visit Information ? ?Thank you for taking time to visit with me today. Please don't hesitate to contact me if I can be of assistance to you before our next scheduled telephone appointment. ? ?Following are the goals we discussed today:  ?Patient Goals/Self-Care Activities: Over the next 120 days ?Attend scheduled appts with providers ?Utilize stress management strategies discussed ?Contact PCP office with any questions or concerns ? ?Our next appointment is by telephone on 07/06/21 at 10:45 AM ? ?Please call the care guide team at [phone] if you need to cancel or reschedule your appointment.  ? ?If you are experiencing a Mental Health or Behavioral Health Crisis or need someone to talk to, please call the Suicide and Crisis Lifeline: 988 ?call 911  ? ?Patient verbalizes understanding of instructions and care plan provided today and agrees to view in MyChart. Active MyChart status confirmed with patient.   ? ?Jenel Lucks, MSW, LCSW ?[facility] Care Management ?[place]  [facility] ?[email or web address] ?Phone [phone] ?2:51 PM ?  ?

## 2021-05-04 NOTE — Chronic Care Management (AMB) (Signed)
?Chronic Care Management  ? ? Clinical Social Work Note ? ?05/04/2021 ?Name: Janet [name] MRN: [id number] DOB: May 22, 1950 ? ?Janet [name] is a 71 y.o. year old female who is a primary care patient of Olin Hauser, DO. The CCM team was consulted to assist the patient with chronic disease management and/or care coordination needs related to: Mental Health Counseling and Resources.  ? ?Engaged with patient by telephone for follow up visit in response to provider referral for social work chronic care management and care coordination services.  ? ?Consent to Services:  ?The patient was given information about Chronic Care Management services, agreed to services, and gave verbal consent prior to initiation of services.  Please see initial visit note for detailed documentation.  ? ?Patient agreed to services and consent obtained.  ? ?Assessment: Review of patient past medical history, allergies, medications, and health status, including review of relevant consultants reports was performed today as part of a comprehensive evaluation and provision of chronic care management and care coordination services.    ? ?SDOH (Social Determinants of Health) assessments and interventions performed:   ? ?Advanced Directives Status: Not addressed in this encounter. ? ?CCM Care Plan ? ?No Known Allergies ? ?Outpatient Encounter Medications as of 05/04/2021  ?Medication Sig Note  ? aspirin 81 MG EC tablet Take 81 mg by mouth daily.   ? atorvastatin (LIPITOR) 40 MG tablet Take 40 mg by mouth at bedtime.   ? baclofen (LIORESAL) 10 MG tablet Take 1 tablet (10 mg total) by mouth at bedtime as needed for muscle spasms.   ? DULoxetine (CYMBALTA) 30 MG capsule Take 1 capsule (30 mg total) by mouth daily.   ? gabapentin (NEURONTIN) 300 MG capsule Take 300 mg by mouth 3 (three) times daily. 12/04/2020: Taking 1 capsule (300 mg) in the morning and 2 capsules  (600 mg) at night as directed by Neurologist.  ? losartan-hydrochlorothiazide (HYZAAR)  100-25 MG tablet Take 1 tablet by mouth daily.   ? meloxicam (MOBIC) 15 MG tablet Take 15 mg by mouth daily as needed. (Patient not taking: Reported on 03/22/2021)   ? pramipexole (MIRAPEX) 1 MG tablet Take 1 tablet (1 mg total) by mouth 2 (two) times daily.   ? ?No facility-administered encounter medications on file as of 05/04/2021.  ? ? ?Patient Active Problem List  ? Diagnosis Date Noted  ? Chronic left-sided low back pain with left-sided sciatica 04/14/2020  ? History of deep vein thrombosis 09/16/2019  ? Type 2 diabetes mellitus with other specified complication ([place]) [id number]  ? Restless leg syndrome 08/20/2019  ? Major depression, recurrent, full remission ([place]) 08/20/2019  ? Other insomnia 08/20/2019  ? Symptomatic varicose veins, bilateral 08/20/2019  ? Edema of left lower extremity 08/20/2019  ? Primary osteoarthritis involving multiple joints 08/20/2019  ? Venous insufficiency of both lower extremities 08/20/2019  ? History of bariatric surgery 08/20/2019  ? Cardiac pacemaker 08/20/2019  ? Anxiety disorder 10/31/2018  ? Edema 10/31/2018  ? Heart murmur 10/31/2018  ? Osteoporosis 10/31/2018  ? Primary osteoarthritis of left knee 10/19/2018  ? History of pacemaker 09/12/2018  ? Shortness of breath 09/12/2018  ? Palpitations 06/20/2016  ? Postmenopausal bleeding 08/04/2015  ? Lymphedema 12/16/2014  ? Osteoarthritis 07/31/2014  ? 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 Gastroesophageal reflux disease 07/31/2014  ? Mixed hyperlipidemia 07/31/2014  ? Morbid obesity ([place]) 07/31/2014  ? Obstructive sleep apnea syndrome 07/31/2014  ? Peripheral nerve disease 07/31/2014  ? Urinary incontinence 07/31/2014  ? History of adenomatous polyp of  colon 02/28/2014  ? ? ?Conditions to be addressed/monitored: DMII, Anxiety, Depression, Osteoporosis, Osteoarthritis, and Chronic Pain ? ?Care Plan : LCSW Plan of Care  ?Updates made by Rebekah Chesterfield, LCSW since 05/04/2021 12:00 AM  ?  ? ?Problem: Coping Skills (General Plan of Care)   ?   ? ?Long-Range Goal: Coping Skills Enhanced   ?Start Date: 02/24/2021  ?This Visit's Progress: On track  ?Recent Progress: On track  ?Priority: High  ?Note:   ?Current barriers:   ?Chronic Mental Health needs related to Depression and Anxiety ?Limited social support and Family and relationship dysfunction ?Needs Support, Education, and Care Coordination in order to meet unmet mental health needs. ?Clinical Goal(s): demonstrate a reduction in symptoms related to :Depression: anxiety   ?Clinical Interventions:  ?Assessed patient's previous and current treatment, coping skills, support system and barriers to care  ?Patient reports ongoing pain from toothache. She has an upcoming dental appt today at 2 PM ?Patient reports difficulty managing mental heath conditions triggered by caregiver stress and limited boundaries with family. Patient recently returned from [place]. where she was caring for father 3/7: Patient reports improvement in management of stress despite recent return from [place] Saturday. Father's health has declined significantly triggering stress. Patient is receiving daily updates from sister and strong emotional support from family locally ?Patient has recently moved to a home alone until adult daughter returns 05/2021 ?Patient reports difficulty obtaining sleep. Strategies to improve sleep hygiene discussed ?Patient denies SI/HI. Protective factors identified to assist with motivation to utilize strategies to manage health conditions 3/7: Reports compliance with medicine and strategies that has been helpful in managing stress ?Patient reports compliance with medication management and was successful in identifying healthy coping skills  ?[facility] improved, focus on back pain. Identified pain management strategies (exercises/stretches, taking meds, not lifting heavy)  ?Mindfulness or Relaxation training provided ?Active listening / Reflection utilized  ?Emotional  Support Provided ?Provided psychoeducation for mental health needs  ?Reviewed mental health medications with patient and discussed importance of compliance:  ?Quality of sleep assessed & Sleep Hygiene techniques promoted  ?Caregiver stress acknowledged  ?Participation in counseling encouraged  ?Participation in support group encouraged  ?Verbalization of feelings encouraged  ; ?Review resources and discussed options for any psychosocial needs. None identified at visit ?1:1 collaboration with primary care provider regarding development and update of comprehensive plan of care as evidenced by provider attestation and co-signature ?Inter-disciplinary care team collaboration (see longitudinal plan of care) ?Patient Goals/Self-Care Activities: Over the next 120 days ?Attend scheduled appts with providers ?Utilize stress management strategies discussed ?Contact PCP office with any questions or concerns ?  ?  ?  ? ?Christa See, MSW, LCSW ?[facility] Care Management ?[place] [facility] ?[email or web address] [email or web address] ?Phone [phone] ?2:50 PM ? ? ? ?

## 2021-05-05 DIAGNOSIS — R2689 Other abnormalities of gait and mobility: Secondary | ICD-10-CM | POA: Diagnosis not present

## 2021-05-05 DIAGNOSIS — M79605 Pain in left leg: Secondary | ICD-10-CM | POA: Diagnosis not present

## 2021-05-05 DIAGNOSIS — M5459 Other low back pain: Secondary | ICD-10-CM | POA: Diagnosis not present

## 2021-05-10 DIAGNOSIS — M79605 Pain in left leg: Secondary | ICD-10-CM | POA: Diagnosis not present

## 2021-05-10 DIAGNOSIS — R2689 Other abnormalities of gait and mobility: Secondary | ICD-10-CM | POA: Diagnosis not present

## 2021-05-10 DIAGNOSIS — M5459 Other low back pain: Secondary | ICD-10-CM | POA: Diagnosis not present

## 2021-05-12 DIAGNOSIS — M5459 Other low back pain: Secondary | ICD-10-CM | POA: Diagnosis not present

## 2021-05-12 DIAGNOSIS — M79605 Pain in left leg: Secondary | ICD-10-CM | POA: Diagnosis not present

## 2021-05-12 DIAGNOSIS — R2689 Other abnormalities of gait and mobility: Secondary | ICD-10-CM | POA: Diagnosis not present

## 2021-05-17 DIAGNOSIS — M5459 Other low back pain: Secondary | ICD-10-CM | POA: Diagnosis not present

## 2021-05-17 DIAGNOSIS — M79605 Pain in left leg: Secondary | ICD-10-CM | POA: Diagnosis not present

## 2021-05-17 DIAGNOSIS — R2689 Other abnormalities of gait and mobility: Secondary | ICD-10-CM | POA: Diagnosis not present

## 2021-05-21 ENCOUNTER — Other Ambulatory Visit: Payer: Self-pay | Admitting: Family Medicine

## 2021-05-21 DIAGNOSIS — G2581 Restless legs syndrome: Secondary | ICD-10-CM

## 2021-05-21 DIAGNOSIS — F331 Major depressive disorder, recurrent, moderate: Secondary | ICD-10-CM

## 2021-05-21 DIAGNOSIS — G4709 Other insomnia: Secondary | ICD-10-CM

## 2021-05-21 DIAGNOSIS — I10 Essential (primary) hypertension: Secondary | ICD-10-CM

## 2021-05-24 DIAGNOSIS — M79605 Pain in left leg: Secondary | ICD-10-CM | POA: Diagnosis not present

## 2021-05-24 DIAGNOSIS — M5459 Other low back pain: Secondary | ICD-10-CM | POA: Diagnosis not present

## 2021-05-24 DIAGNOSIS — R2689 Other abnormalities of gait and mobility: Secondary | ICD-10-CM | POA: Diagnosis not present

## 2021-05-24 NOTE — Telephone Encounter (Signed)
Requested medications are due for refill today.  no ? ?Requested medications are on the active medications list.  no ? ?Last refill. 03/03/2020 #180 3 ? ?Future visit scheduled.   yes ? ?Notes to clinic.  Medication was discontinued 11/27/2020. ? ? ? ?Requested Prescriptions  ?Pending Prescriptions Disp Refills  ? metoprolol tartrate (LOPRESSOR) 25 MG tablet [Pharmacy Med Name: METOPROLOL TARTRATE 25MG TABLETS] 180 tablet 3  ?  Sig: TAKE 1 TABLET BY MOUTH AT BEDTIME  ?  ? Cardiovascular:  Beta Blockers Failed - 05/21/2021  7:06 AM  ?  ?  Failed - Last BP in normal range  ?  BP Readings from Last 1 Encounters:  ?05/03/21 (!) 147/85  ?  ?  ?  ?  Passed - Last Heart Rate in normal range  ?  Pulse Readings from Last 1 Encounters:  ?05/03/21 69  ?  ?  ?  ?  Passed - Valid encounter within last 6 months  ?  Recent Outpatient Visits   ? ?      ? 2 months ago Venous stasis dermatitis of left lower extremity  ? [place], DO  ? 8 months ago Annual physical exam  ? [place], DO  ? 1 year ago Restless leg syndrome  ? [place], DO  ? 1 year ago Type 2 diabetes mellitus with other specified complication, without long-term current use of insulin ([place])  ? Doctor Phillips, DO  ? 1 year ago Type 2 diabetes mellitus with other specified complication, without long-term current use of insulin ([place])  ? [place], DO  ? ?  ?  ?Future Appointments   ? ?        ? In 5 months [facility], [place]   ? ?  ? ?  ?  ?  ?Signed Prescriptions Disp Refills  ? DULoxetine (CYMBALTA) 30 MG capsule 90 capsule 0  ?  Sig: TAKE 1 CAPSULE(30 MG) BY MOUTH DAILY  ?  ? Psychiatry: Antidepressants - SNRI - duloxetine Failed - 05/21/2021  7:06 AM  ?  ?  Failed - Last BP in normal range  ?  BP Readings from Last 1 Encounters:  ?05/03/21 (!) 147/85   ?  ?  ?  ?  Passed - Cr in normal range and within 360 days  ?  Creat  ?Date Value Ref Range Status  ?09/09/2020 0.71 0.50 - 1.05 mg/dL Final  ?  ?  ?  ?  Passed - eGFR is 30 or above and within 360 days  ?  GFR, Est African American  ?Date Value Ref Range Status  ?09/12/2019 107 > OR = 60 mL/min/1.70m Final  ? ?GFR, Est Non African American  ?Date Value Ref Range Status  ?09/12/2019 93 > OR = 60 mL/min/1.724mFinal  ? ?eGFR  ?Date Value Ref Range Status  ?09/09/2020 93 > OR = 60 mL/min/1.7390minal  ?  Comment:  ?  The eGFR is based on the CKD-EPI 2021 equation. To calculate  ?the new eGFR from a previous Creatinine or Cystatin C ?result, go to https://www.kidney.org/professionals/ ?kdoqi/gfr%5Fcalculator ?  ?  ?  ?  ?  Passed - Completed PHQ-2 or PHQ-9 in the last 360 days  ?  ?  Passed - Valid encounter within last 6 months  ?  Recent Outpatient Visits   ? ?      ?  2 months ago Venous stasis dermatitis of left lower extremity  ? [place], DO  ? 8 months ago Annual physical exam  ? [place], DO  ? 1 year ago Restless leg syndrome  ? [place], DO  ? 1 year ago Type 2 diabetes mellitus with other specified complication, without long-term current use of insulin ([place])  ? [place], DO  ? 1 year ago Type 2 diabetes mellitus with other specified complication, without long-term current use of insulin ([place])  ? [place], DO  ? ?  ?  ?Future Appointments   ? ?        ? In 5 months [facility], [place]   ? ?  ? ?  ?  ?  ?  ?

## 2021-05-24 NOTE — Telephone Encounter (Signed)
Requested Prescriptions  ?Pending Prescriptions Disp Refills  ?? metoprolol tartrate (LOPRESSOR) 25 MG tablet [Pharmacy Med Name: METOPROLOL TARTRATE 25MG TABLETS] 180 tablet 3  ?  Sig: TAKE 1 TABLET BY MOUTH AT BEDTIME  ?  ? Cardiovascular:  Beta Blockers Failed - 05/21/2021  7:06 AM  ?  ?  Failed - Last BP in normal range  ?  BP Readings from Last 1 Encounters:  ?05/03/21 (!) 147/85  ?   ?  ?  Passed - Last Heart Rate in normal range  ?  Pulse Readings from Last 1 Encounters:  ?05/03/21 69  ?   ?  ?  Passed - Valid encounter within last 6 months  ?  Recent Outpatient Visits   ?      ? 2 months ago Venous stasis dermatitis of left lower extremity  ? [place], DO  ? 8 months ago Annual physical exam  ? [place], DO  ? 1 year ago Restless leg syndrome  ? [place], DO  ? 1 year ago Type 2 diabetes mellitus with other specified complication, without long-term current use of insulin ([place])  ? [place], DO  ? 1 year ago Type 2 diabetes mellitus with other specified complication, without long-term current use of insulin ([place])  ? [place], DO  ?  ?  ?Future Appointments   ?        ? In 5 months [place]   ?  ? ?  ?  ?  ?? DULoxetine (CYMBALTA) 30 MG capsule [Pharmacy Med Name: DULOXETINE DR 30MG CAPSULES] 90 capsule 0  ?  Sig: TAKE 1 CAPSULE(30 MG) BY MOUTH DAILY  ?  ? Psychiatry: Antidepressants - SNRI - duloxetine Failed - 05/21/2021  7:06 AM  ?  ?  Failed - Last BP in normal range  ?  BP Readings from Last 1 Encounters:  ?05/03/21 (!) 147/85  ?   ?  ?  Passed - Cr in normal range and within 360 days  ?  Creat  ?Date Value Ref Range Status  ?09/09/2020 0.71 0.50 - 1.05 mg/dL Final  ?   ?  ?  Passed - eGFR is 30 or above and within 360 days  ?  GFR, Est African American  ?Date Value  Ref Range Status  ?09/12/2019 107 > OR = 60 mL/min/1.69m Final  ? ?GFR, Est Non African American  ?Date Value Ref Range Status  ?09/12/2019 93 > OR = 60 mL/min/1.725mFinal  ? ?eGFR  ?Date Value Ref Range Status  ?09/09/2020 93 > OR = 60 mL/min/1.7372minal  ?  Comment:  ?  The eGFR is based on the CKD-EPI 2021 equation. To calculate  ?the new eGFR from a previous Creatinine or Cystatin C ?result, go to https://www.kidney.org/professionals/ ?kdoqi/gfr%5Fcalculator ?  ?   ?  ?  Passed - Completed PHQ-2 or PHQ-9 in the last 360 days  ?  ?  Passed - Valid encounter within last 6 months  ?  Recent Outpatient Visits   ?      ? 2 months ago Venous stasis dermatitis of left lower extremity  ? [place]  ? 8 months ago Annual physical exam  ? [place]  ? 1 year ago Restless leg  syndrome  ? [place], DO  ? 1 year ago Type 2 diabetes mellitus with other specified complication, without long-term current use of insulin ([place])  ? [place], DO  ? 1 year ago Type 2 diabetes mellitus with other specified complication, without long-term current use of insulin ([place])  ? [place], DO  ?  ?  ?Future Appointments   ?        ? In 5 months [facility], [place]   ?  ? ?  ?  ?  ? ?

## 2021-05-25 ENCOUNTER — Other Ambulatory Visit: Payer: Self-pay | Admitting: Family Medicine

## 2021-05-25 DIAGNOSIS — G2581 Restless legs syndrome: Secondary | ICD-10-CM

## 2021-05-26 DIAGNOSIS — M48062 Spinal stenosis, lumbar region with neurogenic claudication: Secondary | ICD-10-CM | POA: Diagnosis not present

## 2021-05-26 DIAGNOSIS — M5416 Radiculopathy, lumbar region: Secondary | ICD-10-CM | POA: Diagnosis not present

## 2021-05-26 DIAGNOSIS — M5136 Other intervertebral disc degeneration, lumbar region: Secondary | ICD-10-CM | POA: Diagnosis not present

## 2021-05-26 NOTE — Telephone Encounter (Signed)
Requested medication (s) are due for refill today: yes ? ?Requested medication (s) are on the active medication list: yes ? ?Last refill:  03/08/21 #30/2 ? ?Future visit scheduled: yes ? ?Notes to clinic:  Unable to refill per protocol due to failed labs, no updated results. ? ? ? ?  ?Requested Prescriptions  ?Pending Prescriptions Disp Refills  ? baclofen (LIORESAL) 10 MG tablet [Pharmacy Med Name: BACLOFEN 10MG TABLETS] 30 tablet   ?  Sig: TAKE 1 TABLET(10 MG) BY MOUTH AT BEDTIME AS NEEDED FOR MUSCLE SPASMS  ?  ? Analgesics:  Muscle Relaxants - baclofen Failed - 05/25/2021  7:04 AM  ?  ?  Failed - Cr in normal range and within 180 days  ?  Creat  ?Date Value Ref Range Status  ?09/09/2020 0.71 0.50 - 1.05 mg/dL Final  ?  ?  ?  ?  Failed - eGFR is 30 or above and within 180 days  ?  GFR, Est African American  ?Date Value Ref Range Status  ?09/12/2019 107 > OR = 60 mL/min/1.67m Final  ? ?GFR, Est Non African American  ?Date Value Ref Range Status  ?09/12/2019 93 > OR = 60 mL/min/1.739mFinal  ? ?eGFR  ?Date Value Ref Range Status  ?09/09/2020 93 > OR = 60 mL/min/1.7341minal  ?  Comment:  ?  The eGFR is based on the CKD-EPI 2021 equation. To calculate  ?the new eGFR from a previous Creatinine or Cystatin C ?result, go to https://www.kidney.org/professionals/ ?kdoqi/gfr%5Fcalculator ?  ?  ?  ?  ?  Passed - Valid encounter within last 6 months  ?  Recent Outpatient Visits   ? ?      ? 2 months ago Venous stasis dermatitis of left lower extremity  ? [place]  ? 8 months ago Annual physical exam  ? [place]O  ? 1 year ago Restless leg syndrome  ? Sou[place]O  ? 1 year ago Type 2 diabetes mellitus with other specified complication, without long-term current use of insulin ([place]? [place]  ? 1 year ago Type 2 diabetes mellitus with other  specified complication, without long-term current use of insulin ([place]? Sou[place]O  ? ?  ?  ?Future Appointments   ? ?        ? In 5 [facility] ?  ? ?  ?  ?  ? ?

## 2021-05-28 DIAGNOSIS — E1169 Type 2 diabetes mellitus with other specified complication: Secondary | ICD-10-CM

## 2021-05-28 DIAGNOSIS — F32A Depression, unspecified: Secondary | ICD-10-CM

## 2021-05-28 DIAGNOSIS — M8580 Other specified disorders of bone density and structure, unspecified site: Secondary | ICD-10-CM

## 2021-05-28 DIAGNOSIS — M1990 Unspecified osteoarthritis, unspecified site: Secondary | ICD-10-CM

## 2021-06-02 DIAGNOSIS — J029 Acute pharyngitis, unspecified: Secondary | ICD-10-CM | POA: Diagnosis not present

## 2021-06-02 DIAGNOSIS — J020 Streptococcal pharyngitis: Secondary | ICD-10-CM | POA: Diagnosis not present

## 2021-06-04 ENCOUNTER — Other Ambulatory Visit: Payer: Self-pay | Admitting: Family Medicine

## 2021-06-04 DIAGNOSIS — I10 Essential (primary) hypertension: Secondary | ICD-10-CM

## 2021-06-07 ENCOUNTER — Ambulatory Visit (INDEPENDENT_AMBULATORY_CARE_PROVIDER_SITE_OTHER): Payer: Medicare Other

## 2021-06-07 ENCOUNTER — Telehealth: Payer: Medicare Other

## 2021-06-07 DIAGNOSIS — M159 Polyosteoarthritis, unspecified: Secondary | ICD-10-CM

## 2021-06-07 DIAGNOSIS — Z9181 History of falling: Secondary | ICD-10-CM

## 2021-06-07 DIAGNOSIS — F411 Generalized anxiety disorder: Secondary | ICD-10-CM

## 2021-06-07 DIAGNOSIS — I890 Lymphedema, not elsewhere classified: Secondary | ICD-10-CM

## 2021-06-07 DIAGNOSIS — E1169 Type 2 diabetes mellitus with other specified complication: Secondary | ICD-10-CM

## 2021-06-07 DIAGNOSIS — G8929 Other chronic pain: Secondary | ICD-10-CM

## 2021-06-07 DIAGNOSIS — F331 Major depressive disorder, recurrent, moderate: Secondary | ICD-10-CM

## 2021-06-07 DIAGNOSIS — I872 Venous insufficiency (chronic) (peripheral): Secondary | ICD-10-CM

## 2021-06-07 DIAGNOSIS — I10 Essential (primary) hypertension: Secondary | ICD-10-CM

## 2021-06-07 DIAGNOSIS — M1712 Unilateral primary osteoarthritis, left knee: Secondary | ICD-10-CM

## 2021-06-07 DIAGNOSIS — F3342 Major depressive disorder, recurrent, in full remission: Secondary | ICD-10-CM

## 2021-06-07 DIAGNOSIS — M150 Primary generalized (osteo)arthritis: Secondary | ICD-10-CM

## 2021-06-07 DIAGNOSIS — G2581 Restless legs syndrome: Secondary | ICD-10-CM

## 2021-06-07 DIAGNOSIS — I739 Peripheral vascular disease, unspecified: Secondary | ICD-10-CM

## 2021-06-07 DIAGNOSIS — E782 Mixed hyperlipidemia: Secondary | ICD-10-CM

## 2021-06-07 NOTE — Chronic Care Management (AMB) (Signed)
?Chronic Care Management  ? ?CCM RN Visit Note ? ?06/07/2021 ?Name: Janet [name] MRN: [id number] DOB: 1950-08-30 ? ?Subjective: ?Janet [name] is a 71 y.o. year old female who is a primary care patient of Olin Hauser, DO. The care management team was consulted for assistance with disease management and care coordination needs.   ? ?Engaged with patient by telephone for follow up visit in response to provider referral for case management and/or care coordination services.  ? ?Consent to Services:  ?The patient was given information about Chronic Care Management services, agreed to services, and gave verbal consent prior to initiation of services.  Please see initial visit note for detailed documentation.  ? ?Patient agreed to services and verbal consent obtained.  ? ?Assessment: Review of patient past medical history, allergies, medications, health status, including review of consultants reports, laboratory and other test data, was performed as part of comprehensive evaluation and provision of chronic care management services.  ? ?SDOH (Social Determinants of Health) assessments and interventions performed:   ? ?CCM Care Plan ? ?No Known Allergies ? ?Outpatient Encounter Medications as of 06/07/2021  ?Medication Sig Note  ? aspirin 81 MG EC tablet Take 81 mg by mouth daily.   ? atorvastatin (LIPITOR) 40 MG tablet Take 40 mg by mouth at bedtime.   ? baclofen (LIORESAL) 10 MG tablet TAKE 1 TABLET(10 MG) BY MOUTH AT BEDTIME AS NEEDED FOR MUSCLE SPASMS   ? DULoxetine (CYMBALTA) 30 MG capsule TAKE 1 CAPSULE(30 MG) BY MOUTH DAILY   ? gabapentin (NEURONTIN) 300 MG capsule Take 300 mg by mouth 3 (three) times daily. 12/04/2020: Taking 1 capsule (300 mg) in the morning and 2 capsules  (600 mg) at night as directed by Neurologist.  ? losartan-hydrochlorothiazide (HYZAAR) 100-25 MG tablet Take 1 tablet by mouth daily.   ? meloxicam (MOBIC) 15 MG tablet Take 15 mg by mouth daily as needed. (Patient not taking: Reported on  03/22/2021)   ? pramipexole (MIRAPEX) 1 MG tablet Take 1 tablet (1 mg total) by mouth 2 (two) times daily.   ? ?No facility-administered encounter medications on file as of 06/07/2021.  ? ? ?Patient Active Problem List  ? Diagnosis Date Noted  ? Chronic left-sided low back pain with left-sided sciatica 04/14/2020  ? History of deep vein thrombosis 09/16/2019  ? Type 2 diabetes mellitus with other specified complication ([place]) [id number]  ? Restless leg syndrome 08/20/2019  ? Major depression, recurrent, full remission ([place]) 08/20/2019  ? Other insomnia 08/20/2019  ? Symptomatic varicose veins, bilateral 08/20/2019  ? Edema of left lower extremity 08/20/2019  ? Primary osteoarthritis involving multiple joints 08/20/2019  ? Venous insufficiency of both lower extremities 08/20/2019  ? History of bariatric surgery 08/20/2019  ? Cardiac pacemaker 08/20/2019  ? Anxiety disorder 10/31/2018  ? Edema 10/31/2018  ? Heart murmur 10/31/2018  ? Osteoporosis 10/31/2018  ? Primary osteoarthritis of left knee 10/19/2018  ? History of pacemaker 09/12/2018  ? Shortness of breath 09/12/2018  ? Palpitations 06/20/2016  ? Postmenopausal bleeding 08/04/2015  ? Lymphedema 12/16/2014  ? Osteoarthritis 07/31/2014  ? 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 Gastroesophageal reflux disease 07/31/2014  ? Mixed hyperlipidemia 07/31/2014  ? Morbid obesity ([place]) 07/31/2014  ? Obstructive sleep apnea syndrome 07/31/2014  ? Peripheral nerve disease 07/31/2014  ? Urinary incontinence 07/31/2014  ? History of adenomatous polyp of colon 02/28/2014  ? ? ?Conditions to be addressed/monitored:HTN, HLD, DMII, Anxiety, Depression, and chronic pain, RLS, Falls and swelling in legs ? ?Care Plan : RNCM:  Adult plan of care for  Chronic Disease Management and Care Coordination Needs  ?Updates made by Vanita Ingles, RN since 06/07/2021 12:00 AM  ?  ? ?Problem: RNCM: Adult plan of care for Chronic Disease Management and Care Coordination Needs   ?Priority: High  ?Onset Date:  11/16/2020  ?  ? ?Long-Range Goal: RNCM: Adult Plan of Care for Chronic Disease Management and Care Coordination Needs   ?Start Date: 11/16/2020  ?Expected End Date: 11/16/2021  ?Recent Progress: On track  ?Priority: High  ?Note:   ?Current Barriers:  ?Knowledge Deficits related to plan of care for management of HTN, HLD, DMII, Depression, Anxiety with family circumstances and not caring for self, Depression: depressed mood, and chronic pain (RLS, OA, Chronic back pain),l and falls and safety concerns ?insomnia, ?fatigue, ?anxiety, ?disturbed sleep,, Caregiver Stress, Grief, Insomnia/Sleep Difficulties, and Stress at family circumstances, and grief process over the loss of her oldest daughter to [place] in October 2021  ?Care Coordination needs related to Limited social support, Level of care concerns, Mental Health Concerns , and Family and relationship dysfunction  ?Chronic Disease Management support and education needs related to HTN,  HLD, DMII, Depression, Anxiety with Social Anxiety,, Depression: depressed mood, falls prevention and safety and chronic pain  ?insomnia, ?fatigue, ?anxiety,, Caregiver Stress, Grief, Mood Instability, and Stress at family situations and circumstances, and grief at the loss of her oldest daughter last year in October of 2021 ?Lacks caregiver support.  ?Non-adherence to prescribed medication regimen ?03-08-2021: New onset of cellulitis to left leg first seen in ER on 02-26-2021, follow up with pcp on 03-08-2021- 06-07-2021- resolved ? ?RNCM Clinical Goal(s):  ?Patient will verbalize understanding of plan for management of HTN, HLD, DMII, Anxiety, Depression,  falls prevention, and Chronic pain  ?verbalize basic understanding of HLD, DMII, Anxiety, Depression, falls prevention, and Chronic pain  disease process and self health management plan  ?take all medications exactly as prescribed and will call provider for medication related questions ?demonstrate understanding of rationale for each  prescribed medication patient desires to work with pharm D for effective management of chronic conditions and how to take her medications as prescribed ?demonstrate improved and ongoing adherence to prescribed treatment plan for HTN, HLD, DMII, Anxiety, Depression, falls prevention, and chronic pain  as evidenced by adherence to ADA/ carb modified diet adherence to prescribed medication regimen contacting provider for new or worsened symptoms or questions and working with the CCM team for effective management of chronic diseases  ?demonstrate improved and ongoing health management independence by remaining safe in home environment and effective management of Chronic conditions  ?continue to work with RN Care Manager to address care management and care coordination needs related to HLD, DMII, Anxiety, Depression, and chronic pain and fall prevention   ?work with pharmacist to address medication concerns as the patient admits she does not take her medications as prescribed and struggles with managing her medications related to HTN, HLD, DMII, Anxiety, Depression, and chronic pain and falls prevention and safety through collaboration with RN Care manager, provider, and care team.  ? ?Interventions: ?1:1 collaboration with primary care provider regarding development and update of comprehensive plan of care as evidenced by provider attestation and co-signature ?Inter-disciplinary care team collaboration (see longitudinal plan of care) ?Evaluation of current treatment plan related to  self management and patient's adherence to plan as established by provider ? ? ?SDOH Barriers (Status: Goal on track: YES.)  ?Patient interviewed and SDOH assessment performed ?       ?  SDOH Interventions   ? ?Flowsheet Row Most Recent Value  ?SDOH Interventions   ?Food Insecurity Interventions Intervention Not Indicated  ?Intimate Partner Violence Interventions Intervention Not Indicated  ?Physical Activity Interventions Other (Comments)   [no structured activities]  ?Social Connections Interventions Other (Comment)  [good support system, is moving soon]  ?Transportation Interventions Intervention Not Indicated  ? ?  ?Patient interviewed and appro

## 2021-06-07 NOTE — Telephone Encounter (Signed)
Requested medication (s) are due for refill today: Yes ? ?Requested medication (s) are on the active medication list: Yes ? ?Last refill:  03/03/20 ? ?Future visit scheduled: No ? ?Notes to clinic:  Prescription expired. ? ? ? ?Requested Prescriptions  ?Pending Prescriptions Disp Refills  ? losartan-hydrochlorothiazide (HYZAAR) 100-25 MG tablet [Pharmacy Med Name: LOSARTAN/HCTZ 100/25MG TABLETS] 90 tablet 3  ?  Sig: Take 1 tablet by mouth daily.  ?  ? Cardiovascular: ARB + Diuretic Combos Failed - 06/04/2021  2:04 PM  ?  ?  Failed - K in normal range and within 180 days  ?  Potassium  ?Date Value Ref Range Status  ?09/09/2020 4.4 3.5 - 5.3 mmol/L Final  ?  ?  ?  ?  Failed - Na in normal range and within 180 days  ?  Sodium  ?Date Value Ref Range Status  ?09/09/2020 142 135 - 146 mmol/L Final  ?  ?  ?  ?  Failed - Cr in normal range and within 180 days  ?  Creat  ?Date Value Ref Range Status  ?09/09/2020 0.71 0.50 - 1.05 mg/dL Final  ?  ?  ?  ?  Failed - eGFR is 10 or above and within 180 days  ?  GFR, Est African American  ?Date Value Ref Range Status  ?09/12/2019 107 > OR = 60 mL/min/1.37m Final  ? ?GFR, Est Non African American  ?Date Value Ref Range Status  ?09/12/2019 93 > OR = 60 mL/min/1.76mFinal  ? ?eGFR  ?Date Value Ref Range Status  ?09/09/2020 93 > OR = 60 mL/min/1.7376minal  ?  Comment:  ?  The eGFR is based on the CKD-EPI 2021 equation. To calculate  ?the new eGFR from a previous Creatinine or Cystatin C ?result, go to https://www.kidney.org/professionals/ ?kdoqi/gfr%5Fcalculator ?  ?  ?  ?  ?  Failed - Last BP in normal range  ?  BP Readings from Last 1 Encounters:  ?05/03/21 (!) 147/85  ?  ?  ?  ?  Passed - Patient is not pregnant  ?  ?  Passed - Valid encounter within last 6 months  ?  Recent Outpatient Visits   ? ?      ? 3 months ago Venous stasis dermatitis of left lower extremity  ? [place]  ? 8 months ago Annual physical exam  ? [place]  ? 1 year ago Restless leg syndrome  ? Sou[place]O  ? 1 year ago Type 2 diabetes mellitus with other specified complication, without long-term current use of insulin ([place]? [place]  ? 1 year ago Type 2 diabetes mellitus with other specified complication, without long-term current use of insulin ([place]? [place]  ? ?  ?  ?Future Appointments   ? ?        ? [facility] ?  ? ?  ?  ?  ? ?

## 2021-06-07 NOTE — Patient Instructions (Signed)
Visit Information ? ?Thank you for taking time to visit with me today. Please don't hesitate to contact me if I can be of assistance to you before our next scheduled telephone appointment. ? ?Following are the goals we discussed today:  ?RNCM Clinical Goal(s):  ?Patient will verbalize understanding of plan for management of HTN, HLD, DMII, Anxiety, Depression,  falls prevention, and Chronic pain  ?verbalize basic understanding of HLD, DMII, Anxiety, Depression, falls prevention, and Chronic pain  disease process and self health management plan  ?take all medications exactly as prescribed and will call provider for medication related questions ?demonstrate understanding of rationale for each prescribed medication patient desires to work with pharm D for effective management of chronic conditions and how to take her medications as prescribed ?demonstrate improved and ongoing adherence to prescribed treatment plan for HTN, HLD, DMII, Anxiety, Depression, falls prevention, and chronic pain  as evidenced by adherence to ADA/ carb modified diet adherence to prescribed medication regimen contacting provider for new or worsened symptoms or questions and working with the CCM team for effective management of chronic diseases  ?demonstrate improved and ongoing health management independence by remaining safe in home environment and effective management of Chronic conditions  ?continue to work with RN Care Manager to address care management and care coordination needs related to HLD, DMII, Anxiety, Depression, and chronic pain and fall prevention   ?work with pharmacist to address medication concerns as the patient admits she does not take her medications as prescribed and struggles with managing her medications related to HTN, HLD, DMII, Anxiety, Depression, and chronic pain and falls prevention and safety through collaboration with RN Care manager, provider, and care team.  ?  ?Interventions: ?1:1 collaboration with primary  care provider regarding development and update of comprehensive plan of care as evidenced by provider attestation and co-signature ?Inter-disciplinary care team collaboration (see longitudinal plan of care) ?Evaluation of current treatment plan related to  self management and patient's adherence to plan as established by provider ?  ?  ?SDOH Barriers (Status: Goal on track: YES.)  ?Patient interviewed and SDOH assessment performed ?       ?SDOH Interventions   ?  ?Flowsheet Row Most Recent Value  ?SDOH Interventions    ?Food Insecurity Interventions Intervention Not Indicated  ?Intimate Partner Violence Interventions Intervention Not Indicated  ?Physical Activity Interventions Other (Comments)  [no structured activities]  ?Social Connections Interventions Other (Comment)  [good support system, is moving soon]  ?Transportation Interventions Intervention Not Indicated  ?  ?   ?Patient interviewed and appropriate assessments performed ?Provided patient with information about CCM team and working with the patient to manage chronic conditions and effectively manage health and well being. The patient wants to be proactive in her care and is willing to work with the team ?Discussed plans with patient for ongoing care management follow up and provided patient with direct contact information for care management team ?Advised patient to call the office for changes in condition or SDOH needs. The patient states that she is hoping by working with the CCM team she can remember best how to take her medications and take care of herself beter.  ?Collaborated with RN Case Manager re: ongoing support and education for management of chronic conditions and health and well being goals. 03-08-2021: The patient has moved into a house with her daughter in [place] and is doing very well. She had been out of town caring for her elderly father. She is doing well and in the  office for a follow up today of left lower leg cellulitis ?  ?  ?   ?Diabetes:  (Status: Goal on track: YES.) ?     ?Lab Results  ?Component Value Date  ?  HGBA1C 5.7 (H) 09/09/2020  ?  ?Assessed patient's understanding of A1c goal: <7%. 06-07-2021: Knows the goal of A1C less than 7.0% ?Provided education to patient about basic DM disease process; ?Reviewed prescribed diet with patient 11-16-2020: Review of heart healthy/ADA diet. The patient eats small meals and sometimes is not eating what she should. Sometimes eats a bedtime snack sometimes not. The patient encouraged to follow a heart healthy/ADA diet. Will send information by my chart system for review and help with meal choices and options. 11-30-2020: Is eating well and denies any issues with compliance with heart healthy/ADA diet. 01-25-2021: [place] of [place] elderly father. She states she is actually doing very well right now. Denies any acute issues with management of DM or following dietary restrictions. 03-08-2021: The patient feels better and is happy that she is moved and into the new house with her daughter. The patient was in the office today for follow up of left lower leg cellulitis and states that her blood sugars are WNL and she is eating better. 06-07-2021: The patient states she is doing well and eating well. Denies any issues with her dietary intake; ?Counseled on importance of regular laboratory monitoring as prescribed. 06-07-2021: The patient keeps appointments and has regular lab work.         ?Discussed plans with patient for ongoing care management follow up and provided patient with direct contact information for care management team;      ?Provided patient with written educational materials related to hypo and hyperglycemia and importance of correct treatment. 11-16-2020: Reviewed with the patient the sx and sx of hypo and hyperglycemia. The patient does not check her blood sugars as recommended by the provider. She does have a meter but does not take medications for DM and mainly controls  DM with diet. Education and support given. The patient states that she will start taking blood sugars if she feels different. Denies lows; however states that sometimes at night she experiences "sweating". Eduction and support given. Will continue to monitor. 11-30-2020: The patient denies any lows at this time. States her blood sugar this am was 109. States she is doing better with management of her overall health and well being. Will continue to monitor.   03-08-2021: Saw patient face to face and provided a calendar for her with places to record blood sugar and blood pressure readings. Also gave her the healthy eating handout for reference and review. The patient has the [facility] new telephone number for needs and support between regular outreaches.  06-07-2021: The patient states her blood sugars are doing well. She denies any new concerns with her DM.     ?Review of patient status, including review of consultants reports, relevant laboratory and other test results, and medications completed;       ?Eye exam last week (Today's date 06-07-2021). Will pick up new glasses on 06-15-2021 ?  ?Falls:  (Status: Goal on track: YES.) ?Provided written and verbal education re: potential causes of falls and Fall prevention strategies ?Reviewed medications and discussed potential side effects of medications such as dizziness and frequent urination. 11-16-2020: The patient reviewed with the RNCM medications that she takes. Is following up with the pharm D as she forgets to take medications. Will  monitor for medications that may cause her to have issues with fall hazards. 01-25-2021: Working with the Pharm D for medications management, questions and concerns. 06-07-2021: Ongoing support and education for medication needs from pharm D and RNCM.  ?Advised patient of importance of notifying provider of falls. 06-07-2021: Knows to call the office for new falls or issues with safety ?Assessed for signs and symptoms of orthostatic hypotension.  9-19-202: Denies any issues with orthostatic hypotension. Takes blood pressures sometimes at home. Advised the patient to take blood pressures and record. 06-07-2021: Denies any low blood pressures at this

## 2021-06-08 DIAGNOSIS — R29898 Other symptoms and signs involving the musculoskeletal system: Secondary | ICD-10-CM | POA: Diagnosis not present

## 2021-06-08 DIAGNOSIS — M79641 Pain in right hand: Secondary | ICD-10-CM | POA: Diagnosis not present

## 2021-06-08 DIAGNOSIS — G629 Polyneuropathy, unspecified: Secondary | ICD-10-CM | POA: Diagnosis not present

## 2021-06-08 DIAGNOSIS — M79642 Pain in left hand: Secondary | ICD-10-CM | POA: Diagnosis not present

## 2021-06-08 DIAGNOSIS — M48061 Spinal stenosis, lumbar region without neurogenic claudication: Secondary | ICD-10-CM | POA: Diagnosis not present

## 2021-06-08 DIAGNOSIS — G2581 Restless legs syndrome: Secondary | ICD-10-CM | POA: Diagnosis not present

## 2021-06-10 DIAGNOSIS — I10 Essential (primary) hypertension: Secondary | ICD-10-CM | POA: Diagnosis not present

## 2021-06-10 DIAGNOSIS — E119 Type 2 diabetes mellitus without complications: Secondary | ICD-10-CM | POA: Diagnosis not present

## 2021-06-10 DIAGNOSIS — I340 Nonrheumatic mitral (valve) insufficiency: Secondary | ICD-10-CM | POA: Diagnosis not present

## 2021-06-10 DIAGNOSIS — E782 Mixed hyperlipidemia: Secondary | ICD-10-CM | POA: Diagnosis not present

## 2021-06-10 DIAGNOSIS — G4730 Sleep apnea, unspecified: Secondary | ICD-10-CM | POA: Diagnosis not present

## 2021-06-10 DIAGNOSIS — I739 Peripheral vascular disease, unspecified: Secondary | ICD-10-CM | POA: Diagnosis not present

## 2021-06-10 DIAGNOSIS — K219 Gastro-esophageal reflux disease without esophagitis: Secondary | ICD-10-CM | POA: Diagnosis not present

## 2021-06-11 DIAGNOSIS — I739 Peripheral vascular disease, unspecified: Secondary | ICD-10-CM | POA: Diagnosis not present

## 2021-06-11 DIAGNOSIS — I10 Essential (primary) hypertension: Secondary | ICD-10-CM | POA: Diagnosis not present

## 2021-06-11 DIAGNOSIS — E119 Type 2 diabetes mellitus without complications: Secondary | ICD-10-CM | POA: Diagnosis not present

## 2021-06-11 DIAGNOSIS — E782 Mixed hyperlipidemia: Secondary | ICD-10-CM | POA: Diagnosis not present

## 2021-06-11 DIAGNOSIS — G4730 Sleep apnea, unspecified: Secondary | ICD-10-CM | POA: Diagnosis not present

## 2021-06-11 DIAGNOSIS — I340 Nonrheumatic mitral (valve) insufficiency: Secondary | ICD-10-CM | POA: Diagnosis not present

## 2021-06-11 DIAGNOSIS — K219 Gastro-esophageal reflux disease without esophagitis: Secondary | ICD-10-CM | POA: Diagnosis not present

## 2021-06-15 ENCOUNTER — Telehealth (INDEPENDENT_AMBULATORY_CARE_PROVIDER_SITE_OTHER): Payer: Self-pay

## 2021-06-15 NOTE — Telephone Encounter (Signed)
Patient called and left a message wanting to know if she needed a driver for the sclerotherapy appt in our office. I advised that she does not need a driver for this procedure. ?

## 2021-06-16 DIAGNOSIS — M5416 Radiculopathy, lumbar region: Secondary | ICD-10-CM | POA: Diagnosis not present

## 2021-06-16 DIAGNOSIS — M48062 Spinal stenosis, lumbar region with neurogenic claudication: Secondary | ICD-10-CM | POA: Diagnosis not present

## 2021-06-17 ENCOUNTER — Encounter (INDEPENDENT_AMBULATORY_CARE_PROVIDER_SITE_OTHER): Payer: Self-pay | Admitting: Vascular Surgery

## 2021-06-17 ENCOUNTER — Ambulatory Visit (INDEPENDENT_AMBULATORY_CARE_PROVIDER_SITE_OTHER): Payer: Medicare Other | Admitting: Vascular Surgery

## 2021-06-17 VITALS — BP 112/71 | HR 80 | Resp 16 | Wt 214.8 lb

## 2021-06-17 DIAGNOSIS — I83893 Varicose veins of bilateral lower extremities with other complications: Secondary | ICD-10-CM

## 2021-06-17 NOTE — Progress Notes (Signed)
? ?  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 ? ?Procedure:  Sclerotherapy using hypertonic saline mixed with 1% Lidocaine was performed on the left lower extremity.  Compression wraps were placed.  The patient tolerated the procedure well. ? ?Plan:  Follow up as needed. ?  ?

## 2021-06-22 DIAGNOSIS — I340 Nonrheumatic mitral (valve) insufficiency: Secondary | ICD-10-CM | POA: Diagnosis not present

## 2021-06-22 DIAGNOSIS — I10 Essential (primary) hypertension: Secondary | ICD-10-CM | POA: Diagnosis not present

## 2021-06-24 DIAGNOSIS — I10 Essential (primary) hypertension: Secondary | ICD-10-CM | POA: Diagnosis not present

## 2021-06-27 DIAGNOSIS — E1169 Type 2 diabetes mellitus with other specified complication: Secondary | ICD-10-CM | POA: Diagnosis not present

## 2021-06-27 DIAGNOSIS — I10 Essential (primary) hypertension: Secondary | ICD-10-CM

## 2021-06-27 DIAGNOSIS — E782 Mixed hyperlipidemia: Secondary | ICD-10-CM

## 2021-06-27 DIAGNOSIS — M159 Polyosteoarthritis, unspecified: Secondary | ICD-10-CM

## 2021-06-27 DIAGNOSIS — Z87891 Personal history of nicotine dependence: Secondary | ICD-10-CM | POA: Diagnosis not present

## 2021-06-27 DIAGNOSIS — M1712 Unilateral primary osteoarthritis, left knee: Secondary | ICD-10-CM

## 2021-06-27 DIAGNOSIS — F3342 Major depressive disorder, recurrent, in full remission: Secondary | ICD-10-CM

## 2021-07-06 ENCOUNTER — Telehealth: Payer: Medicare Other

## 2021-07-09 ENCOUNTER — Telehealth: Payer: Self-pay | Admitting: Licensed Clinical Social Worker

## 2021-07-09 NOTE — Telephone Encounter (Signed)
? ?   Clinical Social Work  ?Care Management  ? Phone Outreach  ? ? ?07/09/2021 ?Name: Sydnie Sigmund MRN: [id number] DOB: Dec 20, 1950 ? ?Birdell Frasier is a 71 y.o. year old female who is a primary care patient of Smitty Cords, DO .  ? ?Reason for referral: Mental Health Counseling and Resources.   ? ?F/U phone call today to assess needs, progress and barriers with care plan goals.   Telephone outreach was unsuccessful. A HIPPA compliant phone message was left for the patient providing contact information and requesting a return call.  ? ?Plan:CCM LCSW will wait for return call. If no return call is received, Will route chart to Care Guide to see if patient would like to reschedule phone appointment  ? ?Review of patient status, including review of consultants reports, relevant laboratory and other test results, and collaboration with appropriate care team members and the patient's provider was performed as part of comprehensive patient evaluation and provision of care management services.   ? ?Jenel Lucks, MSW, LCSW ?[facility] Care Management ?[place]  [facility] ?[email or web address] ?Phone [phone] ?6:28 AM ? ? ? ?  ? ? ? ? ?

## 2021-07-12 DIAGNOSIS — I10 Essential (primary) hypertension: Secondary | ICD-10-CM | POA: Diagnosis not present

## 2021-07-12 DIAGNOSIS — E782 Mixed hyperlipidemia: Secondary | ICD-10-CM | POA: Diagnosis not present

## 2021-07-12 DIAGNOSIS — E119 Type 2 diabetes mellitus without complications: Secondary | ICD-10-CM | POA: Diagnosis not present

## 2021-07-12 DIAGNOSIS — G4730 Sleep apnea, unspecified: Secondary | ICD-10-CM | POA: Diagnosis not present

## 2021-07-12 DIAGNOSIS — I340 Nonrheumatic mitral (valve) insufficiency: Secondary | ICD-10-CM | POA: Diagnosis not present

## 2021-07-12 DIAGNOSIS — I739 Peripheral vascular disease, unspecified: Secondary | ICD-10-CM | POA: Diagnosis not present

## 2021-07-12 DIAGNOSIS — K219 Gastro-esophageal reflux disease without esophagitis: Secondary | ICD-10-CM | POA: Diagnosis not present

## 2021-07-16 ENCOUNTER — Other Ambulatory Visit: Payer: Self-pay | Admitting: Family Medicine

## 2021-07-16 DIAGNOSIS — M5030 Other cervical disc degeneration, unspecified cervical region: Secondary | ICD-10-CM | POA: Diagnosis not present

## 2021-07-16 DIAGNOSIS — Z8739 Personal history of other diseases of the musculoskeletal system and connective tissue: Secondary | ICD-10-CM | POA: Diagnosis not present

## 2021-07-16 DIAGNOSIS — M5416 Radiculopathy, lumbar region: Secondary | ICD-10-CM | POA: Diagnosis not present

## 2021-07-16 DIAGNOSIS — M5412 Radiculopathy, cervical region: Secondary | ICD-10-CM | POA: Diagnosis not present

## 2021-07-16 DIAGNOSIS — M4807 Spinal stenosis, lumbosacral region: Secondary | ICD-10-CM | POA: Diagnosis not present

## 2021-07-16 DIAGNOSIS — M50123 Cervical disc disorder at C6-C7 level with radiculopathy: Secondary | ICD-10-CM | POA: Diagnosis not present

## 2021-07-16 DIAGNOSIS — I6522 Occlusion and stenosis of left carotid artery: Secondary | ICD-10-CM | POA: Diagnosis not present

## 2021-07-16 DIAGNOSIS — M5136 Other intervertebral disc degeneration, lumbar region: Secondary | ICD-10-CM | POA: Diagnosis not present

## 2021-07-16 DIAGNOSIS — I700 Atherosclerosis of aorta: Secondary | ICD-10-CM | POA: Diagnosis not present

## 2021-07-16 DIAGNOSIS — M48062 Spinal stenosis, lumbar region with neurogenic claudication: Secondary | ICD-10-CM | POA: Diagnosis not present

## 2021-07-20 ENCOUNTER — Encounter: Payer: Self-pay | Admitting: Family Medicine

## 2021-07-20 DIAGNOSIS — I6522 Occlusion and stenosis of left carotid artery: Secondary | ICD-10-CM | POA: Insufficient documentation

## 2021-07-29 ENCOUNTER — Ambulatory Visit (INDEPENDENT_AMBULATORY_CARE_PROVIDER_SITE_OTHER): Payer: Medicare Other | Admitting: Vascular Surgery

## 2021-08-02 ENCOUNTER — Ambulatory Visit (INDEPENDENT_AMBULATORY_CARE_PROVIDER_SITE_OTHER): Payer: Medicare Other | Admitting: Pharmacist

## 2021-08-02 DIAGNOSIS — E1169 Type 2 diabetes mellitus with other specified complication: Secondary | ICD-10-CM

## 2021-08-02 DIAGNOSIS — I10 Essential (primary) hypertension: Secondary | ICD-10-CM

## 2021-08-02 NOTE — Chronic Care Management (AMB) (Signed)
Chronic Care Management CCM Pharmacy Note  08/02/2021 Name:  Janet [name] MRN:  [id number] DOB:  25-Jul-1950   Subjective: Janet [name] is an 71 y.o. year old female who is a primary patient of Smitty Cords, DO.  The CCM team was consulted for assistance with disease management and care coordination needs.    Engaged with patient by telephone for follow up visit for pharmacy case management and/or care coordination services.   Objective:  Medications Reviewed Today     Reviewed by Manuela Neptune, RPH-CPP (Pharmacist) on 08/02/21 at 1350  Med List Status: <None>   Medication Order Taking? Sig Documenting Provider Last Dose Status Informant  aspirin 81 MG EC tablet [id number]  Take 81 mg by mouth daily. [provider]  Active   atorvastatin (LIPITOR) 40 MG tablet [id number] Yes Take 40 mg by mouth at bedtime. [provider] Taking Active   baclofen (LIORESAL) 10 MG tablet [id number] Yes TAKE 1 TABLET(10 MG) BY MOUTH AT BEDTIME AS NEEDED FOR MUSCLE SPASMS Smitty Cords, DO Taking Active   DULoxetine (CYMBALTA) 30 MG capsule [id number] Yes TAKE 1 CAPSULE(30 MG) BY MOUTH DAILY Althea Charon, Netta Neat, DO Taking Active   gabapentin (NEURONTIN) 300 MG capsule [id number] Yes Take 300 mg by mouth 3 (three) times daily. [provider] Taking Active            Med Note [name], [facility] A   Fri Dec 04, 2020  9:37 AM) Taking 1 capsule (300 mg) in the morning and 2 capsules  (600 mg) at night as directed by Neurologist.  losartan-hydrochlorothiazide (HYZAAR) 100-25 MG tablet [id number] Yes TAKE 1 TABLET BY MOUTH DAILY Althea Charon, Netta Neat, DO Taking Active   meloxicam (MOBIC) 15 MG tablet [id number]  Take 15 mg by mouth daily as needed.  Patient not taking: Reported on 03/22/2021   [provider]  Active   pramipexole (MIRAPEX) 1 MG tablet [id number] Yes Take 1 tablet (1 mg total) by mouth 2 (two) times daily. Smitty Cords, DO  Taking Active             Pertinent Labs:  Lab Results  Component Value Date   HGBA1C 5.7 (H) 09/09/2020   Lab Results  Component Value Date   CHOL 133 09/09/2020   HDL 52 09/09/2020   LDLCALC 67 09/09/2020   TRIG 56 09/09/2020   CHOLHDL 2.6 09/09/2020   Lab Results  Component Value Date   CREATININE 0.71 09/09/2020   BUN 26 (H) 09/09/2020   NA 142 09/09/2020   K 4.4 09/09/2020   CL 105 09/09/2020   CO2 29 09/09/2020   BP Readings from Last 3 Encounters:  06/17/21 112/71  05/03/21 (!) 147/85  03/08/21 (!) 157/85   Pulse Readings from Last 3 Encounters:  06/17/21 80  05/03/21 69  03/08/21 80    SDOH:  (Social Determinants of Health) assessments and interventions performed:    CCM Care Plan  Review of patient past medical history, allergies, medications, health status, including review of consultants reports, laboratory and other test data, was performed as part of comprehensive evaluation and provision of chronic care management services.   Care Plan : PharmD - Medication Adherence  Updates made by Manuela Neptune, RPH-CPP since 08/02/2021 12:00 AM     Problem: Disease Progression      Long-Range Goal: Disease Progression Prevented or Minimized   Start Date: 11/27/2020  Expected End Date: 02/25/2021  Recent Progress: On track  Priority: High  Note:   Current Barriers:  Unable to self administer medications as prescribed  Pharmacist Clinical Goal(s):  Over the next 90 days, patient will achieve adherence to monitoring guidelines and medication adherence to achieve therapeutic efficacy through collaboration with PharmD and provider.    Interventions: 1:1 collaboration with Smitty Cords, DO regarding development and update of comprehensive plan of care as evidenced by provider attestation and co-signature Inter-disciplinary care team collaboration (see longitudinal plan of care) Perform chart review.  Patient seen for Office Visit with  [facility] on 5/19. Provider advised patient: Order placed for referral to neurosurgery for evaluation Order placed for MRI of the lumbar and cervical spine without contrast. Valium 5 mg 1 to 2 tablets 30 minutes prior to MRI #2 sent to pharmacy as patient is claustrophobic Today reports she is awaiting call back to schedule MRI. Has phone number for imaging and will continue to follow up Encourage patient to call PCP office to schedule annual physical and lab work appointments Reports currently taking gabapentin 300 mg - 1 capsule each morning and 2 capsules at bedtime. Reports not taking midday or higher dose at bedtime as did not see a benefit with taking more  Medication adherence: Confirms using weekly pillbox, but admits to occasionally misses doses if has an appointment Discuss strategies to aid with adherence on these days (timing of dosing to manage diuretic effect)  Hypertension: Current treatment: losartan-HCTZ 100-25 mg daily Reports last checked blood pressure a couple of days ago, but does not recall the reading Encourage patient to continue monitoring home BP and HR 1-2 times week, keep a log of the results and bring this record with her to upcoming appointment with PCP  Type 2 Diabetes: Controlled: current treatment: none Encourage to have regular well-balanced meals, while limiting carbohydrate portion sizes  Hyperlipidemia: Controlled: current treatment: atorvastatin 40 mg daily  Patient Goals/Self-Care Activities Over the next 90 days, patient will:  - focus on medication adherence by:   Using weekly pillbox  Follow Up Plan: Telephone follow up appointment with care management team member scheduled for: 9/6 at 8:30 am       Estelle Grumbles, PharmD, P[place]y [place], CPP Clinical Pharmacist [facility] [phone]

## 2021-08-02 NOTE — Patient Instructions (Signed)
Visit Information  Thank you for taking time to visit with me today. Please don't hesitate to contact me if I can be of assistance to you before our next scheduled telephone appointment.  Following are the goals we discussed today:   Goals Addressed             This Visit's Progress    Pharmacy Goals       Please check your home blood pressure, keep a log of the results and bring this with you to your medical appointments.  To appropriately check your blood pressure, make sure you do the following:  1) Avoid caffeine, exercise, or tobacco products for 30 minutes before checking. Empty your bladder. 2) Sit with your back supported in a flat-backed chair. Rest your arm on something flat (arm of the chair, table, etc). 3) Sit still with your feet flat on the floor, resting, for at least 5 minutes.  4) Check your blood pressure. Take 1-2 readings.  5) Write down these readings and bring with you to any provider appointments.   Make sure you take your blood pressure medications before you come to any office visit, even if you were asked to fast for labs.   Feel free to call me with any questions or concerns. I look forward to our next call!   Wallace Cullens, PharmD, Para March, CPP Clinical Pharmacist [facility] [phone]         Our next appointment is by telephone on 9/6 at 8:30 am  Please call the care guide team at [phone] if you need to cancel or reschedule your appointment.    Patient verbalizes understanding of instructions and care plan provided today and agrees to view in [place]. Active MyChart status and patient understanding of how to access instructions and care plan via MyChart confirmed with patient.

## 2021-08-03 ENCOUNTER — Ambulatory Visit: Payer: Medicare Other | Admitting: Licensed Clinical Social Worker

## 2021-08-03 DIAGNOSIS — M150 Primary generalized (osteo)arthritis: Secondary | ICD-10-CM

## 2021-08-03 DIAGNOSIS — F3342 Major depressive disorder, recurrent, in full remission: Secondary | ICD-10-CM

## 2021-08-03 DIAGNOSIS — F411 Generalized anxiety disorder: Secondary | ICD-10-CM

## 2021-08-03 DIAGNOSIS — M159 Polyosteoarthritis, unspecified: Secondary | ICD-10-CM

## 2021-08-03 DIAGNOSIS — E1169 Type 2 diabetes mellitus with other specified complication: Secondary | ICD-10-CM

## 2021-08-05 ENCOUNTER — Other Ambulatory Visit (HOSPITAL_COMMUNITY): Payer: Self-pay | Admitting: Family Medicine

## 2021-08-05 DIAGNOSIS — M5412 Radiculopathy, cervical region: Secondary | ICD-10-CM

## 2021-08-05 DIAGNOSIS — M5416 Radiculopathy, lumbar region: Secondary | ICD-10-CM

## 2021-08-05 DIAGNOSIS — M5030 Other cervical disc degeneration, unspecified cervical region: Secondary | ICD-10-CM

## 2021-08-05 DIAGNOSIS — M4807 Spinal stenosis, lumbosacral region: Secondary | ICD-10-CM

## 2021-08-08 NOTE — Patient Instructions (Signed)
Visit Information  Thank you for taking time to visit with me today. Please don't hesitate to contact me if I can be of assistance to you before our next scheduled telephone appointment.  Following are the goals we discussed today:  Patient Goals/Self-Care Activities: Over the next 120 days Attend scheduled appts with providers Utilize stress management strategies discussed Contact PCP office with any questions or concerns  Our next appointment is by telephone on 10/26/21 at 10:45 AM  Please call the care guide team at [phone] if you need to cancel or reschedule your appointment.   If you are experiencing a Mental Health or Behavioral Health Crisis or need someone to talk to, please call the Suicide and Crisis Lifeline: 988 call 911   Patient verbalizes understanding of instructions and care plan provided today and agrees to view in MyChart. Active MyChart status and patient understanding of how to access instructions and care plan via MyChart confirmed with patient.     Jenel Lucks, MSW, LCSW [facility] Care Management [place]  [facility] [email or web address] Phone [phone] 8:33 AM

## 2021-08-08 NOTE — Chronic Care Management (AMB) (Signed)
Chronic Care Management    Clinical Social Work Note  08/08/2021 Name: Janet [name] MRN: [id number] DOB: 07/06/1950  Janet [name] is a 71 y.o. year old female who is a primary care patient of Smitty CordsKaramalegos, Alexander J, DO. The CCM team was consulted to assist the patient with chronic disease management and/or care coordination needs related to: Mental Health Counseling and Resources.   Engaged with patient by telephone for follow up visit in response to provider referral for social work chronic care management and care coordination services.   Consent to Services:  The patient was given information about Chronic Care Management services, agreed to services, and gave verbal consent prior to initiation of services.  Please see initial visit note for detailed documentation.   Patient agreed to services and consent obtained.   Assessment: Review of patient past medical history, allergies, medications, and health status, including review of relevant consultants reports was performed today as part of a comprehensive evaluation and provision of chronic care management and care coordination services.     SDOH (Social Determinants of Health) assessments and interventions performed:    Advanced Directives Status: Not addressed in this encounter.  CCM Care Plan  No Known Allergies  Outpatient Encounter Medications as of 08/03/2021  Medication Sig Note   aspirin 81 MG EC tablet Take 81 mg by mouth daily.    atorvastatin (LIPITOR) 40 MG tablet Take 40 mg by mouth at bedtime.    baclofen (LIORESAL) 10 MG tablet TAKE 1 TABLET(10 MG) BY MOUTH AT BEDTIME AS NEEDED FOR MUSCLE SPASMS    DULoxetine (CYMBALTA) 30 MG capsule TAKE 1 CAPSULE(30 MG) BY MOUTH DAILY    gabapentin (NEURONTIN) 300 MG capsule Take 300 mg by mouth 3 (three) times daily. 12/04/2020: Taking 1 capsule (300 mg) in the morning and 2 capsules  (600 mg) at night as directed by Neurologist.   losartan-hydrochlorothiazide (HYZAAR) 100-25 MG tablet  TAKE 1 TABLET BY MOUTH DAILY    meloxicam (MOBIC) 15 MG tablet Take 15 mg by mouth daily as needed. (Patient not taking: Reported on 03/22/2021)    pramipexole (MIRAPEX) 1 MG tablet Take 1 tablet (1 mg total) by mouth 2 (two) times daily.    No facility-administered encounter medications on file as of 08/03/2021.    Patient Active Problem List   Diagnosis Date Noted   Atherosclerosis of left carotid artery 07/20/2021   Chronic left-sided low back pain with left-sided sciatica 04/14/2020   History of deep vein thrombosis 09/16/2019   Type 2 diabetes mellitus with other specified complication (HCC) 08/20/2019   Restless leg syndrome 08/20/2019   Major depression, recurrent, full remission (HCC) 08/20/2019   Other insomnia 08/20/2019   Symptomatic varicose veins, bilateral 08/20/2019   Edema of left lower extremity 08/20/2019   Primary osteoarthritis involving multiple joints 08/20/2019   Venous insufficiency of both lower extremities 08/20/2019   History of bariatric surgery 08/20/2019   Cardiac pacemaker 08/20/2019   Anxiety disorder 10/31/2018   Edema 10/31/2018   Heart murmur 10/31/2018   Osteoporosis 10/31/2018   Primary osteoarthritis of left knee 10/19/2018   History of pacemaker 09/12/2018   Shortness of breath 09/12/2018   Palpitations 06/20/2016   Postmenopausal bleeding 08/04/2015   Lymphedema 12/16/2014   Osteoarthritis 07/31/2014   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   Gastroesophageal reflux disease 07/31/2014   Mixed hyperlipidemia 07/31/2014   Morbid obesity (HCC) 07/31/2014   Obstructive sleep apnea syndrome 07/31/2014   Peripheral nerve disease 07/31/2014   Urinary incontinence 07/31/2014   History  of adenomatous polyp of colon 02/28/2014    Conditions to be addressed/monitored: DMII, Anxiety, Depression, and Osteoarthritis  Care Plan : LCSW Plan of Care  Updates made by Rebekah Chesterfield, LCSW since 08/08/2021 12:00 AM     Problem: Coping Skills (General Plan of Care)       Long-Range Goal: Coping Skills Enhanced   Start Date: 02/24/2021  Expected End Date: 10/28/2021  This Visit's Progress: On track  Recent Progress: On track  Priority: High  Note:   Current barriers:   Chronic Mental Health needs related to Depression and Anxiety Limited social support and Family and relationship dysfunction Needs Support, Education, and Care Coordination in order to meet unmet mental health needs. Clinical Goal(s): demonstrate a reduction in symptoms related to :Depression: anxiety   Clinical Interventions:  Assessed patient's previous and current treatment, coping skills, support system and barriers to care  Patient reports injections were unsuccessful in pain management. She is waiting for a call to schedule two MRIs, which will be reviewed by Neurologist. States she has been waiting for 3 weeks. LCSW strongly encouraged her to contact Neurologist, which she agreed Patient has been assisting adult daughter in caring for spouse, after recovering from a knee replacement three weeks ago  Patient continues to receive strong support from daughters Patient reports improved compliance with medication management and was successful in identifying healthy coping skills  Mindfulness or Relaxation training provided Active listening / Reflection utilized  Emotional Support Provided Provided psychoeducation for mental health needs  Reviewed mental health medications with patient and discussed importance of compliance:  Quality of sleep assessed & Sleep Hygiene techniques promoted  Caregiver stress acknowledged  Participation in counseling encouraged  Participation in support group encouraged  Verbalization of feelings encouraged  ; Review resources and discussed options for any psychosocial needs. None identified at visit 1:1 collaboration with primary care provider regarding development and update of comprehensive plan of care as evidenced by provider attestation and  co-signature Inter-disciplinary care team collaboration (see longitudinal plan of care) Patient Goals/Self-Care Activities: Over the next 120 days Attend scheduled appts with providers Utilize stress management strategies discussed Contact PCP office with any questions or concerns        Follow Up Plan: SW will follow up with patient by phone over the next 6-8 weeks      Christa See, MSW, [email or web address] Phone [phone] 8:31 AM

## 2021-08-09 ENCOUNTER — Ambulatory Visit: Payer: Self-pay

## 2021-08-09 ENCOUNTER — Telehealth: Payer: Medicare Other

## 2021-08-09 DIAGNOSIS — M79641 Pain in right hand: Secondary | ICD-10-CM | POA: Diagnosis not present

## 2021-08-09 DIAGNOSIS — G8929 Other chronic pain: Secondary | ICD-10-CM

## 2021-08-09 DIAGNOSIS — E782 Mixed hyperlipidemia: Secondary | ICD-10-CM

## 2021-08-09 DIAGNOSIS — R29898 Other symptoms and signs involving the musculoskeletal system: Secondary | ICD-10-CM | POA: Diagnosis not present

## 2021-08-09 DIAGNOSIS — E1169 Type 2 diabetes mellitus with other specified complication: Secondary | ICD-10-CM

## 2021-08-09 DIAGNOSIS — F411 Generalized anxiety disorder: Secondary | ICD-10-CM

## 2021-08-09 DIAGNOSIS — M159 Polyosteoarthritis, unspecified: Secondary | ICD-10-CM

## 2021-08-09 DIAGNOSIS — R200 Anesthesia of skin: Secondary | ICD-10-CM | POA: Diagnosis not present

## 2021-08-09 DIAGNOSIS — R202 Paresthesia of skin: Secondary | ICD-10-CM | POA: Diagnosis not present

## 2021-08-09 DIAGNOSIS — M79605 Pain in left leg: Secondary | ICD-10-CM | POA: Diagnosis not present

## 2021-08-09 DIAGNOSIS — M1712 Unilateral primary osteoarthritis, left knee: Secondary | ICD-10-CM

## 2021-08-09 DIAGNOSIS — M79601 Pain in right arm: Secondary | ICD-10-CM | POA: Diagnosis not present

## 2021-08-09 DIAGNOSIS — I10 Essential (primary) hypertension: Secondary | ICD-10-CM

## 2021-08-09 DIAGNOSIS — M79604 Pain in right leg: Secondary | ICD-10-CM | POA: Diagnosis not present

## 2021-08-09 DIAGNOSIS — F3342 Major depressive disorder, recurrent, in full remission: Secondary | ICD-10-CM

## 2021-08-09 DIAGNOSIS — M48061 Spinal stenosis, lumbar region without neurogenic claudication: Secondary | ICD-10-CM | POA: Diagnosis not present

## 2021-08-09 DIAGNOSIS — Z9181 History of falling: Secondary | ICD-10-CM

## 2021-08-09 DIAGNOSIS — G2581 Restless legs syndrome: Secondary | ICD-10-CM

## 2021-08-09 DIAGNOSIS — M79642 Pain in left hand: Secondary | ICD-10-CM | POA: Diagnosis not present

## 2021-08-09 NOTE — Patient Instructions (Signed)
Visit Information  Thank you for taking time to visit with me today. Please don't hesitate to contact me if I can be of assistance to you before our next scheduled telephone appointment.  Following are the goals we discussed today:  Diabetes:  (Status: Goal on track: YES.)      Lab Results  Component Value Date    HGBA1C 5.7 (H) 09/09/2020  08-09-2021: Will have new lab work in July.    Assessed patient's understanding of A1c goal: <7%. 08-09-2021: Knows the goal of A1C less than 7.0%. New labs scheduled for July Provided education to patient about basic DM disease process. 08-09-2021: The patient denies any issues with management of her DM. She states she is doing well. Reviewed prescribed diet with patient 11-16-2020: Review of heart healthy/ADA diet. The patient eats small meals and sometimes is not eating what she should. Sometimes eats a bedtime snack sometimes not. The patient encouraged to follow a heart healthy/ADA diet. Will send information by my chart system for review and help with meal choices and options. 11-30-2020: Is eating well and denies any issues with compliance with heart healthy/ADA diet. 01-25-2021: Is in [place] taking care of her elderly father. She states she is actually doing very well right now. Denies any acute issues with management of DM or following dietary restrictions. 03-08-2021: The patient feels better and is happy that she is moved and into the new house with her daughter. The patient was in the office today for follow up of left lower leg cellulitis and states that her blood sugars are WNL and she is eating better. 06-07-2021: The patient states she is doing well and eating well. Denies any issues with her dietary intake. 08-09-2021: The patient was away for about 3 weeks helping take care of her husband after knee replacement surgery. She is back home and back on her regular dietary intake and eating much better now. She denies any issues with dietary restrictions.  ; Counseled on importance of regular laboratory monitoring as prescribed. 08-09-2021: The patient keeps appointments and has regular lab work.  Will have lab work done in July and her yearly physical       Discussed plans with patient for ongoing care management follow up and provided patient with direct contact information for care management team;      Provided patient with written educational materials related to hypo and hyperglycemia and importance of correct treatment. 11-16-2020: Reviewed with the patient the sx and sx of hypo and hyperglycemia. The patient does not check her blood sugars as recommended by the provider. She does have a meter but does not take medications for DM and mainly controls DM with diet. Education and support given. The patient states that she will start taking blood sugars if she feels different. Denies lows; however states that sometimes at night she experiences "sweating". Eduction and support given. Will continue to monitor. 11-30-2020: The patient denies any lows at this time. States her blood sugar this am was 109. States she is doing better with management of her overall health and well being. Will continue to monitor.   03-08-2021: Saw patient face to face and provided a calendar for her with places to record blood sugar and blood pressure readings. Also gave her the healthy eating handout for reference and review. The patient has the [facility] new telephone number for needs and support between regular outreaches.  08-09-2021: The patient states her blood sugars are doing well. She denies any new concerns  with her DM.   Knows how to effectively mange lows and highs.  Review of patient status, including review of consultants reports, relevant laboratory and other test results, and medications completed;       Eye exam last week (Today's date 06-07-2021). Will pick up new glasses on 06-15-2021   Falls:  (Status: Goal on track: YES.) Provided written and verbal education re: potential  causes of falls and Fall prevention strategies Reviewed medications and discussed potential side effects of medications such as dizziness and frequent urination. 11-16-2020: The patient reviewed with the RNCM medications that she takes. Is following up with the pharm D as she forgets to take medications. Will monitor for medications that may cause her to have issues with fall hazards. 01-25-2021: Working with the Pharm D for medications management, questions and concerns. 08-09-2021: Ongoing support and education for medication needs from pharm D and RNCM.  Advised patient of importance of notifying provider of falls. 08-09-2021: Knows to call the office for new falls or issues with safety. Reviewed with the patient today.  Assessed for signs and symptoms of orthostatic hypotension. 9-19-202: Denies any issues with orthostatic hypotension. Takes blood pressures sometimes at home. Advised the patient to take blood pressures and record. 08-09-2021: Denies any low blood pressures at this time. Assessed for falls since last encounter. 11-16-2020: The patient states that she has a fall about once a month. Last fall earlier this month. States no injuries. The patient states that she uses a cane when ambulating but sometimes falls because she doesn't use her cane. She is always cooking and cleaning and doing for others. She states its easy for her not to take good care of herself. 06-07-2021: The patient denies any new falls. Review of safety concerns and being cautious to prevent falls and injuries. Likely will close goal at next outreach if no new falls. 08-09-2021: The patient denies any new falls but states she has had some near misses. Is having a lot of pain in her back and legs. Will leave goal open a little longer at this time to effectively manage fall and safety risk.  Assessed patients knowledge of fall risk prevention secondary to previously provided education Advised patient to discuss new falls, concerns, or  questions  with provider   Depression and anxiety   (Status: Goal on track: YES.) Evaluation of current treatment plan related to Anxiety and Depression, Limited social support, Level of care concerns, Mental Health Concerns , and Family and relationship dysfunction self-management and patient's adherence to plan as established by provider.  01-25-2021: The patient states that she is doing well right now. She is actually in [place] and she will be there until the end of December or later taking care of her elderly father. Review of medications and chronic conditions and the patient is doing well.  She states she is not stressed out and has her medications. Denies any new concerns. 03-08-2021: The patient states she is doing well. Her father is better and she is now home from [place]. She got completely moved into her daughters house this past weekend and she feels like this will be a positive and good change for her. She is resting well and getting her health under control. She denies any acute findings related to mental health today. 04-05-2021: The patient is doing well and happy. She states she will see the specialist for her back pain this week. The patient states she is happy with new living arrangements. 06-07-2021: The  patient has been doing okay. Was in urgent care recently for running a high fever and also sore throat. She has been taking an abx and is feeling better. She has several MD appointments and states that she will see the orthopedic provider on 06-16-2021 for injections in her back. If the injections do not work then she will have to have surgery. Her daughter has been away but is coming back next week. She states they live together well. 08-09-2021: The patient states that she is "so-so". She is thankful to be back home to her home. She had been taking care of her husband post knee replacement. She is now back to her home and getting into her routine and focusing on self care and eating  better. She is concerned about the back and leg pain she is experiencing. Has several test coming up to evaluate her pain and discomfort. See pain plan of care for more details. Discussed plans with patient for ongoing care management follow up and provided patient with direct contact information for care management team Advised patient to call the office for changes in mood, axiety, depression or other mental health concerns; Provided education to patient re: managing stressors in life and working with the CCM team to effectively manage her depression and anxiety ; Reviewed medications with patient and discussed compliance. The patient sometimes forgets to take her medications. Is open to ideas to help her with effective management of medications. 11-30-2020: Is doing better with management of her medications. Working with the pharm D and also setting alarms on her phone for 2 times a day to help her remember to take her medications. 08-09-2021: Endorses compliance with medications. States she has her medications and denies any concerns with medications at this time. Will continue to monitor. Is more compliant with medications; Collaborated with CCM team  regarding complex health needs and the patient wanting help in management of her chronic conditions. The patient states that her and her youngest daughter are going to be moving in about a month. She feels this will help her a whole lot as she is really stressed over cooking, cleaning and helping care for so many family members. She often feels overwhelmed and knows she is not taking care of herself. 11-30-2020: The patient is excited about moving in with her daughter. Her daughter closes on her new house on Friday and the patient will be moving in with her. She states she will still come and help some at her current home but it will be a lot less stressful on her. She is optimistic. Review of self care.  01-25-2021: The patient is doing well currently. Is in  [place] taking care of her elderly father. Does not know when she will return back to [place]. Likely at the end of December. 08-09-2021; The patient is doing well and denies any issues with her mental health and well being. Has support from the CCM team and is thankful for the support and ongoing education. Is thankful to be back at her home where she is back in her normal routine after helping out with her husband post knee replacement surgery. Provided patient with depression and anxiety  educational materials related to relieving stressors in her life; Social Work referral for help with ongoing support and stress relief measures for effective management of mental health well being. 08-09-2021: Ongoing support and education from the LCSW. Has been in contact with LCSW recently ; Pharmacy referral for help with management of medications and  help with system that will help her. 08-09-2021: Continues to work with the [place] D on a regular basis. Has been in contact with the pharm D recently; Discussed plans with patient for ongoing care management follow up and provided patient with direct contact information for care management team; Advised patient to discuss new changes and stressors  with provider; Screening for signs and symptoms of depression related to chronic disease state;  Assessed social determinant of health barriers;    Hyperlipidemia:  (Status: Goal on track: YES.)      Lab Results  Component Value Date    CHOL 133 09/09/2020    HDL 52 09/09/2020    LDLCALC 67 09/09/2020    TRIG 56 09/09/2020    CHOLHDL 2.6 09/09/2020   New labs in July of 2023   Medication review performed; medication list updated in electronic medical record. 08-09-2021: The patient is taking Lipitor 40 mg QD. Has no issues with medications compliance. Provider established cholesterol goals reviewed. 08-09-2021: Is at goal with cholesterol levels.; Counseled on importance of regular laboratory monitoring as prescribed.  08-09-2021: Review of having regular lab work. New lab work scheduled for July 2023. Provided HLD educational materials; Reviewed role and benefits of statin for ASCVD risk reduction; Discussed strategies to manage statin-induced myalgias; Reviewed importance of limiting foods high in cholesterol. 04-05-2021: Education and handouts provided at today's visit for healthy eating with information on HLD, HTN, and heart health calendar. Reviewed exercise goals and target of 150 minutes per week;       Hypertension: (Status: Goal on Track (progressing): YES.) Long Term Goal  Last practice recorded BP readings:     BP Readings from Last 3 Encounters:  08/09/21 137/85  06/17/21 112/71  05/03/21 (!) 147/85  Most recent eGFR/CrCl:       Lab Results  Component Value Date    EGFR 93 09/09/2020    No components found for: CRCL   Evaluation of current treatment plan related to hypertension self management and patient's adherence to plan as established by provider. 03-08-2021; The patient states that she is doing well with her blood pressures at home and is not under the stress that she was with caring for her elderly father and other family members. The patient will elevation in the office, may be somewhat related to "white-coat" syndrome. Education and support given. 04-05-2021: The patient states that her blood pressures have been good and she has not had any high blood pressures. 06-07-2021: The patient has been having fluctuations in  her blood pressures. She has had several things going on and was in urgent care recently with headache, fever, and sore throat. Was placed on an abx. The patient is compliant with abx therapy. The patient states that she is doing better. She has an appointment tomorrow to see about her [facility], dentist on Wednesday and Thursday, cardiologist on Friday and orthopedic provider next week. Had eye exam last week. Denies any acute distress today. 08-09-2021: Blood pressures are more stable  now. The patient did state that her blood pressure was a little high last week but she was having a lot of pain. The patient does check her blood pressure at home at times. Today at neurology office it was 137/85 with pulse of 71. Provided education to patient re: stroke prevention, s/s of heart attack and stroke. 08-09-2021: Review  Reviewed prescribed diet heart healthy/ADA diet  03-08-2021: Review and handout provided with educational information. 08-09-2021: The patient states that she is doing well  with her dietary restrictions. Eating better since she has gotten back to her home where there are better and healthier food options.  Reviewed medications with patient and discussed importance of compliance. 08-09-2021: The patient is compliant with medications  Discussed plans with patient for ongoing care management follow up and provided patient with direct contact information for care management team; Advised patient, providing education and rationale, to monitor blood pressure daily and record, calling PCP for findings outside established parameters;  Advised patient to discuss changes in blood pressures, excessive highs, questions, or concerns, with provider; Provided education on prescribed diet heart healthy/ADA ;  Discussed complications of poorly controlled blood pressure such as heart disease, stroke, circulatory complications, vision complications, kidney impairment, sexual dysfunction;       Pain:  (Status: Goal on track: YES.) Pain assessment performed. 04-05-2021: Rates her pain level at a 5 today in her back. The patient will see the specialist this week on Wednesday to evaluate her back pain and discomfort. 06-07-2021: The patient is rating her back pain at a 5 today on a scale of 0-10. Sees the specialist tomorrow for her RLS, sees the orthopedic provider for her back pain on 06-16-2021 and vascular surgeon on 06-17-2021. The patient wants to talk to the specialist tomorrow about gabapentin as if  she does not take it she can not sleep. She does not like that she is dependent on it to help her rest. Education and support given. Will continue to monitor for changes.    08-09-2021: The patient states that she is having pain still in her back and legs. She rates the pain at a 5/6 today. She saw the neurologist today and had medication changes.           Medications reviewed. 08-09-2021: Takes medications as directed. Review of medication changes and the patient is hopeful this will help with her back and RLS. Reviewed provider established plan for pain management. 11-16-2020: the patient states that she does take Tylenol for pain relief and discomfort at times. She stays very busy and she states she feels somewhat overwhelmed. She rates her pain today at a 4 on a scale of 0-10. 01-25-2021: Is effectively managing pain at this time. Denies any acute findings. Will continue to monitor. 03-08-2021: The patient has been dealing with cellulitis of left lower leg and was seen in ER on 02-26-2021, Urgent care on 03-01-2021 and pcp today. Per the patient the area is looing better and resolving. Will work with pcp for continued recommendations and support. 04-05-2021: The patient is going to the spine specialist this week to be evaluated for back pain on Wednesday. Called the Vein and Vascular specialist with the patient and secured the patient an appointment for March 6 at 0730 am with the specialist. The patient has not been there since 2021. She did see the neurologist about her RLS earlier in the month. She is thankful for the support of the CCM team. 06-07-2021: Is seeing several specialist for help with her chronic conditions. 08-09-2021: The patient states that she is going in August for MRI's to see if they can pinpoint the specific problem. She saw the neurologist today and has had changes in her medications. She is hopeful this will help with her pain levels as sometimes it gets really bad.  Discussed importance of  adherence to all scheduled medical appointments. 04-07-2021 with spine specialist. The patient has an appointment with the Vascular provider on 05-03-2021 at 730 am. Will call pcp for  new changes or needs.  Knows to call for changes or needs. 06-07-2021: Eye exam last week, urgent care recently for sore throat and fever with headache, sees specialist on 06-08-2021 for RLS, dentist on 4-12 and 4-13, cardiologist on 4-14, orthopedic provider on 4-19 and vascular surgeon on 06-16-2021. Knows to call pcp for changes or new needs and concerns. 08-09-2021: Saw neurologist today and has upcoming appointments in July with the pcp and MRI scheduled for 09-30-2021. Counseled on the importance of reporting any/all new or changed pain symptoms or management strategies to pain management provider; Advised patient to report to care team affect of pain on daily activities; Discussed use of relaxation techniques and/or diversional activities to assist with pain reduction (distraction, imagery, relaxation, massage, acupressure, TENS, heat, and cold application; Reviewed with patient prescribed pharmacological and nonpharmacological pain relief strategies; Advised patient to discuss call the provider for changes in level or intensity of pain and discomfort with provider;     Left lower leg cellulitis   (Status: Goal Met.) Short Term Goal  08-09-2021: Closing this goal. Goal has been met. Evaluation of current treatment plan related to  Left lower leg cellulitis ,  self-management and patient's adherence to plan as established by provider. 04-05-2021: The patient will see the vascular provider on May 03, 2021 at 20 for evaluation of bilateral statis dermatitis and venous insufficiency. The patient states the cellulitis is better in her left leg but still having issues with this and states she had called several times and left messages for them to call her back and schedule an appointment. RNCM called with the patient today to schedule  an appointment with the vein and vascular. Appointment secured for 05-03-2021 at 0730 am. 06-07-2021: The patient states she is stable. She follows up with vascular provider on 06-17-2021 and Dr. Jorja Loa tomorrow for RLS. She is compliant with the plan of care.  Discussed plans with patient for ongoing care management follow up and provided patient with direct contact information for care management team Advised patient to call the office for changes in cellulitis to left lower leg, drainage, sx and sx of infection, questions, or concerns; Provided education to patient re: monitoring for changes to site, monitoring for sx and sx of infection, hygiene and care to the infected area,; Reviewed medications with patient and discussed compliance and to take all antibiotics until course is done. ; Reviewed scheduled/upcoming provider appointments including saw pcp today in the office. 06-07-2021: Upcoming appointment 06-17-2021 for evaluation at vein and vascular specialist; Discussed plans with patient for ongoing care management follow up and provided patient with direct contact information for care management team; Advised patient to discuss changes in the are of the cellulitis, questions, or concerns with provider;   Our next appointment is by telephone on 10-11-2021 at 1 pm  Please call the care guide team at [phone] if you need to cancel or reschedule your appointment.   If you are experiencing a Mental Health or [place] or need someone to talk to, please call the Suicide and Crisis Lifeline: 988 call the [place] National Suicide Prevention Lifeline: 929-086-3900 or TTY: 858-360-8429 TTY (214)319-1318) to talk to a trained counselor call 1-800-273-TALK (toll free, 24 hour hotline)   Patient verbalizes understanding of instructions and care plan provided today and agrees to view in [place]. Active MyChart status and patient understanding of how to access instructions and care plan via  MyChart confirmed with patient.     Noreene Larsson RN, MSN, CCM  [place] Mobile: [phone]

## 2021-08-09 NOTE — Chronic Care Management (AMB) (Signed)
Chronic Care Management   CCM RN Visit Note  08/09/2021 Name: Janet [name] MRN: [id number] DOB: Sep 09, 1950  Subjective: Janet [name] is a 71 y.o. year old female who is a primary care patient of Olin Hauser, DO. The care management team was consulted for assistance with disease management and care coordination needs.    Engaged with patient by telephone for follow up visit in response to provider referral for case management and/or care coordination services.   Consent to Services:  The patient was given information about Chronic Care Management services, agreed to services, and gave verbal consent prior to initiation of services.  Please see initial visit note for detailed documentation.   Patient agreed to services and verbal consent obtained.   Assessment: Review of patient past medical history, allergies, medications, health status, including review of consultants reports, laboratory and other test data, was performed as part of comprehensive evaluation and provision of chronic care management services.   SDOH (Social Determinants of Health) assessments and interventions performed:  SDOH Interventions    Flowsheet Row Most Recent Value  SDOH Interventions   Housing Interventions Intervention Not Indicated, Other (Comment)  [has moved into a home with one of her daughters. Is doing much better]        CCM Care Plan  No Known Allergies  Outpatient Encounter Medications as of 08/09/2021  Medication Sig Note   aspirin 81 MG EC tablet Take 81 mg by mouth daily.    atorvastatin (LIPITOR) 40 MG tablet Take 40 mg by mouth at bedtime.    baclofen (LIORESAL) 10 MG tablet TAKE 1 TABLET(10 MG) BY MOUTH AT BEDTIME AS NEEDED FOR MUSCLE SPASMS    DULoxetine (CYMBALTA) 30 MG capsule TAKE 1 CAPSULE(30 MG) BY MOUTH DAILY    gabapentin (NEURONTIN) 300 MG capsule Take 300 mg by mouth 3 (three) times daily. 12/04/2020: Taking 1 capsule (300 mg) in the morning and 2 capsules  (600 mg) at  night as directed by Neurologist.   losartan-hydrochlorothiazide (HYZAAR) 100-25 MG tablet TAKE 1 TABLET BY MOUTH DAILY    meloxicam (MOBIC) 15 MG tablet Take 15 mg by mouth daily as needed. (Patient not taking: Reported on 03/22/2021)    pramipexole (MIRAPEX) 1 MG tablet Take 1 tablet (1 mg total) by mouth 2 (two) times daily.    No facility-administered encounter medications on file as of 08/09/2021.    Patient Active Problem List   Diagnosis Date Noted   Atherosclerosis of left carotid artery 07/20/2021   Chronic left-sided low back pain with left-sided sciatica 04/14/2020   History of deep vein thrombosis 09/16/2019   Type 2 diabetes mellitus with other specified complication ([place]) 38/17/7116   Restless leg syndrome 08/20/2019   Major depression, recurrent, full remission ([place]) 08/20/2019   Other insomnia 08/20/2019   Symptomatic varicose veins, bilateral 08/20/2019   Edema of left lower extremity 08/20/2019   Primary osteoarthritis involving multiple joints 08/20/2019   Venous insufficiency of both lower extremities 08/20/2019   History of bariatric surgery 08/20/2019   Cardiac pacemaker 08/20/2019   Anxiety disorder 10/31/2018   Edema 10/31/2018   Heart murmur 10/31/2018   Osteoporosis 10/31/2018   Primary osteoarthritis of left knee 10/19/2018   History of pacemaker 09/12/2018   Shortness of breath 09/12/2018   Palpitations 06/20/2016   Postmenopausal bleeding 08/04/2015   Lymphedema 12/16/2014   Osteoarthritis 07/31/2014   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   Gastroesophageal reflux disease 07/31/2014   Mixed hyperlipidemia 07/31/2014   Morbid obesity ([place]) 07/31/2014  Obstructive sleep apnea syndrome 07/31/2014   Peripheral nerve disease 07/31/2014   Urinary incontinence 07/31/2014   History of adenomatous polyp of colon 02/28/2014    Conditions to be addressed/monitored:HTN, HLD, DMII, Anxiety, Depression, and RLS, Chronic pain, and falls and safety  Care Plan : RNCM:  Adult plan of care for  Chronic Disease Management and Care Coordination Needs  Updates made by Vanita Ingles, RN since 08/09/2021 12:00 AM     Problem: RNCM: Adult plan of care for Chronic Disease Management and Care Coordination Needs   Priority: High  Onset Date: 11/16/2020     Long-Range Goal: RNCM: Adult Plan of Care for Chronic Disease Management and Care Coordination Needs   Start Date: 11/16/2020  Expected End Date: 11/16/2021  Recent Progress: On track  Priority: High  Note:   Current Barriers:  Knowledge Deficits related to plan of care for management of HTN, HLD, DMII, Depression, Anxiety with family circumstances and not caring for self, Depression: depressed mood, and chronic pain (RLS, OA, Chronic back pain),l and falls and safety concerns insomnia, fatigue, anxiety, disturbed sleep,, Caregiver Stress, Grief, Insomnia/Sleep Difficulties, and Stress at family circumstances, and grief process over the loss of her oldest daughter to [place] in October 2021  Care Coordination needs related to Limited social support, Level of care concerns, Mental Health Concerns , and Family and relationship dysfunction  Chronic Disease Management support and education needs related to HTN,  HLD, DMII, Depression, Anxiety with Social Anxiety,, Depression: depressed mood, falls prevention and safety and chronic pain  insomnia, fatigue, anxiety,, Caregiver Stress, Grief, Mood Instability, and Stress at family situations and circumstances, and grief at the loss of her oldest daughter last year in October of 2021 Lacks caregiver support.  Non-adherence to prescribed medication regimen 03-08-2021: New onset of cellulitis to left leg first seen in ER on 02-26-2021, follow up with pcp on 03-08-2021- 06-07-2021- resolved  RNCM Clinical Goal(s):  Patient will verbalize understanding of plan for management of HTN, HLD, DMII, Anxiety, Depression,  falls prevention, and Chronic pain  verbalize basic understanding  of HLD, DMII, Anxiety, Depression, falls prevention, and Chronic pain  disease process and self health management plan  take all medications exactly as prescribed and will call provider for medication related questions demonstrate understanding of rationale for each prescribed medication patient desires to work with pharm D for effective management of chronic conditions and how to take her medications as prescribed demonstrate improved and ongoing adherence to prescribed treatment plan for HTN, HLD, DMII, Anxiety, Depression, falls prevention, and chronic pain  as evidenced by adherence to ADA/ carb modified diet adherence to prescribed medication regimen contacting provider for new or worsened symptoms or questions and working with the CCM team for effective management of chronic diseases  demonstrate improved and ongoing health management independence by remaining safe in home environment and effective management of Chronic conditions  continue to work with Consulting civil engineer to address care management and care coordination needs related to HLD, DMII, Anxiety, Depression, and chronic pain and fall prevention   work with pharmacist to address medication concerns as the patient admits she does not take her medications as prescribed and struggles with managing her medications related to HTN, HLD, DMII, Anxiety, Depression, and chronic pain and falls prevention and safety through collaboration with RN Care manager, provider, and care team.   Interventions: 1:1 collaboration with primary care provider regarding development and update of comprehensive plan of care as evidenced by provider attestation and co-signature  Inter-disciplinary care team collaboration (see longitudinal plan of care) Evaluation of current treatment plan related to  self management and patient's adherence to plan as established by provider   SDOH Barriers (Status: Goal on track: YES.)  Patient interviewed and SDOH assessment  performed        SDOH Interventions    Flowsheet Row Most Recent Value  SDOH Interventions   Housing Interventions Intervention Not Indicated, Other (Comment)  [has moved into a home with one of her daughters. Is doing much better]     Patient interviewed and appropriate assessments performed Provided patient with information about CCM team and working with the patient to manage chronic conditions and effectively manage health and well being. The patient wants to be proactive in her care and is willing to work with the team Discussed plans with patient for ongoing care management follow up and provided patient with direct contact information for care management team Advised patient to call the office for changes in condition or SDOH needs. The patient states that she is hoping by working with the CCM team she can remember best how to take her medications and take care of herself beter.  Collaborated with RN Case Manager re: ongoing support and education for management of chronic conditions and health and well being goals. 03-08-2021: The patient has moved into a house with her daughter in [place] and is doing very well. She had been out of town caring for her elderly father. She is doing well and in the office for a follow up today of left lower leg cellulitis. 08-09-2021: The patient had went to help take care of her husband who had knee replacement surgery but she is back home now and happy as she is eating better and sleeping better now.     Diabetes:  (Status: Goal on track: YES.) Lab Results  Component Value Date   HGBA1C 5.7 (H) 09/09/2020  08-09-2021: Will have new lab work in July.   Assessed patient's understanding of A1c goal: <7%. 08-09-2021: Knows the goal of A1C less than 7.0%. New labs scheduled for July Provided education to patient about basic DM disease process. 08-09-2021: The patient denies any issues with management of her DM. She states she is doing well. Reviewed  prescribed diet with patient 11-16-2020: Review of heart healthy/ADA diet. The patient eats small meals and sometimes is not eating what she should. Sometimes eats a bedtime snack sometimes not. The patient encouraged to follow a heart healthy/ADA diet. Will send information by my chart system for review and help with meal choices and options. 11-30-2020: Is eating well and denies any issues with compliance with heart healthy/ADA diet. 01-25-2021: Is in [place] taking care of her elderly father. She states she is actually doing very well right now. Denies any acute issues with management of DM or following dietary restrictions. 03-08-2021: The patient feels better and is happy that she is moved and into the new house with her daughter. The patient was in the office today for follow up of left lower leg cellulitis and states that her blood sugars are WNL and she is eating better. 06-07-2021: The patient states she is doing well and eating well. Denies any issues with her dietary intake. 08-09-2021: The patient was away for about 3 weeks helping take care of her husband after knee replacement surgery. She is back home and back on her regular dietary intake and eating much better now. She denies any issues with dietary restrictions. ; Counseled on  importance of regular laboratory monitoring as prescribed. 08-09-2021: The patient keeps appointments and has regular lab work.  Will have lab work done in July and her yearly physical       Discussed plans with patient for ongoing care management follow up and provided patient with direct contact information for care management team;      Provided patient with written educational materials related to hypo and hyperglycemia and importance of correct treatment. 11-16-2020: Reviewed with the patient the sx and sx of hypo and hyperglycemia. The patient does not check her blood sugars as recommended by the provider. She does have a meter but does not take medications for DM and  mainly controls DM with diet. Education and support given. The patient states that she will start taking blood sugars if she feels different. Denies lows; however states that sometimes at night she experiences "sweating". Eduction and support given. Will continue to monitor. 11-30-2020: The patient denies any lows at this time. States her blood sugar this am was 109. States she is doing better with management of her overall health and well being. Will continue to monitor.   03-08-2021: Saw patient face to face and provided a calendar for her with places to record blood sugar and blood pressure readings. Also gave her the healthy eating handout for reference and review. The patient has the [facility] new telephone number for needs and support between regular outreaches.  08-09-2021: The patient states her blood sugars are doing well. She denies any new concerns with her DM.   Knows how to effectively mange lows and highs.  Review of patient status, including review of consultants reports, relevant laboratory and other test results, and medications completed;       Eye exam last week (Today's date 06-07-2021). Will pick up new glasses on 06-15-2021  Falls:  (Status: Goal on track: YES.) Provided written and verbal education re: potential causes of falls and Fall prevention strategies Reviewed medications and discussed potential side effects of medications such as dizziness and frequent urination. 11-16-2020: The patient reviewed with the RNCM medications that she takes. Is following up with the pharm D as she forgets to take medications. Will monitor for medications that may cause her to have issues with fall hazards. 01-25-2021: Working with the Pharm D for medications management, questions and concerns. 08-09-2021: Ongoing support and education for medication needs from pharm D and RNCM.  Advised patient of importance of notifying provider of falls. 08-09-2021: Knows to call the office for new falls or issues with safety.  Reviewed with the patient today.  Assessed for signs and symptoms of orthostatic hypotension. 9-19-202: Denies any issues with orthostatic hypotension. Takes blood pressures sometimes at home. Advised the patient to take blood pressures and record. 08-09-2021: Denies any low blood pressures at this time. Assessed for falls since last encounter. 11-16-2020: The patient states that she has a fall about once a month. Last fall earlier this month. States no injuries. The patient states that she uses a cane when ambulating but sometimes falls because she doesn't use her cane. She is always cooking and cleaning and doing for others. She states its easy for her not to take good care of herself. 06-07-2021: The patient denies any new falls. Review of safety concerns and being cautious to prevent falls and injuries. Likely will close goal at next outreach if no new falls. 08-09-2021: The patient denies any new falls but states she has had some near misses. Is having a lot of  pain in her back and legs. Will leave goal open a little longer at this time to effectively manage fall and safety risk.  Assessed patients knowledge of fall risk prevention secondary to previously provided education Advised patient to discuss new falls, concerns, or questions  with provider  Depression and anxiety   (Status: Goal on track: YES.) Evaluation of current treatment plan related to Anxiety and Depression, Limited social support, Level of care concerns, Mental Health Concerns , and Family and relationship dysfunction self-management and patient's adherence to plan as established by provider.  01-25-2021: The patient states that she is doing well right now. She is actually in [place] and she will be there until the end of December or later taking care of her elderly father. Review of medications and chronic conditions and the patient is doing well.  She states she is not stressed out and has her medications. Denies any new concerns.  03-08-2021: The patient states she is doing well. Her father is better and she is now home from [place]. She got completely moved into her daughters house this past weekend and she feels like this will be a positive and good change for her. She is resting well and getting her health under control. She denies any acute findings related to mental health today. 04-05-2021: The patient is doing well and happy. She states she will see the specialist for her back pain this week. The patient states she is happy with new living arrangements. 06-07-2021: The patient has been doing okay. Was in urgent care recently for running a high fever and also sore throat. She has been taking an abx and is feeling better. She has several MD appointments and states that she will see the orthopedic provider on 06-16-2021 for injections in her back. If the injections do not work then she will have to have surgery. Her daughter has been away but is coming back next week. She states they live together well. 08-09-2021: The patient states that she is "so-so". She is thankful to be back home to her home. She had been taking care of her husband post knee replacement. She is now back to her home and getting into her routine and focusing on self care and eating better. She is concerned about the back and leg pain she is experiencing. Has several test coming up to evaluate her pain and discomfort. See pain plan of care for more details. Discussed plans with patient for ongoing care management follow up and provided patient with direct contact information for care management team Advised patient to call the office for changes in mood, axiety, depression or other mental health concerns; Provided education to patient re: managing stressors in life and working with the CCM team to effectively manage her depression and anxiety ; Reviewed medications with patient and discussed compliance. The patient sometimes forgets to take her medications. Is open to  ideas to help her with effective management of medications. 11-30-2020: Is doing better with management of her medications. Working with the pharm D and also setting alarms on her phone for 2 times a day to help her remember to take her medications. 08-09-2021: Endorses compliance with medications. States she has her medications and denies any concerns with medications at this time. Will continue to monitor. Is more compliant with medications; Collaborated with CCM team  regarding complex health needs and the patient wanting help in management of her chronic conditions. The patient states that her and her youngest daughter are going  to be moving in about a month. She feels this will help her a whole lot as she is really stressed over cooking, cleaning and helping care for so many family members. She often feels overwhelmed and knows she is not taking care of herself. 11-30-2020: The patient is excited about moving in with her daughter. Her daughter closes on her new house on Friday and the patient will be moving in with her. She states she will still come and help some at her current home but it will be a lot less stressful on her. She is optimistic. Review of self care.  01-25-2021: The patient is doing well currently. Is in [place] taking care of her elderly father. Does not know when she will return back to [place]. Likely at the end of December. 08-09-2021; The patient is doing well and denies any issues with her mental health and well being. Has support from the CCM team and is thankful for the support and ongoing education. Is thankful to be back at her home where she is back in her normal routine after helping out with her husband post knee replacement surgery. Provided patient with depression and anxiety  educational materials related to relieving stressors in her life; Social Work referral for help with ongoing support and stress relief measures for effective management of mental health well being. 08-09-2021:  Ongoing support and education from the LCSW. Has been in contact with LCSW recently ; Pharmacy referral for help with management of medications and help with system that will help her. 08-09-2021: Continues to work with the [place] D on a regular basis. Has been in contact with the pharm D recently; Discussed plans with patient for ongoing care management follow up and provided patient with direct contact information for care management team; Advised patient to discuss new changes and stressors  with provider; Screening for signs and symptoms of depression related to chronic disease state;  Assessed social determinant of health barriers;   Hyperlipidemia:  (Status: Goal on track: YES.) Lab Results  Component Value Date   CHOL 133 09/09/2020   HDL 52 09/09/2020   LDLCALC 67 09/09/2020   TRIG 56 09/09/2020   CHOLHDL 2.6 09/09/2020   New labs in July of 2023  Medication review performed; medication list updated in electronic medical record. 08-09-2021: The patient is taking Lipitor 40 mg QD. Has no issues with medications compliance. Provider established cholesterol goals reviewed. 08-09-2021: Is at goal with cholesterol levels.; Counseled on importance of regular laboratory monitoring as prescribed. 08-09-2021: Review of having regular lab work. New lab work scheduled for July 2023. Provided HLD educational materials; Reviewed role and benefits of statin for ASCVD risk reduction; Discussed strategies to manage statin-induced myalgias; Reviewed importance of limiting foods high in cholesterol. 04-05-2021: Education and handouts provided at today's visit for healthy eating with information on HLD, HTN, and heart health calendar. Reviewed exercise goals and target of 150 minutes per week;    Hypertension: (Status: Goal on Track (progressing): YES.) Long Term Goal  Last practice recorded BP readings:  BP Readings from Last 3 Encounters:  08/09/21 137/85  06/17/21 112/71  05/03/21 (!) 147/85  Most  recent eGFR/CrCl:  Lab Results  Component Value Date   EGFR 93 09/09/2020    No components found for: CRCL  Evaluation of current treatment plan related to hypertension self management and patient's adherence to plan as established by provider. 03-08-2021; The patient states that she is doing well with her blood pressures at home  and is not under the stress that she was with caring for her elderly father and other family members. The patient will elevation in the office, may be somewhat related to "white-coat" syndrome. Education and support given. 04-05-2021: The patient states that her blood pressures have been good and she has not had any high blood pressures. 06-07-2021: The patient has been having fluctuations in  her blood pressures. She has had several things going on and was in urgent care recently with headache, fever, and sore throat. Was placed on an abx. The patient is compliant with abx therapy. The patient states that she is doing better. She has an appointment tomorrow to see about her [facility], dentist on Wednesday and Thursday, cardiologist on Friday and orthopedic provider next week. Had eye exam last week. Denies any acute distress today. 08-09-2021: Blood pressures are more stable now. The patient did state that her blood pressure was a little high last week but she was having a lot of pain. The patient does check her blood pressure at home at times. Today at neurology office it was 137/85 with pulse of 71. Provided education to patient re: stroke prevention, s/s of heart attack and stroke. 08-09-2021: Review  Reviewed prescribed diet heart healthy/ADA diet  03-08-2021: Review and handout provided with educational information. 08-09-2021: The patient states that she is doing well with her dietary restrictions. Eating better since she has gotten back to her home where there are better and healthier food options.  Reviewed medications with patient and discussed importance of compliance. 08-09-2021: The  patient is compliant with medications  Discussed plans with patient for ongoing care management follow up and provided patient with direct contact information for care management team; Advised patient, providing education and rationale, to monitor blood pressure daily and record, calling PCP for findings outside established parameters;  Advised patient to discuss changes in blood pressures, excessive highs, questions, or concerns, with provider; Provided education on prescribed diet heart healthy/ADA ;  Discussed complications of poorly controlled blood pressure such as heart disease, stroke, circulatory complications, vision complications, kidney impairment, sexual dysfunction;     Pain:  (Status: Goal on track: YES.) Pain assessment performed. 04-05-2021: Rates her pain level at a 5 today in her back. The patient will see the specialist this week on Wednesday to evaluate her back pain and discomfort. 06-07-2021: The patient is rating her back pain at a 5 today on a scale of 0-10. Sees the specialist tomorrow for her RLS, sees the orthopedic provider for her back pain on 06-16-2021 and vascular surgeon on 06-17-2021. The patient wants to talk to the specialist tomorrow about gabapentin as if she does not take it she can not sleep. She does not like that she is dependent on it to help her rest. Education and support given. Will continue to monitor for changes.    08-09-2021: The patient states that she is having pain still in her back and legs. She rates the pain at a 5/6 today. She saw the neurologist today and had medication changes.           Medications reviewed. 08-09-2021: Takes medications as directed. Review of medication changes and the patient is hopeful this will help with her back and RLS. Reviewed provider established plan for pain management. 11-16-2020: the patient states that she does take Tylenol for pain relief and discomfort at times. She stays very busy and she states she feels somewhat  overwhelmed. She rates her pain today at a 4 on  a scale of 0-10. 01-25-2021: Is effectively managing pain at this time. Denies any acute findings. Will continue to monitor. 03-08-2021: The patient has been dealing with cellulitis of left lower leg and was seen in ER on 02-26-2021, Urgent care on 03-01-2021 and pcp today. Per the patient the area is looing better and resolving. Will work with pcp for continued recommendations and support. 04-05-2021: The patient is going to the spine specialist this week to be evaluated for back pain on Wednesday. Called the Vein and Vascular specialist with the patient and secured the patient an appointment for March 6 at 0730 am with the specialist. The patient has not been there since 2021. She did see the neurologist about her RLS earlier in the month. She is thankful for the support of the CCM team. 06-07-2021: Is seeing several specialist for help with her chronic conditions. 08-09-2021: The patient states that she is going in August for MRI's to see if they can pinpoint the specific problem. She saw the neurologist today and has had changes in her medications. She is hopeful this will help with her pain levels as sometimes it gets really bad.  Discussed importance of adherence to all scheduled medical appointments. 04-07-2021 with spine specialist. The patient has an appointment with the Vascular provider on 05-03-2021 at 730 am. Will call pcp for new changes or needs.  Knows to call for changes or needs. 06-07-2021: Eye exam last week, urgent care recently for sore throat and fever with headache, sees specialist on 06-08-2021 for RLS, dentist on 4-12 and 4-13, cardiologist on 4-14, orthopedic provider on 4-19 and vascular surgeon on 06-16-2021. Knows to call pcp for changes or new needs and concerns. 08-09-2021: Saw neurologist today and has upcoming appointments in July with the pcp and MRI scheduled for 09-30-2021. Counseled on the importance of reporting any/all new or changed pain  symptoms or management strategies to pain management provider; Advised patient to report to care team affect of pain on daily activities; Discussed use of relaxation techniques and/or diversional activities to assist with pain reduction (distraction, imagery, relaxation, massage, acupressure, TENS, heat, and cold application; Reviewed with patient prescribed pharmacological and nonpharmacological pain relief strategies; Advised patient to discuss call the provider for changes in level or intensity of pain and discomfort with provider;   Left lower leg cellulitis   (Status: Goal Met.) Short Term Goal  08-09-2021: Closing this goal. Goal has been met. Evaluation of current treatment plan related to  Left lower leg cellulitis ,  self-management and patient's adherence to plan as established by provider. 04-05-2021: The patient will see the vascular provider on May 03, 2021 at 57 for evaluation of bilateral statis dermatitis and venous insufficiency. The patient states the cellulitis is better in her left leg but still having issues with this and states she had called several times and left messages for them to call her back and schedule an appointment. RNCM called with the patient today to schedule an appointment with the vein and vascular. Appointment secured for 05-03-2021 at 0730 am. 06-07-2021: The patient states she is stable. She follows up with vascular provider on 06-17-2021 and Dr. Jorja Loa tomorrow for RLS. She is compliant with the plan of care.  Discussed plans with patient for ongoing care management follow up and provided patient with direct contact information for care management team Advised patient to call the office for changes in cellulitis to left lower leg, drainage, sx and sx of infection, questions, or concerns; Provided education to  patient re: monitoring for changes to site, monitoring for sx and sx of infection, hygiene and care to the infected area,; Reviewed medications with patient and  discussed compliance and to take all antibiotics until course is done. ; Reviewed scheduled/upcoming provider appointments including saw pcp today in the office. 06-07-2021: Upcoming appointment 06-17-2021 for evaluation at vein and vascular specialist; Discussed plans with patient for ongoing care management follow up and provided patient with direct contact information for care management team; Advised patient to discuss changes in the are of the cellulitis, questions, or concerns with provider;   Patient Goals/Self-Care Activities: Patient will self administer medications as prescribed Patient will attend all scheduled provider appointments Patient will call pharmacy for medication refills Patient will attend church or other social activities Patient will continue to perform ADL's independently Patient will continue to perform IADL's independently Patient will call provider office for new concerns or questions Patient will work with BSW to address care coordination needs and will continue to work with the clinical team to address health care and disease management related needs.         Plan:Telephone follow up appointment with care management team member scheduled for:  10-11-2021 at 1 pm  Noreene Larsson RN, MSN, [place] Mobile: [phone]

## 2021-08-14 ENCOUNTER — Other Ambulatory Visit: Payer: Self-pay | Admitting: Family Medicine

## 2021-08-14 DIAGNOSIS — G4709 Other insomnia: Secondary | ICD-10-CM

## 2021-08-14 DIAGNOSIS — G2581 Restless legs syndrome: Secondary | ICD-10-CM

## 2021-08-14 DIAGNOSIS — F331 Major depressive disorder, recurrent, moderate: Secondary | ICD-10-CM

## 2021-08-16 NOTE — Telephone Encounter (Signed)
Requested Prescriptions  Pending Prescriptions Disp Refills  . DULoxetine (CYMBALTA) 30 MG capsule [Pharmacy Med Name: DULOXETINE DR 30MG CAPSULES] 90 capsule 0    Sig: TAKE 1 CAPSULE(30 MG) BY MOUTH DAILY     Psychiatry: Antidepressants - SNRI - duloxetine Passed - 08/14/2021  7:10 AM      Passed - Cr in normal range and within 360 days    Creat  Date Value Ref Range Status  09/09/2020 0.71 0.50 - 1.05 mg/dL Final         Passed - eGFR is 30 or above and within 360 days    GFR, Est African American  Date Value Ref Range Status  09/12/2019 107 > OR = 60 mL/min/1.2m Final   GFR, Est Non African American  Date Value Ref Range Status  09/12/2019 93 > OR = 60 mL/min/1.748mFinal   eGFR  Date Value Ref Range Status  09/09/2020 93 > OR = 60 mL/min/1.7349minal    Comment:    The eGFR is based on the CKD-EPI 2021 equation. To calculate  the new eGFR from a previous Creatinine or Cystatin C result, go to https://www.kidney.org/professionals/ kdoqi/gfr%5Fcalculator          Passed - Completed PHQ-2 or PHQ-9 in the last 360 days      Passed - Last BP in normal range    BP Readings from Last 1 Encounters:  08/09/21 137/85         Passed - Valid encounter within last 6 months    Recent Outpatient Visits          5 months ago Venous stasis dermatitis of left lower extremity   [facility]   11 months ago Annual physical exam   [place]   1 year ago Restless leg syndrome   [facility]   1 year ago Type 2 diabetes mellitus with other specified complication, without long-term current use of insulin [facility]   1 year ago Type 2 diabetes mellitus with other specified complication, without long-term current use of insulin ([facility]       Future Appointments            In 3 weeks [facility]

## 2021-08-23 ENCOUNTER — Ambulatory Visit (INDEPENDENT_AMBULATORY_CARE_PROVIDER_SITE_OTHER): Payer: Medicare Other | Admitting: Vascular Surgery

## 2021-08-27 DIAGNOSIS — E1159 Type 2 diabetes mellitus with other circulatory complications: Secondary | ICD-10-CM | POA: Diagnosis not present

## 2021-08-27 DIAGNOSIS — F32A Depression, unspecified: Secondary | ICD-10-CM | POA: Diagnosis not present

## 2021-08-27 DIAGNOSIS — E785 Hyperlipidemia, unspecified: Secondary | ICD-10-CM | POA: Diagnosis not present

## 2021-08-27 DIAGNOSIS — I10 Essential (primary) hypertension: Secondary | ICD-10-CM | POA: Diagnosis not present

## 2021-09-07 ENCOUNTER — Other Ambulatory Visit: Payer: Medicare Other

## 2021-09-07 DIAGNOSIS — E785 Hyperlipidemia, unspecified: Secondary | ICD-10-CM | POA: Diagnosis not present

## 2021-09-07 DIAGNOSIS — E1169 Type 2 diabetes mellitus with other specified complication: Secondary | ICD-10-CM

## 2021-09-07 DIAGNOSIS — I10 Essential (primary) hypertension: Secondary | ICD-10-CM | POA: Diagnosis not present

## 2021-09-07 DIAGNOSIS — Z0000 Encounter for general adult medical examination without abnormal findings: Secondary | ICD-10-CM

## 2021-09-08 LAB — COMPLETE METABOLIC PANEL WITH GFR
AG Ratio: 1.6 (calc) (ref 1.0–2.5)
ALT: 19 U/L (ref 6–29)
AST: 21 U/L (ref 10–35)
Albumin: 3.9 g/dL (ref 3.6–5.1)
Alkaline phosphatase (APISO): 87 U/L (ref 37–153)
BUN: 15 mg/dL (ref 7–25)
CO2: 27 mmol/L (ref 20–32)
Calcium: 8.6 mg/dL (ref 8.6–10.4)
Chloride: 108 mmol/L (ref 98–110)
Creat: 0.67 mg/dL (ref 0.50–1.05)
Globulin: 2.5 g/dL (calc) (ref 1.9–3.7)
Glucose, Bld: 78 mg/dL (ref 65–99)
Potassium: 3.8 mmol/L (ref 3.5–5.3)
Sodium: 143 mmol/L (ref 135–146)
Total Bilirubin: 0.9 mg/dL (ref 0.2–1.2)
Total Protein: 6.4 g/dL (ref 6.1–8.1)
eGFR: 95 mL/min/{1.73_m2} (ref >=60)

## 2021-09-08 LAB — CBC WITH DIFFERENTIAL/PLATELET
Absolute Monocytes: 872 cells/uL (ref 200–950)
Basophils Absolute: 40 cells/uL (ref 0–200)
Basophils Relative: 0.5 %
Eosinophils Absolute: 424 cells/uL (ref 15–500)
Eosinophils Relative: 5.3 %
HCT: 37 % (ref 35.0–45.0)
Hemoglobin: 11.8 g/dL (ref 11.7–15.5)
Lymphs Abs: 2112 cells/uL (ref 850–3900)
MCH: 29.8 pg (ref 27.0–33.0)
MCHC: 31.9 g/dL — ABNORMAL LOW (ref 32.0–36.0)
MCV: 93.4 fL (ref 80.0–100.0)
MPV: 11.8 fL (ref 7.5–12.5)
Monocytes Relative: 10.9 %
Neutro Abs: 4552 cells/uL (ref 1500–7800)
Neutrophils Relative %: 56.9 %
Platelets: 236 10*3/uL (ref 140–400)
RBC: 3.96 10*6/uL (ref 3.80–5.10)
RDW: 13.7 % (ref 11.0–15.0)
Total Lymphocyte: 26.4 %
WBC: 8 10*3/uL (ref 3.8–10.8)

## 2021-09-08 LAB — HEMOGLOBIN A1C
Hgb A1c MFr Bld: 5.9 % of total Hgb — ABNORMAL HIGH (ref <5.7)
Mean Plasma Glucose: 123 mg/dL
eAG (mmol/L): 6.8 mmol/L

## 2021-09-08 LAB — LIPID PANEL
Cholesterol: 125 mg/dL (ref <200)
HDL: 54 mg/dL (ref >=50)
LDL Cholesterol (Calc): 58 mg/dL (calc)
Non-HDL Cholesterol (Calc): 71 mg/dL (calc) (ref <130)
Total CHOL/HDL Ratio: 2.3 (calc) (ref <5.0)
Triglycerides: 58 mg/dL (ref <150)

## 2021-09-08 LAB — TSH: TSH: 2.53 mIU/L (ref 0.40–4.50)

## 2021-09-10 ENCOUNTER — Encounter: Payer: Self-pay | Admitting: Family Medicine

## 2021-09-10 ENCOUNTER — Ambulatory Visit (INDEPENDENT_AMBULATORY_CARE_PROVIDER_SITE_OTHER): Payer: Medicare Other | Admitting: Family Medicine

## 2021-09-10 ENCOUNTER — Other Ambulatory Visit: Payer: Self-pay | Admitting: Family Medicine

## 2021-09-10 VITALS — BP 136/80 | HR 82 | Ht 62.0 in | Wt 228.0 lb

## 2021-09-10 DIAGNOSIS — E1169 Type 2 diabetes mellitus with other specified complication: Secondary | ICD-10-CM

## 2021-09-10 DIAGNOSIS — Z0000 Encounter for general adult medical examination without abnormal findings: Secondary | ICD-10-CM

## 2021-09-10 DIAGNOSIS — F3342 Major depressive disorder, recurrent, in full remission: Secondary | ICD-10-CM | POA: Diagnosis not present

## 2021-09-10 DIAGNOSIS — Z1231 Encounter for screening mammogram for malignant neoplasm of breast: Secondary | ICD-10-CM | POA: Diagnosis not present

## 2021-09-10 DIAGNOSIS — E785 Hyperlipidemia, unspecified: Secondary | ICD-10-CM | POA: Diagnosis not present

## 2021-09-10 DIAGNOSIS — I10 Essential (primary) hypertension: Secondary | ICD-10-CM

## 2021-09-10 NOTE — Assessment & Plan Note (Signed)
Well-controlled DM with A1c 5.9 S/p bariatric surgery wt loss Complications - peripheral neuropathy, hyperlipidemia, depression, obesity, OSA  increases risk of future cardiovascular complications   Plan:  1. Remain off of therapy, diet / lifestyle controlled 2. Encourage improved lifestyle - low carb, low sugar diet, reduce portion size, continue improving regular exercise 3. Check CBG , bring log to next visit for review 4. Continue ARB, Statin Future DM Eye exam, recommended, she can schedule. DM Foot exam today

## 2021-09-10 NOTE — Progress Notes (Signed)
Subjective:    Patient ID: Janet [name], female    DOB: 1950/06/30, 71 y.o.   MRN: [id number]  Simrin Vegh is a 71 y.o. female presenting on 09/10/2021 for Annual Exam   HPI  Here for Annual Physical and Lab Review.   CHRONIC DM, Type 2: Reports no concerns since bariatric surgery wt loss, major improvement A1c last resulted 5.9, off of medication Meds: None (no longer on Metformin) Reports good compliance. Tolerating well w/o side-effects Currently on ARB Lifestyle: - Diet (improved)  She had Eye Exam glasses at [facility], will schedule DM eye Denies hypoglycemia, polyuria, visual changes, numbness or tingling.   HYPERLIPIDEMIA: - Reports no concerns. Last lipid panel 08/2021, controlled on statin - Currently taking Atorvastatin 61m, tolerating well without side effects or myalgias   Chronic Low Back Pain Chronic Osteoarthritis, Lumbar DDD / Sciatica Left Leg Pain and Numbness / Possible peripheral neuropathy Chronic L knee pain, history of osteoarthritis, s/p prior L knee TKR surgery and repeat procedure - [facility] knee replacement, they did repeat knee procedure manipulation 01/2019 - Did PT, still has pain and swelling of left lower extremity  Completed PT and still continuing, with limited relief. History of L thigh pain numbness Some improved on Duloxetine. Takes Baclofen PRN muscle spasm.   Varicose veins Venous Insufficiency   History of Bariatric Surgery - Gastric Bypass - She lost 95 lbs in 1.5 years - She has history of type 2 diabetes. Her A1c has been controlled after surgery    Restless Leg Syndrome / Insomnia Major Depression recurrent in remission   Chronic problems On mirapex 14mBID, failed ropinirole, gabapentin - Previously dx with depression in past, was followed by Psychiatry and was on some medication cannot recall names of meds, >3-4 years ago, she stopped  following with them. - Today she states mood is improved - Last visit was started on Duloxetine 3060maily. She says is helping mood, and also seems to help some neuropathy pain symptoms. But not resolve.   CHRONIC HTN / s/p Pacemaker Reports home BP readings 120s/60-70s. Checks it at home occasionally [facility]. Current Meds - Losartan-HCTZ 100-37m75mily, Metoprolol tartrate 37mg32mhtly Reports good compliance, took meds today. Tolerating well, w/o complaints. Denies CP, dyspnea, HA, edema, dizziness / lightheadedness       Health Maintenance:   UTD Shingles vaccines  Mammogram due, last 2020. Will order.     09/10/2021   11:32 PM 03/08/2021   10:56 AM 10/20/2020    9:08 AM  Depression screen PHQ 2/9  Decreased Interest _0 Down, Depressed, Hopeless 0 1 1  PHQ - 2 Score _1 Altered sleeping 0 3 3  Tired, decreased energy 1 0 3  Change in appetite 0 1 0  Feeling bad or failure about yourself  0 0 0  Trouble concentrating 0 1 0  Moving slowly or fidgety/restless 0 0 0  Suicidal thoughts 0 0 0  PHQ-9 Score _2 Difficult doing work/chores Not difficult at all Not difficult at all Somewhat difficult      03/08/2021   10:56 AM 09/16/2020    9:38 AM 08/20/2019   10:50 AM  GAD 7 : Generalized Anxiety Score  Nervous, Anxious, on Edge 0 1 1  Control/stop worrying 0 0 0  Worry too much - different things 0  0 0  Trouble relaxing _0 Restless 0 1 2  Easily annoyed or irritable 0 0 1  Afraid - awful might happen 0 0 0  Total GAD 7 Score _1 Anxiety Difficulty Not difficult at all Not difficult at all Somewhat difficult      Past Medical History:  Diagnosis Date   GERD (gastroesophageal reflux disease)    Heart murmur    Hyperlipidemia    Hypertension    PAD (peripheral artery disease) (HCC)    Restless leg    Sleep apnea    Urinary incontinence    Past Surgical History:  Procedure Laterality Date   APPENDECTOMY   2014   CARPAL TUNNEL RELEASE Left 1998   DG C-ARM 2 VIEW LEFT SHOULDER ([place] [facility])     DG C-ARM 2 VIEW RIGHT SHOULDER ([place] [facility])     ESOPHAGEAL DILATION  2012   2015, 2017, 2020   GASTRIC BYPASS  2019   HERNIA REPAIR  2019   left knee manipulation Left 02/05/2019   PACEMAKER IMPLANT  2017   POLYPECTOMY     SHOULDER SURGERY Left 2014   SHOULDER SURGERY Right 2017   TOTAL KNEE ARTHROPLASTY Right 2018   TOTAL KNEE ARTHROPLASTY Left 10/18/2018   vein removal Bilateral 2012   Social History   Socioeconomic History   Marital status: Married    Spouse name: Not on file   Number of children: Not on file   Years of education: Not on file   Highest education level: Not on file  Occupational History   Not on file  Tobacco Use   Smoking status: Former    Packs/day: 1.00    Years: 25.00    Total pack years: 25.00    Types: Cigarettes   Smokeless tobacco: Former  Scientific laboratory technician Use: Never used  Substance and Sexual Activity   Alcohol use: Never   Drug use: Never   Sexual activity: Not Currently  Other Topics Concern   Not on file  Social History Narrative   Not on file   Social Determinants of Health   Financial Resource Strain: Low Risk  (11/16/2020)   Overall Financial Resource Strain (CARDIA)    Difficulty of Paying Living Expenses: Not hard at all  Food Insecurity: No Food Insecurity (11/16/2020)   Hunger Vital Sign    Worried About Running Out of Food in the Last Year: Never true    Ran Out of Food in the Last Year: Never true  Transportation Needs: No Transportation Needs (11/16/2020)   PRAPARE - Hydrologist (Medical): No    Lack of Transportation (Non-Medical): No  Physical Activity: Inactive (11/16/2020)   Exercise Vital Sign    Days of Exercise per Week: 0 days    Minutes of Exercise per Session: 0 min  Stress: No Stress Concern Present (11/16/2020)   [place]     Feeling of Stress : Not at all  Social Connections: Moderately Isolated (11/16/2020)   Social Connection and Isolation Panel [NHANES]    Frequency of Communication with Friends and Family: More than three times a week    Frequency of Social Gatherings with Friends and Family: More than three times a week    Attends Religious Services: Never    Marine scientist or Organizations: No    Attends Archivist Meetings: Never    Marital Status: Married  Intimate Partner Violence: Not At Risk (08/09/2021)   Humiliation, Afraid, Rape, and Kick questionnaire    Fear of Current or Ex-Partner: No    Emotionally Abused: No    Physically Abused: No    Sexually Abused: No   Family History  Problem Relation Age of Onset   Heart attack Mother 48   Heart disease Father    Thyroid disease Father    Skin cancer Father    Brain cancer Maternal Grandmother    Current Outpatient Medications on File Prior to Visit  Medication Sig   aspirin 81 MG EC tablet Take 81 mg by mouth daily.   atorvastatin (LIPITOR) 40 MG tablet Take 40 mg by mouth at bedtime.   baclofen (LIORESAL) 10 MG tablet TAKE 1 TABLET(10 MG) BY MOUTH AT BEDTIME AS NEEDED FOR MUSCLE SPASMS   DULoxetine (CYMBALTA) 30 MG capsule TAKE 1 CAPSULE(30 MG) BY MOUTH DAILY   gabapentin (NEURONTIN) 300 MG capsule Take 300 mg by mouth 3 (three) times daily.   losartan-hydrochlorothiazide (HYZAAR) 100-25 MG tablet TAKE 1 TABLET BY MOUTH DAILY   pramipexole (MIRAPEX) 1 MG tablet Take 1 tablet (1 mg total) by mouth 2 (two) times daily.   meloxicam (MOBIC) 15 MG tablet Take 15 mg by mouth daily as needed. (Patient not taking: Reported on 09/10/2021)   No current facility-administered medications on file prior to visit.    Review of Systems  Constitutional:  Negative for activity change, appetite change, chills, diaphoresis, fatigue and fever.  HENT:  Negative for congestion and hearing loss.   Eyes:  Negative for visual disturbance.   Respiratory:  Negative for cough, chest tightness, shortness of breath and wheezing.   Cardiovascular:  Positive for leg swelling. Negative for chest pain and palpitations.  Gastrointestinal:  Negative for abdominal pain, constipation, diarrhea, nausea and vomiting.  Genitourinary:  Negative for dysuria, frequency and hematuria.  Musculoskeletal:  Positive for arthralgias and back pain. Negative for neck pain.  Skin:  Negative for rash.  Neurological:  Negative for dizziness, weakness, light-headedness, numbness and headaches.  Hematological:  Negative for adenopathy.  Psychiatric/Behavioral:  Negative for behavioral problems, dysphoric mood and sleep disturbance.    Per HPI unless specifically indicated above      Objective:    BP 136/80 (BP Location: Left Arm, Cuff Size: Normal)   Pulse 82   Ht 5' 2" (1.575 m)   Wt 228 lb (103.4 kg)   SpO2 100%   BMI 41.70 kg/m   Wt Readings from Last 3 Encounters:  09/10/21 228 lb (103.4 kg)  08/09/21 221 lb (100.2 kg)  06/17/21 214 lb 12.8 oz (97.4 kg)    Physical Exam Vitals and nursing note reviewed.  Constitutional:      General: She is not in acute distress.    Appearance: She is well-developed. She is obese. She is not diaphoretic.     Comments: Well-appearing, comfortable, cooperative  HENT:     Head: Normocephalic and atraumatic.  Eyes:     General:        Right eye: No discharge.        Left eye: No discharge.     Conjunctiva/sclera: Conjunctivae normal.     Pupils: Pupils are equal, round, and reactive to light.  Neck:     Thyroid: No thyromegaly.  Cardiovascular:     Rate and Rhythm: Normal rate and regular rhythm.     Pulses: Normal pulses.     Heart sounds: Normal heart sounds. No murmur heard.  Pulmonary:     Effort: Pulmonary effort is normal. No respiratory distress.     Breath sounds: Normal breath sounds. No wheezing or rales.  Abdominal:     General: Bowel sounds are normal. There is no distension.      Palpations: Abdomen is soft. There is no mass.     Tenderness: There is no abdominal tenderness.  Musculoskeletal:        General: No tenderness. Normal range of motion.     Cervical back: Normal range of motion and neck supple.     Right lower leg: Edema (+1 non pitting edema bilateral, varicose veins) present.     Left lower leg: Edema present.     Comments: Upper / Lower Extremities: - Normal muscle tone, strength bilateral upper extremities 5/5, lower extremities 5/5  Lymphadenopathy:     Cervical: No cervical adenopathy.  Skin:    General: Skin is warm and dry.     Findings: No erythema or rash.  Neurological:     Mental Status: She is alert and oriented to person, place, and time.     Comments: Distal sensation intact to light touch all extremities  Psychiatric:        Mood and Affect: Mood normal.        Behavior: Behavior normal.        Thought Content: Thought content normal.     Comments: Well groomed, good eye contact, normal speech and thoughts     Diabetic Foot Exam - Simple   Simple Foot Form Diabetic Foot exam was performed with the following findings: Yes 09/10/2021  1:28 PM  Visual Inspection See comments: Yes Sensation Testing See comments: Yes Pulse Check Posterior Tibialis and Dorsalis pulse intact bilaterally: Yes Comments Bilateral callus formation right great toe forefoot and bilateral heels. Reduced monofilament R great toe and bilateral heels. Varicose veins    Recent Labs    09/07/21 0816  HGBA1C 5.9*     Results for orders placed or performed in visit on 09/07/21  TSH  Result Value Ref Range   TSH 2.53 0.40 - 4.50 mIU/L  Hemoglobin A1c  Result Value Ref Range   Hgb A1c MFr Bld 5.9 (H) <5.7 % of total Hgb   Mean Plasma Glucose 123 mg/dL   eAG (mmol/L) 6.8 mmol/L  CBC with Differential/Platelet  Result Value Ref Range   WBC 8.0 3.8 - 10.8 Thousand/uL   RBC 3.96 3.80 - 5.10 Million/uL   Hemoglobin 11.8 11.7 - 15.5 g/dL   HCT 37.0 35.0  - 45.0 %   MCV 93.4 80.0 - 100.0 fL   MCH 29.8 27.0 - 33.0 pg   MCHC 31.9 (L) 32.0 - 36.0 g/dL   RDW 13.7 11.0 - 15.0 %   Platelets 236 140 - 400 Thousand/uL   MPV 11.8 7.5 - 12.5 fL   Neutro Abs 4,552 1,500 - 7,800 cells/uL   Lymphs Abs 2,112 850 - 3,900 cells/uL   Absolute Monocytes 872 200 - 950 cells/uL   Eosinophils Absolute 424 15 - 500 cells/uL   Basophils Absolute 40 0 - 200 cells/uL   Neutrophils Relative % 56.9 %   Total Lymphocyte 26.4 %   Monocytes Relative 10.9 %   Eosinophils Relative 5.3 %   Basophils Relative 0.5 %  Lipid panel  Result Value Ref Range   Cholesterol 125 <200 mg/dL   HDL 54 > OR = 50 mg/dL   Triglycerides 58 <150 mg/dL   LDL Cholesterol (Calc) 58 mg/dL (calc)  Total CHOL/HDL Ratio 2.3 <5.0 (calc)   Non-HDL Cholesterol (Calc) 71 <130 mg/dL (calc)  COMPLETE METABOLIC PANEL WITH GFR  Result Value Ref Range   Glucose, Bld 78 65 - 99 mg/dL   BUN 15 7 - 25 mg/dL   Creat 0.67 0.50 - 1.05 mg/dL   eGFR 95 > OR = 60 mL/min/1.49m   BUN/Creatinine Ratio NOT APPLICABLE 6 - 22 (calc)   Sodium 143 135 - 146 mmol/L   Potassium 3.8 3.5 - 5.3 mmol/L   Chloride 108 98 - 110 mmol/L   CO2 27 20 - 32 mmol/L   Calcium 8.6 8.6 - 10.4 mg/dL   Total Protein 6.4 6.1 - 8.1 g/dL   Albumin 3.9 3.6 - 5.1 g/dL   Globulin 2.5 1.9 - 3.7 g/dL (calc)   AG Ratio 1.6 1.0 - 2.5 (calc)   Total Bilirubin 0.9 0.2 - 1.2 mg/dL   Alkaline phosphatase (APISO) 87 37 - 153 U/L   AST 21 10 - 35 U/L   ALT 19 6 - 29 U/L      Assessment & Plan:   Problem List Items Addressed This Visit     Type 2 diabetes mellitus with other specified complication (HCC)    Well-controlled DM with A1c 5.9 S/p bariatric surgery wt loss Complications - peripheral neuropathy, hyperlipidemia, depression, obesity, OSA  increases risk of future cardiovascular complications   Plan:  1. Remain off of therapy, diet / lifestyle controlled 2. Encourage improved lifestyle - low carb, low sugar diet, reduce  portion size, continue improving regular exercise 3. Check CBG , bring log to next visit for review 4. Continue ARB, Statin Future DM Eye exam, recommended, she can schedule. DM Foot exam today      Relevant Orders   Urine Microalbumin w/creat. ratio   Morbid obesity (HCC)   Major depression, recurrent, full remission ([place]    Chronic problem Recurrent now in remission See PHQ Improved on SNRI Duloxetine 323m consider dose adjust for mood/sleep or pain      Hyperlipidemia associated with type 2 diabetes mellitus ([place]   Controlled cholesterol on statin lifestyle Last lipid panel 08/2021  Plan: 1. Continue current meds - Atorvastatin 4072maily 2. Continue ASA 5m89mr primary ASCVD risk reduction 3. Encourage improved lifestyle - low carb/cholesterol, reduce portion size, continue improving regular exercise       Other Visit Diagnoses     Annual physical exam    -  Primary   Encounter for screening mammogram for malignant neoplasm of breast       Relevant Orders   MM 3D SCREEN BREAST BILATERAL       Updated Health Maintenance information Reviewed recent lab results with patient Encouraged improvement to lifestyle with diet and exercise Goal of weight loss  Ordered mammogram  Recommend DM Eye Exam  No orders of the defined types were placed in this encounter.     Follow up plan: Return in about 1 year (around 09/11/2022) for 1 year fasting lab only then 1 week later Annual Physical.  Future labs 08/2022  AlexNobie Putnam SP[place] 09/10/2021, 1:23 PM

## 2021-09-10 NOTE — Assessment & Plan Note (Signed)
Chronic problem Recurrent now in remission See PHQ Improved on SNRI Duloxetine 30mg , consider dose adjust for mood/sleep or pain

## 2021-09-10 NOTE — Patient Instructions (Addendum)
Thank you for coming to the office today.  For Mammogram screening for breast cancer   Call the Imaging Center below anytime to schedule your own appointment now that order has been placed.  [facility] [address] [place], [place] [address] Phone: [phone]  Recommend Diabetic Eye Exam before the year runs out.  Labs look excellent. Normal cholesterol, controlled on med  Recent Labs    09/07/21 0816  HGBA1C 5.9*   Excellent sugar, off of the metformin.  BP improved on re-check  DUE for FASTING BLOOD WORK (no food or drink after midnight before the lab appointment, only water or coffee without cream/sugar on the morning of)  SCHEDULE "Lab Only" visit in the morning at the clinic for lab draw in 1 YEAR  - Make sure Lab Only appointment is at about 1 week before your next appointment, so that results will be available  For Lab Results, once available within 2-3 days of blood draw, you can can log in to MyChart online to view your results and a brief explanation. Also, we can discuss results at next follow-up visit.   Please schedule a Follow-up Appointment to: Return in about 1 year (around 09/11/2022) for 1 year fasting lab only then 1 week later Annual Physical.  If you have any other questions or concerns, please feel free to call the office or send a message through MyChart. You may also schedule an earlier appointment if necessary.  Additionally, you may be receiving a survey about your experience at our office within a few days to 1 week by e-mail or mail. We value your feedback.  Saralyn Pilar, DO [facility], [place]

## 2021-09-10 NOTE — Assessment & Plan Note (Signed)
Controlled cholesterol on statin lifestyle Last lipid panel 08/2021  Plan: 1. Continue current meds - Atorvastatin 40mg  daily 2. Continue ASA 81mg  for primary ASCVD risk reduction 3. Encourage improved lifestyle - low carb/cholesterol, reduce portion size, continue improving regular exercise

## 2021-09-11 LAB — MICROALBUMIN / CREATININE URINE RATIO
Creatinine, Urine: 32 mg/dL (ref 20–275)
Microalb Creat Ratio: 6 mcg/mg creat (ref <30)
Microalb, Ur: 0.2 mg/dL

## 2021-09-22 DIAGNOSIS — M48061 Spinal stenosis, lumbar region without neurogenic claudication: Secondary | ICD-10-CM | POA: Diagnosis not present

## 2021-09-22 DIAGNOSIS — R200 Anesthesia of skin: Secondary | ICD-10-CM | POA: Diagnosis not present

## 2021-09-22 DIAGNOSIS — M79605 Pain in left leg: Secondary | ICD-10-CM | POA: Diagnosis not present

## 2021-09-22 DIAGNOSIS — M79601 Pain in right arm: Secondary | ICD-10-CM | POA: Diagnosis not present

## 2021-09-22 DIAGNOSIS — G629 Polyneuropathy, unspecified: Secondary | ICD-10-CM | POA: Diagnosis not present

## 2021-09-22 DIAGNOSIS — G2581 Restless legs syndrome: Secondary | ICD-10-CM | POA: Diagnosis not present

## 2021-09-22 DIAGNOSIS — E1169 Type 2 diabetes mellitus with other specified complication: Secondary | ICD-10-CM | POA: Diagnosis not present

## 2021-09-22 DIAGNOSIS — M79641 Pain in right hand: Secondary | ICD-10-CM | POA: Diagnosis not present

## 2021-09-22 DIAGNOSIS — R202 Paresthesia of skin: Secondary | ICD-10-CM | POA: Diagnosis not present

## 2021-09-22 DIAGNOSIS — M79604 Pain in right leg: Secondary | ICD-10-CM | POA: Diagnosis not present

## 2021-09-22 DIAGNOSIS — R29898 Other symptoms and signs involving the musculoskeletal system: Secondary | ICD-10-CM | POA: Diagnosis not present

## 2021-09-30 ENCOUNTER — Ambulatory Visit (HOSPITAL_COMMUNITY)
Admission: RE | Admit: 2021-09-30 | Discharge: 2021-09-30 | Disposition: A | Discharge status: Home / Self Care | Payer: Medicare Other | Source: Ambulatory Visit | Attending: Family Medicine | Admitting: Family Medicine

## 2021-09-30 DIAGNOSIS — M5030 Other cervical disc degeneration, unspecified cervical region: Secondary | ICD-10-CM

## 2021-09-30 DIAGNOSIS — M4807 Spinal stenosis, lumbosacral region: Secondary | ICD-10-CM | POA: Insufficient documentation

## 2021-09-30 DIAGNOSIS — M5126 Other intervertebral disc displacement, lumbar region: Secondary | ICD-10-CM | POA: Diagnosis not present

## 2021-09-30 DIAGNOSIS — M5412 Radiculopathy, cervical region: Secondary | ICD-10-CM | POA: Diagnosis not present

## 2021-09-30 DIAGNOSIS — M48061 Spinal stenosis, lumbar region without neurogenic claudication: Secondary | ICD-10-CM | POA: Diagnosis not present

## 2021-09-30 DIAGNOSIS — M542 Cervicalgia: Secondary | ICD-10-CM | POA: Diagnosis not present

## 2021-09-30 DIAGNOSIS — M5416 Radiculopathy, lumbar region: Secondary | ICD-10-CM

## 2021-09-30 DIAGNOSIS — M47817 Spondylosis without myelopathy or radiculopathy, lumbosacral region: Secondary | ICD-10-CM | POA: Diagnosis not present

## 2021-09-30 NOTE — Progress Notes (Signed)
Patient here today at [facility] for MRI cervical/lumbar spine wo contrast. Patient has [employer] device. Transmission sent. Orders for OVO. Will re-program once scan is complete.

## 2021-10-05 ENCOUNTER — Ambulatory Visit: Payer: Medicare Other | Admitting: Licensed Clinical Social Worker

## 2021-10-11 ENCOUNTER — Ambulatory Visit: Payer: Self-pay

## 2021-10-11 ENCOUNTER — Telehealth: Payer: Medicare Other

## 2021-10-11 DIAGNOSIS — M1712 Unilateral primary osteoarthritis, left knee: Secondary | ICD-10-CM

## 2021-10-11 DIAGNOSIS — F411 Generalized anxiety disorder: Secondary | ICD-10-CM

## 2021-10-11 DIAGNOSIS — G8929 Other chronic pain: Secondary | ICD-10-CM

## 2021-10-11 DIAGNOSIS — E782 Mixed hyperlipidemia: Secondary | ICD-10-CM

## 2021-10-11 DIAGNOSIS — E1169 Type 2 diabetes mellitus with other specified complication: Secondary | ICD-10-CM

## 2021-10-11 DIAGNOSIS — F3342 Major depressive disorder, recurrent, in full remission: Secondary | ICD-10-CM

## 2021-10-11 DIAGNOSIS — M159 Polyosteoarthritis, unspecified: Secondary | ICD-10-CM

## 2021-10-11 NOTE — Chronic Care Management (AMB) (Signed)
Chronic Care Management   CCM RN Visit Note  10/11/2021 Name: Loanne Emery MRN: [id number] DOB: 18-Jul-1950  Subjective: Yuval Nolet is a 71 y.o. year old female who is a primary care patient of Olin Hauser, DO. The care management team was consulted for assistance with disease management and care coordination needs.    Engaged with patient by telephone for follow up visit in response to provider referral for case management and/or care coordination services.   Consent to Services:  The patient was given information about Chronic Care Management services, agreed to services, and gave verbal consent prior to initiation of services.  Please see initial visit note for detailed documentation.   Patient agreed to services and verbal consent obtained.   Assessment: Review of patient past medical history, allergies, medications, health status, including review of consultants reports, laboratory and other test data, was performed as part of comprehensive evaluation and provision of chronic care management services.   SDOH (Social Determinants of Health) assessments and interventions performed:    CCM Care Plan  No Known Allergies  Outpatient Encounter Medications as of 10/11/2021  Medication Sig Note   aspirin 81 MG EC tablet Take 81 mg by mouth daily.    atorvastatin (LIPITOR) 40 MG tablet Take 40 mg by mouth at bedtime.    baclofen (LIORESAL) 10 MG tablet TAKE 1 TABLET(10 MG) BY MOUTH AT BEDTIME AS NEEDED FOR MUSCLE SPASMS    DULoxetine (CYMBALTA) 30 MG capsule TAKE 1 CAPSULE(30 MG) BY MOUTH DAILY    gabapentin (NEURONTIN) 300 MG capsule Take 300 mg by mouth 3 (three) times daily. 12/04/2020: Taking 1 capsule (300 mg) in the morning and 2 capsules  (600 mg) at night as directed by Neurologist.   losartan-hydrochlorothiazide (HYZAAR) 100-25 MG tablet TAKE 1 TABLET BY MOUTH DAILY    meloxicam (MOBIC) 15 MG tablet Take 15 mg by mouth daily as needed. (Patient not taking: Reported on  09/10/2021)    pramipexole (MIRAPEX) 1 MG tablet Take 1 tablet (1 mg total) by mouth 2 (two) times daily.    No facility-administered encounter medications on file as of 10/11/2021.    Patient Active Problem List   Diagnosis Date Noted   Atherosclerosis of left carotid artery 07/20/2021   Chronic left-sided low back pain with left-sided sciatica 04/14/2020   History of deep vein thrombosis 09/16/2019   Type 2 diabetes mellitus with other specified complication ([place]) 13/24/4010   Restless leg syndrome 08/20/2019   Major depression, recurrent, full remission ([place]) 08/20/2019   Other insomnia 08/20/2019   Symptomatic varicose veins, bilateral 08/20/2019   Edema of left lower extremity 08/20/2019   Primary osteoarthritis involving multiple joints 08/20/2019   Venous insufficiency of both lower extremities 08/20/2019   History of bariatric surgery 08/20/2019   Cardiac pacemaker 08/20/2019   Anxiety disorder 10/31/2018   Edema 10/31/2018   Heart murmur 10/31/2018   Osteoporosis 10/31/2018   Primary osteoarthritis of left knee 10/19/2018   History of pacemaker 09/12/2018   Shortness of breath 09/12/2018   Palpitations 06/20/2016   Postmenopausal bleeding 08/04/2015   Lymphedema 12/16/2014   Osteoarthritis 07/31/2014   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  Care Plan : RNCM: Adult plan of care for  Chronic Disease Management and Care Coordination Needs  Updates made by Vanita Ingles, RN since 10/11/2021 12:00 AM     Problem: RNCM: Adult plan of care for Chronic Disease  Management and Care Coordination Needs   Priority: High  Onset Date: 11/16/2020     Long-Range Goal: RNCM: Adult Plan of Care for Chronic Disease Management and Care Coordination Needs Completed 10/11/2021  Start Date: 11/16/2020  Expected End Date: 11/16/2021  Recent Progress: On track  Priority: High  Note:   Current Barriers: 10-11-2021: Closing the plan of care. The patient has met the goals of care. The patient knows the plan of care is being closed. Knows to call the [facility] if needed.  Knowledge Deficits related to plan of care for management of HTN, HLD, DMII, Depression, Anxiety with family circumstances and not caring for self, Depression: depressed mood, and chronic pain (RLS, OA, Chronic back pain),l and falls and safety concerns insomnia, fatigue, anxiety, disturbed sleep,, Caregiver Stress, Grief, Insomnia/Sleep Difficulties, and Stress at family circumstances, and grief process over the loss of her oldest daughter to [place] in October 2021  Care Coordination needs related to Limited social support, Level of care concerns, Mental Health Concerns , and Family and relationship dysfunction  Chronic Disease Management support and education needs related to HTN,  HLD, DMII, Depression, Anxiety with Social Anxiety,, Depression: depressed mood, falls prevention and safety and chronic pain  insomnia, fatigue, anxiety,, Caregiver Stress, Grief, Mood Instability, and Stress at family situations and circumstances, and grief at the loss of her oldest daughter last year in October of 2021 Lacks caregiver support.  Non-adherence to prescribed medication regimen 03-08-2021: New onset of cellulitis to left leg first seen in ER on 02-26-2021, follow up with pcp on 03-08-2021- 06-07-2021- resolved  RNCM Clinical Goal(s):  Patient will verbalize understanding of plan for management of HTN, HLD, DMII, Anxiety, Depression,  falls prevention, and Chronic pain  verbalize basic understanding of HLD, DMII,  Anxiety, Depression, falls prevention, and Chronic pain  disease process and self health management plan  take all medications exactly as prescribed and will call provider for medication related questions demonstrate understanding of rationale for each prescribed medication patient desires to work with pharm D for effective management of chronic conditions and how to take her medications as prescribed demonstrate improved and ongoing adherence to prescribed treatment plan for HTN, HLD, DMII, Anxiety, Depression, falls prevention, and chronic pain  as evidenced by adherence to ADA/ carb modified diet adherence to prescribed medication regimen contacting provider for new or worsened symptoms or questions and working with the CCM team for effective management of chronic diseases  demonstrate improved and ongoing health management independence by remaining safe in home environment and effective management of Chronic conditions  continue to work with Consulting civil engineer to address care management and care coordination needs related to HLD, DMII, Anxiety, Depression, and chronic pain and fall prevention   work with pharmacist to address medication concerns as the patient admits she does not take her medications as prescribed and struggles with managing her medications related to HTN, HLD, DMII, Anxiety, Depression, and chronic pain and falls prevention and safety through collaboration with RN Care manager, provider, and care team.   Interventions: 1:1 collaboration with primary care provider regarding development and update of comprehensive plan of care as evidenced by provider attestation and co-signature Inter-disciplinary care team collaboration (see longitudinal plan of  care) Evaluation of current treatment plan related to  self management and patient's adherence to plan as established by provider   SDOH Barriers (Status: Goal Met.) 10-11-2021: Goals met and care plan is being closed  Patient interviewed and  SDOH assessment performed        SDOH Interventions    Flowsheet Row Most Recent Value  SDOH Interventions   Housing Interventions Intervention Not Indicated, Other (Comment)  [has moved into a home with one of her daughters. Is doing much better]     Patient interviewed and appropriate assessments performed Provided patient with information about CCM team and working with the patient to manage chronic conditions and effectively manage health and well being. The patient wants to be proactive in her care and is willing to work with the team Discussed plans with patient for ongoing care management follow up and provided patient with direct contact information for care management team Advised patient to call the office for changes in condition or SDOH needs. The patient states that she is hoping by working with the CCM team she can remember best how to take her medications and take care of herself beter.  Collaborated with RN Case Manager re: ongoing support and education for management of chronic conditions and health and well being goals. 03-08-2021: The patient has moved into a house with her daughter in [place] and is doing very well. She had been out of town caring for her elderly father. She is doing well and in the office for a follow up today of left lower leg cellulitis. 08-09-2021: The patient had went to help take care of her husband who had knee replacement surgery but she is back home now and happy as she is eating better and sleeping better now.     Diabetes:  (Status: Goal Met.) 10-11-2021: Goals met and care plan is being closed  Lab Results  Component Value Date   HGBA1C 5.7 (H) 09/09/2020  08-09-2021: Will have new lab work in July.   Assessed patient's understanding of A1c goal: <7%. 08-09-2021: Knows the goal of A1C less than 7.0%. New labs scheduled for July Provided education to patient about basic DM disease process. 08-09-2021: The patient denies any issues with management of  her DM. She states she is doing well. Reviewed prescribed diet with patient 11-16-2020: Review of heart healthy/ADA diet. The patient eats small meals and sometimes is not eating what she should. Sometimes eats a bedtime snack sometimes not. The patient encouraged to follow a heart healthy/ADA diet. Will send information by my chart system for review and help with meal choices and options. 11-30-2020: Is eating well and denies any issues with compliance with heart healthy/ADA diet. 01-25-2021: Is in [place] taking care of her elderly father. She states she is actually doing very well right now. Denies any acute issues with management of DM or following dietary restrictions. 03-08-2021: The patient feels better and is happy that she is moved and into the new house with her daughter. The patient was in the office today for follow up of left lower leg cellulitis and states that her blood sugars are WNL and she is eating better. 06-07-2021: The patient states she is doing well and eating well. Denies any issues with her dietary intake. 08-09-2021: The patient was away for about 3 weeks helping take care of her husband after knee replacement surgery. She is back home and back on her regular dietary intake and eating much better now. She denies any  issues with dietary restrictions. ; Counseled on importance of regular laboratory monitoring as prescribed. 08-09-2021: The patient keeps appointments and has regular lab work.  Will have lab work done in July and her yearly physical       Discussed plans with patient for ongoing care management follow up and provided patient with direct contact information for care management team;      Provided patient with written educational materials related to hypo and hyperglycemia and importance of correct treatment. 11-16-2020: Reviewed with the patient the sx and sx of hypo and hyperglycemia. The patient does not check her blood sugars as recommended by the provider. She does have a  meter but does not take medications for DM and mainly controls DM with diet. Education and support given. The patient states that she will start taking blood sugars if she feels different. Denies lows; however states that sometimes at night she experiences "sweating". Eduction and support given. Will continue to monitor. 11-30-2020: The patient denies any lows at this time. States her blood sugar this am was 109. States she is doing better with management of her overall health and well being. Will continue to monitor.   03-08-2021: Saw patient face to face and provided a calendar for her with places to record blood sugar and blood pressure readings. Also gave her the healthy eating handout for reference and review. The patient has the [facility] new telephone number for needs and support between regular outreaches.  08-09-2021: The patient states her blood sugars are doing well. She denies any new concerns with her DM.   Knows how to effectively mange lows and highs.  Review of patient status, including review of consultants reports, relevant laboratory and other test results, and medications completed;       Eye exam last week (Today's date 06-07-2021). Will pick up new glasses on 06-15-2021  Falls:  (Status: Goal Met.) 10-11-2021: Goals met and care plan is being closed  Provided written and verbal education re: potential causes of falls and Fall prevention strategies Reviewed medications and discussed potential side effects of medications such as dizziness and frequent urination. 11-16-2020: The patient reviewed with the RNCM medications that she takes. Is following up with the pharm D as she forgets to take medications. Will monitor for medications that may cause her to have issues with fall hazards. 01-25-2021: Working with the Pharm D for medications management, questions and concerns. 08-09-2021: Ongoing support and education for medication needs from pharm D and RNCM.  Advised patient of importance of notifying  provider of falls. 08-09-2021: Knows to call the office for new falls or issues with safety. Reviewed with the patient today.  Assessed for signs and symptoms of orthostatic hypotension. 9-19-202: Denies any issues with orthostatic hypotension. Takes blood pressures sometimes at home. Advised the patient to take blood pressures and record. 08-09-2021: Denies any low blood pressures at this time. Assessed for falls since last encounter. 11-16-2020: The patient states that she has a fall about once a month. Last fall earlier this month. States no injuries. The patient states that she uses a cane when ambulating but sometimes falls because she doesn't use her cane. She is always cooking and cleaning and doing for others. She states its easy for her not to take good care of herself. 06-07-2021: The patient denies any new falls. Review of safety concerns and being cautious to prevent falls and injuries. Likely will close goal at next outreach if no new falls. 08-09-2021: The patient denies any  new falls but states she has had some near misses. Is having a lot of pain in her back and legs. Will leave goal open a little longer at this time to effectively manage fall and safety risk.  Assessed patients knowledge of fall risk prevention secondary to previously provided education Advised patient to discuss new falls, concerns, or questions  with provider  Depression and anxiety   (Status: Goal Met.) 10-11-2021: The patient has been under a little stress. Her elderly father passed away and she went to [place]. He was 62 and she was thankful that she was able to be there with him and her family before  he passed away. The patient says her grandson is living with her now and that is a little stressful but she feels she is doing okay with it. Her daughter takes very good care of her. She denies any acute findings today. She knows to call for changes or new needs if they arise.  Evaluation of current treatment plan related to  Anxiety and Depression, Limited social support, Level of care concerns, Mental Health Concerns , and Family and relationship dysfunction self-management and patient's adherence to plan as established by provider.  01-25-2021: The patient states that she is doing well right now. She is actually in [place] and she will be there until the end of December or later taking care of her elderly father. Review of medications and chronic conditions and the patient is doing well.  She states she is not stressed out and has her medications. Denies any new concerns. 03-08-2021: The patient states she is doing well. Her father is better and she is now home from [place]. She got completely moved into her daughters house this past weekend and she feels like this will be a positive and good change for her. She is resting well and getting her health under control. She denies any acute findings related to mental health today. 04-05-2021: The patient is doing well and happy. She states she will see the specialist for her back pain this week. The patient states she is happy with new living arrangements. 06-07-2021: The patient has been doing okay. Was in urgent care recently for running a high fever and also sore throat. She has been taking an abx and is feeling better. She has several MD appointments and states that she will see the orthopedic provider on 06-16-2021 for injections in her back. If the injections do not work then she will have to have surgery. Her daughter has been away but is coming back next week. She states they live together well. 08-09-2021: The patient states that she is "so-so". She is thankful to be back home to her home. She had been taking care of her husband post knee replacement. She is now back to her home and getting into her routine and focusing on self care and eating better. She is concerned about the back and leg pain she is experiencing. Has several test coming up to evaluate her pain and discomfort.  See pain plan of care for more details. Discussed plans with patient for ongoing care management follow up and provided patient with direct contact information for care management team Advised patient to call the office for changes in mood, axiety, depression or other mental health concerns; Provided education to patient re: managing stressors in life and working with the CCM team to effectively manage her depression and anxiety ; Reviewed medications with patient and discussed compliance. The patient sometimes forgets  to take her medications. Is open to ideas to help her with effective management of medications. 11-30-2020: Is doing better with management of her medications. Working with the pharm D and also setting alarms on her phone for 2 times a day to help her remember to take her medications. 08-09-2021: Endorses compliance with medications. States she has her medications and denies any concerns with medications at this time. Will continue to monitor. Is more compliant with medications; Collaborated with CCM team  regarding complex health needs and the patient wanting help in management of her chronic conditions. The patient states that her and her youngest daughter are going to be moving in about a month. She feels this will help her a whole lot as she is really stressed over cooking, cleaning and helping care for so many family members. She often feels overwhelmed and knows she is not taking care of herself. 11-30-2020: The patient is excited about moving in with her daughter. Her daughter closes on her new house on Friday and the patient will be moving in with her. She states she will still come and help some at her current home but it will be a lot less stressful on her. She is optimistic. Review of self care.  01-25-2021: The patient is doing well currently. Is in [place] taking care of her elderly father. Does not know when she will return back to [place]. Likely at the end of December. 08-09-2021; The  patient is doing well and denies any issues with her mental health and well being. Has support from the CCM team and is thankful for the support and ongoing education. Is thankful to be back at her home where she is back in her normal routine after helping out with her husband post knee replacement surgery. Provided patient with depression and anxiety  educational materials related to relieving stressors in her life; Social Work referral for help with ongoing support and stress relief measures for effective management of mental health well being. 08-09-2021: Ongoing support and education from the LCSW. Has been in contact with LCSW recently ; Pharmacy referral for help with management of medications and help with system that will help her. 08-09-2021: Continues to work with the [place] D on a regular basis. Has been in contact with the pharm D recently; Discussed plans with patient for ongoing care management follow up and provided patient with direct contact information for care management team; Advised patient to discuss new changes and stressors  with provider; Screening for signs and symptoms of depression related to chronic disease state;  Assessed social determinant of health barriers;   Hyperlipidemia:  (Status: Goal Met.) Lab Results  Component Value Date   CHOL 125 09/07/2021   HDL 54 09/07/2021   LDLCALC 58 09/07/2021   TRIG 58 09/07/2021   CHOLHDL 2.3 09/07/2021   New labs in July of 2023  Medication review performed; medication list updated in electronic medical record. 08-09-2021: The patient is taking Lipitor 40 mg QD. Has no issues with medications compliance. Provider established cholesterol goals reviewed. 08-09-2021: Is at goal with cholesterol levels.; Counseled on importance of regular laboratory monitoring as prescribed. 08-09-2021: Review of having regular lab work. New lab work scheduled for July 2023. Provided HLD educational materials; Reviewed role and benefits of statin for  ASCVD risk reduction; Discussed strategies to manage statin-induced myalgias; Reviewed importance of limiting foods high in cholesterol. 04-05-2021: Education and handouts provided at today's visit for healthy eating with information on HLD, HTN, and heart  health calendar. Reviewed exercise goals and target of 150 minutes per week;    Hypertension: (Status: Goal Met.) Long Term Goal  Last practice recorded BP readings:  BP Readings from Last 3 Encounters:  09/10/21 136/80  08/09/21 137/85  06/17/21 112/71  Most recent eGFR/CrCl:  Lab Results  Component Value Date   EGFR 95 09/07/2021    No components found for: CRCL  Evaluation of current treatment plan related to hypertension self management and patient's adherence to plan as established by provider. 03-08-2021; The patient states that she is doing well with her blood pressures at home and is not under the stress that she was with caring for her elderly father and other family members. The patient will elevation in the office, may be somewhat related to "white-coat" syndrome. Education and support given. 04-05-2021: The patient states that her blood pressures have been good and she has not had any high blood pressures. 06-07-2021: The patient has been having fluctuations in  her blood pressures. She has had several things going on and was in urgent care recently with headache, fever, and sore throat. Was placed on an abx. The patient is compliant with abx therapy. The patient states that she is doing better. She has an appointment tomorrow to see about her [facility], dentist on Wednesday and Thursday, cardiologist on Friday and orthopedic provider next week. Had eye exam last week. Denies any acute distress today. 08-09-2021: Blood pressures are more stable now. The patient did state that her blood pressure was a little high last week but she was having a lot of pain. The patient does check her blood pressure at home at times. Today at neurology office it was  137/85 with pulse of 71. Provided education to patient re: stroke prevention, s/s of heart attack and stroke. 08-09-2021: Review  Reviewed prescribed diet heart healthy/ADA diet  03-08-2021: Review and handout provided with educational information. 08-09-2021: The patient states that she is doing well with her dietary restrictions. Eating better since she has gotten back to her home where there are better and healthier food options.  Reviewed medications with patient and discussed importance of compliance. 08-09-2021: The patient is compliant with medications  Discussed plans with patient for ongoing care management follow up and provided patient with direct contact information for care management team; Advised patient, providing education and rationale, to monitor blood pressure daily and record, calling PCP for findings outside established parameters;  Advised patient to discuss changes in blood pressures, excessive highs, questions, or concerns, with provider; Provided education on prescribed diet heart healthy/ADA ;  Discussed complications of poorly controlled blood pressure such as heart disease, stroke, circulatory complications, vision complications, kidney impairment, sexual dysfunction;     Pain:  (Status: Goal on Track (progressing): YES. Goal Met.) 10-11-2021: The patient is working with the specialist and may have to have surgery. She does not know for sure. Most of the time her pain level is at a 7 sometimes it does go down some. She denies any acute distress today. Knows to call for changes or new needs.  Pain assessment performed. 04-05-2021: Rates her pain level at a 5 today in her back. The patient will see the specialist this week on Wednesday to evaluate her back pain and discomfort. 06-07-2021: The patient is rating her back pain at a 5 today on a scale of 0-10. Sees the specialist tomorrow for her RLS, sees the orthopedic provider for her back pain on 06-16-2021 and vascular surgeon on  06-17-2021.  The patient wants to talk to the specialist tomorrow about gabapentin as if she does not take it she can not sleep. She does not like that she is dependent on it to help her rest. Education and support given. Will continue to monitor for changes.    08-09-2021: The patient states that she is having pain still in her back and legs. She rates the pain at a 5/6 today. She saw the neurologist today and had medication changes.           Medications reviewed. 08-09-2021: Takes medications as directed. Review of medication changes and the patient is hopeful this will help with her back and RLS. Reviewed provider established plan for pain management. 11-16-2020: the patient states that she does take Tylenol for pain relief and discomfort at times. She stays very busy and she states she feels somewhat overwhelmed. She rates her pain today at a 4 on a scale of 0-10. 01-25-2021: Is effectively managing pain at this time. Denies any acute findings. Will continue to monitor. 03-08-2021: The patient has been dealing with cellulitis of left lower leg and was seen in ER on 02-26-2021, Urgent care on 03-01-2021 and pcp today. Per the patient the area is looing better and resolving. Will work with pcp for continued recommendations and support. 04-05-2021: The patient is going to the spine specialist this week to be evaluated for back pain on Wednesday. Called the Vein and Vascular specialist with the patient and secured the patient an appointment for March 6 at 0730 am with the specialist. The patient has not been there since 2021. She did see the neurologist about her RLS earlier in the month. She is thankful for the support of the CCM team. 06-07-2021: Is seeing several specialist for help with her chronic conditions. 08-09-2021: The patient states that she is going in August for MRI's to see if they can pinpoint the specific problem. She saw the neurologist today and has had changes in her medications. She is hopeful this will  help with her pain levels as sometimes it gets really bad.  Discussed importance of adherence to all scheduled medical appointments. 04-07-2021 with spine specialist. The patient has an appointment with the Vascular provider on 05-03-2021 at 730 am. Will call pcp for new changes or needs.  Knows to call for changes or needs. 06-07-2021: Eye exam last week, urgent care recently for sore throat and fever with headache, sees specialist on 06-08-2021 for RLS, dentist on 4-12 and 4-13, cardiologist on 4-14, orthopedic provider on 4-19 and vascular surgeon on 06-16-2021. Knows to call pcp for changes or new needs and concerns. 08-09-2021: Saw neurologist today and has upcoming appointments in July with the pcp and MRI scheduled for 09-30-2021. Counseled on the importance of reporting any/all new or changed pain symptoms or management strategies to pain management provider; Advised patient to report to care team affect of pain on daily activities; Discussed use of relaxation techniques and/or diversional activities to assist with pain reduction (distraction, imagery, relaxation, massage, acupressure, TENS, heat, and cold application; Reviewed with patient prescribed pharmacological and nonpharmacological pain relief strategies; Advised patient to discuss call the provider for changes in level or intensity of pain and discomfort with provider;   Left lower leg cellulitis   (Status: Goal Met.) Short Term Goal  08-09-2021: Closing this goal. Goal has been met. Evaluation of current treatment plan related to  Left lower leg cellulitis ,  self-management and patient's adherence to plan as established by provider. 04-05-2021:  The patient will see the vascular provider on May 03, 2021 at 11 for evaluation of bilateral statis dermatitis and venous insufficiency. The patient states the cellulitis is better in her left leg but still having issues with this and states she had called several times and left messages for them to call her  back and schedule an appointment. RNCM called with the patient today to schedule an appointment with the vein and vascular. Appointment secured for 05-03-2021 at 0730 am. 06-07-2021: The patient states she is stable. She follows up with vascular provider on 06-17-2021 and Dr. Jorja Loa tomorrow for RLS. She is compliant with the plan of care.  Discussed plans with patient for ongoing care management follow up and provided patient with direct contact information for care management team Advised patient to call the office for changes in cellulitis to left lower leg, drainage, sx and sx of infection, questions, or concerns; Provided education to patient re: monitoring for changes to site, monitoring for sx and sx of infection, hygiene and care to the infected area,; Reviewed medications with patient and discussed compliance and to take all antibiotics until course is done. ; Reviewed scheduled/upcoming provider appointments including saw pcp today in the office. 06-07-2021: Upcoming appointment 06-17-2021 for evaluation at vein and vascular specialist; Discussed plans with patient for ongoing care management follow up and provided patient with direct contact information for care management team; Advised patient to discuss changes in the are of the cellulitis, questions, or concerns with provider;   Patient Goals/Self-Care Activities: Patient will self administer medications as prescribed Patient will attend all scheduled provider appointments Patient will call pharmacy for medication refills Patient will attend church or other social activities Patient will continue to perform ADL's independently Patient will continue to perform IADL's independently Patient will call provider office for new concerns or questions Patient will work with BSW to address care coordination needs and will continue to work with the clinical team to address health care and disease management related needs.         Plan:No further  follow up required: the patient has met the goals of care and does not need regular outreaches. Has RNCM number to call for new concerns or changes.   Noreene Larsson RN, MSN, [place] Mobile: [phone]

## 2021-10-11 NOTE — Patient Instructions (Signed)
Visit Information  Thank you for taking time to visit with me today. Please don't hesitate to contact me if I can be of assistance to you before our next scheduled telephone appointment.  Following are the goals we discussed today:  Current Barriers: 10-11-2021: Closing the plan of care. The patient has met the goals of care. The patient knows the plan of care is being closed. Knows to call the [facility] if needed.  Knowledge Deficits related to plan of care for management of HTN, HLD, DMII, Depression, Anxiety with family circumstances and not caring for self, Depression: depressed mood, and chronic pain (RLS, OA, Chronic back pain),l and falls and safety concerns insomnia, fatigue, anxiety, disturbed sleep,, Caregiver Stress, Grief, Insomnia/Sleep Difficulties, and Stress at family circumstances, and grief process over the loss of her oldest daughter to [place] in October 2021  Care Coordination needs related to Limited social support, Level of care concerns, Mental Health Concerns , and Family and relationship dysfunction  Chronic Disease Management support and education needs related to HTN,  HLD, DMII, Depression, Anxiety with Social Anxiety,, Depression: depressed mood, falls prevention and safety and chronic pain  insomnia, fatigue, anxiety,, Caregiver Stress, Grief, Mood Instability, and Stress at family situations and circumstances, and grief at the loss of her oldest daughter last year in October of 2021 Lacks caregiver support.  Non-adherence to prescribed medication regimen 03-08-2021: New onset of cellulitis to left leg first seen in ER on 02-26-2021, follow up with pcp on 03-08-2021- 06-07-2021- resolved   RNCM Clinical Goal(s):  Patient will verbalize understanding of plan for management of HTN, HLD, DMII, Anxiety, Depression,  falls prevention, and Chronic pain  verbalize basic understanding of HLD, DMII, Anxiety, Depression, falls prevention, and Chronic pain  disease process and self  health management plan  take all medications exactly as prescribed and will call provider for medication related questions demonstrate understanding of rationale for each prescribed medication patient desires to work with pharm D for effective management of chronic conditions and how to take her medications as prescribed demonstrate improved and ongoing adherence to prescribed treatment plan for HTN, HLD, DMII, Anxiety, Depression, falls prevention, and chronic pain  as evidenced by adherence to ADA/ carb modified diet adherence to prescribed medication regimen contacting provider for new or worsened symptoms or questions and working with the CCM team for effective management of chronic diseases  demonstrate improved and ongoing health management independence by remaining safe in home environment and effective management of Chronic conditions  continue to work with Consulting civil engineer to address care management and care coordination needs related to HLD, DMII, Anxiety, Depression, and chronic pain and fall prevention   work with pharmacist to address medication concerns as the patient admits she does not take her medications as prescribed and struggles with managing her medications related to HTN, HLD, DMII, Anxiety, Depression, and chronic pain and falls prevention and safety through collaboration with RN Care manager, provider, and care team.    Interventions: 1:1 collaboration with primary care provider regarding development and update of comprehensive plan of care as evidenced by provider attestation and co-signature Inter-disciplinary care team collaboration (see longitudinal plan of care) Evaluation of current treatment plan related to  self management and patient's adherence to plan as established by provider     SDOH Barriers (Status: Goal Met.) 10-11-2021: Goals met and care plan is being closed  Patient interviewed and SDOH assessment performed        SDOH Interventions     Flowsheet  Row Most  Recent Value  SDOH Interventions    Housing Interventions Intervention Not Indicated, Other (Comment)  [has moved into a home with one of her daughters. Is doing much better]       Patient interviewed and appropriate assessments performed Provided patient with information about CCM team and working with the patient to manage chronic conditions and effectively manage health and well being. The patient wants to be proactive in her care and is willing to work with the team Discussed plans with patient for ongoing care management follow up and provided patient with direct contact information for care management team Advised patient to call the office for changes in condition or SDOH needs. The patient states that she is hoping by working with the CCM team she can remember best how to take her medications and take care of herself beter.  Collaborated with RN Case Manager re: ongoing support and education for management of chronic conditions and health and well being goals. 03-08-2021: The patient has moved into a house with her daughter in [place] and is doing very well. She had been out of town caring for her elderly father. She is doing well and in the office for a follow up today of left lower leg cellulitis. 08-09-2021: The patient had went to help take care of her husband who had knee replacement surgery but she is back home now and happy as she is eating better and sleeping better now.        Diabetes:  (Status: Goal Met.) 10-11-2021: Goals met and care plan is being closed       Lab Results  Component Value Date    HGBA1C 5.7 (H) 09/09/2020  08-09-2021: Will have new lab work in July.    Assessed patient's understanding of A1c goal: <7%. 08-09-2021: Knows the goal of A1C less than 7.0%. New labs scheduled for July Provided education to patient about basic DM disease process. 08-09-2021: The patient denies any issues with management of her DM. She states she is doing well. Reviewed prescribed diet  with patient 11-16-2020: Review of heart healthy/ADA diet. The patient eats small meals and sometimes is not eating what she should. Sometimes eats a bedtime snack sometimes not. The patient encouraged to follow a heart healthy/ADA diet. Will send information by my chart system for review and help with meal choices and options. 11-30-2020: Is eating well and denies any issues with compliance with heart healthy/ADA diet. 01-25-2021: Is in [place] taking care of her elderly father. She states she is actually doing very well right now. Denies any acute issues with management of DM or following dietary restrictions. 03-08-2021: The patient feels better and is happy that she is moved and into the new house with her daughter. The patient was in the office today for follow up of left lower leg cellulitis and states that her blood sugars are WNL and she is eating better. 06-07-2021: The patient states she is doing well and eating well. Denies any issues with her dietary intake. 08-09-2021: The patient was away for about 3 weeks helping take care of her husband after knee replacement surgery. She is back home and back on her regular dietary intake and eating much better now. She denies any issues with dietary restrictions. ; Counseled on importance of regular laboratory monitoring as prescribed. 08-09-2021: The patient keeps appointments and has regular lab work.  Will have lab work done in July and her yearly physical       Discussed  plans with patient for ongoing care management follow up and provided patient with direct contact information for care management team;      Provided patient with written educational materials related to hypo and hyperglycemia and importance of correct treatment. 11-16-2020: Reviewed with the patient the sx and sx of hypo and hyperglycemia. The patient does not check her blood sugars as recommended by the provider. She does have a meter but does not take medications for DM and mainly controls DM  with diet. Education and support given. The patient states that she will start taking blood sugars if she feels different. Denies lows; however states that sometimes at night she experiences "sweating". Eduction and support given. Will continue to monitor. 11-30-2020: The patient denies any lows at this time. States her blood sugar this am was 109. States she is doing better with management of her overall health and well being. Will continue to monitor.   03-08-2021: Saw patient face to face and provided a calendar for her with places to record blood sugar and blood pressure readings. Also gave her the healthy eating handout for reference and review. The patient has the [facility] new telephone number for needs and support between regular outreaches.  08-09-2021: The patient states her blood sugars are doing well. She denies any new concerns with her DM.   Knows how to effectively mange lows and highs.  Review of patient status, including review of consultants reports, relevant laboratory and other test results, and medications completed;       Eye exam last week (Today's date 06-07-2021). Will pick up new glasses on 06-15-2021   Falls:  (Status: Goal Met.) 10-11-2021: Goals met and care plan is being closed  Provided written and verbal education re: potential causes of falls and Fall prevention strategies Reviewed medications and discussed potential side effects of medications such as dizziness and frequent urination. 11-16-2020: The patient reviewed with the RNCM medications that she takes. Is following up with the pharm D as she forgets to take medications. Will monitor for medications that may cause her to have issues with fall hazards. 01-25-2021: Working with the Pharm D for medications management, questions and concerns. 08-09-2021: Ongoing support and education for medication needs from pharm D and RNCM.  Advised patient of importance of notifying provider of falls. 08-09-2021: Knows to call the office for new falls  or issues with safety. Reviewed with the patient today.  Assessed for signs and symptoms of orthostatic hypotension. 9-19-202: Denies any issues with orthostatic hypotension. Takes blood pressures sometimes at home. Advised the patient to take blood pressures and record. 08-09-2021: Denies any low blood pressures at this time. Assessed for falls since last encounter. 11-16-2020: The patient states that she has a fall about once a month. Last fall earlier this month. States no injuries. The patient states that she uses a cane when ambulating but sometimes falls because she doesn't use her cane. She is always cooking and cleaning and doing for others. She states its easy for her not to take good care of herself. 06-07-2021: The patient denies any new falls. Review of safety concerns and being cautious to prevent falls and injuries. Likely will close goal at next outreach if no new falls. 08-09-2021: The patient denies any new falls but states she has had some near misses. Is having a lot of pain in her back and legs. Will leave goal open a little longer at this time to effectively manage fall and safety risk.  Assessed patients knowledge  of fall risk prevention secondary to previously provided education Advised patient to discuss new falls, concerns, or questions  with provider   Depression and anxiety   (Status: Goal Met.) 10-11-2021: The patient has been under a little stress. Her elderly father passed away and she went to [place]. He was 32 and she was thankful that she was able to be there with him and her family before  he passed away. The patient says her grandson is living with her now and that is a little stressful but she feels she is doing okay with it. Her daughter takes very good care of her. She denies any acute findings today. She knows to call for changes or new needs if they arise.  Evaluation of current treatment plan related to Anxiety and Depression, Limited social support, Level of care  concerns, Mental Health Concerns , and Family and relationship dysfunction self-management and patient's adherence to plan as established by provider.  01-25-2021: The patient states that she is doing well right now. She is actually in [place] and she will be there until the end of December or later taking care of her elderly father. Review of medications and chronic conditions and the patient is doing well.  She states she is not stressed out and has her medications. Denies any new concerns. 03-08-2021: The patient states she is doing well. Her father is better and she is now home from [place]. She got completely moved into her daughters house this past weekend and she feels like this will be a positive and good change for her. She is resting well and getting her health under control. She denies any acute findings related to mental health today. 04-05-2021: The patient is doing well and happy. She states she will see the specialist for her back pain this week. The patient states she is happy with new living arrangements. 06-07-2021: The patient has been doing okay. Was in urgent care recently for running a high fever and also sore throat. She has been taking an abx and is feeling better. She has several MD appointments and states that she will see the orthopedic provider on 06-16-2021 for injections in her back. If the injections do not work then she will have to have surgery. Her daughter has been away but is coming back next week. She states they live together well. 08-09-2021: The patient states that she is "so-so". She is thankful to be back home to her home. She had been taking care of her husband post knee replacement. She is now back to her home and getting into her routine and focusing on self care and eating better. She is concerned about the back and leg pain she is experiencing. Has several test coming up to evaluate her pain and discomfort. See pain plan of care for more details. Discussed plans with  patient for ongoing care management follow up and provided patient with direct contact information for care management team Advised patient to call the office for changes in mood, axiety, depression or other mental health concerns; Provided education to patient re: managing stressors in life and working with the CCM team to effectively manage her depression and anxiety ; Reviewed medications with patient and discussed compliance. The patient sometimes forgets to take her medications. Is open to ideas to help her with effective management of medications. 11-30-2020: Is doing better with management of her medications. Working with the pharm D and also setting alarms on her phone for 2 times a  day to help her remember to take her medications. 08-09-2021: Endorses compliance with medications. States she has her medications and denies any concerns with medications at this time. Will continue to monitor. Is more compliant with medications; Collaborated with CCM team  regarding complex health needs and the patient wanting help in management of her chronic conditions. The patient states that her and her youngest daughter are going to be moving in about a month. She feels this will help her a whole lot as she is really stressed over cooking, cleaning and helping care for so many family members. She often feels overwhelmed and knows she is not taking care of herself. 11-30-2020: The patient is excited about moving in with her daughter. Her daughter closes on her new house on Friday and the patient will be moving in with her. She states she will still come and help some at her current home but it will be a lot less stressful on her. She is optimistic. Review of self care.  01-25-2021: The patient is doing well currently. Is in [place] taking care of her elderly father. Does not know when she will return back to [place]. Likely at the end of December. 08-09-2021; The patient is doing well and denies any issues with her mental  health and well being. Has support from the CCM team and is thankful for the support and ongoing education. Is thankful to be back at her home where she is back in her normal routine after helping out with her husband post knee replacement surgery. Provided patient with depression and anxiety  educational materials related to relieving stressors in her life; Social Work referral for help with ongoing support and stress relief measures for effective management of mental health well being. 08-09-2021: Ongoing support and education from the LCSW. Has been in contact with LCSW recently ; Pharmacy referral for help with management of medications and help with system that will help her. 08-09-2021: Continues to work with the [place] D on a regular basis. Has been in contact with the pharm D recently; Discussed plans with patient for ongoing care management follow up and provided patient with direct contact information for care management team; Advised patient to discuss new changes and stressors  with provider; Screening for signs and symptoms of depression related to chronic disease state;  Assessed social determinant of health barriers;    Hyperlipidemia:  (Status: Goal Met.)      Lab Results  Component Value Date    CHOL 125 09/07/2021    HDL 54 09/07/2021    LDLCALC 58 09/07/2021    TRIG 58 09/07/2021    CHOLHDL 2.3 09/07/2021   New labs in July of 2023   Medication review performed; medication list updated in electronic medical record. 08-09-2021: The patient is taking Lipitor 40 mg QD. Has no issues with medications compliance. Provider established cholesterol goals reviewed. 08-09-2021: Is at goal with cholesterol levels.; Counseled on importance of regular laboratory monitoring as prescribed. 08-09-2021: Review of having regular lab work. New lab work scheduled for July 2023. Provided HLD educational materials; Reviewed role and benefits of statin for ASCVD risk reduction; Discussed strategies to  manage statin-induced myalgias; Reviewed importance of limiting foods high in cholesterol. 04-05-2021: Education and handouts provided at today's visit for healthy eating with information on HLD, HTN, and heart health calendar. Reviewed exercise goals and target of 150 minutes per week;       Hypertension: (Status: Goal Met.) Long Term Goal  Last practice recorded  BP readings:     BP Readings from Last 3 Encounters:  09/10/21 136/80  08/09/21 137/85  06/17/21 112/71  Most recent eGFR/CrCl:       Lab Results  Component Value Date    EGFR 95 09/07/2021    No components found for: CRCL   Evaluation of current treatment plan related to hypertension self management and patient's adherence to plan as established by provider. 03-08-2021; The patient states that she is doing well with her blood pressures at home and is not under the stress that she was with caring for her elderly father and other family members. The patient will elevation in the office, may be somewhat related to "white-coat" syndrome. Education and support given. 04-05-2021: The patient states that her blood pressures have been good and she has not had any high blood pressures. 06-07-2021: The patient has been having fluctuations in  her blood pressures. She has had several things going on and was in urgent care recently with headache, fever, and sore throat. Was placed on an abx. The patient is compliant with abx therapy. The patient states that she is doing better. She has an appointment tomorrow to see about her [facility], dentist on Wednesday and Thursday, cardiologist on Friday and orthopedic provider next week. Had eye exam last week. Denies any acute distress today. 08-09-2021: Blood pressures are more stable now. The patient did state that her blood pressure was a little high last week but she was having a lot of pain. The patient does check her blood pressure at home at times. Today at neurology office it was 137/85 with pulse of  71. Provided education to patient re: stroke prevention, s/s of heart attack and stroke. 08-09-2021: Review  Reviewed prescribed diet heart healthy/ADA diet  03-08-2021: Review and handout provided with educational information. 08-09-2021: The patient states that she is doing well with her dietary restrictions. Eating better since she has gotten back to her home where there are better and healthier food options.  Reviewed medications with patient and discussed importance of compliance. 08-09-2021: The patient is compliant with medications  Discussed plans with patient for ongoing care management follow up and provided patient with direct contact information for care management team; Advised patient, providing education and rationale, to monitor blood pressure daily and record, calling PCP for findings outside established parameters;  Advised patient to discuss changes in blood pressures, excessive highs, questions, or concerns, with provider; Provided education on prescribed diet heart healthy/ADA ;  Discussed complications of poorly controlled blood pressure such as heart disease, stroke, circulatory complications, vision complications, kidney impairment, sexual dysfunction;       Pain:  (Status: Goal on Track (progressing): YES. Goal Met.) 10-11-2021: The patient is working with the specialist and may have to have surgery. She does not know for sure. Most of the time her pain level is at a 7 sometimes it does go down some. She denies any acute distress today. Knows to call for changes or new needs.  Pain assessment performed. 04-05-2021: Rates her pain level at a 5 today in her back. The patient will see the specialist this week on Wednesday to evaluate her back pain and discomfort. 06-07-2021: The patient is rating her back pain at a 5 today on a scale of 0-10. Sees the specialist tomorrow for her RLS, sees the orthopedic provider for her back pain on 06-16-2021 and vascular surgeon on 06-17-2021. The patient  wants to talk to the specialist tomorrow about gabapentin as if she  does not take it she can not sleep. She does not like that she is dependent on it to help her rest. Education and support given. Will continue to monitor for changes.    08-09-2021: The patient states that she is having pain still in her back and legs. She rates the pain at a 5/6 today. She saw the neurologist today and had medication changes.           Medications reviewed. 08-09-2021: Takes medications as directed. Review of medication changes and the patient is hopeful this will help with her back and RLS. Reviewed provider established plan for pain management. 11-16-2020: the patient states that she does take Tylenol for pain relief and discomfort at times. She stays very busy and she states she feels somewhat overwhelmed. She rates her pain today at a 4 on a scale of 0-10. 01-25-2021: Is effectively managing pain at this time. Denies any acute findings. Will continue to monitor. 03-08-2021: The patient has been dealing with cellulitis of left lower leg and was seen in ER on 02-26-2021, Urgent care on 03-01-2021 and pcp today. Per the patient the area is looing better and resolving. Will work with pcp for continued recommendations and support. 04-05-2021: The patient is going to the spine specialist this week to be evaluated for back pain on Wednesday. Called the Vein and Vascular specialist with the patient and secured the patient an appointment for March 6 at 0730 am with the specialist. The patient has not been there since 2021. She did see the neurologist about her RLS earlier in the month. She is thankful for the support of the CCM team. 06-07-2021: Is seeing several specialist for help with her chronic conditions. 08-09-2021: The patient states that she is going in August for MRI's to see if they can pinpoint the specific problem. She saw the neurologist today and has had changes in her medications. She is hopeful this will help with her pain  levels as sometimes it gets really bad.  Discussed importance of adherence to all scheduled medical appointments. 04-07-2021 with spine specialist. The patient has an appointment with the Vascular provider on 05-03-2021 at 730 am. Will call pcp for new changes or needs.  Knows to call for changes or needs. 06-07-2021: Eye exam last week, urgent care recently for sore throat and fever with headache, sees specialist on 06-08-2021 for RLS, dentist on 4-12 and 4-13, cardiologist on 4-14, orthopedic provider on 4-19 and vascular surgeon on 06-16-2021. Knows to call pcp for changes or new needs and concerns. 08-09-2021: Saw neurologist today and has upcoming appointments in July with the pcp and MRI scheduled for 09-30-2021. Counseled on the importance of reporting any/all new or changed pain symptoms or management strategies to pain management provider; Advised patient to report to care team affect of pain on daily activities; Discussed use of relaxation techniques and/or diversional activities to assist with pain reduction (distraction, imagery, relaxation, massage, acupressure, TENS, heat, and cold application; Reviewed with patient prescribed pharmacological and nonpharmacological pain relief strategies; Advised patient to discuss call the provider for changes in level or intensity of pain and discomfort with provider;     Left lower leg cellulitis   (Status: Goal Met.) Short Term Goal  08-09-2021: Closing this goal. Goal has been met. Evaluation of current treatment plan related to  Left lower leg cellulitis ,  self-management and patient's adherence to plan as established by provider. 04-05-2021: The patient will see the vascular provider on May 03, 2021 at  730 for evaluation of bilateral statis dermatitis and venous insufficiency. The patient states the cellulitis is better in her left leg but still having issues with this and states she had called several times and left messages for them to call her back and  schedule an appointment. RNCM called with the patient today to schedule an appointment with the vein and vascular. Appointment secured for 05-03-2021 at 0730 am. 06-07-2021: The patient states she is stable. She follows up with vascular provider on 06-17-2021 and Dr. Jorja Loa tomorrow for RLS. She is compliant with the plan of care.  Discussed plans with patient for ongoing care management follow up and provided patient with direct contact information for care management team Advised patient to call the office for changes in cellulitis to left lower leg, drainage, sx and sx of infection, questions, or concerns; Provided education to patient re: monitoring for changes to site, monitoring for sx and sx of infection, hygiene and care to the infected area,; Reviewed medications with patient and discussed compliance and to take all antibiotics until course is done. ; Reviewed scheduled/upcoming provider appointments including saw pcp today in the office. 06-07-2021: Upcoming appointment 06-17-2021 for evaluation at vein and vascular specialist; Discussed plans with patient for ongoing care management follow up and provided patient with direct contact information for care management team; Advised patient to discuss changes in the are of the cellulitis, questions, or concerns with provider;    Patient Goals/Self-Care Activities: Patient will self administer medications as prescribed Patient will attend all scheduled provider appointments Patient will call pharmacy for medication refills Patient will attend church or other social activities Patient will continue to perform ADL's independently Patient will continue to perform IADL's independently Patient will call provider office for new concerns or questions Patient will work with BSW to address care coordination needs and will continue to work with the clinical team to address health care and disease management related needs.          Please call the care  guide team at [phone] if you need to schedule an appointment.   If you are experiencing a Mental Health or [place] or need someone to talk to, please call the Suicide and Crisis Lifeline: 988 call the [place] National Suicide Prevention Lifeline: 5811969822 or TTY: (816) 793-6173 TTY (219)192-1038) to talk to a trained counselor call 1-800-273-TALK (toll free, 24 hour hotline)   Patient verbalizes understanding of instructions and care plan provided today and agrees to view in [place]. Active MyChart status and patient understanding of how to access instructions and care plan via MyChart confirmed with patient.     Noreene Larsson RN, MSN, [place] Mobile: [phone]

## 2021-10-11 NOTE — Chronic Care Management (AMB) (Signed)
Care Management Clinical Social Work Note  10/11/2021 Name: Janet [name] MRN: [id number] DOB: 06/25/1950  Janet [name] is a 71 y.o. year old female who is a primary care patient of Smitty Cords, DO.  The Care Management team was consulted for assistance with chronic disease management and coordination needs.  Engaged with patient by telephone for follow up visit in response to provider referral for social work chronic care management and care coordination services  Consent to Services:  Ms. Casamento was given information about Care Management services today including:  Care Management services includes personalized support from designated clinical staff supervised by her physician, including individualized plan of care and coordination with other care providers 24/7 contact phone numbers for assistance for urgent and routine care needs. The patient may stop case management services at any time by phone call to the office staff.  Patient agreed to services and consent obtained.   Assessment: Review of patient past medical history, allergies, medications, and health status, including review of relevant consultants reports was performed today as part of a comprehensive evaluation and provision of chronic care management and care coordination services.  SDOH (Social Determinants of Health) assessments and interventions performed:    Advanced Directives Status: Not addressed in this encounter.  Care Plan  No Known Allergies  Outpatient Encounter Medications as of 10/05/2021  Medication Sig Note   aspirin 81 MG EC tablet Take 81 mg by mouth daily.    atorvastatin (LIPITOR) 40 MG tablet Take 40 mg by mouth at bedtime.    baclofen (LIORESAL) 10 MG tablet TAKE 1 TABLET(10 MG) BY MOUTH AT BEDTIME AS NEEDED FOR MUSCLE SPASMS    DULoxetine (CYMBALTA) 30 MG capsule TAKE 1 CAPSULE(30 MG) BY MOUTH DAILY    gabapentin (NEURONTIN) 300 MG capsule Take 300 mg by mouth 3 (three) times daily. 12/04/2020:  Taking 1 capsule (300 mg) in the morning and 2 capsules  (600 mg) at night as directed by Neurologist.   losartan-hydrochlorothiazide (HYZAAR) 100-25 MG tablet TAKE 1 TABLET BY MOUTH DAILY    meloxicam (MOBIC) 15 MG tablet Take 15 mg by mouth daily as needed. (Patient not taking: Reported on 09/10/2021)    pramipexole (MIRAPEX) 1 MG tablet Take 1 tablet (1 mg total) by mouth 2 (two) times daily.    No facility-administered encounter medications on file as of 10/05/2021.    Patient Active Problem List   Diagnosis Date Noted   Atherosclerosis of left carotid artery 07/20/2021   Chronic left-sided low back pain with left-sided sciatica 04/14/2020   History of deep vein thrombosis 09/16/2019   Type 2 diabetes mellitus with other specified complication (HCC) 08/20/2019   Restless leg syndrome 08/20/2019   Major depression, recurrent, full remission (HCC) 08/20/2019   Other insomnia 08/20/2019   Symptomatic varicose veins, bilateral 08/20/2019   Edema of left lower extremity 08/20/2019   Primary osteoarthritis involving multiple joints 08/20/2019   Venous insufficiency of both lower extremities 08/20/2019   History of bariatric surgery 08/20/2019   Cardiac pacemaker 08/20/2019   Anxiety disorder 10/31/2018   Edema 10/31/2018   Heart murmur 10/31/2018   Osteoporosis 10/31/2018   Primary osteoarthritis of left knee 10/19/2018   History of pacemaker 09/12/2018   Shortness of breath 09/12/2018   Palpitations 06/20/2016   Postmenopausal bleeding 08/04/2015   Lymphedema 12/16/2014   Osteoarthritis 07/31/2014   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   Gastroesophageal reflux disease 07/31/2014   Hyperlipidemia associated with type 2 diabetes mellitus (HCC) 07/31/2014   Morbid obesity (HCC)  07/31/2014   Obstructive sleep apnea syndrome 07/31/2014   Peripheral nerve disease 07/31/2014   Urinary incontinence 07/31/2014   History of adenomatous polyp of colon 02/28/2014    Conditions to be  addressed/monitored: DMII, Anxiety, Depression, and Osteoarthritis  Care Plan : LCSW Plan of Care  Updates made by Bridgett Larsson, LCSW since 10/11/2021 12:00 AM     Problem: Coping Skills (General Plan of Care)      Long-Range Goal: Coping Skills Enhanced Completed 10/05/2021  Start Date: 02/24/2021  Expected End Date: 10/28/2021  This Visit's Progress: On track  Recent Progress: On track  Priority: High  Note:   Current barriers:   Chronic Mental Health needs related to Depression and Anxiety Limited social support and Family and relationship dysfunction Needs Support, Education, and Care Coordination in order to meet unmet mental health needs. Clinical Goal(s): demonstrate a reduction in symptoms related to :Depression: anxiety   Clinical Interventions:  Assessed patient's previous and current treatment, coping skills, support system and barriers to care  Patient continues to receive strong support from daughters Patient reports improved compliance with medication management and was successful in identifying healthy coping skills  Active listening / Reflection utilized  Emotional Support Provided Verbalization of feelings encouraged  Review resources and discussed options for any psychosocial needs. None identified at visit Inter-disciplinary care team collaboration (see longitudinal plan of care) Patient Goals/Self-Care Activities: Over the next 120 days Attend scheduled appts with providers Utilize stress management strategies discussed Contact PCP office with any questions or concerns        Follow Up Plan: No follow up required as no resource needs identified. Pt can contact PCP office with any questions or resource needs  Jenel Lucks, MSW, LCSW [facility] Management [place]  [facility] [email or web address] Phone [phone] 8:13 AM

## 2021-10-11 NOTE — Patient Instructions (Signed)
Visit Information  Thank you for taking time to visit with me today. Please don't hesitate to contact me if I can be of assistance to you before our next scheduled telephone appointment.  Following are the goals we discussed today:  Patient Goals/Self-Care Activities: Over the next 120 days Attend scheduled appts with providers Utilize stress management strategies discussed Contact PCP office with any questions or concerns  If you are experiencing a Mental Health or Behavioral Health Crisis or need someone to talk to, please call the Suicide and Crisis Lifeline: 988 call 911   Patient verbalizes understanding of instructions and care plan provided today and agrees to view in MyChart. Active MyChart status and patient understanding of how to access instructions and care plan via MyChart confirmed with patient.     No resource needs identified  Jenel Lucks, MSW, LCSW [facility] Management [place]  [facility] [email or web address] Phone [phone] 8:14 AM

## 2021-10-21 ENCOUNTER — Encounter: Payer: Self-pay | Admitting: Neurosurgery

## 2021-10-21 ENCOUNTER — Ambulatory Visit: Payer: Medicare Other | Admitting: Neurosurgery

## 2021-10-21 ENCOUNTER — Other Ambulatory Visit: Payer: Self-pay

## 2021-10-21 VITALS — BP 120/76 | HR 77 | Ht 62.0 in | Wt 218.0 lb

## 2021-10-21 DIAGNOSIS — M4802 Spinal stenosis, cervical region: Secondary | ICD-10-CM

## 2021-10-21 DIAGNOSIS — M4310 Spondylolisthesis, site unspecified: Secondary | ICD-10-CM

## 2021-10-21 DIAGNOSIS — G959 Disease of spinal cord, unspecified: Secondary | ICD-10-CM | POA: Diagnosis not present

## 2021-10-21 DIAGNOSIS — M48062 Spinal stenosis, lumbar region with neurogenic claudication: Secondary | ICD-10-CM | POA: Diagnosis not present

## 2021-10-21 DIAGNOSIS — M4012 Other secondary kyphosis, cervical region: Secondary | ICD-10-CM

## 2021-10-21 DIAGNOSIS — M4316 Spondylolisthesis, lumbar region: Secondary | ICD-10-CM

## 2021-10-21 DIAGNOSIS — Z01818 Encounter for other preprocedural examination: Secondary | ICD-10-CM

## 2021-10-21 NOTE — H&P (View-Only) (Signed)
Referring Physician:  Smitty Cords, DO [address] [place],  [place] [address]  Primary Physician:  Smitty Cords, DO  History of Present Illness: 10/21/2021 [name] is here today with a chief complaint of back and leg pain.  It has worsened over time.  Activity and working on her feet bother her.  She can walk 5 minutes before she has to stop due to leg weakness and discomfort in her back and legs.   She is also having pain down her arms.  She gets numbness and tingling in her hands.  She has had issues with her balance, and has had some falls.  She is walking very slowly to try to keep from falling.    06/11/2020 [name] is here today with a chief complaint of low back pain and left lateral leg pain and numbness. She reports bilateral leg weakness for the past 2 years.  She has had back trouble for several years. She previously underwent a course of conservative management several years ago, and it helped her significantly. She now has worsening symptoms and has significant trouble with pain down her left leg when she walks or stands. She has to walk very slowly, and can only walk approximately 15 minutes. Her pain gets worse as the day goes on and she ultimately feels that she is bent forward.  Bowel/Bladder Dysfunction: none  Conservative measures:  Physical therapy: has not participated Multimodal medical therapy including regular antiinflammatories: baclofen, gabapentin,  Injections: has received epidural steroid injections about 3 years ago in [place], [place]  Past Surgery: none  Janet [name] has no symptoms of cervical myelopathy.  The symptoms are causing a significant impact on the patient's life.   Review of Systems:  A 10 point review of systems is negative, except for the pertinent positives and negatives detailed in the HPI.  Past Medical History: Past Medical History:  Diagnosis Date   GERD (gastroesophageal reflux disease)     Heart murmur    Hyperlipidemia    Hypertension    PAD (peripheral artery disease) (HCC)    Restless leg    Sleep apnea    Urinary incontinence     Past Surgical History: Past Surgical History:  Procedure Laterality Date   APPENDECTOMY  2014   CARPAL TUNNEL RELEASE Left 1998   DG C-ARM 2 VIEW LEFT SHOULDER ([facility] HX)     DG C-ARM 2 VIEW RIGHT SHOULDER ([facility] HX)     ESOPHAGEAL DILATION  2012   2015, 2017, 2020   GASTRIC BYPASS  2019   HERNIA REPAIR  2019   left knee manipulation Left 02/05/2019   PACEMAKER IMPLANT  2017   POLYPECTOMY     SHOULDER SURGERY Left 2014   SHOULDER SURGERY Right 2017   TOTAL KNEE ARTHROPLASTY Right 2018   TOTAL KNEE ARTHROPLASTY Left 10/18/2018   vein removal Bilateral 2012    Allergies: Allergies as of 10/21/2021   (No Known Allergies)    Medications: No outpatient medications have been marked as taking for the 10/21/21 encounter (Office Visit) with [name], MD.    Social History: Social History   Tobacco Use   Smoking status: Former    Packs/day: 1.00    Years: 25.00    Total pack years: 25.00    Types: Cigarettes   Smokeless tobacco: Former  Building services engineer Use: Never used  Substance Use Topics   Alcohol use: Never   Drug use: Never  Family Medical History: Family History  Problem Relation Age of Onset   Heart attack Mother 61   Heart disease Father    Thyroid disease Father    Skin cancer Father    Brain cancer Maternal Grandmother     Physical Examination: There were no vitals filed for this visit.  General: Patient is well developed, well nourished, calm, collected, and in no apparent distress. Attention to examination is appropriate.  Neck:   Supple.  Full range of motion.  Respiratory: Patient is breathing without any difficulty.   NEUROLOGICAL:     Awake, alert, oriented to person, place, and time.  Speech is clear and fluent. Fund of knowledge is appropriate.   Cranial Nerves: Pupils  equal round and reactive to light.  Facial tone is symmetric.  Facial sensation is symmetric. Shoulder shrug is symmetric. Tongue protrusion is midline.  There is no pronator drift.  ROM of spine: full.    Strength: Side Biceps Triceps Deltoid Interossei Grip Wrist Ext. Wrist Flex.  R 5 4+ 5 4- 4 5 5   L 5 5 5  4+ 4+ 5 5   Side Iliopsoas Quads Hamstring PF DF EHL  R 5 5 5 5 5 5   L 5 5 5 5 5 5    Reflexes are 1+ and symmetric at the biceps, triceps, brachioradialis, patella and achilles.   Hoffman's is present.   Bilateral upper and lower extremity sensation is intact to light touch.    No evidence of dysmetria noted.  Gait is slowed and wide-based.    Medical Decision Making  Imaging: MRI CL spine 09/30/21 L3-L4: Grade 1 anterolisthesis. Uncovering the disc and superimposed disc bulging. Ligamentum flavum thickening and facet arthropathy. Slight progression of severe canal stenosis. Mild bilateral foraminal stenosis.   L4-L5: Grade 1 anterolisthesis. Uncovering the disc with superimposed disc bulging. Ligamentum flavum thickening and facet arthropathy. Slight progression of severe canal stenosis. Mild bilateral foraminal stenosis.   L5-S1: Facet arthropathy without significant stenosis.   IMPRESSION: Slight progression of severe canal stenosis at L3-L4 and L4-L5.     Electronically Signed   By: M.D.   On: 09/30/2021 15:18  IMPRESSION: 1. At C4-C5, moderate to severe canal stenosis and severe bilateral foraminal stenosis. 2. At C5-C6, moderate canal stenosis and moderate bilateral foraminal stenosis. 3. At C6-C7, severe left and moderate right foraminal stenosis with mild canal stenosis.     Electronically Signed   By: M.D.   On: 09/30/2021 15:14 I have personally reviewed the images and agree with the above interpretation.  Assessment and Plan: Janet [name] is a pleasant 71 y.o. female with symptoms of cervical stenosis with  cervical myelopathy as well as lumbar stenosis and back pain due to anterolisthesis of L3-4 and L4-5.  She has tried physical therapy and injections.  She is having worsening function of her arms and legs.  Is suffering from symptoms of cervical myelopathy and has objective weakness on examination.  At this point, I do not think that further conservative management is indicated.  I recommended intervention on her cervical spine due to her severe cervical stenosis and symptoms of cervical myelopathy.  I recommended C4-7 anterior cervical discectomy and fusion.  I discussed the planned procedure at length with the patient, including the risks, benefits, alternatives, and indications. The risks discussed include but are not limited to bleeding, infection, need for reoperation, spinal fluid leak, stroke, vision loss, anesthetic complication, coma, paralysis, and even death. We also discussed  the possibility of post-operative dysphagia, vocal cord paralysis, and the risk of adjacent segment disease in the future. I also described in detail that improvement was not guaranteed.  The patient expressed understanding of these risks, and asked that we proceed with surgery. I described the surgery in layman's terms, and gave ample opportunity for questions, which were answered to the best of my ability.  She also has worsening lumbar stenosis with anterolisthesis of L3 on 4 and L4 on L5.  I think she has maximized conservative management there as well, but will need surgical intervention before lumbar intervention.   I spent a total of 30 minutes in face-to-face and non-face-to-face activities related to this patient's care today.  Thank you for involving me in the care of this patient.      Renatta Shrieves K. Megahn Killings MD, MPHS Neurosurgery  

## 2021-10-21 NOTE — Progress Notes (Signed)
Referring Physician:  Smitty Cords, DO [address] [place],  [place] [address]  Primary Physician:  Smitty Cords, DO  History of Present Illness: 10/21/2021 [name] is here today with a chief complaint of back and leg pain.  It has worsened over time.  Activity and working on her feet bother her.  She can walk 5 minutes before she has to stop due to leg weakness and discomfort in her back and legs.   She is also having pain down her arms.  She gets numbness and tingling in her hands.  She has had issues with her balance, and has had some falls.  She is walking very slowly to try to keep from falling.    06/11/2020 [name] is here today with a chief complaint of low back pain and left lateral leg pain and numbness. She reports bilateral leg weakness for the past 2 years.  She has had back trouble for several years. She previously underwent a course of conservative management several years ago, and it helped her significantly. She now has worsening symptoms and has significant trouble with pain down her left leg when she walks or stands. She has to walk very slowly, and can only walk approximately 15 minutes. Her pain gets worse as the day goes on and she ultimately feels that she is bent forward.  Bowel/Bladder Dysfunction: none  Conservative measures:  Physical therapy: has not participated Multimodal medical therapy including regular antiinflammatories: baclofen, gabapentin,  Injections: has received epidural steroid injections about 3 years ago in [place], [place]  Past Surgery: none  Janet [name] has no symptoms of cervical myelopathy.  The symptoms are causing a significant impact on the patient's life.   Review of Systems:  A 10 point review of systems is negative, except for the pertinent positives and negatives detailed in the HPI.  Past Medical History: Past Medical History:  Diagnosis Date   GERD (gastroesophageal reflux disease)     Heart murmur    Hyperlipidemia    Hypertension    PAD (peripheral artery disease) (HCC)    Restless leg    Sleep apnea    Urinary incontinence     Past Surgical History: Past Surgical History:  Procedure Laterality Date   APPENDECTOMY  2014   CARPAL TUNNEL RELEASE Left 1998   DG C-ARM 2 VIEW LEFT SHOULDER ([facility] HX)     DG C-ARM 2 VIEW RIGHT SHOULDER ([facility] HX)     ESOPHAGEAL DILATION  2012   2015, 2017, 2020   GASTRIC BYPASS  2019   HERNIA REPAIR  2019   left knee manipulation Left 02/05/2019   PACEMAKER IMPLANT  2017   POLYPECTOMY     SHOULDER SURGERY Left 2014   SHOULDER SURGERY Right 2017   TOTAL KNEE ARTHROPLASTY Right 2018   TOTAL KNEE ARTHROPLASTY Left 10/18/2018   vein removal Bilateral 2012    Allergies: Allergies as of 10/21/2021   (No Known Allergies)    Medications: No outpatient medications have been marked as taking for the 10/21/21 encounter (Office Visit) with [name], MD.    Social History: Social History   Tobacco Use   Smoking status: Former    Packs/day: 1.00    Years: 25.00    Total pack years: 25.00    Types: Cigarettes   Smokeless tobacco: Former  Building services engineer Use: Never used  Substance Use Topics   Alcohol use: Never   Drug use: Never  Family Medical History: Family History  Problem Relation Age of Onset   Heart attack Mother 61   Heart disease Father    Thyroid disease Father    Skin cancer Father    Brain cancer Maternal Grandmother     Physical Examination: There were no vitals filed for this visit.  General: Patient is well developed, well nourished, calm, collected, and in no apparent distress. Attention to examination is appropriate.  Neck:   Supple.  Full range of motion.  Respiratory: Patient is breathing without any difficulty.   NEUROLOGICAL:     Awake, alert, oriented to person, place, and time.  Speech is clear and fluent. Fund of knowledge is appropriate.   Cranial Nerves: Pupils  equal round and reactive to light.  Facial tone is symmetric.  Facial sensation is symmetric. Shoulder shrug is symmetric. Tongue protrusion is midline.  There is no pronator drift.  ROM of spine: full.    Strength: Side Biceps Triceps Deltoid Interossei Grip Wrist Ext. Wrist Flex.  R 5 4+ 5 4- 4 5 5   L 5 5 5  4+ 4+ 5 5   Side Iliopsoas Quads Hamstring PF DF EHL  R 5 5 5 5 5 5   L 5 5 5 5 5 5    Reflexes are 1+ and symmetric at the biceps, triceps, brachioradialis, patella and achilles.   Hoffman's is present.   Bilateral upper and lower extremity sensation is intact to light touch.    No evidence of dysmetria noted.  Gait is slowed and wide-based.    Medical Decision Making  Imaging: MRI CL spine 09/30/21 L3-L4: Grade 1 anterolisthesis. Uncovering the disc and superimposed disc bulging. Ligamentum flavum thickening and facet arthropathy. Slight progression of severe canal stenosis. Mild bilateral foraminal stenosis.   L4-L5: Grade 1 anterolisthesis. Uncovering the disc with superimposed disc bulging. Ligamentum flavum thickening and facet arthropathy. Slight progression of severe canal stenosis. Mild bilateral foraminal stenosis.   L5-S1: Facet arthropathy without significant stenosis.   IMPRESSION: Slight progression of severe canal stenosis at L3-L4 and L4-L5.     Electronically Signed   By: M.D.   On: 09/30/2021 15:18  IMPRESSION: 1. At C4-C5, moderate to severe canal stenosis and severe bilateral foraminal stenosis. 2. At C5-C6, moderate canal stenosis and moderate bilateral foraminal stenosis. 3. At C6-C7, severe left and moderate right foraminal stenosis with mild canal stenosis.     Electronically Signed   By: M.D.   On: 09/30/2021 15:14 I have personally reviewed the images and agree with the above interpretation.  Assessment and Plan: Janet [name] is a pleasant 71 y.o. female with symptoms of cervical stenosis with  cervical myelopathy as well as lumbar stenosis and back pain due to anterolisthesis of L3-4 and L4-5.  She has tried physical therapy and injections.  She is having worsening function of her arms and legs.  Is suffering from symptoms of cervical myelopathy and has objective weakness on examination.  At this point, I do not think that further conservative management is indicated.  I recommended intervention on her cervical spine due to her severe cervical stenosis and symptoms of cervical myelopathy.  I recommended C4-7 anterior cervical discectomy and fusion.  I discussed the planned procedure at length with the patient, including the risks, benefits, alternatives, and indications. The risks discussed include but are not limited to bleeding, infection, need for reoperation, spinal fluid leak, stroke, vision loss, anesthetic complication, coma, paralysis, and even death. We also discussed  the possibility of post-operative dysphagia, vocal cord paralysis, and the risk of adjacent segment disease in the future. I also described in detail that improvement was not guaranteed.  The patient expressed understanding of these risks, and asked that we proceed with surgery. I described the surgery in layman's terms, and gave ample opportunity for questions, which were answered to the best of my ability.  She also has worsening lumbar stenosis with anterolisthesis of L3 on 4 and L4 on L5.  I think she has maximized conservative management there as well, but will need surgical intervention before lumbar intervention.   I spent a total of 30 minutes in face-to-face and non-face-to-face activities related to this patient's care today.  Thank you for involving me in the care of this patient.      Kareem Cathey K. Myer Haff MD, [facility] Neurosurgery

## 2021-10-21 NOTE — Patient Instructions (Signed)
Please see below for information in regards to your upcoming surgery:  Planned surgery: C4-7 anterior cervical discectomy and fusion   Surgery date: 11/03/21 - you will find out your arrival time the business day before your surgery.   Blood thinners: ok to stay on aspirin 81mg     NSAIDS (Non-steroidal anti-inflammatory drugs): because you are having a fusion, no NSAIDS (such as ibuprofen, aleve, naproxen, meloxicam, diclofenac) for 3 months after surgery. Celebrex is an exception. Tylenol is ok because it is not an NSAID.   Pre-op appointment at [facility] Pre-admit Testing: we will call you with a date/time for this. Pre-admit testing is located on the first floor of the [facility], [address] [facility] [address], [address].   Pre-op labs may be done at your pre-op appointment. You are not required to fast for these labs.    Should you need to change your pre-op appointment, please call Pre-admit testing at [phone].    Home health physical therapy: [phone] (formerly [facility] will contact you regarding home health physical therapy for after surgery.Their number is [phone].   Because you are having a fusion: for appointments after your 2 week follow-up: please arrive at the [facility] outpatient imaging center ([address], [address], [address]) or [employer] one hour prior to your appointment for x-rays. This applies to every appointment after your 2 week follow-up. Failure to do so may result in your appointment being rescheduled.   If you have FMLA/disability paperwork, please drop it off or fax it to [phone], attention Patty.   If you have any questions/concerns before or after surgery, you can reach [phone] at [phone], or you can send a mychart message. If you have a concern after hours that cannot wait until normal business hours, you can call [phone] or [phone] and ask the answering service to page  the neurosurgeon on call.   Appointments/FMLA & disability paperwork: Patty Nurse: [phone]  Medical assistant: Royston Cowper Physician Assistant's: Irving Burton & Manning Charity Surgeon: Drake Leach, MD

## 2021-10-25 ENCOUNTER — Ambulatory Visit
Admission: RE | Admit: 2021-10-25 | Discharge: 2021-10-25 | Disposition: A | Discharge status: Home / Self Care | Payer: Medicare Other | Source: Ambulatory Visit | Attending: Family Medicine | Admitting: Family Medicine

## 2021-10-25 DIAGNOSIS — M79605 Pain in left leg: Secondary | ICD-10-CM | POA: Diagnosis not present

## 2021-10-25 DIAGNOSIS — R29898 Other symptoms and signs involving the musculoskeletal system: Secondary | ICD-10-CM | POA: Diagnosis not present

## 2021-10-25 DIAGNOSIS — R200 Anesthesia of skin: Secondary | ICD-10-CM | POA: Diagnosis not present

## 2021-10-25 DIAGNOSIS — G629 Polyneuropathy, unspecified: Secondary | ICD-10-CM | POA: Diagnosis not present

## 2021-10-25 DIAGNOSIS — G2581 Restless legs syndrome: Secondary | ICD-10-CM | POA: Diagnosis not present

## 2021-10-25 DIAGNOSIS — Z1231 Encounter for screening mammogram for malignant neoplasm of breast: Secondary | ICD-10-CM | POA: Insufficient documentation

## 2021-10-25 DIAGNOSIS — M79604 Pain in right leg: Secondary | ICD-10-CM | POA: Diagnosis not present

## 2021-10-25 DIAGNOSIS — R202 Paresthesia of skin: Secondary | ICD-10-CM | POA: Diagnosis not present

## 2021-10-25 DIAGNOSIS — M48061 Spinal stenosis, lumbar region without neurogenic claudication: Secondary | ICD-10-CM | POA: Diagnosis not present

## 2021-10-26 ENCOUNTER — Other Ambulatory Visit: Payer: Self-pay | Admitting: *Deleted

## 2021-10-26 ENCOUNTER — Ambulatory Visit
Admission: RE | Admit: 2021-10-26 | Discharge: 2021-10-26 | Disposition: A | Discharge status: Home / Self Care | Payer: Self-pay | Source: Ambulatory Visit | Attending: *Deleted | Admitting: *Deleted

## 2021-10-26 ENCOUNTER — Telehealth: Payer: Medicare Other

## 2021-10-26 ENCOUNTER — Ambulatory Visit: Payer: Medicare Other

## 2021-10-26 ENCOUNTER — Encounter
Admission: RE | Admit: 2021-10-26 | Discharge: 2021-10-26 | Disposition: A | Discharge status: Home / Self Care | Payer: Medicare Other | Source: Ambulatory Visit | Attending: Neurosurgery | Admitting: Neurosurgery

## 2021-10-26 DIAGNOSIS — Z1231 Encounter for screening mammogram for malignant neoplasm of breast: Secondary | ICD-10-CM

## 2021-10-26 DIAGNOSIS — Z01818 Encounter for other preprocedural examination: Secondary | ICD-10-CM | POA: Insufficient documentation

## 2021-10-26 DIAGNOSIS — Z950 Presence of cardiac pacemaker: Secondary | ICD-10-CM | POA: Insufficient documentation

## 2021-10-26 DIAGNOSIS — E1169 Type 2 diabetes mellitus with other specified complication: Secondary | ICD-10-CM | POA: Diagnosis not present

## 2021-10-26 DIAGNOSIS — Z01810 Encounter for preprocedural cardiovascular examination: Secondary | ICD-10-CM | POA: Diagnosis not present

## 2021-10-26 DIAGNOSIS — I6522 Occlusion and stenosis of left carotid artery: Secondary | ICD-10-CM | POA: Insufficient documentation

## 2021-10-26 DIAGNOSIS — R600 Localized edema: Secondary | ICD-10-CM

## 2021-10-26 DIAGNOSIS — Z01812 Encounter for preprocedural laboratory examination: Secondary | ICD-10-CM

## 2021-10-26 LAB — TYPE AND SCREEN
ABO/RH(D): A POS
Antibody Screen: NEGATIVE

## 2021-10-26 LAB — BASIC METABOLIC PANEL
Anion gap: 7 (ref 5–15)
BUN: 16 mg/dL (ref 8–23)
CO2: 29 mmol/L (ref 22–32)
Calcium: 9.1 mg/dL (ref 8.9–10.3)
Chloride: 104 mmol/L (ref 98–111)
Creatinine, Ser: 0.6 mg/dL (ref 0.44–1.00)
GFR, Estimated: >60 mL/min (ref >60)
Glucose, Bld: 94 mg/dL (ref 70–99)
Potassium: 3.8 mmol/L (ref 3.5–5.1)
Sodium: 140 mmol/L (ref 135–145)

## 2021-10-26 LAB — SURGICAL PCR SCREEN
MRSA, PCR: NEGATIVE
Staphylococcus aureus: NEGATIVE

## 2021-10-26 LAB — URINALYSIS, ROUTINE W REFLEX MICROSCOPIC
Bilirubin Urine: NEGATIVE
Glucose, UA: NEGATIVE mg/dL
Hgb urine dipstick: NEGATIVE
Ketones, ur: NEGATIVE mg/dL
Leukocytes,Ua: NEGATIVE
Nitrite: NEGATIVE
Protein, ur: NEGATIVE mg/dL
Specific Gravity, Urine: 1.012 (ref 1.005–1.030)
pH: 6 (ref 5.0–8.0)

## 2021-10-26 NOTE — Patient Instructions (Addendum)
Your procedure is scheduled on: Wednesday November 03, 2021. Report to [facility] inside [facility] 2nd floor, stop by admissions desk before getting on elevator. To find out your arrival time please call [phone] between 1PM - 3PM on Tuesday November 02, 2021.  Remember: Instructions that are not followed completely may result in serious medical risk,  up to and including death, or upon the discretion of your surgeon and anesthesiologist your  surgery may need to be rescheduled.     _X__ 1. Do not eat food after midnight the night before your procedure.                 No chewing gum or hard candies. You may drink clear liquids up to 2 hours                 before you are scheduled to arrive for your surgery- DO not drink clear                 liquids within 2 hours of the start of your surgery.                 Clear Liquids include:  water, apple juice without pulp, clear Gatorade, G2 or                  Gatorade Zero (avoid Red/Purple/Blue), Black Coffee or Tea (Do not add                 anything to coffee or tea).  __X__2.  On the morning of surgery brush your teeth with toothpaste and water, you                may rinse your mouth with mouthwash if you wish.  Do not swallow any toothpaste or mouthwash.     _X__ 3.  No Alcohol for 24 hours before or after surgery.   _X__ 4.  Do Not Smoke or use e-cigarettes For 24 Hours Prior to Your Surgery.                 Do not use any chewable tobacco products for at least 6 hours prior to                 Surgery.  _X__  5.  Do not use any recreational drugs (marijuana, cocaine, heroin, ecstasy, MDMA or other)                For at least one week prior to your surgery.  Combination of these drugs with anesthesia                May have life threatening results.  ____  6.  Bring all medications with you on the day of surgery if instructed.   __X__  7.  Notify your doctor if there is any change in your medical  condition      (cold, fever, infections).     Do not wear jewelry, make-up, hairpins, clips or nail polish. Do not wear lotions, powders, or perfumes. You may wear deodorant. Do not shave 48 hours prior to surgery. Men may shave face and neck. Do not bring valuables to the hospital.    [facility] is not responsible for any belongings or valuables.  Contacts, dentures or bridgework may not be worn into surgery. Leave your suitcase in the car. After surgery it may be brought to your room. For patients admitted to the hospital, discharge time is determined  by your treatment team.   Patients discharged the day of surgery will not be allowed to drive home.   Make arrangements for someone to be with you for the first 24 hours of your Same Day Discharge.   __X__ Take these medicines the morning of surgery with A SIP OF WATER:    1. clonazePAM (KLONOPIN) 0.5 MG  2. pramipexole (MIRAPEX) 1 MG  3.    ____ Fleet Enema (as directed)   __X__ Use CHG Soap (or wipes) as directed  ____ Use Benzoyl Peroxide Gel as instructed  ____ Use inhalers on the day of surgery  ____ Stop metformin 2 days prior to surgery    ____ Take 1/2 of usual insulin dose the night before surgery. No insulin the morning          of surgery.   ____ Call your PCP, cardiologist, or Pulmonologist if taking Coumadin/Plavix/aspirin and ask when to stop before your surgery.   __X__ One Week prior to surgery- Stop Anti-inflammatories such as Ibuprofen, Aleve, Advil, Motrin, meloxicam (MOBIC), diclofenac, etodolac, ketorolac, Toradol, Daypro, piroxicam, Goody's or BC powders. OK TO USE TYLENOL IF NEEDED   __X__ Stop supplements until after surgery.    ____ Bring C-Pap to the hospital.    If you have any questions regarding your pre-procedure instructions,  Please call Pre-admit Testing at [phone]

## 2021-10-28 ENCOUNTER — Encounter: Payer: Self-pay | Admitting: Neurosurgery

## 2021-11-03 ENCOUNTER — Telehealth: Payer: Medicare Other

## 2021-11-03 ENCOUNTER — Inpatient Hospital Stay: Payer: Medicare Other

## 2021-11-03 ENCOUNTER — Inpatient Hospital Stay
Admission: RE | Admit: 2021-11-03 | Discharge: 2021-11-04 | DRG: 473 | Disposition: A | Discharge status: Home Health | Payer: Medicare Other | Attending: Neurosurgery | Admitting: Neurosurgery

## 2021-11-03 ENCOUNTER — Other Ambulatory Visit: Payer: Self-pay

## 2021-11-03 ENCOUNTER — Inpatient Hospital Stay: Payer: Medicare Other | Admitting: Urgent Care

## 2021-11-03 ENCOUNTER — Encounter: Payer: Self-pay | Admitting: Neurosurgery

## 2021-11-03 ENCOUNTER — Encounter
Admission: RE | Disposition: A | Discharge status: Home Health | Payer: Self-pay | Source: Home / Self Care | Attending: Neurosurgery

## 2021-11-03 DIAGNOSIS — G4730 Sleep apnea, unspecified: Secondary | ICD-10-CM | POA: Diagnosis present

## 2021-11-03 DIAGNOSIS — Z01818 Encounter for other preprocedural examination: Secondary | ICD-10-CM

## 2021-11-03 DIAGNOSIS — M4316 Spondylolisthesis, lumbar region: Secondary | ICD-10-CM | POA: Diagnosis not present

## 2021-11-03 DIAGNOSIS — Z96653 Presence of artificial knee joint, bilateral: Secondary | ICD-10-CM | POA: Diagnosis present

## 2021-11-03 DIAGNOSIS — I10 Essential (primary) hypertension: Secondary | ICD-10-CM | POA: Diagnosis not present

## 2021-11-03 DIAGNOSIS — R011 Cardiac murmur, unspecified: Secondary | ICD-10-CM | POA: Diagnosis present

## 2021-11-03 DIAGNOSIS — Z9884 Bariatric surgery status: Secondary | ICD-10-CM | POA: Diagnosis not present

## 2021-11-03 DIAGNOSIS — Z8249 Family history of ischemic heart disease and other diseases of the circulatory system: Secondary | ICD-10-CM

## 2021-11-03 DIAGNOSIS — M4012 Other secondary kyphosis, cervical region: Secondary | ICD-10-CM | POA: Diagnosis not present

## 2021-11-03 DIAGNOSIS — Z87891 Personal history of nicotine dependence: Secondary | ICD-10-CM | POA: Diagnosis not present

## 2021-11-03 DIAGNOSIS — M4712 Other spondylosis with myelopathy, cervical region: Principal | ICD-10-CM | POA: Diagnosis present

## 2021-11-03 DIAGNOSIS — Z9049 Acquired absence of other specified parts of digestive tract: Secondary | ICD-10-CM | POA: Diagnosis not present

## 2021-11-03 DIAGNOSIS — M4322 Fusion of spine, cervical region: Secondary | ICD-10-CM | POA: Diagnosis not present

## 2021-11-03 DIAGNOSIS — G2581 Restless legs syndrome: Secondary | ICD-10-CM | POA: Diagnosis not present

## 2021-11-03 DIAGNOSIS — M48061 Spinal stenosis, lumbar region without neurogenic claudication: Secondary | ICD-10-CM | POA: Diagnosis not present

## 2021-11-03 DIAGNOSIS — E1169 Type 2 diabetes mellitus with other specified complication: Secondary | ICD-10-CM

## 2021-11-03 DIAGNOSIS — M545 Low back pain, unspecified: Secondary | ICD-10-CM | POA: Diagnosis present

## 2021-11-03 DIAGNOSIS — E785 Hyperlipidemia, unspecified: Secondary | ICD-10-CM | POA: Diagnosis not present

## 2021-11-03 DIAGNOSIS — M4802 Spinal stenosis, cervical region: Secondary | ICD-10-CM | POA: Diagnosis not present

## 2021-11-03 DIAGNOSIS — G959 Disease of spinal cord, unspecified: Secondary | ICD-10-CM

## 2021-11-03 DIAGNOSIS — K219 Gastro-esophageal reflux disease without esophagitis: Secondary | ICD-10-CM | POA: Diagnosis not present

## 2021-11-03 DIAGNOSIS — I739 Peripheral vascular disease, unspecified: Secondary | ICD-10-CM | POA: Diagnosis present

## 2021-11-03 HISTORY — DX: Restless legs syndrome: G25.81

## 2021-11-03 HISTORY — DX: Type 2 diabetes mellitus without complications: E11.9

## 2021-11-03 HISTORY — DX: Anxiety disorder, unspecified: F41.9

## 2021-11-03 HISTORY — DX: Venous insufficiency (chronic) (peripheral): I87.2

## 2021-11-03 HISTORY — DX: Insomnia, unspecified: G47.00

## 2021-11-03 HISTORY — PX: ANTERIOR CERVICAL DECOMP/DISCECTOMY FUSION: SHX1161

## 2021-11-03 HISTORY — DX: Unspecified osteoarthritis, unspecified site: M19.90

## 2021-11-03 HISTORY — DX: Depression, unspecified: F32.A

## 2021-11-03 HISTORY — DX: Other secondary kyphosis, cervical region: M40.12

## 2021-11-03 HISTORY — DX: Esophageal obstruction: K22.2

## 2021-11-03 HISTORY — DX: Spinal stenosis, cervical region: M48.02

## 2021-11-03 HISTORY — DX: Other ill-defined heart diseases: I51.89

## 2021-11-03 HISTORY — DX: Gastro-esophageal reflux disease without esophagitis: K21.9

## 2021-11-03 HISTORY — DX: Shortness of breath: R06.02

## 2021-11-03 HISTORY — DX: Age-related osteoporosis without current pathological fracture: M81.0

## 2021-11-03 LAB — GLUCOSE, CAPILLARY
Glucose-Capillary: 93 mg/dL (ref 70–99)
Glucose-Capillary: 128 mg/dL — ABNORMAL HIGH (ref 70–99)

## 2021-11-03 LAB — TROPONIN I (HIGH SENSITIVITY)
Troponin I (High Sensitivity): 4 ng/L (ref <18)
Troponin I (High Sensitivity): 5 ng/L (ref <18)

## 2021-11-03 LAB — ABO/RH: ABO/RH(D): A POS

## 2021-11-03 SURGERY — ANTERIOR CERVICAL DECOMPRESSION/DISCECTOMY FUSION 3 LEVELS
Anesthesia: General | Site: Spine Cervical

## 2021-11-03 MED ORDER — SODIUM CHLORIDE 0.9 % IV SOLN: INTRAVENOUS | Status: DC | PRN

## 2021-11-03 MED ORDER — PHENYLEPHRINE HCL (PRESSORS) 10 MG/ML IV SOLN
INTRAVENOUS | Status: DC | PRN
  Administered 2021-11-03 (×5): 80 ug via INTRAVENOUS

## 2021-11-03 MED ORDER — ONDANSETRON HCL 4 MG PO TABS: 4.0000 mg | ORAL_TABLET | Freq: Four times a day (QID) | ORAL | Status: DC | PRN

## 2021-11-03 MED ORDER — REMIFENTANIL HCL 1 MG IV SOLR
INTRAVENOUS | Status: AC
  Filled 2021-11-03: qty 1000

## 2021-11-03 MED ORDER — FENTANYL CITRATE (PF) 100 MCG/2ML IJ SOLN
INTRAMUSCULAR | Status: DC | PRN
  Administered 2021-11-03: 50 ug via INTRAVENOUS
  Administered 2021-11-03 (×2): 25 ug via INTRAVENOUS

## 2021-11-03 MED ORDER — HYDROCHLOROTHIAZIDE 25 MG PO TABS
25.0000 mg | ORAL_TABLET | Freq: Every day | ORAL | Status: DC
  Administered 2021-11-03 – 2021-11-04 (×2): 25 mg via ORAL
  Filled 2021-11-03 (×2): qty 1

## 2021-11-03 MED ORDER — ONDANSETRON HCL 4 MG/2ML IJ SOLN
INTRAMUSCULAR | Status: AC
  Filled 2021-11-03: qty 2

## 2021-11-03 MED ORDER — PROPOFOL 1000 MG/100ML IV EMUL
INTRAVENOUS | Status: AC
  Filled 2021-11-03: qty 100

## 2021-11-03 MED ORDER — SODIUM CHLORIDE 0.9 % IV SOLN: 250.0000 mL | INTRAVENOUS | Status: DC

## 2021-11-03 MED ORDER — METHOCARBAMOL 500 MG PO TABS
500.0000 mg | ORAL_TABLET | Freq: Four times a day (QID) | ORAL | Status: DC | PRN
  Administered 2021-11-03: 500 mg via ORAL
  Filled 2021-11-03: qty 1

## 2021-11-03 MED ORDER — SODIUM CHLORIDE 0.9% FLUSH
3.0000 mL | INTRAVENOUS | Status: DC | PRN
  Administered 2021-11-04: 3 mL via INTRAVENOUS

## 2021-11-03 MED ORDER — ACETAMINOPHEN 10 MG/ML IV SOLN
INTRAVENOUS | Status: DC | PRN
  Administered 2021-11-03: 1000 mg via INTRAVENOUS

## 2021-11-03 MED ORDER — ONDANSETRON HCL 4 MG/2ML IJ SOLN: 4.0000 mg | Freq: Four times a day (QID) | INTRAMUSCULAR | Status: DC | PRN

## 2021-11-03 MED ORDER — CLONAZEPAM 0.25 MG PO TBDP: 0.2500 mg | ORAL_TABLET | Freq: Two times a day (BID) | ORAL | Status: DC | PRN

## 2021-11-03 MED ORDER — BUPIVACAINE-EPINEPHRINE 0.5% -1:200000 IJ SOLN
INTRAMUSCULAR | Status: DC | PRN
  Administered 2021-11-03: 9 mL

## 2021-11-03 MED ORDER — PROPOFOL 10 MG/ML IV BOLUS
INTRAVENOUS | Status: AC
  Filled 2021-11-03: qty 20

## 2021-11-03 MED ORDER — ACETAMINOPHEN 650 MG RE SUPP: 650.0000 mg | RECTAL | Status: DC | PRN

## 2021-11-03 MED ORDER — CHLORHEXIDINE GLUCONATE 0.12 % MT SOLN
OROMUCOSAL | Status: AC
  Administered 2021-11-03: 15 mL via OROMUCOSAL
  Filled 2021-11-03: qty 15

## 2021-11-03 MED ORDER — CEFAZOLIN SODIUM-DEXTROSE 2-4 GM/100ML-% IV SOLN
2.0000 g | Freq: Once | INTRAVENOUS | Status: AC
  Administered 2021-11-03: 2 g via INTRAVENOUS

## 2021-11-03 MED ORDER — BUPIVACAINE-EPINEPHRINE (PF) 0.5% -1:200000 IJ SOLN
INTRAMUSCULAR | Status: AC
  Filled 2021-11-03: qty 30

## 2021-11-03 MED ORDER — ATORVASTATIN CALCIUM 20 MG PO TABS
40.0000 mg | ORAL_TABLET | Freq: Every day | ORAL | Status: DC
  Administered 2021-11-03: 40 mg via ORAL
  Filled 2021-11-03: qty 2

## 2021-11-03 MED ORDER — SODIUM CHLORIDE 0.9 % IV SOLN: INTRAVENOUS | Status: DC

## 2021-11-03 MED ORDER — SODIUM CHLORIDE 0.9% FLUSH
3.0000 mL | Freq: Two times a day (BID) | INTRAVENOUS | Status: DC
  Administered 2021-11-03 – 2021-11-04 (×2): 3 mL via INTRAVENOUS

## 2021-11-03 MED ORDER — FENTANYL CITRATE (PF) 100 MCG/2ML IJ SOLN
25.0000 ug | INTRAMUSCULAR | Status: DC | PRN
  Administered 2021-11-03 (×2): 25 ug via INTRAVENOUS

## 2021-11-03 MED ORDER — ACETAMINOPHEN 325 MG PO TABS: 650.0000 mg | ORAL_TABLET | ORAL | Status: DC | PRN

## 2021-11-03 MED ORDER — METHOCARBAMOL 1000 MG/10ML IJ SOLN
500.0000 mg | Freq: Four times a day (QID) | INTRAVENOUS | Status: DC | PRN
  Filled 2021-11-03 (×2): qty 5

## 2021-11-03 MED ORDER — LOSARTAN POTASSIUM-HCTZ 100-25 MG PO TABS: 1.0000 | ORAL_TABLET | Freq: Every day | ORAL | Status: DC

## 2021-11-03 MED ORDER — PROPOFOL 10 MG/ML IV BOLUS
INTRAVENOUS | Status: DC | PRN
  Administered 2021-11-03: 125 ug/kg/min via INTRAVENOUS
  Administered 2021-11-03: 120 mg via INTRAVENOUS

## 2021-11-03 MED ORDER — HYDRALAZINE HCL 20 MG/ML IJ SOLN
10.0000 mg | Freq: Once | INTRAMUSCULAR | Status: AC | PRN
  Administered 2021-11-03: 10 mg via INTRAVENOUS

## 2021-11-03 MED ORDER — PHENOL 1.4 % MT LIQD: 1.0000 | OROMUCOSAL | Status: DC | PRN

## 2021-11-03 MED ORDER — PHENYLEPHRINE HCL-NACL 20-0.9 MG/250ML-% IV SOLN
INTRAVENOUS | Status: DC | PRN
  Administered 2021-11-03: 80 ug/min via INTRAVENOUS

## 2021-11-03 MED ORDER — OXYCODONE HCL 5 MG PO TABS
5.0000 mg | ORAL_TABLET | ORAL | Status: DC | PRN
  Administered 2021-11-03 – 2021-11-04 (×3): 5 mg via ORAL
  Filled 2021-11-03 (×3): qty 1

## 2021-11-03 MED ORDER — SUCCINYLCHOLINE CHLORIDE 200 MG/10ML IV SOSY
PREFILLED_SYRINGE | INTRAVENOUS | Status: DC | PRN
  Administered 2021-11-03: 120 mg via INTRAVENOUS

## 2021-11-03 MED ORDER — ACETAMINOPHEN 10 MG/ML IV SOLN
INTRAVENOUS | Status: AC
  Filled 2021-11-03: qty 100

## 2021-11-03 MED ORDER — 0.9 % SODIUM CHLORIDE (POUR BTL) OPTIME
TOPICAL | Status: DC | PRN
  Administered 2021-11-03: 500 mL

## 2021-11-03 MED ORDER — GLYCOPYRROLATE 0.2 MG/ML IJ SOLN
INTRAMUSCULAR | Status: AC
  Filled 2021-11-03: qty 1

## 2021-11-03 MED ORDER — ONDANSETRON HCL 4 MG/2ML IJ SOLN
INTRAMUSCULAR | Status: DC | PRN
  Administered 2021-11-03: 4 mg via INTRAVENOUS

## 2021-11-03 MED ORDER — MENTHOL 3 MG MT LOZG
1.0000 | LOZENGE | OROMUCOSAL | Status: DC | PRN
Start: 2021-11-03 — End: 2021-11-04

## 2021-11-03 MED ORDER — MORPHINE SULFATE (PF) 2 MG/ML IV SOLN
2.0000 mg | INTRAVENOUS | Status: DC | PRN
  Administered 2021-11-03 (×2): 2 mg via INTRAVENOUS
  Filled 2021-11-03 (×3): qty 1

## 2021-11-03 MED ORDER — DEXAMETHASONE SODIUM PHOSPHATE 10 MG/ML IJ SOLN
INTRAMUSCULAR | Status: AC
  Filled 2021-11-03: qty 1

## 2021-11-03 MED ORDER — LOSARTAN POTASSIUM 50 MG PO TABS
100.0000 mg | ORAL_TABLET | Freq: Every day | ORAL | Status: DC
  Administered 2021-11-03 – 2021-11-04 (×2): 100 mg via ORAL
  Filled 2021-11-03 (×2): qty 2

## 2021-11-03 MED ORDER — LIDOCAINE HCL (CARDIAC) PF 100 MG/5ML IV SOSY
PREFILLED_SYRINGE | INTRAVENOUS | Status: DC | PRN
  Administered 2021-11-03: 100 mg via INTRAVENOUS

## 2021-11-03 MED ORDER — CHLORHEXIDINE GLUCONATE 0.12 % MT SOLN: 15.0000 mL | Freq: Once | OROMUCOSAL | Status: AC

## 2021-11-03 MED ORDER — GLYCOPYRROLATE 0.2 MG/ML IJ SOLN
INTRAMUSCULAR | Status: DC | PRN
  Administered 2021-11-03: .1 mg via INTRAVENOUS

## 2021-11-03 MED ORDER — PHENYLEPHRINE HCL-NACL 20-0.9 MG/250ML-% IV SOLN
INTRAVENOUS | Status: AC
  Filled 2021-11-03: qty 250

## 2021-11-03 MED ORDER — SURGIFLO WITH THROMBIN (HEMOSTATIC MATRIX KIT) OPTIME
TOPICAL | Status: DC | PRN
  Administered 2021-11-03: 1 via TOPICAL

## 2021-11-03 MED ORDER — SUCCINYLCHOLINE CHLORIDE 200 MG/10ML IV SOSY
PREFILLED_SYRINGE | INTRAVENOUS | Status: AC
  Filled 2021-11-03: qty 10

## 2021-11-03 MED ORDER — CEFAZOLIN SODIUM-DEXTROSE 2-4 GM/100ML-% IV SOLN
INTRAVENOUS | Status: AC
  Filled 2021-11-03: qty 100

## 2021-11-03 MED ORDER — ORAL CARE MOUTH RINSE
15.0000 mL | Freq: Once | OROMUCOSAL | Status: AC
Start: 2021-11-03 — End: 2021-11-03

## 2021-11-03 MED ORDER — ONDANSETRON HCL 4 MG/2ML IJ SOLN: 4.0000 mg | Freq: Once | INTRAMUSCULAR | Status: DC | PRN

## 2021-11-03 MED ORDER — HYDRALAZINE HCL 20 MG/ML IJ SOLN
INTRAMUSCULAR | Status: AC
  Filled 2021-11-03: qty 1

## 2021-11-03 MED ORDER — GABAPENTIN 300 MG PO CAPS
300.0000 mg | ORAL_CAPSULE | Freq: Three times a day (TID) | ORAL | Status: DC
  Administered 2021-11-03 – 2021-11-04 (×3): 300 mg via ORAL
  Filled 2021-11-03 (×3): qty 1

## 2021-11-03 MED ORDER — DULOXETINE HCL 30 MG PO CPEP
30.0000 mg | ORAL_CAPSULE | Freq: Every day | ORAL | Status: DC
  Administered 2021-11-03 – 2021-11-04 (×2): 30 mg via ORAL
  Filled 2021-11-03 (×2): qty 1

## 2021-11-03 MED ORDER — LIDOCAINE HCL (PF) 2 % IJ SOLN
INTRAMUSCULAR | Status: AC
  Filled 2021-11-03: qty 5

## 2021-11-03 MED ORDER — FENTANYL CITRATE (PF) 100 MCG/2ML IJ SOLN
INTRAMUSCULAR | Status: AC
  Filled 2021-11-03: qty 2

## 2021-11-03 MED ORDER — SENNA 8.6 MG PO TABS
1.0000 | ORAL_TABLET | Freq: Two times a day (BID) | ORAL | Status: DC
  Administered 2021-11-03 – 2021-11-04 (×2): 8.6 mg via ORAL
  Filled 2021-11-03 (×2): qty 1

## 2021-11-03 MED ORDER — DEXAMETHASONE SODIUM PHOSPHATE 10 MG/ML IJ SOLN
INTRAMUSCULAR | Status: DC | PRN
  Administered 2021-11-03: 10 mg via INTRAVENOUS

## 2021-11-03 MED ORDER — PRAMIPEXOLE DIHYDROCHLORIDE 1 MG PO TABS
1.0000 mg | ORAL_TABLET | Freq: Two times a day (BID) | ORAL | Status: DC
  Administered 2021-11-03 – 2021-11-04 (×2): 1 mg via ORAL
  Filled 2021-11-03 (×2): qty 1

## 2021-11-03 MED ORDER — NORTRIPTYLINE HCL 10 MG PO CAPS
20.0000 mg | ORAL_CAPSULE | Freq: Every day | ORAL | Status: DC
  Administered 2021-11-03: 20 mg via ORAL
  Filled 2021-11-03: qty 2

## 2021-11-03 MED ORDER — FENTANYL CITRATE (PF) 100 MCG/2ML IJ SOLN
INTRAMUSCULAR | Status: AC
  Administered 2021-11-03: 25 ug via INTRAVENOUS
  Filled 2021-11-03: qty 2

## 2021-11-03 MED ORDER — REMIFENTANIL HCL 1 MG IV SOLR
INTRAVENOUS | Status: DC | PRN
  Administered 2021-11-03: .08 ug/kg/min via INTRAVENOUS

## 2021-11-03 SURGICAL SUPPLY — 48 items
BASIN KIT SINGLE STR (MISCELLANEOUS) ×1 IMPLANT
BULB RESERV EVAC DRAIN JP 100C (MISCELLANEOUS) IMPLANT
BUR NEURO DRILL SOFT 3.0X3.8M (BURR) ×1 IMPLANT
DERMABOND ADVANCED (GAUZE/BANDAGES/DRESSINGS) ×1
DERMABOND ADVANCED .7 DNX12 (GAUZE/BANDAGES/DRESSINGS) ×1 IMPLANT
DRAIN CHANNEL JP 10F RND 20C F (MISCELLANEOUS) IMPLANT
DRAPE C ARM PK CFD 31 SPINE (DRAPES) ×1 IMPLANT
DRAPE LAPAROTOMY 77X122 PED (DRAPES) ×1 IMPLANT
DRAPE MICROSCOPE SPINE 48X150 (DRAPES) ×1 IMPLANT
FEE INTRAOP CADWELL SUPPLY NCS (MISCELLANEOUS) IMPLANT
FEE INTRAOP MONITOR IMPULS NCS (MISCELLANEOUS) IMPLANT
GLOVE BIOGEL PI IND STRL 6.5 (GLOVE) ×1 IMPLANT
GLOVE BIOGEL PI IND STRL 8.5 (GLOVE) ×1 IMPLANT
GLOVE SURG SYN 6.5 ES PF (GLOVE) ×2 IMPLANT
GLOVE SURG SYN 6.5 PF PI (GLOVE) ×2 IMPLANT
GLOVE SURG SYN 8.5  E (GLOVE) ×3
GLOVE SURG SYN 8.5 E (GLOVE) ×3 IMPLANT
GLOVE SURG SYN 8.5 PF PI (GLOVE) ×3 IMPLANT
GOWN SRG LRG LVL 4 IMPRV REINF (GOWNS) ×2 IMPLANT
GOWN SRG XL LVL 3 NONREINFORCE (GOWNS) ×1 IMPLANT
GOWN STRL NON-REIN TWL XL LVL3 (GOWNS) ×2
GOWN STRL REIN LRG LVL4 (GOWNS) ×2
INTRAOP CADWELL SUPPLY FEE NCS (MISCELLANEOUS) ×1
INTRAOP DISP SUPPLY FEE NCS (MISCELLANEOUS) ×1
INTRAOP MONITOR FEE IMPULS NCS (MISCELLANEOUS) ×1
INTRAOP MONITOR FEE IMPULSE (MISCELLANEOUS) ×1
IV CATH ANGIO 14GX3.25 ORG (MISCELLANEOUS) IMPLANT
KIT TURNOVER KIT A (KITS) ×1 IMPLANT
MANIFOLD NEPTUNE II (INSTRUMENTS) ×1 IMPLANT
NS IRRIG 500ML POUR BTL (IV SOLUTION) IMPLANT
PACK LAMINECTOMY NEURO (CUSTOM PROCEDURE TRAY) ×1 IMPLANT
PAD ARMBOARD 7.5X6 YLW CONV (MISCELLANEOUS) ×2 IMPLANT
PIN CASPAR 14 (PIN) ×1 IMPLANT
PIN CASPAR 14MM (PIN) ×1
PLATE ANT CERV XTEND 3 LV 45 (Plate) IMPLANT
SCREW VAR 4.2 XD SELF DRILL 14 (Screw) IMPLANT
SCREW VAR 4.2 XD SELF DRILL 16 (Screw) IMPLANT
SCREW XTEND SELF DRILL 4.6X14 (Screw) IMPLANT
SPACER CERVICAL FRGE 12X14X7-7 (Spacer) IMPLANT
SPONGE KITTNER 5P (MISCELLANEOUS) ×2 IMPLANT
STAPLER SKIN PROX 35W (STAPLE) IMPLANT
SURGIFLO W/THROMBIN 8M KIT (HEMOSTASIS) ×1 IMPLANT
SUT DVC VLOC 90 3-0 CV23 UNDY (SUTURE) IMPLANT
SUT VIC AB 3-0 SH 8-18 (SUTURE) ×1 IMPLANT
SYR 20ML LL LF (SYRINGE) ×1 IMPLANT
TAPE CLOTH 3X10 WHT NS LF (GAUZE/BANDAGES/DRESSINGS) ×3 IMPLANT
TRAP FLUID SMOKE EVACUATOR (MISCELLANEOUS) ×1 IMPLANT
TRAY FOLEY MTR SLVR 16FR STAT (SET/KITS/TRAYS/PACK) IMPLANT

## 2021-11-03 NOTE — Anesthesia Procedure Notes (Signed)
Procedure Name: Intubation Date/Time: 11/03/2021 12:22 PM  Performed by: Morene Crocker, CRNAPre-anesthesia Checklist: Patient identified, Patient being monitored, Timeout performed, Emergency Drugs available and Suction available Patient Re-evaluated:Patient Re-evaluated prior to induction Oxygen Delivery Method: Circle system utilized Preoxygenation: Pre-oxygenation with 100% oxygen Induction Type: IV induction Ventilation: Mask ventilation without difficulty Laryngoscope Size: 3 and McGraph Grade View: Grade I Tube type: Oral Tube size: 7.0 mm Number of attempts: 1 Airway Equipment and Method: Stylet Placement Confirmation: ETT inserted through vocal cords under direct vision, positive ETCO2 and breath sounds checked- equal and bilateral Secured at: 22 cm Tube secured with: Tape Dental Injury: Teeth and Oropharynx as per pre-operative assessment

## 2021-11-03 NOTE — Transfer of Care (Signed)
Immediate Anesthesia Transfer of Care Note  Patient: Janet [name]  Procedure(s) Performed: C4-7 ANTERIOR CERVICAL DISCECTOMY AND FUSION (GLOBUS FORGE) (Spine Cervical)  Patient Location: PACU  Anesthesia Type:General  Level of Consciousness: drowsy  Airway & Oxygen Therapy: Patient Spontanous Breathing and Patient connected to face mask oxygen  Post-op Assessment: Report given to RN and Post -op Vital signs reviewed and stable  Post vital signs: Reviewed and stable  Last Vitals:  Vitals Value Taken Time  BP 172/87 11/03/21 1515  Temp 36.3 C 11/03/21 1515  Pulse 98 11/03/21 1519  Resp 20 11/03/21 1519  SpO2 99 % 11/03/21 1519  Vitals shown include unvalidated device data.  Last Pain:  Vitals:   11/03/21 1515  PainSc: Asleep         Complications: No notable events documented.

## 2021-11-03 NOTE — Anesthesia Preprocedure Evaluation (Signed)
Anesthesia Evaluation  Patient identified by MRN, date of birth, ID band Patient awake    Reviewed: Allergy & Precautions, H&P , NPO status , Patient's Chart, lab work & pertinent test results, reviewed documented beta blocker date and time   Airway Mallampati: II  TM Distance: >3 FB Neck ROM: full   Comment: Limited AO ROM but no pain on extension.  No arm weakness experienced. ja Dental  (+) Teeth Intact   Pulmonary shortness of breath and with exertion, sleep apnea , former smoker,    Pulmonary exam normal        Cardiovascular Exercise Tolerance: Poor hypertension, On Medications + Peripheral Vascular Disease  Normal cardiovascular exam+ pacemaker + Valvular Problems/Murmurs  Rhythm:regular Rate:Normal     Neuro/Psych PSYCHIATRIC DISORDERS Anxiety Depression  Neuromuscular disease    GI/Hepatic Neg liver ROS, GERD  Medicated,  Endo/Other  negative endocrine ROSdiabetes, Well Controlled  Renal/GU negative Renal ROS  negative genitourinary   Musculoskeletal   Abdominal   Peds  Hematology negative hematology ROS (+)   Anesthesia Other Findings Past Medical History: No date: Anxiety     Comment:  a.) on BZO (clonazepam) PRN No date: Cervical spinal stenosis No date: Depression No date: Diastolic dysfunction     Comment:  a.)  TTE 05/2021: EF 90%, mild LVH, trivial MR/TR/PR,               G1DD No date: Esophageal reflux No date: Esophageal stricture     Comment:  a.) s/p multiple (2012, 2015, 2017, 2020) dilitation               procedures No date: GERD (gastroesophageal reflux disease) No date: Heart murmur No date: Hyperlipidemia No date: Hypertension No date: Insomnia No date: Osteoarthritis No date: Osteoporosis No date: Other secondary kyphosis, cervical region No date: PAD (peripheral artery disease) (HCC) 05/2015: Presence of permanent cardiac pacemaker     Comment:  a.) St. Jude device No date:  Restless leg No date: RLS (restless legs syndrome)     Comment:  a.) takes pramipexole No date: Sleep apnea No date: SOB (shortness of breath) No date: T2DM (type 2 diabetes mellitus) (HCC) No date: Urinary incontinence No date: Venous insufficiency of both lower extremities Past Surgical History: 2014: APPENDECTOMY 1998: CARPAL TUNNEL RELEASE; Left 2012: ESOPHAGEAL DILATION     Comment:  2015, 2017, 2020 2019: GASTRIC BYPASS 2019: HERNIA REPAIR 02/05/2019: left knee manipulation; Left 05/2015: PACEMAKER IMPLANT     Comment:  Procedure: PACEMAKER INSERTION; Location: [place],               [place]; Surgeon: Maryjean Ka, MD No date: POLYPECTOMY 2014: SHOULDER SURGERY; Left 2017: SHOULDER SURGERY; Right 2018: TOTAL KNEE ARTHROPLASTY; Right 10/18/2018: TOTAL KNEE ARTHROPLASTY; Left 2012: vein removal; Bilateral BMI    Body Mass Index: 40.24 kg/m     Reproductive/Obstetrics negative OB ROS                             Anesthesia Physical Anesthesia Plan  ASA: 3  Anesthesia Plan: General ETT   Post-op Pain Management:    Induction:   PONV Risk Score and Plan: 4 or greater  Airway Management Planned:   Additional Equipment:   Intra-op Plan:   Post-operative Plan:   Informed Consent: I have reviewed the patients History and Physical, chart, labs and discussed the procedure including the risks, benefits and alternatives for the proposed anesthesia with the patient or authorized  representative who has indicated his/her understanding and acceptance.     Dental Advisory Given  Plan Discussed with: CRNA  Anesthesia Plan Comments:         Anesthesia Quick Evaluation

## 2021-11-03 NOTE — Anesthesia Postprocedure Evaluation (Signed)
Anesthesia Post Note  Patient: Janet [name]  Procedure(s) Performed: C4-7 ANTERIOR CERVICAL DISCECTOMY AND FUSION (GLOBUS FORGE) (Spine Cervical)  Patient location during evaluation: PACU Anesthesia Type: General Level of consciousness: awake and alert Pain management: pain level controlled Vital Signs Assessment: post-procedure vital signs reviewed and stable Respiratory status: spontaneous breathing, nonlabored ventilation, respiratory function stable and patient connected to nasal cannula oxygen Cardiovascular status: blood pressure returned to baseline and stable Postop Assessment: no apparent nausea or vomiting Anesthetic complications: no   No notable events documented.   Last Vitals:  Vitals:   11/03/21 1615 11/03/21 1647  BP:  (!) 168/94  Pulse:  95  Resp:  14  Temp: (!) 36.3 C 36.8 C  SpO2:  100%    Last Pain:  Vitals:   11/03/21 1647  TempSrc: Oral  PainSc: 8                  Cleda Mccreedy Natacha Jepsen

## 2021-11-03 NOTE — Op Note (Signed)
Indications: Ms. 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   Preoperative Note:   Risks of surgery discussed include: infection, bleeding, stroke, coma, death, paralysis, CSF leak, nerve/spinal cord injury, numbness, tingling, weakness, complex regional pain syndrome, recurrent stenosis and/or disc herniation, vascular injury, development of instability, neck/back pain, need for further surgery, persistent symptoms, development of deformity, and the risks of anesthesia. The patient understood these risks and agreed to proceed.     Operative Procedure: 1. Anterior Cervical Discectomy and Fusion C4-5 including bilateral foraminotomies and end plate preparation  2. Anterior Cervical Discectomy and Fusion C5-6 including bilateral foraminotomies and end plate preparation  3. Anterior Cervical Discectomy and Fusion C6-7 including bilateral foraminotomies and end plate preparation 4. Anterior Spinal Instrumentation C4 to 7 5. Anterior arthrodesis from C4 to C7 6. Use of the operative microscope 7. Use of intraoperative flouroscopy  PROCEDURE IN DETAIL: After obtaining informed consent, the patient taken to the operating room, placed in supine position, general anesthesia induced.  The patient had a small shoulder roll placed behind their shoulders.  The patient received preop antibiotics and IV Decadron.  The patient had a neck incision outlined, was  prepped and draped in usual sterile fashion. The incision was injected with local anesthetic.   An incision was opened, dissection taken down medial to the carotid artery and jugular vein, lateral to the trachea and esophagus.  The prevertebral fascia identified and a localizing x-ray demonstrated the correct level.  The longus colli were dissected laterally, and self-retaining retractors placed to open the operative field. The microscope was then brought into the field.  With this complete, distractor pins were placed in the vertebral bodies of C4 and C5.  The distractor was placed from C4 to C5. The annulus at C4-5 were opened with a bovie. Curettes and pituitary rongeurs used to remove the majority of disk at each level, then the drill was used to remove the posterior osteophyte and begin the foraminotomies. The nerve hook was used to elevate the posterior longitudinal ligament, which was then removed with Kerrison rongeurs. The microblunt nerve hook could be passed out the foramen bilaterally.   Meticulous hemostasis was obtained. After hemostasis, structural allograft was tapped behind the anterior lip of the vertebral body at C4-5 (7 mm height).     The distractor was then removed, and the C4 caspar pin removed. Bone wax was used for hemostasis. An additional Caspar pin was placed at C7. The distractor was placed from C5-7, and the annuli at C5-6 and C6-7 were opened using a bovie.  Curettes and pituitary rongeurs used to remove the majority of disk, then the drill was used to remove the posterior osteophyte and begin the foraminotomies. The nerve hook was used to elevate the posterior longitudinal ligament, which was then removed with Kerrison rongeurs. The microblunt nerve hook could be passed out the foramen bilaterally at each level.   Meticulous hemostasis was obtained.  Structural allograft was tapped behind the anterior lip of the vertebral body at C5-6 (7 mm height) and C6-7 (  7 mm height).  The  caspar distractor was removed, and bone wax used for hemostasis at each level. The anterior osteophytes were removed.    A separate, four segment, three level plate (45 mm Globus Xtend) was chosen.  Two screws placed in the vertebral bodies of all four segments, respectively making sure the screws were behind the locking mechanism.  Final AP and lateral radiographs were taken.   Please note that the plate is not inclusive to the interbody structural allograft.  The anchoring mechanism of the plate is completely separate from the allograft.  With everything in good position, the wound was irrigated copiously with bacitracin-containing solution and meticulous hemostasis obtained.  Wound was closed in 2 layers using interrupted inverted 3-0 Vicryl sutures in the platysma and 3-0 monocryl in the dermis.  The wound was dressed with dermabond, the head of bed at 30 degrees, taken to recovery room in stable condition.  No new postop neurological deficits were identified.  All counts were correct at the end of the case.   Monitoring was used throughout without any changes.   I performed the entire procedure with Drake Leach PA as an Geophysicist/field seismologist. An assistant is required for this procedure to aid in tissue dissection and retraction for the safe performance of the procedure.  I performed the critical portions of the procedure.  [name] MD

## 2021-11-03 NOTE — Interval H&P Note (Signed)
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  The various methods of treatment have been discussed with the patient and family. After consideration of risks, benefits and other options for treatment, the patient has consented to  Procedure(s): C4-7 ANTERIOR CERVICAL DISCECTOMY AND FUSION (GLOBUS FORGE) (N/A) as a surgical intervention.  The patient's history has been reviewed, patient examined, no change in status, stable for surgery.  I have reviewed the patient's chart and labs.  Questions were answered to the patient's satisfaction.    Heart sounds normal no MRG. Chest Clear to Auscultation Bilaterally.   Caridad Silveira

## 2021-11-04 ENCOUNTER — Encounter: Payer: Self-pay | Admitting: Neurosurgery

## 2021-11-04 ENCOUNTER — Telehealth: Payer: Self-pay

## 2021-11-04 MED ORDER — BACLOFEN 10 MG PO TABS: 10.0000 mg | ORAL_TABLET | Freq: Three times a day (TID) | ORAL | 2 refills | Status: DC | PRN

## 2021-11-04 MED ORDER — OXYCODONE HCL 5 MG PO TABS: 5.0000 mg | ORAL_TABLET | ORAL | 0 refills | Status: AC | PRN

## 2021-11-04 MED ORDER — ACETAMINOPHEN 325 MG PO TABS: 650.0000 mg | ORAL_TABLET | ORAL | Status: DC | PRN

## 2021-11-04 MED ORDER — CELECOXIB 100 MG PO CAPS: 100.0000 mg | ORAL_CAPSULE | Freq: Two times a day (BID) | ORAL | 0 refills | Status: DC

## 2021-11-04 NOTE — Discharge Instructions (Signed)
Your surgeon has performed an operation on your cervical spine (neck) to relieve pressure on the spinal cord and/or nerves. This involved making an incision in the front of your neck and removing one or more of the discs that support your spine. Next, a small piece of bone, a titanium plate, and screws were used to fuse two or more of the vertebrae (bones) together.  The following are instructions to help in your recovery once you have been discharged from the hospital. Even if you feel well, it is important that you follow these activity guidelines. If you do not let your neck heal properly from the surgery, you can increase the chance of return of your symptoms and other complications.  * Do not take anti-inflammatory medications for 3 months after surgery (naproxen [Aleve], ibuprofen [Advil, Motrin], etc.). These medications can prevent your bones from healing properly.  Celebrex is ok to take  Activity    No bending, lifting, or twisting ("BLT"). Avoid lifting objects heavier than 10 pounds (gallon milk jug).  Where possible, avoid household activities that involve lifting, bending, reaching, pushing, or pulling such as laundry, vacuuming, grocery shopping, and childcare. Try to arrange for help from friends and family for these activities while your back heals.  Increase physical activity slowly as tolerated.  Taking short walks is encouraged, but avoid strenuous exercise. Do not jog, run, bicycle, lift weights, or participate in any other exercises unless specifically allowed by your doctor.  Talk to your doctor before resuming sexual activity.  You should not drive until cleared by your doctor.  Until released by your doctor, you should not return to work or school.  You should rest at home and let your body heal.   You may shower three days after your surgery.  After showering, lightly dab your incision dry. Do not take a tub bath or go swimming until approved by your doctor at your  follow-up appointment.  If your doctor ordered a cervical collar (neck brace) for you, you should wear it whenever you are out of bed. You may remove it when lying down or sleeping, but you should wear it at all other times. Not all neck surgeries require a cervical collar.  If you smoke, we strongly recommend that you quit.  Smoking has been proven to interfere with normal bone healing and will dramatically reduce the success rate of your surgery. Please contact [facility] (800-QUIT-NOW) and use the resources at www.[facility].com for assistance in stopping smoking.  Surgical Incision   If you have a dressing on your incision, you may remove it two days after your surgery. Keep your incision area clean and dry.  If you have staples or stitches on your incision, you should have a follow up scheduled for removal. If you do not have staples or stitches, you will have steri-strips (small pieces of surgical tape) or Dermabond glue. The steri-strips/glue should begin to peel away within about a week (it is fine if the steri-strips fall off before then). If the strips are still in place one week after your surgery, you may gently remove them.  Diet           You may return to your usual diet. However, you may experience discomfort when swallowing in the first month after your surgery. This is normal. You may find that softer foods are more comfortable for you to swallow. Be sure to stay hydrated.  When to Contact Us  You may experience pain in your neck and/or   pain between your shoulder blades. This is normal and should improve in the next few weeks with the help of pain medication, muscle relaxers, and rest. Some patients report that a warm compress on the back of the neck or between the shoulder blades helps.  However, should you experience any of the following, contact us immediately: New numbness or weakness Pain that is progressively getting worse, and is not relieved by your pain medication,  muscle relaxers, rest, and warm compresses Bleeding, redness, swelling, pain, or drainage from surgical incision Chills or flu-like symptoms Fever greater than 101.0 F (38.3 C) Inability to eat, drink fluids, or take medications Problems with bowel or bladder functions Difficulty breathing or shortness of breath Warmth, tenderness, or swelling in your calf Contact Information During office hours (Monday-Friday 9 am to 5 pm), please call your physician at [phone] and ask for Kendelyn Jean After hours and weekends, please call [phone] and speak with the neurosurgeon on call For a life-threatening emergency, call 911   

## 2021-11-04 NOTE — Discharge Summary (Signed)
Physician Discharge Summary  Patient ID: Janet [name] MRN: [id number] DOB/AGE: March 01, 1950 71 y.o.  Admit date: 11/03/2021 Discharge date: 11/04/2021  Admission Diagnoses: see below  Discharge Diagnoses:  Principal Problem:   Cervical myelopathy (HCC) Active Problems:   Spinal stenosis in cervical region   Other secondary kyphosis, cervical region   Discharged Condition: good  Hospital Course: Janet [name] presented with the above diagnoses.  She underwent surgery and was stable for discharge on POD1.  Consults: None  Significant Diagnostic Studies: radiology: X-Ray: Correct localization and implant placement  Treatments: surgery: ACDF C4-7  Discharge Exam: Blood pressure (!) 170/97, pulse (!) 105, temperature 98 F (36.7 C), resp. rate 16, height 5\' 2"  (1.575 m), weight 99.8 kg, SpO2 96 %. General appearance: alert and cooperative  CNI 5/5 throughout BUE Neck soft, trachea midline  Disposition: Discharge disposition: 01-Home or Self Care       Discharge Instructions     Discharge patient   Complete by: As directed    Discharge disposition: 01-Home or Self Care   Discharge patient date: 11/04/2021   Incentive spirometry RT   Complete by: As directed       Allergies as of 11/04/2021   No Known Allergies      Medication List     STOP taking these medications    meloxicam 15 MG tablet Commonly known as: MOBIC       TAKE these medications    acetaminophen 325 MG tablet Commonly known as: TYLENOL Take 2 tablets (650 mg total) by mouth every 4 (four) hours as needed for mild pain ((score 1 to 3) or temp > 100.5).   aspirin EC 81 MG tablet Take 81 mg by mouth daily.   atorvastatin 40 MG tablet Commonly known as: LIPITOR Take 40 mg by mouth at bedtime.   baclofen 10 MG tablet Commonly known as: LIORESAL Take 1 tablet (10 mg total) by mouth 3 (three) times daily as needed for muscle spasms. What changed: See the new instructions.   celecoxib 100 MG  capsule Commonly known as: CeleBREX Take 1 capsule (100 mg total) by mouth 2 (two) times daily.   clonazePAM 0.5 MG tablet Commonly known as: KLONOPIN Take 0.5 tablets by mouth in the morning and at bedtime.   DULoxetine 30 MG capsule Commonly known as: CYMBALTA TAKE 1 CAPSULE(30 MG) BY MOUTH DAILY   gabapentin 300 MG capsule Commonly known as: NEURONTIN Take 300 mg by mouth 3 (three) times daily.   losartan-hydrochlorothiazide 100-25 MG tablet Commonly known as: HYZAAR TAKE 1 TABLET BY MOUTH DAILY   nortriptyline 10 MG capsule Commonly known as: PAMELOR Take 2 mg by mouth 2 (two) times daily.   oxyCODONE 5 MG immediate release tablet Commonly known as: Oxy IR/ROXICODONE Take 1 tablet (5 mg total) by mouth every 3 (three) hours as needed for up to 7 days for moderate pain ((score 4 to 6)).   pramipexole 1 MG tablet Commonly known as: MIRAPEX Take 1 tablet (1 mg total) by mouth 2 (two) times daily.        Follow-up Information     01/04/2022, PA-C Follow up in 2 week(s).   Specialty: Neurosurgery Why: as scheduled Contact information: [address] [place] [phone]                 Signed: [phone] 11/04/2021, 9:38 AM

## 2021-11-04 NOTE — Evaluation (Signed)
Occupational Therapy Evaluation Patient Details Name: Janet [name] MRN: [id number] DOB: 02-05-1951 Today's Date: 11/04/2021   History of Present Illness Pt admitted for cervical myelopathy and is s/p ACDF C4-C7 on 11/03/21. History includes GERD, HLD, HTN, PAD and urinary incontience.   Clinical Impression   Patient presenting with decrease independence in self care and safety. Pt's husband present in room during eval. Pt currently lives at home with her daughter and grandson and will have 24/7 assistance at home. Bathroom is accessible at home and has a built in bench and hand held shower head. Pt was educated on cervical precautions and was able to verbalize understanding. Pt was concerned about not receiving a neck brace, but was easily educated on why it was not needed. Pt was able to dress herself with min guard/supervision. Sit <>stand pt also required min guard/supervision. No OT treatment recommended at this time. Ot to complete order.        Recommendations for follow up therapy are one component of a multi-disciplinary discharge planning process, led by the attending physician.  Recommendations may be updated based on patient status, additional functional criteria and insurance authorization.   Follow Up Recommendations  No OT follow up    Assistance Recommended at Discharge Set up Supervision/Assistance  Patient can return home with the following A little help with bathing/dressing/bathroom;Assistance with cooking/housework;Assist for transportation    Functional Status Assessment  Patient has had a recent decline in their functional status and demonstrates the ability to make significant improvements in function in a reasonable and predictable amount of time.  Equipment Recommendations  None recommended by OT    Recommendations for Other Services       Precautions / Restrictions Precautions Precautions: Cervical;Fall Precaution Comments: no brace needed per  orders Restrictions Weight Bearing Restrictions: No      Mobility Bed Mobility               General bed mobility comments: Pt in recliner upon arrival.    Transfers Overall transfer level: Needs assistance Equipment used: None Transfers: Sit to/from Stand Sit to Stand: Min guard           General transfer comment: pt very cautious with all mobility, however with subsequent transfers, able to perform with supervision. Once satnding, upright posture and no AD      Balance Overall balance assessment: Independent                                         ADL either performed or assessed with clinical judgement   ADL Overall ADL's : Needs assistance/impaired                                       General ADL Comments: Patient required min guard/supervision for all ADL task.      Pertinent Vitals/Pain Pain Assessment Pain Score: 5  Pain Location: Neck Pain Descriptors / Indicators: Discomfort Pain Intervention(s): Monitored during session     Hand Dominance     Extremity/Trunk Assessment Upper Extremity Assessment Upper Extremity Assessment: Overall WFL for tasks assessed   Lower Extremity Assessment Lower Extremity Assessment: Overall WFL for tasks assessed   Cervical / Trunk Assessment Cervical / Trunk Assessment: Neck Surgery   Communication Communication Communication: No difficulties   Cognition Arousal/Alertness: Awake/alert Behavior During Therapy:  WFL for tasks assessed/performed Overall Cognitive Status: Within Functional Limits for tasks assessed                                 General Comments: pleasant and agreeable to session                Home Living Family/patient expects to be discharged to:: Private residence Living Arrangements: Children Available Help at Discharge: Family;Available 24 hours/day Type of Home: House Home Access: Stairs to enter [employer] of  Steps: 4 Entrance Stairs-Rails: Left Home Layout: One level;Able to live on main level with bedroom/bathroom     Bathroom Shower/Tub: Tub/shower unit (hand held shower head)         Home Equipment: Cane - single point          Prior Functioning/Environment Prior Level of Function : Independent/Modified Independent             Mobility Comments: occasionally uses [facility] for community distances ADLs Comments: indep                 OT Goals(Current goals can be found in the care plan section) Acute Rehab OT Goals Patient Stated Goal: to return home Time For Goal Achievement: 11/04/21 Potential to Achieve Goals: Good  OT Frequency:         AM-PAC OT "6 Clicks" Daily Activity     Outcome Measure Help from another person eating meals?: None Help from another person taking care of personal grooming?: A Little Help from another person toileting, which includes using toliet, bedpan, or urinal?: None Help from another person bathing (including washing, rinsing, drying)?: A Little Help from another person to put on and taking off regular upper body clothing?: A Little Help from another person to put on and taking off regular lower body clothing?: A Little 6 Click Score: 20   End of Session Nurse Communication: Mobility status  Activity Tolerance: Patient tolerated treatment well Patient left: in chair;with bed alarm set;with call bell/phone within reach;with family/visitor present                   Time: 1010-1026 OT Time Calculation (min): 16 min Charges:  OT General Charges $OT Visit: 1 Visit OT Evaluation $OT Eval Low Complexity: 1 Low OT Treatments $Self Care/Home Management : 8-22 mins    Equan Cogbill, OTS 11/04/2021, 11:09 AM

## 2021-11-04 NOTE — Progress Notes (Signed)
    Attending Progress Note  History: Janet [name] is here for cervical myelopathy.   POD1: She is doing well.  Her chest pain has resolved, but she feels it may have been reflux.  Her arms feel better.  Physical Exam: Vitals:   11/03/21 2304 11/04/21 0459  BP: (!) 175/98 (!) 173/90  Pulse: (!) 116 (!) 107  Resp: 20 18  Temp: 97.9 F (36.6 C) (!) 97.5 F (36.4 C)  SpO2: 95% 97%    AA Ox3 CNI  Strength:5/5 throughout BUE Neck soft, trachea midline Incision c/d/i  Data:  Trop normal  Other tests/results: n/a  Assessment/Plan:  Janet [name] is doing well on POD1  - mobilize - pain control - DVT prophylaxis - PTOT today   [name] MD, [facility] Department of Neurosurgery

## 2021-11-04 NOTE — Progress Notes (Signed)
Spoke with the patient, She lives at home with her daughter She has a rolling walker and a cane at home. [facility] iss set up for [facility] services No additional needs

## 2021-11-04 NOTE — Telephone Encounter (Signed)
Transition Care Management Unsuccessful Follow-up Telephone Call  Date of discharge and from where:  [facility] 9/6  Attempts:  1st Attempt  Reason for unsuccessful TCM follow-up call:  Left voice message

## 2021-11-04 NOTE — Plan of Care (Signed)
Problem: Education: Goal: Ability to verbalize activity precautions or restrictions will improve 11/04/2021 0654 by Chaselynn Kepple, Laurena Slimmer, RN Outcome: Progressing 11/04/2021 0653 by Lynix Bonine, Laurena Slimmer, RN Outcome: Progressing Goal: Knowledge of the prescribed therapeutic regimen will improve 11/04/2021 0654 by Eugenia Eldredge, Laurena Slimmer, RN Outcome: Progressing 11/04/2021 0653 by Jennifer Holland, Laurena Slimmer, RN Outcome: Progressing Goal: Understanding of discharge needs will improve 11/04/2021 0654 by Brand Siever, Laurena Slimmer, RN Outcome: Progressing 11/04/2021 0653 by Kellie Chisolm, Laurena Slimmer, RN Outcome: Progressing   Problem: Activity: Goal: Ability to avoid complications of mobility impairment will improve 11/04/2021 0654 by Tajai Suder, Laurena Slimmer, RN Outcome: Progressing 11/04/2021 0653 by Zorah Backes, Laurena Slimmer, RN Outcome: Progressing Goal: Ability to tolerate increased activity will improve 11/04/2021 0654 by Zollie Clemence, Laurena Slimmer, RN Outcome: Progressing 11/04/2021 0653 by Dajae Kizer, Laurena Slimmer, RN Outcome: Progressing Goal: Will remain free from falls 11/04/2021 0654 by Jackilyn Umphlett, Laurena Slimmer, RN Outcome: Progressing 11/04/2021 0653 by Raynetta Osterloh, Laurena Slimmer, RN Outcome: Progressing   Problem: Bowel/Gastric: Goal: Gastrointestinal status for postoperative course will improve 11/04/2021 0654 by Ilia Dimaano, Laurena Slimmer, RN Outcome: Progressing 11/04/2021 0653 by Leyna Vanderkolk, Laurena Slimmer, RN Outcome: Progressing   Problem: Clinical Measurements: Goal: Ability to maintain clinical measurements within normal limits will improve 11/04/2021 0654 by Onalee Steinbach, Laurena Slimmer, RN Outcome: Progressing 11/04/2021 0653 by Dereonna Lensing, Laurena Slimmer, RN Outcome: Progressing Goal: Postoperative complications will be avoided or minimized 11/04/2021 0654 by Brynden Thune, Laurena Slimmer, RN Outcome: Progressing 11/04/2021 0653 by Taleia Sadowski, Laurena Slimmer, RN Outcome: Progressing Goal: Diagnostic test results will improve 11/04/2021 0654 by Shuntay Everetts, Laurena Slimmer, RN Outcome: Progressing 11/04/2021 0653  by Emmalena Canny, Laurena Slimmer, RN Outcome: Progressing   Problem: Pain Management: Goal: Pain level will decrease 11/04/2021 0654 by Anjoli Diemer, Laurena Slimmer, RN Outcome: Progressing 11/04/2021 0653 by Genesi Stefanko, Laurena Slimmer, RN Outcome: Progressing   Problem: Skin Integrity: Goal: Will show signs of wound healing 11/04/2021 0654 by Gregory Dowe, Laurena Slimmer, RN Outcome: Progressing 11/04/2021 0653 by Naila Elizondo, Laurena Slimmer, RN Outcome: Progressing   Problem: Health Behavior/Discharge Planning: Goal: Identification of resources available to assist in meeting health care needs will improve 11/04/2021 0654 by Shakya Sebring, Laurena Slimmer, RN Outcome: Progressing 11/04/2021 0653 by Madlyn Crosby, Laurena Slimmer, RN Outcome: Progressing   Problem: Bladder/Genitourinary: Goal: Urinary functional status for postoperative course will improve 11/04/2021 0654 by Dwan Hemmelgarn, Laurena Slimmer, RN Outcome: Progressing 11/04/2021 0653 by Laruen Risser, Laurena Slimmer, RN Outcome: Progressing   Problem: Education: Goal: Knowledge of General Education information will improve Description: Including pain rating scale, medication(s)/side effects and non-pharmacologic comfort measures 11/04/2021 0654 by Athanasios Heldman, Laurena Slimmer, RN Outcome: Progressing 11/04/2021 0653 by Maricia Scotti, Laurena Slimmer, RN Outcome: Progressing   Problem: Health Behavior/Discharge Planning: Goal: Ability to manage health-related needs will improve 11/04/2021 0654 by Jakera Beaupre, Laurena Slimmer, RN Outcome: Progressing 11/04/2021 0653 by Sheralyn Pinegar, Laurena Slimmer, RN Outcome: Progressing   Problem: Clinical Measurements: Goal: Ability to maintain clinical measurements within normal limits will improve 11/04/2021 0654 by Diaz Crago, Laurena Slimmer, RN Outcome: Progressing 11/04/2021 0653 by Zykiria Bruening, Laurena Slimmer, RN Outcome: Progressing Goal: Will remain free from infection 11/04/2021 0654 by Daemyn Gariepy, Laurena Slimmer, RN Outcome: Progressing 11/04/2021 0653 by Yohance Hathorne, Laurena Slimmer, RN Outcome: Progressing Goal: Diagnostic test results will  improve 11/04/2021 0654 by Eliodoro Gullett, Laurena Slimmer, RN Outcome: Progressing 11/04/2021 0653 by Meila Berke, Laurena Slimmer, RN Outcome: Progressing Goal: Respiratory complications will improve 11/04/2021 0654 by Paiton Boultinghouse, Laurena Slimmer, RN Outcome: Progressing 11/04/2021 0653 by Faraaz Wolin, Laurena Slimmer, RN Outcome: Progressing Goal: Cardiovascular complication will be avoided 11/04/2021 0654 by Tay Whitwell, Laurena Slimmer, RN Outcome: Progressing  11/04/2021 0653 by Tram Wrenn, Laurena Slimmer, RN Outcome: Progressing   Problem: Activity: Goal: Risk for activity intolerance will decrease 11/04/2021 0654 by Mahesh Sizemore, Laurena Slimmer, RN Outcome: Progressing 11/04/2021 0653 by Shaye Lagace, Laurena Slimmer, RN Outcome: Progressing   Problem: Nutrition: Goal: Adequate nutrition will be maintained 11/04/2021 0654 by Monike Bragdon, Laurena Slimmer, RN Outcome: Progressing 11/04/2021 0653 by Teniqua Marron, Laurena Slimmer, RN Outcome: Progressing   Problem: Coping: Goal: Level of anxiety will decrease 11/04/2021 0654 by Trelyn Vanderlinde, Laurena Slimmer, RN Outcome: Progressing 11/04/2021 0653 by Syra Sirmons, Laurena Slimmer, RN Outcome: Progressing   Problem: Elimination: Goal: Will not experience complications related to bowel motility 11/04/2021 0654 by Akaylah Lalley, Laurena Slimmer, RN Outcome: Progressing 11/04/2021 0653 by Bernetta Sutley, Laurena Slimmer, RN Outcome: Progressing Goal: Will not experience complications related to urinary retention 11/04/2021 0654 by Adja Ruff, Laurena Slimmer, RN Outcome: Progressing 11/04/2021 0653 by Jaelle Campanile, Laurena Slimmer, RN Outcome: Progressing   Problem: Pain Managment: Goal: General experience of comfort will improve 11/04/2021 0654 by Shamell Hittle, Laurena Slimmer, RN Outcome: Progressing 11/04/2021 0653 by Bryley Chrisman, Laurena Slimmer, RN Outcome: Progressing   Problem: Safety: Goal: Ability to remain free from injury will improve 11/04/2021 0654 by Tanay Massiah, Laurena Slimmer, RN Outcome: Progressing 11/04/2021 0653 by Hagen Tidd, Laurena Slimmer, RN Outcome: Progressing   Problem: Skin Integrity: Goal: Risk for impaired skin  integrity will decrease 11/04/2021 0654 by Pheonix Wisby, Laurena Slimmer, RN Outcome: Progressing 11/04/2021 0653 by Ronrico Dupin, Laurena Slimmer, RN Outcome: Progressing

## 2021-11-04 NOTE — Progress Notes (Signed)
Patient reports 7/10 chest pain. Pain radiates to right arm. Patient also states "it is hard to breathe." Vital signs obtained. Dr. Myer Haff made aware. EKG and troponin ordered. PRN morphine given.     11/03/21 2004  Assess: MEWS Score  Temp 97.6 F (36.4 C)  BP (!) 183/95  MAP (mmHg) 119  Pulse Rate (!) 113  Resp 17  Level of Consciousness Alert  SpO2 94 %  O2 Device Room Air  Assess: MEWS Score  MEWS Temp 0  MEWS Systolic 0  MEWS Pulse 2  MEWS RR 0  MEWS LOC 0  MEWS Score 2  MEWS Score Color Yellow  Assess: if the MEWS score is Yellow or Red  Were vital signs taken at a resting state? Yes  Focused Assessment No change from prior assessment  Does the patient meet 2 or more of the SIRS criteria? Yes  Does the patient have a confirmed or suspected source of infection? No  MEWS guidelines implemented *See Row Information* Yes  Treat  MEWS Interventions Administered prn meds/treatments (Morphine)  Take Vital Signs  Increase Vital Sign Frequency  Yellow: Q 2hr X 2 then Q 4hr X 2, if remains yellow, continue Q 4hrs  Notify: Charge Nurse/RN  Name of Charge Nurse/RN Notified Erie Noe  Date Charge Nurse/RN Notified 11/03/21  Time Charge Nurse/RN Notified 2004  Notify: Provider  Provider Name/Title [name], MD  Date Provider Notified 11/03/21  Time Provider Notified 2028  Method of Notification Call  Notification Reason Other (Comment) (Pt reports chest pain)  Provider response See new orders  Date of Provider Response 11/03/21  Time of Provider Response 2028  Notify: Rapid Response  Name of Rapid Response RN Notified Not needed at this time  Document  Patient Outcome Stabilized after interventions  Progress note created (see row info) Yes  Assess: SIRS CRITERIA  SIRS Temperature  0  SIRS Pulse 1  SIRS Respirations  0  SIRS WBC 1  SIRS Score Sum  2

## 2021-11-04 NOTE — Evaluation (Signed)
Physical Therapy Evaluation Patient Details Name: Janet [name] MRN: [id number] DOB: Aug 22, 1950 Today's Date: 11/04/2021  History of Present Illness  Pt admitted for cervical myelopathy and is s/p ACDF C4-C7 on 11/03/21. History includes GERD, HLD, HTN, PAD and urinary incontience.  Clinical Impression  Pt is a pleasant 71 year old female who was admitted for ACDF from C4-C7. Pt performs transfers with cga and ambulation with supervision and no AD. Pt does report pain and recent pain medication given. Stair training performed and normal gait, although decreased speed. Will have sufficient assistance at home. Reports chronic N/T in B fingers and L leg. Pt demonstrates all bed mobility/transfers/ambulation at baseline level. Pt does not require any further PT needs at this time. Pt will be dc in house and does not require follow up. RN aware. Will dc current orders.      Recommendations for follow up therapy are one component of a multi-disciplinary discharge planning process, led by the attending physician.  Recommendations may be updated based on patient status, additional functional criteria and insurance authorization.  Follow Up Recommendations Home health PT      Assistance Recommended at Discharge Set up Supervision/Assistance  Patient can return home with the following  A little help with walking and/or transfers;Help with stairs or ramp for entrance    Equipment Recommendations None recommended by PT  Recommendations for Other Services       Functional Status Assessment Patient has had a recent decline in their functional status and demonstrates the ability to make significant improvements in function in a reasonable and predictable amount of time.     Precautions / Restrictions Precautions Precautions: Fall Precaution Comments: no brace needed per orders Restrictions Weight Bearing Restrictions: No      Mobility  Bed Mobility               General bed mobility  comments: NT, received in recliner    Transfers Overall transfer level: Needs assistance Equipment used: None Transfers: Sit to/from Stand Sit to Stand: Min guard           General transfer comment: pt very cautious with all mobility, however with subsequent transfers, able to perform with supervision. Once satnding, upright posture and no AD    Ambulation/Gait Ambulation/Gait assistance: Supervision Gait Distance (Feet): 200 Feet Assistive device: None Gait Pattern/deviations: WFL(Within Functional Limits)       General Gait Details: safe technique with no LOB. Slow gait speed  Stairs Stairs: Yes Stairs assistance: Min guard Stair Management: One rail Left, Step to pattern Number of Stairs: 4 General stair comments: up/down with step to gait pattern and safe technique. Cues for neck precautions during stair training.  Wheelchair Mobility    Modified Rankin (Stroke Patients Only)       Balance Overall balance assessment: Independent                                           Pertinent Vitals/Pain Pain Assessment Pain Assessment: 0-10 Pain Score: 4  Pain Location: ant neck Pain Descriptors / Indicators: Operative site guarding Pain Intervention(s): Limited activity within patient's tolerance, Premedicated before session, Repositioned    Home Living Family/patient expects to be discharged to:: Private residence Living Arrangements: Children (daughter available in PM) Available Help at Discharge: Family;Available 24 hours/day (x 1 week) Type of Home: House Home Access: Stairs to enter Entrance Stairs-Rails: Left Entrance  Stairs-Number of Steps: 4   Home Layout: One level Home Equipment: Cane - single point      Prior Function Prior Level of Function : Independent/Modified Independent             Mobility Comments: occasionally uses SPC for community distances ADLs Comments: indep     Hand Dominance        Extremity/Trunk  Assessment   Upper Extremity Assessment Upper Extremity Assessment: Overall WFL for tasks assessed (does report N/T in fingertips of B hands)    Lower Extremity Assessment Lower Extremity Assessment: Overall WFL for tasks assessed (N/T in L thigh distal to toes)       Communication   Communication: No difficulties  Cognition Arousal/Alertness: Awake/alert Behavior During Therapy: WFL for tasks assessed/performed Overall Cognitive Status: Within Functional Limits for tasks assessed                                 General Comments: pleasant and agreeable to session        General Comments      Exercises Other Exercises Other Exercises: ambulated to bathroom and able to void. Perform self care and donning undergarmets with supervision along with hand hygiene.   Assessment/Plan    PT Assessment All further PT needs can be met in the next venue of care  PT Problem List Decreased mobility;Pain       PT Treatment Interventions      PT Goals (Current goals can be found in the Care Plan section)  Acute Rehab PT Goals Patient Stated Goal: to go home PT Goal Formulation: All assessment and education complete, DC therapy Time For Goal Achievement: 11/04/21 Potential to Achieve Goals: Good    Frequency       Co-evaluation               AM-PAC PT "6 Clicks" Mobility  Outcome Measure Help needed turning from your back to your side while in a flat bed without using bedrails?: None Help needed moving from lying on your back to sitting on the side of a flat bed without using bedrails?: None Help needed moving to and from a bed to a chair (including a wheelchair)?: None Help needed standing up from a chair using your arms (e.g., wheelchair or bedside chair)?: None Help needed to walk in hospital room?: A Little Help needed climbing 3-5 steps with a railing? : A Little 6 Click Score: 22    End of Session Equipment Utilized During Treatment: Gait  belt Activity Tolerance: Patient tolerated treatment well Patient left: in chair;with chair alarm set Nurse Communication: Mobility status PT Visit Diagnosis: Muscle weakness (generalized) (M62.81);Difficulty in walking, not elsewhere classified (R26.2);Pain Pain - Right/Left:  (ant neck) Pain - part of body:  (neck)    Time: 1093-2355 PT Time Calculation (min) (ACUTE ONLY): 17 min   Charges:   PT Evaluation $PT Eval Low Complexity: 1 Low PT Treatments $Gait Training: 8-22 mins        Greggory Stallion, PT, DPT, GCS [phone]   Ozelle Brubacher 11/04/2021, 10:25 AM

## 2021-11-08 ENCOUNTER — Ambulatory Visit (INDEPENDENT_AMBULATORY_CARE_PROVIDER_SITE_OTHER): Payer: Medicare Other

## 2021-11-08 VITALS — BP 159/92 | HR 105

## 2021-11-08 DIAGNOSIS — Z0000 Encounter for general adult medical examination without abnormal findings: Secondary | ICD-10-CM | POA: Diagnosis not present

## 2021-11-08 NOTE — Progress Notes (Addendum)
I connected with  Janet [name] on 11/08/21 by a audio enabled telemedicine application and verified that I am speaking with the correct person using two identifiers.  Patient Location: Home  Provider Location: Office/Clinic  I discussed the limitations of evaluation and management by telemedicine. The patient expressed understanding and agreed to proceed.  Subjective:   Janet [name] is a 71 y.o. female who presents for Medicare Annual (Subsequent) preventive examination.  Review of Systems    Per HPI unless specifically indicated below.  Cardiac Risk Factors include: advanced age (>17men, >37 women);female gender, hyperlipidemia, Type 2 diabetes and essential hypertension.         Objective:       11/08/2021    2:25 PM 11/04/2021    8:05 AM 11/04/2021    4:59 AM  Vitals with BMI  Systolic 159 170 147  Diastolic 92 97 90  Pulse 105 105 107    Today's Vitals   11/08/21 1425 11/08/21 1433  BP: (!) 159/92   Pulse: (!) 105   PainSc:  3    There is no height or weight on file to calculate BMI.     11/03/2021   10:30 AM 10/26/2021    1:09 PM 02/26/2021   11:57 AM 10/20/2020    9:06 AM  Advanced Directives  Does Patient Have a Medical Advance Directive? Yes Yes No Yes  Type of Advance Directive Living will Living will  Living will  Does patient want to make changes to medical advance directive? No - Patient declined     Would patient like information on creating a medical advance directive?   No - Patient declined     Current Medications (verified) Outpatient Encounter Medications as of 11/08/2021  Medication Sig   aspirin 81 MG EC tablet Take 81 mg by mouth daily.   atorvastatin (LIPITOR) 40 MG tablet Take 40 mg by mouth at bedtime.   celecoxib (CELEBREX) 100 MG capsule Take 1 capsule (100 mg total) by mouth 2 (two) times daily.   clonazePAM (KLONOPIN) 0.5 MG tablet Take 0.5 tablets by mouth in the morning and at bedtime.   DULoxetine (CYMBALTA) 30 MG capsule TAKE 1 CAPSULE(30  MG) BY MOUTH DAILY   gabapentin (NEURONTIN) 300 MG capsule Take 300 mg by mouth 2 (two) times daily.   losartan-hydrochlorothiazide (HYZAAR) 100-25 MG tablet TAKE 1 TABLET BY MOUTH DAILY   nortriptyline (PAMELOR) 10 MG capsule Take 2 mg by mouth 2 (two) times daily.   pramipexole (MIRAPEX) 1 MG tablet Take 1 tablet (1 mg total) by mouth 2 (two) times daily.   acetaminophen (TYLENOL) 325 MG tablet Take 2 tablets (650 mg total) by mouth every 4 (four) hours as needed for mild pain ((score 1 to 3) or temp > 100.5). (Patient not taking: Reported on 11/08/2021)   baclofen (LIORESAL) 10 MG tablet Take 1 tablet (10 mg total) by mouth 3 (three) times daily as needed for muscle spasms. (Patient not taking: Reported on 11/08/2021)   oxyCODONE (OXY IR/ROXICODONE) 5 MG immediate release tablet Take 1 tablet (5 mg total) by mouth every 3 (three) hours as needed for up to 7 days for moderate pain ((score 4 to 6)). (Patient not taking: Reported on 11/08/2021)   No facility-administered encounter medications on file as of 11/08/2021.    Allergies (verified) Patient has no known allergies.   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 Jude device   Restless leg    RLS (restless legs syndrome)    a.) takes pramipexole   Sleep apnea    SOB (shortness of breath)    T2DM (type 2 diabetes mellitus) (HCC)    Urinary incontinence    Venous  insufficiency of both lower extremities    Past Surgical History:  Procedure Laterality Date   ANTERIOR CERVICAL DECOMP/DISCECTOMY FUSION N/A 11/03/2021   Procedure: C4-7 ANTERIOR CERVICAL DISCECTOMY AND FUSION (GLOBUS FORGE);  Surgeon: Venetia NightYarbrough, [place], MD;  Location: [facility] ORS;  Service: Neurosurgery;  Laterality: N/A;   APPENDECTOMY  2014   CARPAL TUNNEL RELEASE Left 1998   ESOPHAGEAL DILATION  2012   2015, 2017, 2020   GASTRIC BYPASS  2019   HERNIA REPAIR  2019   left knee manipulation Left 02/05/2019   PACEMAKER IMPLANT  05/2015   Procedure: PACEMAKER INSERTION; [place] Surgeon: Maryjean KaGoel, MD   POLYPECTOMY     SHOULDER SURGERY Left 2014   SHOULDER SURGERY Right 2017   TOTAL KNEE ARTHROPLASTY Right 2018   TOTAL KNEE ARTHROPLASTY Left 10/18/2018   vein removal Bilateral 2012   Family History  Problem Relation Age of Onset   Heart attack Mother 3586   Heart disease Father    Thyroid disease Father    Skin cancer Father    Brain cancer Maternal Grandmother    Social History   Socioeconomic History   Marital status: Legally Separated    Spouse name: Elissa LovettJesus Glanzer   Number of children: 6   Years of education: Not on file   Highest education level: Not on file  Occupational History   Occupation: Homemaker  Tobacco Use   Smoking status: Former    Packs/day: 1.00    Years: 25.00    Total pack years: 25.00    Types: Cigarettes    Quit date: 2007    Years since quitting: 16.7   Smokeless tobacco: Former  Building services engineerVaping Use   Vaping Use: Never used  Substance and Sexual Activity   Alcohol use: Yes    Comment: very rarely   Drug use: Never   Sexual activity: Not Currently  Other Topics Concern   Not on file  Social History Narrative   Not on file   Social Determinants of Health   Financial Resource Strain: Medium Risk (11/08/2021)   Overall Financial Resource Strain (CARDIA)    Difficulty of Paying Living Expenses: Somewhat hard  Food Insecurity: No Food  Insecurity (11/08/2021)   Hunger Vital Sign    Worried About Running Out of Food in the Last Year: Never true    Ran Out of Food in the Last Year: Never true  Transportation Needs: No Transportation Needs (11/08/2021)   PRAPARE - Administrator, Civil ServiceTransportation    Lack of Transportation (Medical): No    Lack of Transportation (Non-Medical): No  Physical Activity: Inactive (11/08/2021)   Exercise Vital Sign  Days of Exercise per Week: 0 days    Minutes of Exercise per Session: 0 min  Stress: No Stress Concern Present (11/08/2021)   [employer] of Occupational Health - Occupational Stress Questionnaire    Feeling of Stress : Not at all  Social Connections: Socially Isolated (11/08/2021)   Social Connection and Isolation Panel [NHANES]    Frequency of Communication with Friends and Family: More than three times a week    Frequency of Social Gatherings with Friends and Family: More than three times a week    Attends Religious Services: Never    Database administrator or Organizations: No    Attends Banker Meetings: Never    Marital Status: Separated    Tobacco Counseling Counseling given: Not Answered   Clinical Intake:  Pre-visit preparation completed: No  Pain : 0-10 Pain Score: 3  Pain Type: Other (Comment) (post surgery back pain) Pain Location: Back Pain Orientation: Mid Pain Descriptors / Indicators: Aching Pain Onset: In the past 7 days Pain Frequency: Intermittent Pain Relieving Factors: Tylenol  Pain Relieving Factors: Tylenol  Nutritional Status: BMI > 30  Obese Nutritional Risks: None Diabetes: Yes CBG done?: No Did pt. bring in CBG monitor from home?: No  How often do you need to have someone help you when you read instructions, pamphlets, or other written materials from your doctor or pharmacy?: 1 - Never  Diabetic?Nutrition Risk Assessment:  Has the patient had any N/V/D within the last 2 months?  No  Does the patient have any non-healing wounds?  No   Has the patient had any unintentional weight loss or weight gain?  No   Diabetes:  Is the patient diabetic?  Yes  If diabetic, was a CBG obtained today?  No  Did the patient bring in their glucometer from home?  No  How often do you monitor your CBG's? Never .   Financial Strains and Diabetes Management:  Are you having any financial strains with the device, your supplies or your medication? No .  Does the patient want to be seen by Chronic Care Management for management of their diabetes?  No  Would the patient like to be referred to a Nutritionist or for Diabetic Management?  No   Diabetic Exams:  Diabetic Eye Exam: Overdue for diabetic eye exam. Pt has been advised about the importance in completing this exam. Patient advised to call and schedule an eye exam. Diabetic Foot Exam: Completed 09/10/2021    Interpreter Needed?: No  Information entered by :: Laurel Dimmer, CMA   Activities of Daily Living    11/08/2021    2:32 PM 11/03/2021    4:52 PM  In your present state of health, do you have any difficulty performing the following activities:  Hearing? 0 0  Vision? 1 0  Comment wear glasses   Difficulty concentrating or making decisions? 1 0  Walking or climbing stairs? 0 1  Dressing or bathing? 0 0  Doing errands, shopping? 0 0    Patient Care Team: Smitty Cords, DO as PCP - General (Family Medicine) [name], [name], RPH-CPP (Pharmacist)  Indicate any recent Medical Services you may have received from other than [facility] providers in the past year (date may be approximate). Pt [facility] for  surgery, with the diagnosis of Cervical stenosis of spinal canal, Cervical myelopathy.    Assessment:   This is a routine wellness examination for Janet [name].  Hearing/Vision screen Denies any hearing issues.  Overdue for Annual Eye Exam. Pt given information to schedule her Annual eye Exam. Denies any new vision issues, wear glasses   Dietary  issues and exercise activities discussed: Current Exercise Habits: The patient does not participate in regular exercise at present, Exercise limited by: orthopedic condition(s);neurologic condition(s)   Goals Addressed             This Visit's Progress    Stay Active and Independent-Low Back Pain       Why is this important?   Regular activity or exercise is important to managing back pain.  Activity helps to keep your muscles strong.  You will sleep better and feel more relaxed.  You will have more energy and feel less stressed.  If you are not active now, start slowly. Little changes make a big difference.  Rest, but not too much.  Stay as active as you can and listen to your body's signals.     Notes: Pt wish to stay active.       Depression Screen    09/10/2021   11:32 PM 03/08/2021   10:56 AM 10/20/2020    9:08 AM 09/16/2020    9:38 AM 03/03/2020    8:59 AM 09/19/2019    9:12 AM 08/20/2019   10:49 AM  PHQ 2/9 Scores  PHQ - 2 Score 1 2 4 2  0 0 2  PHQ- 9 Score 2 7 10 7  0 0 8    Fall Risk    11/08/2021    2:31 PM 03/08/2021   10:56 AM 10/20/2020    9:07 AM 09/16/2020    9:38 AM 09/19/2019    9:12 AM  Fall Risk   Falls in the past year? 1 1 1 1  0  Comment   lost balance, trips    Number falls in past yr: 0 1 1 1  0  Injury with Fall? 0 0 0 0 0  Risk for fall due to : Other (Comment) History of fall(s) History of fall(s);Impaired balance/gait;Medication side effect    Risk for fall due to: Comment tripped over something      Follow up Falls evaluation completed Falls evaluation completed Falls evaluation completed;Education provided;Falls prevention discussed Falls evaluation completed Falls evaluation completed    FALL RISK PREVENTION PERTAINING TO THE HOME:  Any stairs in or around the home? Yes  If so, are there any without handrails? No  Home free of loose throw rugs in walkways, pet beds, electrical cords, etc? Yes  Adequate lighting in your home to reduce risk of  falls? Yes   ASSISTIVE DEVICES UTILIZED TO PREVENT FALLS:  Life alert? No  Use of a cane, walker or w/c? Yes  Grab bars in the bathroom? No  Shower chair or bench in shower? Yes  Elevated toilet seat or a handicapped toilet? Yes   TIMED UP AND GO:  Was the test performed?  unable to preform, virtual appt  .  Cognitive Function:        11/08/2021    2:36 PM 10/20/2020    9:20 AM  6CIT Screen  What Year? 0 points 0 points  What month? 0 points 0 points  What time? 0 points 0 points  Count back from 20 0 points 0 points  Months in reverse 0 points 0 points  Repeat phrase 2 points 4 points  Total Score 2 points 4 points    Immunizations Immunization History  Administered Date(s) Administered   Fluad Quad(high Dose 65+) 03/03/2020   Influenza,  High Dose Seasonal PF 01/09/2018, 11/21/2018, 11/21/2018   Influenza, Seasonal, Injecte, Preservative Fre 12/16/2014   Moderna Sars-Covid-2 Vaccination 03/27/2019, 04/26/2019   PFIZER(Purple Top)SARS-COV-2 Vaccination 01/09/2020, 07/08/2020   Pneumococcal Conjugate-13 01/07/2018   Pneumococcal Polysaccharide-23 09/19/2019   Zoster Recombinat (Shingrix) 07/09/2020, 09/21/2020    TDAP status: Due, Education has been provided regarding the importance of this vaccine. Advised may receive this vaccine at local pharmacy or Health Dept. Aware to provide a copy of the vaccination record if obtained from local pharmacy or Health Dept. Verbalized acceptance and understanding.  Flu Vaccine status: Due, Education has been provided regarding the importance of this vaccine. Advised may receive this vaccine at local pharmacy or Health Dept. Aware to provide a copy of the vaccination record if obtained from local pharmacy or Health Dept. Verbalized acceptance and understanding.  Pneumococcal vaccine status: Up to date  Covid-19 vaccine status: Information provided on how to obtain vaccines.   Qualifies for Shingles Vaccine? Yes   Zostavax completed  Yes   Shingrix Completed?: Yes  Screening Tests Health Maintenance  Topic Date Due   TETANUS/TDAP  Never done   DEXA SCAN  Never done   OPHTHALMOLOGY EXAM  06/12/2020   COVID-19 Vaccine (5 - Moderna series) 09/02/2020   INFLUENZA VACCINE  09/28/2021   Hepatitis C Screening  09/17/2023 (Originally 01/08/1969)   COLONOSCOPY (Pts 45-29yrs Insurance coverage will need to be confirmed)  01/17/2022   HEMOGLOBIN A1C  03/10/2022   FOOT EXAM  09/11/2022   MAMMOGRAM  10/26/2023   Pneumonia Vaccine 52+ Years old  Completed   Zoster Vaccines- Shingrix  Completed   HPV VACCINES  Aged Out    Health Maintenance  Health Maintenance Due  Topic Date Due   TETANUS/TDAP  Never done   DEXA SCAN  Never done   OPHTHALMOLOGY EXAM  06/12/2020   COVID-19 Vaccine (5 - Moderna series) 09/02/2020   INFLUENZA VACCINE  09/28/2021    Colorectal cancer screening: Type of screening: Colonoscopy. Completed 01/17/2017. Repeat every 5 years  Mammogram status: Completed 10/05/2021. Repeat every year    Lung Cancer Screening: (Low Dose CT Chest recommended if Age 38-80 years, 30 pack-year currently smoking OR have quit w/in 15years.) does not qualify.    Additional Screening:  Hepatitis C Screening: does qualify; postponed   Vision Screening: Recommended annual ophthalmology exams for early detection of glaucoma and other disorders of the eye. Is the patient up to date with their annual eye exam?  No  Who is the provider or what is the name of the office in which the patient attends annual eye exams?  If pt is not established with a provider, would they like to be referred to a provider to establish care? Yes .   Dental Screening: Recommended annual dental exams for proper oral hygiene  Community Resource Referral / Chronic Care Management: CRR required this visit?  No   CCM required this visit?  No      Plan:     I have personally reviewed and noted the following in the patient's chart:    Medical and social history Use of alcohol, tobacco or illicit drugs  Current medications and supplements including opioid prescriptions. Patient is not currently taking opioid prescriptions. Functional ability and status Nutritional status Physical activity Advanced directives List of other physicians Hospitalizations, surgeries, and ER visits in previous 12 months Vitals Screenings to include cognitive, depression, and falls Referrals and appointments  In addition, I have reviewed and discussed with patient certain preventive  protocols, quality metrics, and best practice recommendations. A written personalized care plan for preventive services as well as general preventive health recommendations were provided to patient.     Janet [name] , Thank you for taking time to come for your Medicare Wellness Visit. I appreciate your ongoing commitment to your health goals. Please review the following plan we discussed and let me know if I can assist you in the future.   These are the goals we discussed:  Goals      Patient Stated     10/20/2020, no goals     Pharmacy Goals     Please check your home blood pressure, keep a log of the results and bring this with you to your medical appointments.  To appropriately check your blood pressure, make sure you do the following:  1) Avoid caffeine, exercise, or tobacco products for 30 minutes before checking. Empty your bladder. 2) Sit with your back supported in a flat-backed chair. Rest your arm on something flat (arm of the chair, table, etc). 3) Sit still with your feet flat on the floor, resting, for at least 5 minutes.  4) Check your blood pressure. Take 1-2 readings.  5) Write down these readings and bring with you to any provider appointments.   Make sure you take your blood pressure medications before you come to any office visit, even if you were asked to fast for labs.   Feel free to call me with any questions or concerns. I look  forward to our next call!   Wallace Cullens, PharmD, Para March, CPP Clinical Pharmacist [facility] [phone]     Stay Active and Independent-Low Back Pain     Why is this important?   Regular activity or exercise is important to managing back pain.  Activity helps to keep your muscles strong.  You will sleep better and feel more relaxed.  You will have more energy and feel less stressed.  If you are not active now, start slowly. Little changes make a big difference.  Rest, but not too much.  Stay as active as you can and listen to your body's signals.     Notes: Pt wish to stay active.        This is a list of the screening recommended for you and due dates:  Health Maintenance  Topic Date Due   Tetanus Vaccine  Never done   DEXA scan (bone density measurement)  Never done   Eye exam for diabetics  06/12/2020   COVID-19 Vaccine (5 - Moderna series) 09/02/2020   Flu Shot  09/28/2021   Hepatitis C Screening: USPSTF Recommendation to screen - Ages 18-79 yo.  09/17/2023*   Colon Cancer Screening  01/17/2022   Hemoglobin A1C  03/10/2022   Complete foot exam   09/11/2022   Mammogram  10/26/2023   Pneumonia Vaccine  Completed   Zoster (Shingles) Vaccine  Completed   HPV Vaccine  Aged Out  *Topic was postponed. The date shown is not the original due date.    Wilson Singer, CMA   11/08/2021   Nurse Notes: Approximately 30 minute Non-Face-to-face visit

## 2021-11-08 NOTE — Patient Instructions (Signed)

## 2021-11-09 ENCOUNTER — Encounter: Payer: Self-pay | Admitting: Neurosurgery

## 2021-11-09 DIAGNOSIS — I10 Essential (primary) hypertension: Secondary | ICD-10-CM | POA: Diagnosis not present

## 2021-11-09 DIAGNOSIS — E785 Hyperlipidemia, unspecified: Secondary | ICD-10-CM | POA: Diagnosis not present

## 2021-11-09 DIAGNOSIS — I739 Peripheral vascular disease, unspecified: Secondary | ICD-10-CM | POA: Diagnosis not present

## 2021-11-09 DIAGNOSIS — G4730 Sleep apnea, unspecified: Secondary | ICD-10-CM | POA: Diagnosis not present

## 2021-11-09 DIAGNOSIS — G2581 Restless legs syndrome: Secondary | ICD-10-CM | POA: Diagnosis not present

## 2021-11-09 DIAGNOSIS — Z981 Arthrodesis status: Secondary | ICD-10-CM | POA: Diagnosis not present

## 2021-11-09 DIAGNOSIS — Z4789 Encounter for other orthopedic aftercare: Secondary | ICD-10-CM | POA: Diagnosis not present

## 2021-11-09 DIAGNOSIS — Z7982 Long term (current) use of aspirin: Secondary | ICD-10-CM | POA: Diagnosis not present

## 2021-11-09 DIAGNOSIS — K219 Gastro-esophageal reflux disease without esophagitis: Secondary | ICD-10-CM | POA: Diagnosis not present

## 2021-11-10 MED ORDER — METHYLPREDNISOLONE 4 MG PO TBPK: ORAL_TABLET | ORAL | 0 refills | Status: DC

## 2021-11-10 NOTE — Telephone Encounter (Signed)
I spoke with Janet [name]. She feels the swelling and swallowing issues are not as bad as they were over the weekend. I explained red flags that warrant a call to our office and a visit to the ER. I also reminded her that she can call [phone] and ask to page the neurosurgeon on call if it is after hours. Rx for medrol dosepack sent. She is diabetic but is no longer on medication and does not check her sugars regularly anymore.  I spoke with Janet [name] [phone] at [facility] and updated her on the above and informed her it is OK to change visits to 2 x 2.

## 2021-11-10 NOTE — Telephone Encounter (Signed)
C4-7 ACDF on 11/03/21/ Elmer Bales calling from [place]  She would like Dr.Yarbrough to review the pictures she sent. The patient had more swelling over the weekend. She has difficulty swallowing, she is on a liquid diet, and she has to crush her medications.   She needs to change visits to 2 x 2. Patient is getting around pretty well. She is in the process of moving next Friday.  If Irving Burton does not answer you can leave a message on her secure v/m.

## 2021-11-12 DIAGNOSIS — H2513 Age-related nuclear cataract, bilateral: Secondary | ICD-10-CM | POA: Diagnosis not present

## 2021-11-16 DIAGNOSIS — I739 Peripheral vascular disease, unspecified: Secondary | ICD-10-CM | POA: Diagnosis not present

## 2021-11-16 DIAGNOSIS — G2581 Restless legs syndrome: Secondary | ICD-10-CM | POA: Diagnosis not present

## 2021-11-16 DIAGNOSIS — G4730 Sleep apnea, unspecified: Secondary | ICD-10-CM | POA: Diagnosis not present

## 2021-11-16 DIAGNOSIS — Z4789 Encounter for other orthopedic aftercare: Secondary | ICD-10-CM | POA: Diagnosis not present

## 2021-11-16 DIAGNOSIS — E785 Hyperlipidemia, unspecified: Secondary | ICD-10-CM | POA: Diagnosis not present

## 2021-11-16 DIAGNOSIS — Z981 Arthrodesis status: Secondary | ICD-10-CM | POA: Diagnosis not present

## 2021-11-16 DIAGNOSIS — Z7982 Long term (current) use of aspirin: Secondary | ICD-10-CM | POA: Diagnosis not present

## 2021-11-16 DIAGNOSIS — I10 Essential (primary) hypertension: Secondary | ICD-10-CM | POA: Diagnosis not present

## 2021-11-16 DIAGNOSIS — K219 Gastro-esophageal reflux disease without esophagitis: Secondary | ICD-10-CM | POA: Diagnosis not present

## 2021-11-16 NOTE — Progress Notes (Unsigned)
   REFERRING PHYSICIAN:  Meade Maw, [address] [place],  [place] [id number]  DOS: ACDF C4-C7 11/03/21  HISTORY OF PRESENT ILLNESS: Janet [name] is 2 weeks status post ACDF C4-C7. Given baclofen, celebrex, and oxycodone on discharge from the hospital.   She had some swelling at the incision and dose pack was called in last week. This is much better.    Her bilateral arm pain is better than it was preop. Still with some numbness and tingling in her finger tips. She is not taking any medications for her neck- no celebrex, baclofen, or oxycodone.   PHYSICAL EXAMINATION:  NEUROLOGICAL:  General: In no acute distress.   Awake, alert, oriented to person, place, and time.  Pupils equal round and reactive to light.  Facial tone is symmetric.  Tongue protrusion is midline.  There is no pronator drift.  Strength: Side Biceps Triceps Deltoid Interossei Grip Wrist Ext. Wrist Flex.  R 5 5 5 5 5 5 5   L 5 5 5 5 5 5 5    Incision c/d/I  Imaging:  Nothing new to review.   Assessment / Plan: Janet [name] is doing well s/p above surgery. Treatment options reviewed with patient and following plan made:   - We discussed activity escalation and I have advised the patient to lift up to 10 pounds until 6 weeks after surgery (until your follow up with Dr. Izora Ribas).   - Reviewed wound care.  - She is not taking any medications for her neck.  - Follow up as scheduled in 4 weeks and prn.   Advised to contact the office if any questions or concerns arise.   Geronimo Boot PA-C Dept of Neurosurgery

## 2021-11-18 ENCOUNTER — Encounter: Payer: Self-pay | Admitting: Orthopedic Surgery

## 2021-11-18 ENCOUNTER — Ambulatory Visit (INDEPENDENT_AMBULATORY_CARE_PROVIDER_SITE_OTHER): Payer: Medicare Other | Admitting: Orthopedic Surgery

## 2021-11-18 VITALS — BP 124/79 | HR 80 | Temp 97.8°F

## 2021-11-18 DIAGNOSIS — Z981 Arthrodesis status: Secondary | ICD-10-CM

## 2021-11-18 DIAGNOSIS — G2581 Restless legs syndrome: Secondary | ICD-10-CM | POA: Diagnosis not present

## 2021-11-18 DIAGNOSIS — E785 Hyperlipidemia, unspecified: Secondary | ICD-10-CM | POA: Diagnosis not present

## 2021-11-18 DIAGNOSIS — Z7982 Long term (current) use of aspirin: Secondary | ICD-10-CM | POA: Diagnosis not present

## 2021-11-18 DIAGNOSIS — Z09 Encounter for follow-up examination after completed treatment for conditions other than malignant neoplasm: Secondary | ICD-10-CM

## 2021-11-18 DIAGNOSIS — G4730 Sleep apnea, unspecified: Secondary | ICD-10-CM | POA: Diagnosis not present

## 2021-11-18 DIAGNOSIS — Z4789 Encounter for other orthopedic aftercare: Secondary | ICD-10-CM | POA: Diagnosis not present

## 2021-11-18 DIAGNOSIS — G959 Disease of spinal cord, unspecified: Secondary | ICD-10-CM

## 2021-11-18 DIAGNOSIS — K219 Gastro-esophageal reflux disease without esophagitis: Secondary | ICD-10-CM | POA: Diagnosis not present

## 2021-11-18 DIAGNOSIS — I739 Peripheral vascular disease, unspecified: Secondary | ICD-10-CM | POA: Diagnosis not present

## 2021-11-18 DIAGNOSIS — I10 Essential (primary) hypertension: Secondary | ICD-10-CM | POA: Diagnosis not present

## 2021-11-24 ENCOUNTER — Ambulatory Visit: Payer: Medicare Other | Admitting: Family Medicine

## 2021-11-24 ENCOUNTER — Telehealth: Payer: Self-pay | Admitting: *Deleted

## 2021-11-24 NOTE — Patient Outreach (Addendum)
  Care Coordination [facility] Note Transition Care Management Follow-up Telephone Call Date of discharge and from where: [facility] [phone] How have you been since you were released from the hospital? I am doing real good Any questions or concerns? No  Items Reviewed: Did the pt receive and understand the discharge instructions provided? Yes  Medications obtained and verified? Yes  Other? No  Any new allergies since your discharge? No  Dietary orders reviewed? No Do you have support at home? Yes   Home Care and Equipment/Supplies: Were home health services ordered? no If so, what is the name of the agency? N/a  Has the agency set up a time to come to the patient's home? not applicable Were any new equipment or medical supplies ordered?  No What is the name of the medical supply agency? N Were you able to get the supplies/equipment? no Do you have any questions related to the use of the equipment or supplies? No  Functional Questionnaire: (I = Independent and D = Dependent) ADLs: I  Bathing/Dressing- I  Meal Prep- I  Eating- I  Maintaining continence- I  Transferring/Ambulation- I  Managing Meds- I  Follow up appointments reviewed:  PCP Hospital f/u appt confirmed? No   Specialist Hospital f/u appt confirmed? Yes  Scheduled to see Geronimo Boot [phone] 10:30 . Are transportation arrangements needed? No  If their condition worsens, is the pt aware to call PCP or go to the Emergency Dept.? Yes Was the patient provided with contact information for the PCP's office or ED? Yes Was to pt encouraged to call back with questions or concerns? Yes  SDOH assessments and interventions completed:   Yes  Care Coordination Interventions Activated:  Yes   Care Coordination Interventions:  No Care Coordination interventions needed at this time.   Encounter Outcome:  Pt. Visit Completed    [facility] [phone]

## 2021-12-09 ENCOUNTER — Ambulatory Visit: Payer: Self-pay | Admitting: *Deleted

## 2021-12-09 ENCOUNTER — Other Ambulatory Visit: Payer: Self-pay | Admitting: *Deleted

## 2021-12-09 DIAGNOSIS — E1169 Type 2 diabetes mellitus with other specified complication: Secondary | ICD-10-CM

## 2021-12-09 NOTE — Telephone Encounter (Signed)
Requested medication (s) are due for refill today: unclear  Requested medication (s) are on the active medication list: yes  Last refill:  03/20/19  Future visit scheduled: no, seen 08/31/21  Notes to clinic:  Historical Provider, please assess.       Requested Prescriptions  Pending Prescriptions Disp Refills   atorvastatin (LIPITOR) 40 MG tablet      Sig: Take 1 tablet (40 mg total) by mouth at bedtime.     Cardiovascular:  Antilipid - Statins Failed - 12/09/2021 12:04 PM      Failed - Lipid Panel in normal range within the last 12 months    Cholesterol  Date Value Ref Range Status  09/07/2021 125 <200 mg/dL Final   LDL Cholesterol (Calc)  Date Value Ref Range Status  09/07/2021 58 mg/dL (calc) Final    Comment:    Reference range: <100 . Desirable range <100 mg/dL for primary prevention;   <70 mg/dL for patients with CHD or diabetic patients  with > or = 2 CHD risk factors. Marland Kitchen LDL-C is now calculated using the Martin-Hopkins  calculation, which is a validated novel method providing  better accuracy than the Friedewald equation in the  estimation of LDL-C.  Cresenciano Genre et al. Annamaria Helling. 6712;458(09): 2061-2068  (http://education.[facility].com/faq/FAQ164)    HDL  Date Value Ref Range Status  09/07/2021 54 > OR = 50 mg/dL Final   Triglycerides  Date Value Ref Range Status  09/07/2021 58 <150 mg/dL Final         Passed - Patient is not pregnant      Passed - Valid encounter within last 12 months    Recent Outpatient Visits           3 months ago Annual physical exam   [facility] Olin Hauser, DO   9 months ago Venous stasis dermatitis of left lower extremity   [place], DO   1 year ago Annual physical exam   [facility] Olin Hauser, DO   1 year ago Restless leg syndrome   [facility] [place], Devonne Doughty, DO   1 year ago Type 2  diabetes mellitus with other specified complication, without long-term current use of insulin [facility])   [place], Devonne Doughty, DO

## 2021-12-09 NOTE — Telephone Encounter (Signed)
Summary: discuss medications   Pt says that she has a lot of medications and she is unsure of which ones that she is suppose to be taking.   Pt would like to discuss further      Called patient to review medication on current medication list in chart. Patient reports she does not have or is taking medication nortriptyline (pamelor) 10 mg  and she is taking metoprolol tartrate 25 mg daily per cardiologist that is NOT on medication list. Reports she has been taking metoprolol for years due to pacemaker. Recommended if refills needed to call cardiology. Patient reports she has been taking klonopin 0.5 mg and has run out of medication and requesting refill. Historical provider noted. Patient would like to review medications with PCP. Offered appt 12/13/21 but patient can not make appt at this time due to picking up grandchild from school. Please advise.     Reason for Disposition  [1] Caller has URGENT medicine question about med that PCP or specialist prescribed AND [2] triager unable to answer question  Answer Assessment - Initial Assessment Questions 1. NAME of MEDICINE: "What medicine(s) are you calling about?"     Nortriptyline, klonopin, metoprolol tartrate 2. QUESTION: "What is your question?" (e.g., double dose of medicine, side effect)     What medication should I be taking some are on current med list and some are not ? 3. PRESCRIBER: "Who prescribed the medicine?" Reason: if prescribed by specialist, call should be referred to that group.     PCP, cardiologist and historical provider  4. SYMPTOMS: "Do you have any symptoms?" If Yes, ask: "What symptoms are you having?"  "How bad are the symptoms (e.g., mild, moderate, severe)     na 5. PREGNANCY:  "Is there any chance that you are pregnant?" "When was your last menstrual period?"     na  Protocols used: Medication Question Call-A-AH

## 2021-12-10 ENCOUNTER — Other Ambulatory Visit: Payer: Self-pay | Admitting: Family Medicine

## 2021-12-10 DIAGNOSIS — E1169 Type 2 diabetes mellitus with other specified complication: Secondary | ICD-10-CM

## 2021-12-10 MED ORDER — ATORVASTATIN CALCIUM 40 MG PO TABS: 40.0000 mg | ORAL_TABLET | Freq: Every day | ORAL | 3 refills | Status: DC

## 2021-12-13 ENCOUNTER — Other Ambulatory Visit: Payer: Self-pay

## 2021-12-13 DIAGNOSIS — Z981 Arthrodesis status: Secondary | ICD-10-CM

## 2021-12-14 ENCOUNTER — Ambulatory Visit (INDEPENDENT_AMBULATORY_CARE_PROVIDER_SITE_OTHER): Payer: Medicare Other | Admitting: Neurosurgery

## 2021-12-14 ENCOUNTER — Encounter: Payer: Self-pay | Admitting: Neurosurgery

## 2021-12-14 ENCOUNTER — Ambulatory Visit
Admission: RE | Admit: 2021-12-14 | Discharge: 2021-12-14 | Disposition: A | Discharge status: Home / Self Care | Payer: Medicare Other | Attending: *Deleted | Admitting: *Deleted

## 2021-12-14 ENCOUNTER — Ambulatory Visit
Admission: RE | Admit: 2021-12-14 | Discharge: 2021-12-14 | Disposition: A | Discharge status: Home / Self Care | Payer: Medicare Other | Source: Ambulatory Visit | Attending: Neurosurgery | Admitting: Neurosurgery

## 2021-12-14 ENCOUNTER — Telehealth: Payer: Self-pay

## 2021-12-14 ENCOUNTER — Ambulatory Visit
Admission: RE | Admit: 2021-12-14 | Discharge: 2021-12-14 | Disposition: A | Discharge status: Home / Self Care | Payer: Medicare Other | Attending: Neurosurgery | Admitting: Neurosurgery

## 2021-12-14 VITALS — BP 130/78 | Temp 98.2°F | Ht 62.0 in | Wt 220.0 lb

## 2021-12-14 DIAGNOSIS — G8929 Other chronic pain: Secondary | ICD-10-CM

## 2021-12-14 DIAGNOSIS — M5441 Lumbago with sciatica, right side: Secondary | ICD-10-CM | POA: Diagnosis not present

## 2021-12-14 DIAGNOSIS — I779 Disorder of arteries and arterioles, unspecified: Secondary | ICD-10-CM | POA: Diagnosis not present

## 2021-12-14 DIAGNOSIS — R1310 Dysphagia, unspecified: Secondary | ICD-10-CM | POA: Diagnosis not present

## 2021-12-14 DIAGNOSIS — M5442 Lumbago with sciatica, left side: Secondary | ICD-10-CM | POA: Insufficient documentation

## 2021-12-14 DIAGNOSIS — M4316 Spondylolisthesis, lumbar region: Secondary | ICD-10-CM | POA: Insufficient documentation

## 2021-12-14 DIAGNOSIS — M4322 Fusion of spine, cervical region: Secondary | ICD-10-CM | POA: Diagnosis not present

## 2021-12-14 DIAGNOSIS — Z09 Encounter for follow-up examination after completed treatment for conditions other than malignant neoplasm: Secondary | ICD-10-CM

## 2021-12-14 DIAGNOSIS — M8588 Other specified disorders of bone density and structure, other site: Secondary | ICD-10-CM | POA: Diagnosis not present

## 2021-12-14 DIAGNOSIS — M4310 Spondylolisthesis, site unspecified: Secondary | ICD-10-CM

## 2021-12-14 DIAGNOSIS — Z981 Arthrodesis status: Secondary | ICD-10-CM

## 2021-12-14 DIAGNOSIS — M545 Low back pain, unspecified: Secondary | ICD-10-CM | POA: Diagnosis not present

## 2021-12-14 DIAGNOSIS — I6521 Occlusion and stenosis of right carotid artery: Secondary | ICD-10-CM | POA: Diagnosis not present

## 2021-12-14 NOTE — Progress Notes (Signed)
Referring Physician:  [name], MD [address] [place],  [place] [address]  Primary Physician:  Smitty Cords, DO  DOS: ACDF C4-C7 11/03/21   History of Present Illness: 12/14/2021 [name] is doing very well from her neck surgery.  She is very happy with her improvements.  She is still having substantial difficulty with her back and legs.  10/21/2021 [name] is here today with a chief complaint of back and leg pain.  It has worsened over time.  Activity and working on her feet bother her.  She can walk 5 minutes before she has to stop due to leg weakness and discomfort in her back and legs.   She is also having pain down her arms.  She gets numbness and tingling in her hands.  She has had issues with her balance, and has had some falls.  She is walking very slowly to try to keep from falling.    06/11/2020 [name] is here today with a chief complaint of low back pain and left lateral leg pain and numbness. She reports bilateral leg weakness for the past 2 years.  She has had back trouble for several years. She previously underwent a course of conservative management several years ago, and it helped her significantly. She now has worsening symptoms and has significant trouble with pain down her left leg when she walks or stands. She has to walk very slowly, and can only walk approximately 15 minutes. Her pain gets worse as the day goes on and she ultimately feels that she is bent forward.  Bowel/Bladder Dysfunction: none  Conservative measures:  Physical therapy: has not participated Multimodal medical therapy including regular antiinflammatories: baclofen, gabapentin,  Injections: has received epidural steroid injections about 3 years ago in [place], [place]  Past Surgery: none  Janet [name] has no symptoms of cervical myelopathy.  The symptoms are causing a significant impact on the patient's life.   Review of Systems:  A 10  point review of systems is negative, except for the pertinent positives and negatives detailed in the HPI.  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 Jude device   Restless leg    RLS (restless legs syndrome)    a.) takes pramipexole   Sleep apnea    SOB (shortness of breath)    T2DM (type 2 diabetes mellitus) (HCC)    Urinary incontinence    Venous insufficiency of both lower extremities     Past Surgical History: Past Surgical History:  Procedure Laterality Date   ANTERIOR CERVICAL DECOMP/DISCECTOMY FUSION N/A 11/03/2021   Procedure: C4-7 ANTERIOR CERVICAL DISCECTOMY AND FUSION (GLOBUS FORGE);  Surgeon: [name], MD;  Location: [facility] ORS;  Service: Neurosurgery;  Laterality: N/A;   APPENDECTOMY  2014  CARPAL TUNNEL RELEASE Left 1998   ESOPHAGEAL DILATION  2012   2015, 2017, 2020   GASTRIC BYPASS  2019   HERNIA REPAIR  2019   left knee manipulation Left 02/05/2019   PACEMAKER IMPLANT  05/2015   Procedure: PACEMAKER INSERTION; Location: [place], [place]; Surgeon: Maryjean Ka, MD   POLYPECTOMY     SHOULDER SURGERY Left 2014   SHOULDER SURGERY Right 2017   TOTAL KNEE ARTHROPLASTY Right 2018   TOTAL KNEE ARTHROPLASTY Left 10/18/2018   vein removal Bilateral 2012    Allergies: Allergies as of 12/14/2021   (No Known Allergies)    Medications: Current Meds  Medication Sig   aspirin 81  MG EC tablet Take 81 mg by mouth daily.   atorvastatin (LIPITOR) 40 MG tablet Take 1 tablet (40 mg total) by mouth at bedtime.   clonazePAM (KLONOPIN) 0.5 MG tablet Take 0.5 tablets by mouth in the morning and at bedtime.   DULoxetine (CYMBALTA) 30 MG capsule TAKE 1 CAPSULE(30 MG) BY MOUTH DAILY   gabapentin (NEURONTIN) 300 MG capsule Take 300 mg by mouth 2 (two) times daily.   losartan-hydrochlorothiazide (HYZAAR) 100-25 MG tablet TAKE 1 TABLET BY MOUTH DAILY   metoprolol tartrate (LOPRESSOR) 25 MG tablet Take 25 mg by mouth daily.   nortriptyline (PAMELOR) 10 MG capsule Take 2 mg by mouth 2 (two) times daily.   pramipexole (MIRAPEX) 1 MG tablet Take 1 tablet (1 mg total) by mouth 2 (two) times daily.    Social History: Social History   Tobacco Use   Smoking status: Former    Packs/day: 1.00    Years: 25.00    Total pack years: 25.00    Types: Cigarettes    Quit date: 2007    Years since quitting: 16.8   Smokeless tobacco: Former  Building services engineer Use: Never used  Substance Use Topics   Alcohol use: Yes    Comment: very rarely   Drug use: Never    Family Medical History: Family History  Problem Relation Age of Onset   Heart attack Mother 33   Heart disease Father    Thyroid disease Father    Skin cancer Father    Brain cancer Maternal Grandmother     Physical Examination: Vitals:   12/14/21 1111  BP: 130/78  Temp: 98.2 F (36.8 C)    General: Patient is well developed, well nourished, calm, collected, and in no apparent distress. Attention to examination is appropriate.  Neck:   Supple.  Full range of motion.  Respiratory: Patient is breathing without any difficulty.   NEUROLOGICAL:     Awake, alert, oriented to person, place, and time.  Speech is clear and fluent. Fund of knowledge is appropriate.   Cranial Nerves: Pupils equal round and reactive to light.  Facial tone is symmetric.  Facial sensation is symmetric. Shoulder shrug is symmetric. Tongue  protrusion is midline.  There is no pronator drift.  ROM of spine: full.    Strength: Side Biceps Triceps Deltoid Interossei Grip Wrist Ext. Wrist Flex.  R 5 5 5  5+ 5+ 5 5  L 5 5 5 5 5 5 5    Side Iliopsoas Quads Hamstring PF DF EHL  R 5 5 5 5 5 5   L 5 5 5 5 5 5    Reflexes are 1+ and symmetric at the biceps, triceps, brachioradialis, patella and achilles.   Hoffman's is present.   Bilateral upper and lower extremity sensation is intact to light touch.  No evidence of dysmetria noted.  Gait is slowed and wide-based.    Medical Decision Making  Imaging: MRI CL spine 09/30/21 L3-L4: Grade 1 anterolisthesis. Uncovering the disc and superimposed disc bulging. Ligamentum flavum thickening and facet arthropathy. Slight progression of severe canal stenosis. Mild bilateral foraminal stenosis.   L4-L5: Grade 1 anterolisthesis. Uncovering the disc with superimposed disc bulging. Ligamentum flavum thickening and facet arthropathy. Slight progression of severe canal stenosis. Mild bilateral foraminal stenosis.   L5-S1: Facet arthropathy without significant stenosis.   IMPRESSION: Slight progression of severe canal stenosis at L3-L4 and L4-L5.     Electronically Signed   By: Margaretha Sheffield M.D.   On: 09/30/2021 15:18  IMPRESSION: 1. At C4-C5, moderate to severe canal stenosis and severe bilateral foraminal stenosis. 2. At C5-C6, moderate canal stenosis and moderate bilateral foraminal stenosis. 3. At C6-C7, severe left and moderate right foraminal stenosis with mild canal stenosis.     Electronically Signed   By: Margaretha Sheffield M.D.   On: 09/30/2021 15:14  Lumbar spine flexion-extension films from December 14, 2021 show 2 mm and 6 mm anterolisthesis of L3 on L4 and L4 on L5, respectively, and the extension view that increases to 6 mm and 10 mm at the same levels on flexion.  I have personally reviewed the images and agree with the above interpretation.  Assessment  and Plan: [name] is a pleasant 71 y.o. female with symptoms of cervical stenosis with cervical myelopathy as well as lumbar stenosis and back pain due to anterolisthesis of L3-4 and L4-5.  She has substantial change in her anterolisthesis when she performs flexion extension.  She has had surgery for her neck and now presents to discuss potential options for her lower back.  She has tried physical therapy and injections.  She is having worsening function of her arms and legs.  At this point, I do not think that further conservative management is indicated.  I have recommended L3-5 lateral lumbar interbody fusion with posterior fixation and fusion.  I discussed the planned procedure at length with the patient, including the risks, benefits, alternatives, and indications. The risks discussed include but are not limited to bleeding, infection, need for reoperation, spinal fluid leak, stroke, vision loss, anesthetic complication, coma, paralysis, and even death. I also described the possibility of psoas weakness and paresthesias. I described in detail that improvement was not guaranteed.  The patient expressed understanding of these risks, and asked that we proceed with surgery. I described the surgery in layman's terms, and gave ample opportunity for questions, which were answered to the best of my ability.    I spent a total of 10 minutes in face-to-face and non-face-to-face activities related to this patient's care today.  Thank you for involving me in the care of this patient.      Jamaira Sherk K. Izora Ribas MD, [facility]

## 2021-12-14 NOTE — Telephone Encounter (Signed)
I spoke with Janet [name]. She would like to schedule surgery on 01/05/22. I sent a mychart message with a recap of our discussion.

## 2021-12-15 ENCOUNTER — Other Ambulatory Visit: Payer: Self-pay

## 2021-12-15 DIAGNOSIS — Z01818 Encounter for other preprocedural examination: Secondary | ICD-10-CM

## 2021-12-16 ENCOUNTER — Encounter: Payer: Medicare Other | Admitting: Neurosurgery

## 2021-12-24 ENCOUNTER — Encounter
Admission: RE | Admit: 2021-12-24 | Discharge: 2021-12-24 | Disposition: A | Discharge status: Home / Self Care | Payer: Medicare Other | Source: Ambulatory Visit | Attending: Neurosurgery | Admitting: Neurosurgery

## 2021-12-24 VITALS — BP 129/76 | HR 61 | Temp 98.0°F | Resp 20 | Ht 62.0 in | Wt 216.2 lb

## 2021-12-24 DIAGNOSIS — R011 Cardiac murmur, unspecified: Secondary | ICD-10-CM | POA: Diagnosis not present

## 2021-12-24 DIAGNOSIS — E785 Hyperlipidemia, unspecified: Secondary | ICD-10-CM | POA: Insufficient documentation

## 2021-12-24 DIAGNOSIS — M4802 Spinal stenosis, cervical region: Secondary | ICD-10-CM | POA: Diagnosis not present

## 2021-12-24 DIAGNOSIS — Z01818 Encounter for other preprocedural examination: Secondary | ICD-10-CM | POA: Insufficient documentation

## 2021-12-24 DIAGNOSIS — Z950 Presence of cardiac pacemaker: Secondary | ICD-10-CM | POA: Diagnosis not present

## 2021-12-24 DIAGNOSIS — E1169 Type 2 diabetes mellitus with other specified complication: Secondary | ICD-10-CM

## 2021-12-24 DIAGNOSIS — Z01812 Encounter for preprocedural laboratory examination: Secondary | ICD-10-CM

## 2021-12-24 HISTORY — DX: Nausea with vomiting, unspecified: R11.2

## 2021-12-24 HISTORY — DX: Other specified postprocedural states: Z98.890

## 2021-12-24 LAB — URINALYSIS, ROUTINE W REFLEX MICROSCOPIC
Bacteria, UA: NONE SEEN
Bilirubin Urine: NEGATIVE
Glucose, UA: NEGATIVE mg/dL
Hgb urine dipstick: NEGATIVE
Ketones, ur: NEGATIVE mg/dL
Nitrite: NEGATIVE
Protein, ur: NEGATIVE mg/dL
Specific Gravity, Urine: 1.015 (ref 1.005–1.030)
pH: 5 (ref 5.0–8.0)

## 2021-12-24 LAB — CBC
HCT: 40.7 % (ref 36.0–46.0)
Hemoglobin: 12.7 g/dL (ref 12.0–15.0)
MCH: 29.5 pg (ref 26.0–34.0)
MCHC: 31.2 g/dL (ref 30.0–36.0)
MCV: 94.4 fL (ref 80.0–100.0)
Platelets: 284 10*3/uL (ref 150–400)
RBC: 4.31 MIL/uL (ref 3.87–5.11)
RDW: 14.2 % (ref 11.5–15.5)
WBC: 8.2 10*3/uL (ref 4.0–10.5)
nRBC: 0 % (ref 0.0–0.2)

## 2021-12-24 LAB — BASIC METABOLIC PANEL
Anion gap: 4 — ABNORMAL LOW (ref 5–15)
BUN: 20 mg/dL (ref 8–23)
CO2: 28 mmol/L (ref 22–32)
Calcium: 8.6 mg/dL — ABNORMAL LOW (ref 8.9–10.3)
Chloride: 106 mmol/L (ref 98–111)
Creatinine, Ser: 0.58 mg/dL (ref 0.44–1.00)
GFR, Estimated: >60 mL/min (ref >60)
Glucose, Bld: 103 mg/dL — ABNORMAL HIGH (ref 70–99)
Potassium: 3.6 mmol/L (ref 3.5–5.1)
Sodium: 138 mmol/L (ref 135–145)

## 2021-12-24 LAB — SURGICAL PCR SCREEN
MRSA, PCR: NEGATIVE
Staphylococcus aureus: NEGATIVE

## 2021-12-24 LAB — TYPE AND SCREEN
ABO/RH(D): A POS
Antibody Screen: NEGATIVE

## 2021-12-24 NOTE — Patient Instructions (Signed)
Your procedure is scheduled on: Wednesday January 05, 2022. Su procedimiento est programado para: [place] del 2023. Report to Day Surgery inside [facility] 2nd floor, stop by registration desk before getting on elevator.  Presntese a: Science writer del [facility] 2ndo piso, registre antes de subir al [employer]. To find out your arrival time please call [phone] between 1PM - 3PM on Tuesday January 04, 2022. Para saber su hora de llegada por favor llame al [phone] entre la 1PM - [place]: [place] [address] [place] del 2023.  Remember: Instructions that are not followed completely may result in serious medical risk, up to and including death,  or upon the discretion of your surgeon and anesthesiologist your surgery may need to be rescheduled.  Recuerde: Las instrucciones que no se siguen completamente Heritage manager en un riesgo de salud grave, incluyendo hasta  la [place] o a discrecin de su cirujano y Environmental health practitioner, su ciruga se puede posponer.   __X_ 1.Do not eat food after midnight the night before your procedure. No    gum chewing or hard candies. You may drink clear liquids up to 2 hours     before you are scheduled to arrive for your surgery- DO not drink clear     Liquids within 2 hours of the start of your surgery.     Clear Liquids include:    Water    No coma ni beba nada despus de la medianoche de la noche anterior a su    procedimiento. No coma chicles ni caramelos duros.   Los lquidos claros incluyen:          Janet [name]               _X__ 2.Do Not Smoke or use e-cigarettes For 24 Hours Prior to Your Surgery.    Do not use any chewable tobacco products for at least 6   hours prior to surgery.    No fume ni use cigarrillos electrnicos durante las 24 horas previas    a su [place].  No use ningn producto de tabaco masticable durante   al menos 6 horas antes de la [place].     __X_ 3. No alcohol for 24 hours before or after  surgery.    No tome alcohol durante las 24 horas antes ni despus de la [place].   __X__4. On the morning of surgery brush your teeth with toothpaste and water, you                may rinse your mouth with mouthwash if you wish.  Do not swallow any toothpaste of mouthwash.   En la maana de la [place], cepllese los dientes con pasta de dientes y [place],                [place] enjuagarse la boca con enjuague bucal si lo desea. No ingiera ninguna pasta de dientes o enjuague bucal.   __X__ 5. Notify your doctor if there is any change in your medical condition (cold,fever, infections).    Informe a su mdico si hay algn cambio en su condicin mdica  (resfriado, fiebre, infecciones).   Do not wear jewelry, make-up, hairpins, clips or nail polish.  No use joyas, maquillajes, pinzas/ganchos para el cabello ni esmalte de uas.  Do not wear lotions, powders, or perfumes. You may wear deodorant.  No use lociones, polvos o perfumes.  Puede usar desodorante.    Do not shave 48 hours prior to  surgery. Men may shave face and neck.  No se afeite 48 horas antes de la [place].  Los hombres pueden [employer] cara  y el cuello.   Do not bring valuables to the hospital.   No lleve objetos [facility] is not responsible for any belongings or valuables.  [facility] no se hace responsable de ningn tipo de pertenencias u objetos de Geographical information systems officer.               Contacts, dentures or bridgework may not be worn into surgery.  Los lentes de [place], las dentaduras postizas o puentes no se pueden usar en la [place].   Leave your suitcase in the car. After surgery it may be brought to your room.  Deje su maleta en el auto.  Despus de la ciruga podr traerla a su habitacin.   For patients admitted to the hospital, discharge time is determined by your  treatment team.  Para los pacientes que sean ingresados al hospital, el tiempo en el cual se le  dar de alta es determinado por su equipo de  [place].   Patients discharged the day of surgery will not be allowed to drive home. A los pacientes que se les da de alta el mismo da de la ciruga no se les permitir conducir a Holiday representative.   __X__ Take these medicines the morning of surgery with A SIP OF WATER:          [employer] estas medicinas la maana de la ciruga con UN SORBO DE AGUA:  1. DULoxetine (CYMBALTA) 30 MG   2. gabapentin (NEURONTIN) 300 MG  3. pramipexole (MIRAPEX) 1 MG  4.       5.  6.  ____ Fleet Enema (as directed)          Enema de Fleet (segn lo indicado)    __X__ Use CHG Soap as directed          Utilice el jabn de CHG segn lo indicado  ____ Use inhalers on the day of surgery          Use los inhaladores el da de la ciruga  ____ Stop metformin 2 days prior to surgery          Deje de tomar el metformin 2 das antes de la ciruga    ____ Take 1/2 of usual insulin dose the night before surgery and none on the morning of surgery           Tome la mitad de la dosis habitual de insulina la noche antes de la [place] y no tome nada en la maana de la             ciruga  __X__ Stop Anti-inflammatories such as Ibuprofen, Aleve, Advil, Motrin, Naprosyn, Meloxicam, Lodine, Ketoralac, Midol, and aspirin containing products like Excedrin, Goody's and or BC Powders.          Deje de tomar antiinflamatorios como Ibuprofen, Aleve, Advil, Motrin, Naprosyn, Meloxicam, Lodine, Ketoralac, Midol, o productos con aspirina como Excedrin, Goody's and or BC Powders.   __X__ Stop supplements until after surgery            Deje de tomar suplementos hasta despus de la ciruga  ____ Bring C-Pap to the hospital          [place] al hospital     If you have any questions regarding your pre-procedure instructions,  Please call Pre-admit Testing at [phone]  Si tiene alguna  pregunta con respecto a las instrucciones previas al procedimiento, Llame a Pruebas previas a la admisin al [phone]

## 2021-12-27 ENCOUNTER — Encounter (INDEPENDENT_AMBULATORY_CARE_PROVIDER_SITE_OTHER): Payer: Self-pay

## 2021-12-30 ENCOUNTER — Encounter: Payer: Self-pay | Admitting: Neurosurgery

## 2022-01-03 ENCOUNTER — Telehealth: Payer: Self-pay

## 2022-01-03 NOTE — Telephone Encounter (Signed)
I spoke with Ms Janet [name]. She will take another test next week. I have rescheduled her surgery to 02/14/22. If she is negative next week, she will contact our office to move to a sooner surgery date.

## 2022-01-03 NOTE — Telephone Encounter (Signed)
Janet [name] said she mentioned that her daughter lives with her... Dr Izora Ribas, do you want me to advise her to retest later this week and postpone 6 weeks just in case?

## 2022-01-03 NOTE — Telephone Encounter (Signed)
-----   Message from Peggyann Shoals sent at 01/03/2022 10:43 AM EST ----- Regarding: exposed to covid Contact: [phone] L3-5 XLIF/PSF on 01/05/22 Daughter tested positive for covid this morning. She took a test and it was negative no symptoms. What should she do?

## 2022-01-03 NOTE — Telephone Encounter (Signed)
Error

## 2022-01-10 ENCOUNTER — Telehealth: Payer: Self-pay

## 2022-01-10 NOTE — Telephone Encounter (Signed)
Surgery has been moved up to 01/26/22

## 2022-01-10 NOTE — Telephone Encounter (Signed)
-----   Message from Rockey Situ sent at 01/10/2022  9:57 AM EST ----- Regarding: move sx up Contact: [phone] L3-5 XLIF/PSF  Patient called and said that she tested negative for covid this morning. She has decided to move her appt up to 01/26/2022. All her post-op appts have been rescheduled also.

## 2022-01-18 ENCOUNTER — Encounter: Payer: Medicare Other | Admitting: Orthopedic Surgery

## 2022-01-19 ENCOUNTER — Inpatient Hospital Stay: Admission: RE | Admit: 2022-01-19 | Payer: Medicare Other | Source: Ambulatory Visit

## 2022-01-25 DIAGNOSIS — R200 Anesthesia of skin: Secondary | ICD-10-CM | POA: Diagnosis not present

## 2022-01-25 DIAGNOSIS — G2581 Restless legs syndrome: Secondary | ICD-10-CM | POA: Diagnosis not present

## 2022-01-25 DIAGNOSIS — M79641 Pain in right hand: Secondary | ICD-10-CM | POA: Diagnosis not present

## 2022-01-25 DIAGNOSIS — M79642 Pain in left hand: Secondary | ICD-10-CM | POA: Diagnosis not present

## 2022-01-25 DIAGNOSIS — E1169 Type 2 diabetes mellitus with other specified complication: Secondary | ICD-10-CM | POA: Diagnosis not present

## 2022-01-25 DIAGNOSIS — M79601 Pain in right arm: Secondary | ICD-10-CM | POA: Diagnosis not present

## 2022-01-25 DIAGNOSIS — M48061 Spinal stenosis, lumbar region without neurogenic claudication: Secondary | ICD-10-CM | POA: Diagnosis not present

## 2022-01-25 DIAGNOSIS — G629 Polyneuropathy, unspecified: Secondary | ICD-10-CM | POA: Diagnosis not present

## 2022-01-25 DIAGNOSIS — R202 Paresthesia of skin: Secondary | ICD-10-CM | POA: Diagnosis not present

## 2022-01-25 DIAGNOSIS — R29898 Other symptoms and signs involving the musculoskeletal system: Secondary | ICD-10-CM | POA: Diagnosis not present

## 2022-01-26 ENCOUNTER — Other Ambulatory Visit: Payer: Self-pay

## 2022-01-26 ENCOUNTER — Inpatient Hospital Stay
Admission: RE | Disposition: A | Discharge status: Home Health | Payer: Self-pay | Source: Home / Self Care | Attending: Neurosurgery

## 2022-01-26 ENCOUNTER — Inpatient Hospital Stay: Payer: Medicare Other | Admitting: Urgent Care

## 2022-01-26 ENCOUNTER — Encounter: Payer: Self-pay | Admitting: Neurosurgery

## 2022-01-26 ENCOUNTER — Inpatient Hospital Stay: Payer: Medicare Other

## 2022-01-26 ENCOUNTER — Inpatient Hospital Stay
Admission: RE | Admit: 2022-01-26 | Discharge: 2022-01-29 | DRG: 455 | Disposition: A | Discharge status: Home Health | Payer: Medicare Other | Attending: Neurosurgery | Admitting: Neurosurgery

## 2022-01-26 DIAGNOSIS — Z8249 Family history of ischemic heart disease and other diseases of the circulatory system: Secondary | ICD-10-CM | POA: Diagnosis not present

## 2022-01-26 DIAGNOSIS — M5442 Lumbago with sciatica, left side: Secondary | ICD-10-CM | POA: Diagnosis not present

## 2022-01-26 DIAGNOSIS — K219 Gastro-esophageal reflux disease without esophagitis: Secondary | ICD-10-CM | POA: Diagnosis not present

## 2022-01-26 DIAGNOSIS — Z950 Presence of cardiac pacemaker: Secondary | ICD-10-CM

## 2022-01-26 DIAGNOSIS — M4807 Spinal stenosis, lumbosacral region: Secondary | ICD-10-CM | POA: Diagnosis present

## 2022-01-26 DIAGNOSIS — Z01810 Encounter for preprocedural cardiovascular examination: Secondary | ICD-10-CM | POA: Diagnosis not present

## 2022-01-26 DIAGNOSIS — Z96653 Presence of artificial knee joint, bilateral: Secondary | ICD-10-CM | POA: Diagnosis present

## 2022-01-26 DIAGNOSIS — M5441 Lumbago with sciatica, right side: Secondary | ICD-10-CM | POA: Diagnosis not present

## 2022-01-26 DIAGNOSIS — E1169 Type 2 diabetes mellitus with other specified complication: Secondary | ICD-10-CM

## 2022-01-26 DIAGNOSIS — E1151 Type 2 diabetes mellitus with diabetic peripheral angiopathy without gangrene: Secondary | ICD-10-CM | POA: Diagnosis not present

## 2022-01-26 DIAGNOSIS — G4730 Sleep apnea, unspecified: Secondary | ICD-10-CM | POA: Diagnosis not present

## 2022-01-26 DIAGNOSIS — G2581 Restless legs syndrome: Secondary | ICD-10-CM | POA: Diagnosis present

## 2022-01-26 DIAGNOSIS — I10 Essential (primary) hypertension: Secondary | ICD-10-CM | POA: Diagnosis not present

## 2022-01-26 DIAGNOSIS — M4316 Spondylolisthesis, lumbar region: Secondary | ICD-10-CM | POA: Diagnosis not present

## 2022-01-26 DIAGNOSIS — G8929 Other chronic pain: Secondary | ICD-10-CM | POA: Diagnosis present

## 2022-01-26 DIAGNOSIS — M810 Age-related osteoporosis without current pathological fracture: Secondary | ICD-10-CM | POA: Diagnosis present

## 2022-01-26 DIAGNOSIS — Z8349 Family history of other endocrine, nutritional and metabolic diseases: Secondary | ICD-10-CM

## 2022-01-26 DIAGNOSIS — F32A Depression, unspecified: Secondary | ICD-10-CM | POA: Diagnosis not present

## 2022-01-26 DIAGNOSIS — F419 Anxiety disorder, unspecified: Secondary | ICD-10-CM | POA: Diagnosis present

## 2022-01-26 DIAGNOSIS — Z808 Family history of malignant neoplasm of other organs or systems: Secondary | ICD-10-CM

## 2022-01-26 DIAGNOSIS — E785 Hyperlipidemia, unspecified: Secondary | ICD-10-CM | POA: Diagnosis not present

## 2022-01-26 DIAGNOSIS — Z7982 Long term (current) use of aspirin: Secondary | ICD-10-CM

## 2022-01-26 DIAGNOSIS — M48061 Spinal stenosis, lumbar region without neurogenic claudication: Secondary | ICD-10-CM | POA: Diagnosis present

## 2022-01-26 DIAGNOSIS — Z87891 Personal history of nicotine dependence: Secondary | ICD-10-CM | POA: Diagnosis not present

## 2022-01-26 DIAGNOSIS — Z79899 Other long term (current) drug therapy: Secondary | ICD-10-CM | POA: Diagnosis not present

## 2022-01-26 DIAGNOSIS — Z01818 Encounter for other preprocedural examination: Secondary | ICD-10-CM

## 2022-01-26 DIAGNOSIS — G4700 Insomnia, unspecified: Secondary | ICD-10-CM | POA: Diagnosis present

## 2022-01-26 DIAGNOSIS — Z981 Arthrodesis status: Secondary | ICD-10-CM | POA: Diagnosis not present

## 2022-01-26 DIAGNOSIS — M4727 Other spondylosis with radiculopathy, lumbosacral region: Secondary | ICD-10-CM | POA: Diagnosis not present

## 2022-01-26 HISTORY — PX: ANTERIOR LATERAL LUMBAR FUSION WITH PERCUTANEOUS SCREW 2 LEVEL: SHX5554

## 2022-01-26 HISTORY — PX: APPLICATION OF INTRAOPERATIVE CT SCAN: SHX6668

## 2022-01-26 LAB — GLUCOSE, CAPILLARY
Glucose-Capillary: 79 mg/dL (ref 70–99)
Glucose-Capillary: 123 mg/dL — ABNORMAL HIGH (ref 70–99)

## 2022-01-26 LAB — TYPE AND SCREEN
ABO/RH(D): A POS
Antibody Screen: NEGATIVE

## 2022-01-26 SURGERY — ANTERIOR LATERAL LUMBAR FUSION WITH PERCUTANEOUS SCREW 2 LEVEL
Anesthesia: General | Site: Spine Lumbar

## 2022-01-26 MED ORDER — ONDANSETRON HCL 4 MG PO TABS: 4.0000 mg | ORAL_TABLET | Freq: Four times a day (QID) | ORAL | Status: DC | PRN

## 2022-01-26 MED ORDER — METHOCARBAMOL 500 MG PO TABS
500.0000 mg | ORAL_TABLET | Freq: Four times a day (QID) | ORAL | Status: DC | PRN
  Administered 2022-01-26 – 2022-01-29 (×4): 500 mg via ORAL
  Filled 2022-01-26: qty 1

## 2022-01-26 MED ORDER — MIDAZOLAM HCL 2 MG/2ML IJ SOLN
INTRAMUSCULAR | Status: DC | PRN
  Administered 2022-01-26: 2 mg via INTRAVENOUS

## 2022-01-26 MED ORDER — ONDANSETRON HCL 4 MG/2ML IJ SOLN
INTRAMUSCULAR | Status: DC | PRN
  Administered 2022-01-26: 4 mg via INTRAVENOUS

## 2022-01-26 MED ORDER — PROPOFOL 500 MG/50ML IV EMUL
INTRAVENOUS | Status: DC | PRN
  Administered 2022-01-26: 20 ug/kg/min via INTRAVENOUS

## 2022-01-26 MED ORDER — PHENYLEPHRINE HCL-NACL 20-0.9 MG/250ML-% IV SOLN
INTRAVENOUS | Status: DC | PRN
  Administered 2022-01-26: 20 ug/min via INTRAVENOUS

## 2022-01-26 MED ORDER — POTASSIUM CHLORIDE IN NACL 20-0.9 MEQ/L-% IV SOLN
INTRAVENOUS | Status: DC
  Filled 2022-01-26 (×9): qty 1000

## 2022-01-26 MED ORDER — KETOROLAC TROMETHAMINE 30 MG/ML IJ SOLN
INTRAMUSCULAR | Status: AC
  Filled 2022-01-26: qty 1

## 2022-01-26 MED ORDER — MENTHOL 3 MG MT LOZG: 1.0000 | LOZENGE | OROMUCOSAL | Status: DC | PRN

## 2022-01-26 MED ORDER — SODIUM CHLORIDE 0.9% FLUSH: 3.0000 mL | INTRAVENOUS | Status: DC | PRN

## 2022-01-26 MED ORDER — CLONAZEPAM 0.5 MG PO TABS
0.5000 mg | ORAL_TABLET | Freq: Two times a day (BID) | ORAL | Status: DC
  Administered 2022-01-26 – 2022-01-28 (×4): 0.5 mg via ORAL
  Filled 2022-01-26 (×6): qty 1

## 2022-01-26 MED ORDER — SUCCINYLCHOLINE CHLORIDE 200 MG/10ML IV SOSY
PREFILLED_SYRINGE | INTRAVENOUS | Status: DC | PRN
  Administered 2022-01-26: 100 mg via INTRAVENOUS

## 2022-01-26 MED ORDER — HYDROMORPHONE HCL 1 MG/ML IJ SOLN
INTRAMUSCULAR | Status: AC
  Filled 2022-01-26: qty 1

## 2022-01-26 MED ORDER — CHLORHEXIDINE GLUCONATE 0.12 % MT SOLN: 15.0000 mL | Freq: Once | OROMUCOSAL | Status: AC

## 2022-01-26 MED ORDER — ACETAMINOPHEN 10 MG/ML IV SOLN
INTRAVENOUS | Status: AC
  Filled 2022-01-26: qty 100

## 2022-01-26 MED ORDER — OXYCODONE HCL 5 MG PO TABS: 5.0000 mg | ORAL_TABLET | Freq: Once | ORAL | Status: DC | PRN

## 2022-01-26 MED ORDER — HYDROCHLOROTHIAZIDE 25 MG PO TABS
25.0000 mg | ORAL_TABLET | Freq: Every day | ORAL | Status: DC
  Administered 2022-01-26 – 2022-01-29 (×3): 25 mg via ORAL
  Filled 2022-01-26 (×2): qty 1

## 2022-01-26 MED ORDER — LOSARTAN POTASSIUM 50 MG PO TABS
ORAL_TABLET | ORAL | Status: AC
  Filled 2022-01-26: qty 2

## 2022-01-26 MED ORDER — DEXAMETHASONE SODIUM PHOSPHATE 10 MG/ML IJ SOLN
INTRAMUSCULAR | Status: DC | PRN
  Administered 2022-01-26: 10 mg via INTRAVENOUS

## 2022-01-26 MED ORDER — METHOCARBAMOL 1000 MG/10ML IJ SOLN: 500.0000 mg | Freq: Four times a day (QID) | INTRAVENOUS | Status: DC | PRN

## 2022-01-26 MED ORDER — ACETAMINOPHEN 650 MG RE SUPP: 650.0000 mg | RECTAL | Status: DC | PRN

## 2022-01-26 MED ORDER — OXYCODONE HCL 5 MG PO TABS
ORAL_TABLET | ORAL | Status: AC
  Filled 2022-01-26: qty 2

## 2022-01-26 MED ORDER — BUPIVACAINE LIPOSOME 1.3 % IJ SUSP
INTRAMUSCULAR | Status: AC
  Filled 2022-01-26: qty 20

## 2022-01-26 MED ORDER — SENNA 8.6 MG PO TABS
ORAL_TABLET | ORAL | Status: AC
  Filled 2022-01-26: qty 1

## 2022-01-26 MED ORDER — CHLORHEXIDINE GLUCONATE 0.12 % MT SOLN
OROMUCOSAL | Status: AC
  Administered 2022-01-26: 15 mL via OROMUCOSAL
  Filled 2022-01-26: qty 15

## 2022-01-26 MED ORDER — DEXAMETHASONE SODIUM PHOSPHATE 10 MG/ML IJ SOLN
INTRAMUSCULAR | Status: AC
  Filled 2022-01-26: qty 1

## 2022-01-26 MED ORDER — SENNA 8.6 MG PO TABS
1.0000 | ORAL_TABLET | Freq: Two times a day (BID) | ORAL | Status: DC
  Administered 2022-01-26 – 2022-01-29 (×5): 8.6 mg via ORAL
  Filled 2022-01-26 (×4): qty 1

## 2022-01-26 MED ORDER — KETOROLAC TROMETHAMINE 30 MG/ML IJ SOLN
INTRAMUSCULAR | Status: DC | PRN
  Administered 2022-01-26: 30 mg via INTRAVENOUS

## 2022-01-26 MED ORDER — BUPIVACAINE HCL (PF) 0.5 % IJ SOLN
INTRAMUSCULAR | Status: AC
  Filled 2022-01-26: qty 30

## 2022-01-26 MED ORDER — GABAPENTIN 600 MG PO TABS
600.0000 mg | ORAL_TABLET | Freq: Every day | ORAL | Status: DC
  Administered 2022-01-26 – 2022-01-28 (×3): 600 mg via ORAL
  Filled 2022-01-26 (×3): qty 1

## 2022-01-26 MED ORDER — BISACODYL 10 MG RE SUPP: 10.0000 mg | Freq: Every day | RECTAL | Status: DC | PRN

## 2022-01-26 MED ORDER — FLEET ENEMA 7-19 GM/118ML RE ENEM: 1.0000 | ENEMA | Freq: Once | RECTAL | Status: DC | PRN

## 2022-01-26 MED ORDER — HYDROMORPHONE HCL 1 MG/ML IJ SOLN
0.5000 mg | INTRAMUSCULAR | Status: DC | PRN
  Administered 2022-01-26 – 2022-01-27 (×2): 0.5 mg via INTRAVENOUS

## 2022-01-26 MED ORDER — HYDROMORPHONE HCL 1 MG/ML IJ SOLN
INTRAMUSCULAR | Status: AC
  Filled 2022-01-26: qty 0.5

## 2022-01-26 MED ORDER — SUCCINYLCHOLINE CHLORIDE 200 MG/10ML IV SOSY
PREFILLED_SYRINGE | INTRAVENOUS | Status: AC
  Filled 2022-01-26: qty 10

## 2022-01-26 MED ORDER — OXYCODONE HCL 5 MG PO TABS: 5.0000 mg | ORAL_TABLET | ORAL | Status: DC | PRN

## 2022-01-26 MED ORDER — IRRISEPT - 450ML BOTTLE WITH 0.05% CHG IN STERILE WATER, USP 99.95% OPTIME
TOPICAL | Status: DC | PRN
  Administered 2022-01-26: 450 mL

## 2022-01-26 MED ORDER — LIDOCAINE HCL (CARDIAC) PF 100 MG/5ML IV SOSY
PREFILLED_SYRINGE | INTRAVENOUS | Status: DC | PRN
  Administered 2022-01-26: 50 mg via INTRAVENOUS

## 2022-01-26 MED ORDER — MIDAZOLAM HCL 2 MG/2ML IJ SOLN
INTRAMUSCULAR | Status: AC
  Filled 2022-01-26: qty 2

## 2022-01-26 MED ORDER — KETOROLAC TROMETHAMINE 15 MG/ML IJ SOLN
15.0000 mg | Freq: Four times a day (QID) | INTRAMUSCULAR | Status: AC
  Administered 2022-01-26 – 2022-01-27 (×2): 15 mg via INTRAVENOUS

## 2022-01-26 MED ORDER — REMIFENTANIL HCL 1 MG IV SOLR
INTRAVENOUS | Status: DC | PRN
  Administered 2022-01-26: .3 ug/kg/min via INTRAVENOUS

## 2022-01-26 MED ORDER — ATORVASTATIN CALCIUM 20 MG PO TABS
40.0000 mg | ORAL_TABLET | Freq: Every day | ORAL | Status: DC
  Administered 2022-01-26 – 2022-01-28 (×3): 40 mg via ORAL
  Filled 2022-01-26 (×2): qty 2

## 2022-01-26 MED ORDER — HYDROCHLOROTHIAZIDE 25 MG PO TABS
ORAL_TABLET | ORAL | Status: AC
  Filled 2022-01-26: qty 1

## 2022-01-26 MED ORDER — HYDROMORPHONE HCL 1 MG/ML IJ SOLN
0.5000 mg | INTRAMUSCULAR | Status: DC | PRN
  Administered 2022-01-26 (×2): 0.5 mg via INTRAVENOUS

## 2022-01-26 MED ORDER — FENTANYL CITRATE (PF) 100 MCG/2ML IJ SOLN
25.0000 ug | INTRAMUSCULAR | Status: DC | PRN
  Administered 2022-01-26 (×2): 50 ug via INTRAVENOUS

## 2022-01-26 MED ORDER — SODIUM CHLORIDE 0.9 % IV SOLN: INTRAVENOUS | Status: DC

## 2022-01-26 MED ORDER — GABAPENTIN 600 MG PO TABS
300.0000 mg | ORAL_TABLET | Freq: Every morning | ORAL | Status: DC
  Administered 2022-01-27 – 2022-01-29 (×3): 300 mg via ORAL
  Filled 2022-01-26 (×3): qty 0.5

## 2022-01-26 MED ORDER — ONDANSETRON HCL 4 MG/2ML IJ SOLN: 4.0000 mg | Freq: Four times a day (QID) | INTRAMUSCULAR | Status: DC | PRN

## 2022-01-26 MED ORDER — ACETAMINOPHEN 325 MG PO TABS
650.0000 mg | ORAL_TABLET | ORAL | Status: DC | PRN
  Administered 2022-01-29: 650 mg via ORAL
  Filled 2022-01-26: qty 2

## 2022-01-26 MED ORDER — DULOXETINE HCL 30 MG PO CPEP
30.0000 mg | ORAL_CAPSULE | Freq: Every day | ORAL | Status: DC
  Administered 2022-01-27 – 2022-01-29 (×3): 30 mg via ORAL
  Filled 2022-01-26 (×4): qty 1

## 2022-01-26 MED ORDER — ACETAMINOPHEN 10 MG/ML IV SOLN
INTRAVENOUS | Status: DC | PRN
  Administered 2022-01-26: 1000 mg via INTRAVENOUS

## 2022-01-26 MED ORDER — FENTANYL CITRATE (PF) 100 MCG/2ML IJ SOLN
INTRAMUSCULAR | Status: AC
  Filled 2022-01-26: qty 2

## 2022-01-26 MED ORDER — SODIUM CHLORIDE (PF) 0.9 % IJ SOLN
INTRAMUSCULAR | Status: DC | PRN
  Administered 2022-01-26: 60 mL via INTRAMUSCULAR

## 2022-01-26 MED ORDER — PROPOFOL 1000 MG/100ML IV EMUL
INTRAVENOUS | Status: AC
  Filled 2022-01-26: qty 100

## 2022-01-26 MED ORDER — ONDANSETRON HCL 4 MG/2ML IJ SOLN
INTRAMUSCULAR | Status: AC
  Filled 2022-01-26: qty 2

## 2022-01-26 MED ORDER — METHOCARBAMOL 500 MG PO TABS
ORAL_TABLET | ORAL | Status: AC
  Filled 2022-01-26: qty 1

## 2022-01-26 MED ORDER — PHENOL 1.4 % MT LIQD: 1.0000 | OROMUCOSAL | Status: DC | PRN

## 2022-01-26 MED ORDER — VANCOMYCIN HCL IN DEXTROSE 1-5 GM/200ML-% IV SOLN
INTRAVENOUS | Status: AC
  Administered 2022-01-26: 1000 mg via INTRAVENOUS
  Filled 2022-01-26: qty 200

## 2022-01-26 MED ORDER — CEFAZOLIN SODIUM-DEXTROSE 2-4 GM/100ML-% IV SOLN
INTRAVENOUS | Status: AC
  Filled 2022-01-26: qty 100

## 2022-01-26 MED ORDER — SODIUM CHLORIDE FLUSH 0.9 % IV SOLN
INTRAVENOUS | Status: AC
  Filled 2022-01-26: qty 20

## 2022-01-26 MED ORDER — BUPIVACAINE-EPINEPHRINE 0.5% -1:200000 IJ SOLN
INTRAMUSCULAR | Status: DC | PRN
  Administered 2022-01-26: 4 mL
  Administered 2022-01-26: 10 mL

## 2022-01-26 MED ORDER — SURGIFLO WITH THROMBIN (HEMOSTATIC MATRIX KIT) OPTIME
TOPICAL | Status: DC | PRN
  Administered 2022-01-26: 1 via TOPICAL

## 2022-01-26 MED ORDER — ACETAMINOPHEN 500 MG PO TABS
1000.0000 mg | ORAL_TABLET | Freq: Four times a day (QID) | ORAL | Status: AC
  Administered 2022-01-26 – 2022-01-27 (×3): 1000 mg via ORAL

## 2022-01-26 MED ORDER — ASPIRIN 81 MG PO TBEC
81.0000 mg | DELAYED_RELEASE_TABLET | Freq: Every day | ORAL | Status: DC
  Administered 2022-01-28 – 2022-01-29 (×2): 81 mg via ORAL
  Filled 2022-01-26 (×4): qty 1

## 2022-01-26 MED ORDER — BUPIVACAINE-EPINEPHRINE (PF) 0.5% -1:200000 IJ SOLN
INTRAMUSCULAR | Status: AC
  Filled 2022-01-26: qty 30

## 2022-01-26 MED ORDER — REMIFENTANIL HCL 1 MG IV SOLR
INTRAVENOUS | Status: AC
  Filled 2022-01-26: qty 1000

## 2022-01-26 MED ORDER — EPHEDRINE SULFATE (PRESSORS) 50 MG/ML IJ SOLN
INTRAMUSCULAR | Status: DC | PRN
  Administered 2022-01-26 (×2): 5 mg via INTRAVENOUS
  Administered 2022-01-26: 10 mg via INTRAVENOUS

## 2022-01-26 MED ORDER — ACETAMINOPHEN 500 MG PO TABS
ORAL_TABLET | ORAL | Status: AC
  Filled 2022-01-26: qty 2

## 2022-01-26 MED ORDER — LABETALOL HCL 5 MG/ML IV SOLN
INTRAVENOUS | Status: AC
  Filled 2022-01-26: qty 4

## 2022-01-26 MED ORDER — LOSARTAN POTASSIUM-HCTZ 100-25 MG PO TABS: 1.0000 | ORAL_TABLET | Freq: Every day | ORAL | Status: DC

## 2022-01-26 MED ORDER — NORTRIPTYLINE HCL 10 MG PO CAPS
20.0000 mg | ORAL_CAPSULE | Freq: Two times a day (BID) | ORAL | Status: DC
  Administered 2022-01-26 – 2022-01-29 (×6): 20 mg via ORAL
  Filled 2022-01-26 (×7): qty 2

## 2022-01-26 MED ORDER — PRAMIPEXOLE DIHYDROCHLORIDE 1 MG PO TABS
1.0000 mg | ORAL_TABLET | Freq: Two times a day (BID) | ORAL | Status: DC
  Administered 2022-01-26 – 2022-01-29 (×5): 1 mg via ORAL
  Filled 2022-01-26 (×6): qty 1

## 2022-01-26 MED ORDER — SODIUM CHLORIDE 0.9% FLUSH
3.0000 mL | Freq: Two times a day (BID) | INTRAVENOUS | Status: DC
  Administered 2022-01-26 – 2022-01-29 (×6): 3 mL via INTRAVENOUS

## 2022-01-26 MED ORDER — LIDOCAINE HCL (PF) 2 % IJ SOLN
INTRAMUSCULAR | Status: AC
  Filled 2022-01-26: qty 5

## 2022-01-26 MED ORDER — OXYCODONE HCL 5 MG/5ML PO SOLN: 5.0000 mg | Freq: Once | ORAL | Status: DC | PRN

## 2022-01-26 MED ORDER — PROPOFOL 10 MG/ML IV BOLUS
INTRAVENOUS | Status: DC | PRN
  Administered 2022-01-26: 120 mg via INTRAVENOUS

## 2022-01-26 MED ORDER — PHENYLEPHRINE 80 MCG/ML (10ML) SYRINGE FOR IV PUSH (FOR BLOOD PRESSURE SUPPORT)
PREFILLED_SYRINGE | INTRAVENOUS | Status: AC
  Filled 2022-01-26: qty 10

## 2022-01-26 MED ORDER — 0.9 % SODIUM CHLORIDE (POUR BTL) OPTIME
TOPICAL | Status: DC | PRN
  Administered 2022-01-26: 500 mL

## 2022-01-26 MED ORDER — PHENYLEPHRINE HCL (PRESSORS) 10 MG/ML IV SOLN
INTRAVENOUS | Status: DC | PRN
  Administered 2022-01-26: 80 ug via INTRAVENOUS
  Administered 2022-01-26 (×3): 160 ug via INTRAVENOUS

## 2022-01-26 MED ORDER — VANCOMYCIN HCL IN DEXTROSE 1-5 GM/200ML-% IV SOLN: 1000.0000 mg | Freq: Once | INTRAVENOUS | Status: AC

## 2022-01-26 MED ORDER — LOSARTAN POTASSIUM 50 MG PO TABS
100.0000 mg | ORAL_TABLET | Freq: Every day | ORAL | Status: DC
  Administered 2022-01-26 – 2022-01-29 (×3): 100 mg via ORAL
  Filled 2022-01-26 (×2): qty 2

## 2022-01-26 MED ORDER — CEFAZOLIN IN SODIUM CHLORIDE 2-0.9 GM/100ML-% IV SOLN
2.0000 g | Freq: Once | INTRAVENOUS | Status: AC
  Administered 2022-01-26: 2 g via INTRAVENOUS
  Filled 2022-01-26: qty 100

## 2022-01-26 MED ORDER — LABETALOL HCL 5 MG/ML IV SOLN
10.0000 mg | INTRAVENOUS | Status: DC | PRN
  Administered 2022-01-26: 10 mg via INTRAVENOUS

## 2022-01-26 MED ORDER — ATORVASTATIN CALCIUM 20 MG PO TABS
ORAL_TABLET | ORAL | Status: AC
  Filled 2022-01-26: qty 2

## 2022-01-26 MED ORDER — OXYCODONE HCL 5 MG PO TABS
10.0000 mg | ORAL_TABLET | ORAL | Status: DC | PRN
  Administered 2022-01-26 – 2022-01-28 (×7): 10 mg via ORAL
  Filled 2022-01-26 (×2): qty 2

## 2022-01-26 MED ORDER — FENTANYL CITRATE (PF) 100 MCG/2ML IJ SOLN
INTRAMUSCULAR | Status: DC | PRN
  Administered 2022-01-26: 50 ug via INTRAVENOUS

## 2022-01-26 MED ORDER — ORAL CARE MOUTH RINSE: 15.0000 mL | Freq: Once | OROMUCOSAL | Status: AC

## 2022-01-26 MED ORDER — SODIUM CHLORIDE 0.9 % IV SOLN
250.0000 mL | INTRAVENOUS | Status: DC
  Administered 2022-01-27: 250 mL via INTRAVENOUS

## 2022-01-26 MED ORDER — KETOROLAC TROMETHAMINE 15 MG/ML IJ SOLN
INTRAMUSCULAR | Status: AC
  Filled 2022-01-26: qty 1

## 2022-01-26 MED ORDER — METOPROLOL TARTRATE 25 MG PO TABS
25.0000 mg | ORAL_TABLET | Freq: Every day | ORAL | Status: DC
  Administered 2022-01-28 – 2022-01-29 (×2): 25 mg via ORAL
  Filled 2022-01-26 (×2): qty 1

## 2022-01-26 SURGICAL SUPPLY — 90 items
ALLOGRAFT BONESTRP KORE 2.5X10 (Bone Implant) IMPLANT
BASIN KIT SINGLE STR (MISCELLANEOUS) ×1 IMPLANT
BUR NEURO DRILL SOFT 3.0X3.8M (BURR) IMPLANT
CAP LOCKING SPINE (Cap) IMPLANT
CHLORAPREP W/TINT 26 (MISCELLANEOUS) ×4 IMPLANT
CORD BIP STRL DISP 12FT (MISCELLANEOUS) ×1 IMPLANT
CORD LIGHT LATERIAL X LIFT (MISCELLANEOUS) IMPLANT
COUNTER NEEDLE 20/40 LG (NEEDLE) ×2 IMPLANT
COVER BACK TABLE REUSABLE LG (DRAPES) ×1 IMPLANT
CUP MEDICINE 2OZ PLAST GRAD ST (MISCELLANEOUS) ×1 IMPLANT
DERMABOND ADVANCED .7 DNX12 (GAUZE/BANDAGES/DRESSINGS) ×3 IMPLANT
DRAPE 3D C-ARM OEC (DRAPES) IMPLANT
DRAPE C ARM PK CFD 31 SPINE (DRAPES) ×1 IMPLANT
DRAPE C-ARMOR (DRAPES) IMPLANT
DRAPE INCISE IOBAN 66X45 STRL (DRAPES) ×1 IMPLANT
DRAPE LAPAROTOMY 100X77 ABD (DRAPES) ×2 IMPLANT
DRAPE MICROSCOPE SPINE 48X150 (DRAPES) IMPLANT
DRAPE SCAN PATIENT (DRAPES) ×1 IMPLANT
DRAPE SURG 17X11 SM STRL (DRAPES) ×2 IMPLANT
DRSG OPSITE POSTOP 3X4 (GAUZE/BANDAGES/DRESSINGS) IMPLANT
DRSG OPSITE POSTOP 4X6 (GAUZE/BANDAGES/DRESSINGS) IMPLANT
DRSG TEGADERM 2-3/8X2-3/4 SM (GAUZE/BANDAGES/DRESSINGS) IMPLANT
DRSG TEGADERM 4X4.75 (GAUZE/BANDAGES/DRESSINGS) IMPLANT
DRSG TEGADERM 6X8 (GAUZE/BANDAGES/DRESSINGS) IMPLANT
ELECT CAUTERY BLADE TIP 2.5 (TIP) ×1
ELECT EZSTD 165MM 6.5IN (MISCELLANEOUS) ×1
ELECT REM PT RETURN 9FT ADLT (ELECTROSURGICAL) ×2
ELECTRODE CAUTERY BLDE TIP 2.5 (TIP) ×1 IMPLANT
ELECTRODE EZSTD 165MM 6.5IN (MISCELLANEOUS) ×1 IMPLANT
ELECTRODE REM PT RTRN 9FT ADLT (ELECTROSURGICAL) ×2 IMPLANT
EX-PIN ORTHOLOCK NAV 4X150 (PIN) IMPLANT
FEE INTRAOP CADWELL SUPPLY NCS (MISCELLANEOUS) ×1 IMPLANT
FEE INTRAOP MONITOR IMPULS NCS (MISCELLANEOUS) ×1 IMPLANT
FORCEPS BPLR BAYO 10IN 1.0TIP (ORTHOPEDIC DISPOSABLE SUPPLIES) IMPLANT
GAUZE 4X4 16PLY ~~LOC~~+RFID DBL (SPONGE) ×1 IMPLANT
GLOVE BIOGEL PI IND STRL 6.5 (GLOVE) ×3 IMPLANT
GLOVE SURG SYN 6.5 ES PF (GLOVE) ×5 IMPLANT
GLOVE SURG SYN 6.5 PF PI (GLOVE) ×5 IMPLANT
GLOVE SURG SYN 8.5  E (GLOVE) ×6
GLOVE SURG SYN 8.5 E (GLOVE) ×6 IMPLANT
GLOVE SURG SYN 8.5 PF PI (GLOVE) ×6 IMPLANT
GOWN SRG LRG LVL 4 IMPRV REINF (GOWNS) ×2 IMPLANT
GOWN SRG XL LVL 3 NONREINFORCE (GOWNS) ×2 IMPLANT
GOWN STRL NON-REIN TWL XL LVL3 (GOWNS) ×2
GOWN STRL REIN LRG LVL4 (GOWNS) ×2
GRADUATE 1200CC STRL 31836 (MISCELLANEOUS) ×1 IMPLANT
HOLDER FOLEY CATH W/STRAP (MISCELLANEOUS) ×1 IMPLANT
INTRAOP CADWELL SUPPLY FEE NCS (MISCELLANEOUS) ×1
INTRAOP DISP SUPPLY FEE NCS (MISCELLANEOUS) ×1
INTRAOP MONITOR FEE IMPULS NCS (MISCELLANEOUS) ×1
INTRAOP MONITOR FEE IMPULSE (MISCELLANEOUS) ×1
JET LAVAGE IRRISEPT WOUND (IRRIGATION / IRRIGATOR) ×1
K-WIRE 1.6 NITINOL SHARP TIP (WIRE) ×6
KIT DISP MARS 3V (KITS) IMPLANT
KIT PEDICLE ACCESS (KITS) IMPLANT
KIT SPINAL PRONEVIEW (KITS) ×1 IMPLANT
KIT TURNOVER KIT A (KITS) ×1 IMPLANT
KNIFE BAYONET SHORT DISCETOMY (MISCELLANEOUS) IMPLANT
KWIRE 1.6 NITINOL SHARP TIP (WIRE) IMPLANT
LAVAGE JET IRRISEPT WOUND (IRRIGATION / IRRIGATOR) IMPLANT
MANIFOLD NEPTUNE II (INSTRUMENTS) ×2 IMPLANT
MARKER SKIN DUAL TIP RULER LAB (MISCELLANEOUS) ×3 IMPLANT
MARKER SPHERE PSV REFLC 13MM (MARKER) ×7 IMPLANT
NDL SAFETY ECLIP 18X1.5 (MISCELLANEOUS) ×1 IMPLANT
NS IRRIG 500ML POUR BTL (IV SOLUTION) IMPLANT
PACK LAMINECTOMY NEURO (CUSTOM PROCEDURE TRAY) ×1 IMPLANT
PAD ARMBOARD 7.5X6 YLW CONV (MISCELLANEOUS) ×1 IMPLANT
ROD SPINAL 5.5X75 (Rod) IMPLANT
SCREW CREO 6.5X45 FEN (Screw) IMPLANT
SCREW CREO MIS 30 TULIP (Screw) IMPLANT
SOLUTION IRRIG SURGIPHOR (IV SOLUTION) ×1 IMPLANT
SPACER HEDRON 10D 18X45X11 (Spacer) IMPLANT
SPONGE GAUZE 2X2 8PLY STRL LF (GAUZE/BANDAGES/DRESSINGS) IMPLANT
STAPLER SKIN PROX 35W (STAPLE) IMPLANT
SURGIFLO W/THROMBIN 8M KIT (HEMOSTASIS) ×1 IMPLANT
SUT DVC VLOC 3-0 CL 6 P-12 (SUTURE) ×1 IMPLANT
SUT ETHILON 3-0 FS-10 30 BLK (SUTURE)
SUT VIC AB 0 CT1 27 (SUTURE) ×2
SUT VIC AB 0 CT1 27XCR 8 STRN (SUTURE) ×1 IMPLANT
SUT VIC AB 2-0 CT1 18 (SUTURE) ×1 IMPLANT
SUTURE EHLN 3-0 FS-10 30 BLK (SUTURE) IMPLANT
SYR 30ML LL (SYRINGE) ×2 IMPLANT
TOWEL OR 17X26 4PK STRL BLUE (TOWEL DISPOSABLE) ×5 IMPLANT
TRAP FLUID SMOKE EVACUATOR (MISCELLANEOUS) ×1 IMPLANT
TRAY FOLEY MTR SLVR 16FR STAT (SET/KITS/TRAYS/PACK) IMPLANT
TROCAR INSERT W/PEDICLE NDL (TROCAR) IMPLANT
TROCAR INSERT W/PEDICLE NDLE (TROCAR)
TUBING CONNECTING 10 (TUBING) ×2 IMPLANT
WATER STERILE IRR 1000ML POUR (IV SOLUTION) ×2 IMPLANT
WATER STERILE IRR 500ML POUR (IV SOLUTION) IMPLANT

## 2022-01-26 NOTE — Anesthesia Preprocedure Evaluation (Signed)
Anesthesia Evaluation  Patient identified by MRN, date of birth, ID band Patient awake    Reviewed: Allergy & Precautions, NPO status , Patient's Chart, lab work & pertinent test results  History of Anesthesia Complications (+) PONV and history of anesthetic complications  Airway Mallampati: III  TM Distance: >3 FB Neck ROM: full    Dental  (+) Dental Advidsory Given, Poor Dentition, Partial Upper   Pulmonary neg shortness of breath, sleep apnea , neg COPD, former smoker   Pulmonary exam normal        Cardiovascular hypertension, (-) angina (-) Past MI and (-) CABG Normal cardiovascular exam+ pacemaker      Neuro/Psych  PSYCHIATRIC DISORDERS      negative neurological ROS     GI/Hepatic Neg liver ROS,GERD  ,,  Endo/Other  negative endocrine ROSdiabetes    Renal/GU      Musculoskeletal   Abdominal   Peds  Hematology negative hematology ROS (+)   Anesthesia Other Findings Past Medical History: No date: Anxiety     Comment:  a.) on BZO (clonazepam) PRN No date: Cervical spinal stenosis No date: Depression No date: Diastolic dysfunction     Comment:  a.)  TTE 05/2021: EF 90%, mild LVH, trivial MR/TR/PR,               G1DD No date: Esophageal reflux No date: Esophageal stricture     Comment:  a.) s/p multiple (2012, 2015, 2017, 2020) dilitation               procedures No date: GERD (gastroesophageal reflux disease) No date: Heart murmur No date: Hyperlipidemia No date: Hypertension No date: Insomnia No date: Osteoarthritis No date: Osteoporosis No date: Other secondary kyphosis, cervical region No date: PAD (peripheral artery disease) (HCC) No date: PONV (postoperative nausea and vomiting) 05/2015: Presence of permanent cardiac pacemaker     Comment:  a.) St. Jude device No date: RLS (restless legs syndrome)     Comment:  a.) takes pramipexole No date: Sleep apnea No date: SOB (shortness of breath) No  date: T2DM (type 2 diabetes mellitus) (HCC) No date: Urinary incontinence No date: Venous insufficiency of both lower extremities  Past Surgical History: 11/03/2021: ANTERIOR CERVICAL DECOMP/DISCECTOMY FUSION; N/A     Comment:  Procedure: C4-7 ANTERIOR CERVICAL DISCECTOMY AND FUSION               (GLOBUS FORGE);  Surgeon: [name], MD;                Location: [facility] ORS;  Service: Neurosurgery;  Laterality:               N/A; 2014: APPENDECTOMY 1998: CARPAL TUNNEL RELEASE; Left 2012: ESOPHAGEAL DILATION     Comment:  2015, 2017, 2020 2019: GASTRIC BYPASS 2019: HERNIA REPAIR 02/05/2019: left knee manipulation; Left 05/2015: PACEMAKER IMPLANT     Comment:  Procedure: PACEMAKER INSERTION; Location: [place],               [place]; Surgeon: Maryjean Ka, MD No date: POLYPECTOMY 2014: SHOULDER SURGERY; Left 2017: SHOULDER SURGERY; Right 2018: TOTAL KNEE ARTHROPLASTY; Right 10/18/2018: TOTAL KNEE ARTHROPLASTY; Left 2012: vein removal; Bilateral  BMI    Body Mass Index: 39.56 kg/m      Reproductive/Obstetrics negative OB ROS                             Anesthesia Physical Anesthesia Plan  ASA: 3  Anesthesia Plan:  General ETT   Post-op Pain Management:    Induction: Intravenous  PONV Risk Score and Plan: 4 or greater and TIVA, Propofol infusion, Ondansetron and Dexamethasone  Airway Management Planned: Oral ETT  Additional Equipment:   Intra-op Plan:   Post-operative Plan: Extubation in OR  Informed Consent: I have reviewed the patients History and Physical, chart, labs and discussed the procedure including the risks, benefits and alternatives for the proposed anesthesia with the patient or authorized representative who has indicated his/her understanding and acceptance.     Dental Advisory Given  Plan Discussed with: Anesthesiologist, CRNA and Surgeon  Anesthesia Plan Comments: (Patient consented for risks of anesthesia including but not  limited to:  - adverse reactions to medications - damage to eyes, teeth, lips or other oral mucosa - nerve damage due to positioning  - sore throat or hoarseness - Damage to heart, brain, nerves, lungs, other parts of body or loss of life  Patient voiced understanding.)        Anesthesia Quick Evaluation

## 2022-01-26 NOTE — Discharge Summary (Incomplete Revision)
Discharge Summary  Patient ID: Janet [name] MRN: [id number] DOB/AGE: 03-12-1950 71 y.o.  Admit date: 01/26/2022 Discharge date: 01/29/2022  Admission Diagnoses: M43.16 Spondylolisthesis of lumbar region, M54.42, M54.41, G89.29 Chronic bilateral low back pain with bilateral sciatica    Discharge Diagnoses:  Principal Problem:   Spondylolisthesis of lumbar region Active Problems:   Chronic bilateral low back pain with bilateral sciatica   Discharged Condition: good  Hospital Course:  Janet [name] is a 71 y.o s/p L3-5 XLIF and PSF for spondylolisthesis and radiculopathy. Her intraoperative course was uncomplicated and she was admitted for pain control and therapy evaluation. On POD1 she experienced some increased pain in her back and concern for a superficial hematoma. These issues resolved during his hospital stay and her pain was well controlled on oral medications. She was seen by therapy and deemed appropriate for discharge home with home health. She was discharged home on POD2 prescriptions for Oxycodone, Robaxin, and Senna.   Consults: None  Significant Diagnostic Studies: none  Treatments: surgery: as above. Please see separately dictated operative report for further details   Discharge Exam: Blood pressure 105/65, pulse 90, temperature 98.1 F (36.7 C), temperature source Oral, resp. rate 16, height 5\' 2"  (1.575 m), weight 98.1 kg, SpO2 97 %. CNII-XII grossly intact 5/5 throughout BLE  Incisions with blood tinged dressings  Disposition: Discharge disposition: 06-Home-Health Care Svc       Discharge Instructions     Diet - low sodium heart healthy   Complete by: As directed    Increase activity slowly   Complete by: As directed    Remove dressing in 24 hours   Complete by: As directed       Allergies as of 01/28/2022   No Known Allergies   Med Rec must be completed prior to using this SMARTLINK***         Signed: Lorelie Biermann 01/28/2022, 2:25  PM

## 2022-01-26 NOTE — Transfer of Care (Signed)
Immediate Anesthesia Transfer of Care Note  Patient: Janet [name]  Procedure(s) Performed: L3-5 LATERAL LUMBAR INTERBODY FUSION WITH POSTERIOR SPINAL FUSION (Spine Lumbar) APPLICATION OF INTRAOPERATIVE CT SCAN (Spine Lumbar)  Patient Location: PACU  Anesthesia Type:General  Level of Consciousness: awake, drowsy, and patient cooperative  Airway & Oxygen Therapy: Patient Spontanous Breathing  Post-op Assessment: Report given to RN and Post -op Vital signs reviewed and stable  Post vital signs: Reviewed and stable  Last Vitals:  Vitals Value Taken Time  BP 168/97 01/26/22 1430  Temp    Pulse 89 01/26/22 1434  Resp 16 01/26/22 1434  SpO2 100 % 01/26/22 1434  Vitals shown include unvalidated device data.  Last Pain:  Vitals:   01/26/22 0806  TempSrc: Oral  PainSc: 4          Complications: No notable events documented.

## 2022-01-26 NOTE — H&P (Signed)
Referring Physician:  No referring provider defined for this encounter.  Primary Physician:  Smitty Cords, DO  History of Present Illness: 01/26/2022 [name] is here today with a chief complaint of back and leg pain as noted below.  12/14/2021 [name] is doing very well from her neck surgery.  She is very happy with her improvements.   She is still having substantial difficulty with her back and legs.   10/21/2021 [name] is here today with a chief complaint of back and leg pain.  It has worsened over time.  Activity and working on her feet bother her.  She can walk 5 minutes before she has to stop due to leg weakness and discomfort in her back and legs.    She is also having pain down her arms.  She gets numbness and tingling in her hands.  She has had issues with her balance, and has had some falls.  She is walking very slowly to try to keep from falling.     06/11/2020 [name] is here today with a chief complaint of low back pain and left lateral leg pain and numbness. She reports bilateral leg weakness for the past 2 years.  She has had back trouble for several years. She previously underwent a course of conservative management several years ago, and it helped her significantly. She now has worsening symptoms and has significant trouble with pain down her left leg when she walks or stands. She has to walk very slowly, and can only walk approximately 15 minutes. Her pain gets worse as the day goes on and she ultimately feels that she is bent forward.  Bowel/Bladder Dysfunction: none  Conservative measures:  Physical therapy: has not participated Multimodal medical therapy including regular antiinflammatories: baclofen, gabapentin,  Injections: has received epidural steroid injections about 3 years ago in [place], [place]  Past Surgery: none  Janet [name] has no symptoms of cervical myelopathy.  The symptoms are causing a significant impact  on the patient's life.   Review of Systems:  A 10 point review of systems is negative, except for the pertinent positives and negatives detailed in the HPI.  Past Medical History: Past Medical History:  Diagnosis Date   Anxiety    a.) on BZO (clonazepam) PRN   Cervical spinal stenosis    Depression    Diastolic dysfunction    a.)  TTE 05/2021: EF 90%, mild LVH, trivial MR/TR/PR, G1DD   Esophageal reflux    Esophageal stricture    a.) s/p multiple (2012, 2015, 2017, 2020) dilitation procedures   GERD (gastroesophageal reflux disease)    Heart murmur    Hyperlipidemia    Hypertension    Insomnia    Osteoarthritis    Osteoporosis    Other secondary kyphosis, cervical region    PAD (peripheral artery disease) (HCC)    PONV (postoperative nausea and vomiting)    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 Jude device   RLS (restless legs syndrome)    a.) takes pramipexole   Sleep apnea    SOB (shortness of breath)    T2DM (type 2 diabetes mellitus) (HCC)    Urinary incontinence    Venous insufficiency of both lower extremities     Past Surgical History: Past Surgical History:  Procedure Laterality Date   ANTERIOR CERVICAL DECOMP/DISCECTOMY FUSION N/A 11/03/2021   Procedure: C4-7 ANTERIOR CERVICAL DISCECTOMY AND FUSION (GLOBUS FORGE);  Surgeon: [name], MD;  Location: [facility] ORS;  Service: Neurosurgery;  Laterality: N/A;   APPENDECTOMY  2014   CARPAL TUNNEL RELEASE Left 1998   ESOPHAGEAL DILATION  2012   2015, 2017, 2020   GASTRIC BYPASS  2019   HERNIA REPAIR  2019   left knee manipulation Left 02/05/2019   PACEMAKER IMPLANT  05/2015   Procedure: PACEMAKER INSERTION; Location: [place]; Surgeon: Elvia Collum, MD   POLYPECTOMY     SHOULDER SURGERY Left 2014   SHOULDER SURGERY Right 2017   TOTAL KNEE ARTHROPLASTY Right 2018   TOTAL KNEE ARTHROPLASTY Left 10/18/2018   vein removal Bilateral 2012    Allergies: Allergies as of 12/15/2021   (No  Known Allergies)    Medications: Current Meds  Medication Sig   aspirin 81 MG EC tablet Take 81 mg by mouth daily.   atorvastatin (LIPITOR) 40 MG tablet Take 1 tablet (40 mg total) by mouth at bedtime.   DULoxetine (CYMBALTA) 30 MG capsule TAKE 1 CAPSULE(30 MG) BY MOUTH DAILY   gabapentin (NEURONTIN) 300 MG capsule Take 300 mg by mouth 2 (two) times daily.   losartan-hydrochlorothiazide (HYZAAR) 100-25 MG tablet TAKE 1 TABLET BY MOUTH DAILY   metoprolol tartrate (LOPRESSOR) 25 MG tablet Take 25 mg by mouth daily.   nortriptyline (PAMELOR) 10 MG capsule Take 2 mg by mouth 2 (two) times daily.   pramipexole (MIRAPEX) 1 MG tablet Take 1 tablet (1 mg total) by mouth 2 (two) times daily.    Social History: Social History   Tobacco Use   Smoking status: Former    Packs/day: 1.00    Years: 25.00    Total pack years: 25.00    Types: Cigarettes    Quit date: 2007    Years since quitting: 16.9   Smokeless tobacco: Former  Scientific laboratory technician Use: Never used  Substance Use Topics   Alcohol use: Yes    Comment: very rarely   Drug use: Never    Family Medical History: Family History  Problem Relation Age of Onset   Heart attack Mother 16   Heart disease Father    Thyroid disease Father    Skin cancer Father    Brain cancer Maternal Grandmother     Physical Examination: Vitals:   01/26/22 0806  BP: (!) 159/82  Pulse: 92  Resp: 14  Temp: 97.6 F (36.4 C)  SpO2: 100%   Heart sounds normal no MRG. Chest Clear to Auscultation Bilaterally.  General: Patient is well developed, well nourished, calm, collected, and in no apparent distress. Attention to examination is appropriate.  Neck:   Supple.  Full range of motion.  Respiratory: Patient is breathing without any difficulty.   NEUROLOGICAL:     Awake, alert, oriented to person, place, and time.  Speech is clear and fluent. Fund of knowledge is appropriate.   Cranial Nerves: Pupils equal round and reactive to light.   Facial tone is symmetric.  Facial sensation is symmetric. Shoulder shrug is symmetric. Tongue protrusion is midline.  There is no pronator drift.  ROM of spine: full.    Strength: Side Biceps Triceps Deltoid Interossei Grip Wrist Ext. Wrist Flex.  R 5 5 5 5 5 5 5   L 5 5 5 5 5 5 5    Side Iliopsoas Quads Hamstring PF DF EHL  R 5 5 5 5 5 5   L 5 5 5 5 5 5    Reflexes are 1+ and symmetric at the biceps, triceps, brachioradialis, patella and achilles.  Hoffman's is absent.   Bilateral upper and lower extremity sensation is intact to light touch.    No evidence of dysmetria noted.  Gait is untested.     Medical Decision Making  Imaging: MRI CL spine 09/30/21 L3-L4: Grade 1 anterolisthesis. Uncovering the disc and superimposed disc bulging. Ligamentum flavum thickening and facet arthropathy. Slight progression of severe canal stenosis. Mild bilateral foraminal stenosis.   L4-L5: Grade 1 anterolisthesis. Uncovering the disc with superimposed disc bulging. Ligamentum flavum thickening and facet arthropathy. Slight progression of severe canal stenosis. Mild bilateral foraminal stenosis.   L5-S1: Facet arthropathy without significant stenosis.   IMPRESSION: Slight progression of severe canal stenosis at L3-L4 and L4-L5.     Electronically Signed   By: Margaretha Sheffield M.D.   On: 09/30/2021 15:18   IMPRESSION: 1. At C4-C5, moderate to severe canal stenosis and severe bilateral foraminal stenosis. 2. At C5-C6, moderate canal stenosis and moderate bilateral foraminal stenosis. 3. At C6-C7, severe left and moderate right foraminal stenosis with mild canal stenosis.     Electronically Signed   By: Margaretha Sheffield M.D.   On: 09/30/2021 15:14   Lumbar spine flexion-extension films from December 14, 2021 show 2 mm and 6 mm anterolisthesis of L3 on L4 and L4 on L5, respectively, and the extension view that increases to 6 mm and 10 mm at the same levels on flexion.   I have  personally reviewed the images and agree with the above interpretation.  I have personally reviewed the images and agree with the above interpretation.  Assessment and Plan: [name] is a pleasant 71 y.o. female with symptoms of cervical stenosis with cervical myelopathy as well as lumbar stenosis and back pain due to anterolisthesis of L3-4 and L4-5.  She has substantial change in her anterolisthesis when she performs flexion extension.  She has had surgery for her neck and now presents to discuss potential options for her lower back.   She has tried physical therapy and injections.  She is having worsening function of her arms and legs.   At this point, I do not think that further conservative management is indicated.  I have recommended L3-5 lateral lumbar interbody fusion with posterior fixation and fusion.     Janet Thebeau K. Izora Ribas MD, [facility] Neurosurgery

## 2022-01-26 NOTE — Discharge Summary (Cosign Needed)
Discharge Summary  Patient ID: Janet [name] MRN: [id number] DOB/AGE: September 19, 1950 71 y.o.  Admit date: 01/26/2022 Discharge date: 01/29/2022  Admission Diagnoses: M43.16 Spondylolisthesis of lumbar region, M54.42, M54.41, G89.29 Chronic bilateral low back pain with bilateral sciatica    Discharge Diagnoses:  Principal Problem:   Spondylolisthesis of lumbar region Active Problems:   Chronic bilateral low back pain with bilateral sciatica   Discharged Condition: good  Hospital Course:  Janet [name] is a 71 y.o s/p L3-5 XLIF and PSF for spondylolisthesis and radiculopathy. Her intraoperative course was uncomplicated and she was admitted for pain control and therapy evaluation. On POD1 she experienced some increased pain in her back and concern for a superficial hematoma. These issues resolved during his hospital stay and her pain was well controlled on oral medications. She was seen by therapy and deemed appropriate for discharge home with home health. She was discharged home on POD2 prescriptions for Oxycodone, Robaxin, and Senna.   Consults: None  Significant Diagnostic Studies: none  Treatments: surgery: as above. Please see separately dictated operative report for further details   Discharge Exam: Blood pressure (!) 145/80, pulse 95, temperature 100.3 F (37.9 C), resp. rate 18, height 5\' 2"  (1.575 m), weight 98.1 kg, SpO2 98 %. CNII-XII grossly intact 5/5 throughout BLE  Incisions with blood tinged dressings  Disposition:    Discharge Instructions     Diet - low sodium heart healthy   Complete by: As directed    Increase activity slowly   Complete by: As directed    Remove dressing in 24 hours   Complete by: As directed       Allergies as of 01/29/2022   No Known Allergies      Medication List     STOP taking these medications    clonazePAM 0.5 MG tablet Commonly known as: KLONOPIN       TAKE these medications    aspirin EC 81 MG tablet Take 81 mg by  mouth daily.   atorvastatin 40 MG tablet Commonly known as: LIPITOR Take 1 tablet (40 mg total) by mouth at bedtime.   celecoxib 200 MG capsule Commonly known as: CELEBREX Take 1 capsule (200 mg total) by mouth 2 (two) times daily.   DULoxetine 30 MG capsule Commonly known as: CYMBALTA TAKE 1 CAPSULE(30 MG) BY MOUTH DAILY   gabapentin 300 MG capsule Commonly known as: NEURONTIN Take 300 mg by mouth 2 (two) times daily.   losartan-hydrochlorothiazide 100-25 MG tablet Commonly known as: HYZAAR TAKE 1 TABLET BY MOUTH DAILY   methocarbamol 500 MG tablet Commonly known as: ROBAXIN Take 1 tablet (500 mg total) by mouth every 6 (six) hours as needed for muscle spasms.   metoprolol tartrate 25 MG tablet Commonly known as: LOPRESSOR Take 25 mg by mouth daily.   nortriptyline 10 MG capsule Commonly known as: PAMELOR Take 2 mg by mouth 2 (two) times daily.   oxyCODONE 5 MG immediate release tablet Commonly known as: Oxy IR/ROXICODONE Take 1-2 tablets (5-10 mg total) by mouth every 4 (four) hours as needed for up to 5 days for moderate pain or severe pain.   pramipexole 1 MG tablet Commonly known as: MIRAPEX Take 1 tablet (1 mg total) by mouth 2 (two) times daily.   senna 8.6 MG Tabs tablet Commonly known as: SENOKOT Take 1 tablet (8.6 mg total) by mouth 2 (two) times daily.          Signed14/03/2021 01/29/2022, 9:53 AM

## 2022-01-26 NOTE — Discharge Instructions (Addendum)
NEUROSURGERY DISCHARGE INSTRUCTIONS  Admission diagnosis: M43.16 Spondylolisthesis of lumbar region M54.42, M54.41, G89.29 Chronic bilateral low back pain with bilateral sciatica  Operative procedure: L3-5 XLIF and PSF  What to do after you leave the hospital:  Recommended diet: regular diet. Increase protein intake to promote wound healing.  Recommended activity: no lifting, driving, or strenuous exercise for 4 weeks . You should walk multiple times per day  Special Instructions  No straining, no heavy lifting > 10lbs x 4 weeks.  Keep incision area clean and dry. May shower in 2 days. No baths or pools for 6 weeks.  Please remove dressing tomorrow, no need to apply a bandage afterwards  You have no sutures to remove, the skin is closed with adhesive  Please avoid NSAIDs for 3 months.  Please take pain medications as directed. Take a stool softener if on pain medications   Please Report any of the following: Nausea or Vomiting, Temperature is greater than 101.72F (38.1C) degrees, Dizziness, Abdominal Pain, Difficulty Breathing or Shortness of Breath, Inability to Eat, drink Fluids, or Take medications, Bleeding, swelling, or drainage from surgical incision sites, New numbness or weakness, and Bowel or bladder dysfunction to the neurosurgeon on call at [phone]  Additional Follow up appointments Please follow up with Manning Charity PA-C in [place] clinic as scheduled in 2-3 weeks   Please see below for scheduled appointments:  Future Appointments  Date Time Provider Department Center  02/11/2022 10:30 AM Susanne Borders, PA [facility] None  03/15/2022  3:30 PM [name], MD [facility] None  04/26/2022  1:45 PM [name], MD [facility] None  11/18/2022  9:15 AM [facility] NURSE HEALTH ADVISOR [facility]

## 2022-01-26 NOTE — Anesthesia Procedure Notes (Signed)
Procedure Name: Intubation Date/Time: 01/26/2022 11:06 AM  Performed by: Reece Agar, CRNAPre-anesthesia Checklist: Patient identified, Emergency Drugs available, Suction available and Patient being monitored Patient Re-evaluated:Patient Re-evaluated prior to induction Oxygen Delivery Method: Circle system utilized Preoxygenation: Pre-oxygenation with 100% oxygen Induction Type: IV induction Ventilation: Mask ventilation without difficulty Laryngoscope Size: McGraph and 3 Grade View: Grade I Tube type: Oral Tube size: 7.0 mm Number of attempts: 1 Airway Equipment and Method: Stylet and Oral airway Placement Confirmation: ETT inserted through vocal cords under direct vision, positive ETCO2 and breath sounds checked- equal and bilateral Secured at: 20 cm Tube secured with: Tape Dental Injury: Teeth and Oropharynx as per pre-operative assessment

## 2022-01-26 NOTE — Op Note (Signed)
Indications: Ms. 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   Preoperative Note:   Risks of surgery discussed include: infection, bleeding, stroke, coma, death, paralysis, CSF leak, nerve/spinal cord injury, numbness, tingling, weakness, complex regional pain syndrome, recurrent stenosis and/or disc herniation, vascular injury, development of instability, neck/back pain, need for further surgery, persistent symptoms, development of deformity, and the risks of anesthesia. The patient understood these risks and agreed to proceed.  NAME OF ANTERIOR PROCEDURE:               1. Anterior lumbar interbody fusion via a right lateral retroperitoneal approach at L3/4 and L4/5 2. Placement of a Lordotic Globus Hedron interbody cage, filled with Demineralized Bone Matrix, at each level  NAME OF POSTERIOR PROCEDURE 1. Posterior instrumentation using Globus Creo Instrumentation 2. Posterolateral fusion, L3/4 and L4/5, utilizing demineralized bone matrix 3. Use of Stereotaxis   PROCEDURE:  Patient was brought to the operating room, intubated, turned to the lateral position.  All pressure points were checked and double-checked.  The patient was prepped and draped in the standard fashion. Prior to prepping, fluoroscopy was brought in and the patient was positioned with a large bump under the contralateral side between the iliac crest and rib cage,  allowing the area between the iliac crest and the lateral aspect of the rib cage to open and increase the ability to reach inferiorly, to facilitate entry into the disc space.  The incision was marked upon the skin both the location of the disc space as well as the superior most aspect of the iliac crest.  Based on the identification of the disc space an incision was prepared, marked upon the skin and eventually was used for our lateral incision.  The fluoroscopy was turned into a cross table A/P image in order to confirm that the patient's spine remained in a perpendicular trajectory to the floor without rotation.  Once confirming that all the pressure points were checked and double-checked and the patient remained in sturdy position strapped down in this slightly jack-knifed lateral position, the patient was prepped and draped in standard fashion.  The skin was injected with local anesthetic, then incised until the abdominal wall fascia was noted.  I bluntly dissected posteriorly until we were able to identify the posterior musculature near petit's triangle.  At this point, using primarily blunt dissection with our finger aided with a metzenbaum scissor, were able to enter the retroperitoneal cavity.  The retroperitoneal potential space was opened further until palpating out the psoas muscle, the medial aspect of the iliac crest, the medial aspect of the last rib and continued to define the retroperitoneal space with blunt dissection in order to facilitate safe placement of our dilators.    While protecting by dissecting directly onto a finger in the retroperitoneum, the retroperitoneal space was entered safely from the lateral incision and the initial dilator placed onto the muscle belly of the psoas.  While directly stimulating the dilator and after radiographically confirming our location relative to the disc space, I placed the dilator  through the psoas.  The dilators were stimulated to ensure remaining  safely away from any of the lumbar plexus nerves; the dilators were repositioned until no pathologic stimulation was appreciated.  Once I had confirmed the location of our initial dilator radiographically, a K-wire secured the dilator into the L4/5 disc space and confirmed position under A/P and lateral fluoroscopy.  At this point, I dilated up with direct stimulation to confirm lack of pathologic stimulation.  Once all the dilators were in position, I placed in the retractor and secured it onto the table, locked into position and confirmed under A/P and lateral fluoroscopy to confirm our approach angle to the disc space as well as location relative to the disc space.  I then placed the muscle stimulator in through the working channel down to the vertebral body, stimulating the entire lateral surface of the vertebral body and any of the visualized psoas muscle that was adjacent to the retractor, confirming again the safe passage to the psoas before we began performing the discectomy.  At this point, we began our discectomy at L4/5.  The disc was incised laterally throughout the extent of our exposure. Using a combination of pituitary rongeurs, Kerrison rongeurs, rasps, curettes of various sorts, we were able to begin to clean out the disc space.  Once we had cleaned out the majority of the disc space, we then cut the lateral annulus with a cob, breaking the lateral annual attachments on the contralateral side by subtly working the cob through the annulus while using flouroscopy.  Care was taken not to extend further than required after cutting the annular attachments.  After this had been performed, we prepared the endplates for placement of our graft, sized a graft to the disc space by serially dilating up in trial sizes until we confirmed that our graft would be well positioned, allowing distraction while maintaining good grip.  This was confirmed under A/P and lateral fluoroscopy in order to ensure its  placement as an eventual trial for placement of our final graft.  We irrigated with bacteriostatic saline.  Once confirmed placement, the Hedron implant filled with allograft was impacted into position at L4/5.   Through a combination of intradiscal distraction and anterior releasing, we were able to correct the anterior deformity during disc preparation and placement of the graft.  After performing the lateral lumbar interbody procedure at L4/5, attention was moved to the L3/4 level.   While protecting by dissecting directly onto a finger in the retroperitoneum, the retroperitoneal space was entered safely from the lateral incision and the initial dilator placed onto the muscle belly of the psoas.  While directly stimulating the dilator and after radiographically confirming our location relative to the disc space, I placed the dilator through the psoas.  The dilators were stimulated to ensure remaining safely away from any of the lumbar plexus nerves; the dilators were repositioned until no pathologic stimulation was appreciated.  Once I had confirmed the location of our initial dilator radiographically, a K-wire secured the dilator into the L3/4 disc space and confirmed position under A/P and lateral fluoroscopy.  At this point, I dilated up with direct stimulation to confirm lack of pathologic stimulation.  Once all the dilators were in position, I placed in the retractor and secured it to the table, then confirmed with flouroscopy.  I then placed the muscle stimulator in through the working channel down to the vertebral body, stimulating the entire lateral surface of the vertebral body and any of  the visualized psoas muscle that was adjacent to the retractor, confirming again the safe passage to the psoas before we began performing the discectomy.  At this point, we began our discectomy at L3/4.  The disc was incised laterally throughout the extent of our exposure. Using a combination of pituitary  rongeurs, Kerrison rongeurs, rasps, curettes of various sorts, we were able to begin to clean out the disc space.  Once we had cleaned out the majority of the disc space, we then cut the lateral annulus with a cob, breaking the lateral annual attachments on the contralateral side by subtly working the cob through the annulus while using flouroscopy.  Care was taken not to extend further than required after cutting the annular attachments.  After this had been performed, we prepared the endplates for placement of our graft, sized a graft to the disc space by serially dilating up in trial sizes until we confirmed that our graft would be well positioned, allowing distraction while maintaining good grip.  This was confirmed under A/P and lateral fluoroscopy in order to ensure its placement as an eventual trial for placement of our final graft.  We irrigated with bacteriostatic saline.  Once confirmed placement, the Hedron graft was impacted into position at L3/4.       At this point, final radiographs were performed, and we began closure.  The wound was closed using 0 Vicryl interrupted suture in the fascia and 2-0 Vicryl inverted suture were placed in the subcutaneous tissue and dermis. 3-0 monocryl was used for final closure. Dermabond was used to close the skin.    After closing the anterior part in layers, the patient was repositioned into prone position.  All pressure points were checked and double-checked.  The posterior operative site was prepped and draped in standard fashion.  The stereotactic array was placed.  Stereotactic images were acquired using intraoperative CT scanning.  This was registered to the patient.  Using stereotaxis, screw trajectories were planned and incisions made.  The pedicles from L3 to L5 were cannulated bilaterally and K wires used to secure the tracks.  We then utilized a stereotactic screwdriver to place pedicle screws from L3 to L5.  At each level, 6.5x14mm Globus Creo  pedicle screws were placed.  Once the screws were placed, the screw extensions were then linked, a path was formed for the rod and a rod was utilized to connect the screws.  We then compressed, torqued / counter-torqued and removed the screw assembly. Once performed on each side, the C-arm was brought back and to take confirmatory CT scan showing appropriate placement of all instrumentation and anatomic alignment.    Posterolateral arthrodesis was performed at L3-L4 and L4-5 utilizing demineralized bone matrix.  Again we confirmed radiographically and began our closure.  The wound was closed using 0 Vicryl interrupted suture in the fascia, 2-0 Vicryl inverted suture were placed in the subcutaneous tissue and dermis. 3-0 monocryl was used for final closure. Dermabond was used to close the skin.    Needle, lap and all counts were correct at the end of the case.    There was no pathologic change in the neuromonitoring during the procedure.   Manning Charity PA assisted in the entire procedure. An assistant was required for this procedure due to the complexity.  The assistant provided assistance in tissue manipulation and suction, and was required for the successful and safe performance of the procedure. I performed the critical portions of the procedure.   [name]  MD Neurosurgery

## 2022-01-27 ENCOUNTER — Encounter: Payer: Self-pay | Admitting: Neurosurgery

## 2022-01-27 ENCOUNTER — Encounter: Payer: Medicare Other | Admitting: Neurosurgery

## 2022-01-27 LAB — TROPONIN I (HIGH SENSITIVITY): Troponin I (High Sensitivity): 6 ng/L (ref <18)

## 2022-01-27 MED ORDER — KETOROLAC TROMETHAMINE 15 MG/ML IJ SOLN
INTRAMUSCULAR | Status: AC
  Administered 2022-01-27: 15 mg via INTRAVENOUS
  Filled 2022-01-27: qty 1

## 2022-01-27 MED ORDER — METOPROLOL TARTRATE 25 MG PO TABS
ORAL_TABLET | ORAL | Status: AC
  Administered 2022-01-27: 25 mg via ORAL
  Filled 2022-01-27: qty 1

## 2022-01-27 MED ORDER — OXYCODONE HCL 5 MG PO TABS
ORAL_TABLET | ORAL | Status: AC
  Filled 2022-01-27: qty 2

## 2022-01-27 MED ORDER — METHOCARBAMOL 500 MG PO TABS
ORAL_TABLET | ORAL | Status: AC
  Filled 2022-01-27: qty 1

## 2022-01-27 MED ORDER — LOSARTAN POTASSIUM 50 MG PO TABS
ORAL_TABLET | ORAL | Status: AC
  Administered 2022-01-27: 100 mg via ORAL
  Filled 2022-01-27: qty 2

## 2022-01-27 MED ORDER — HYDROCHLOROTHIAZIDE 25 MG PO TABS
ORAL_TABLET | ORAL | Status: AC
  Administered 2022-01-27: 25 mg via ORAL
  Filled 2022-01-27: qty 1

## 2022-01-27 MED ORDER — ACETAMINOPHEN 500 MG PO TABS
ORAL_TABLET | ORAL | Status: AC
  Filled 2022-01-27: qty 2

## 2022-01-27 MED ORDER — SENNA 8.6 MG PO TABS
ORAL_TABLET | ORAL | Status: AC
  Administered 2022-01-27: 8.6 mg via ORAL
  Filled 2022-01-27: qty 1

## 2022-01-27 MED ORDER — KETOROLAC TROMETHAMINE 15 MG/ML IJ SOLN
INTRAMUSCULAR | Status: AC
  Filled 2022-01-27: qty 1

## 2022-01-27 MED ORDER — HYDROMORPHONE HCL 1 MG/ML IJ SOLN
INTRAMUSCULAR | Status: AC
  Filled 2022-01-27: qty 0.5

## 2022-01-27 MED ORDER — LOSARTAN POTASSIUM 50 MG PO TABS
ORAL_TABLET | ORAL | Status: AC
  Filled 2022-01-27: qty 1

## 2022-01-27 MED ORDER — GABAPENTIN 300 MG PO CAPS
ORAL_CAPSULE | ORAL | Status: AC
  Administered 2022-01-27: 300 mg
  Filled 2022-01-27: qty 1

## 2022-01-27 MED ORDER — ASPIRIN 81 MG PO CHEW
CHEWABLE_TABLET | ORAL | Status: AC
  Administered 2022-01-27: 81 mg
  Filled 2022-01-27: qty 1

## 2022-01-27 NOTE — Progress Notes (Signed)
Physical Therapy Treatment Patient Details Name: Janet [name] MRN: [id number] DOB: January 23, 1951 Today's Date: 01/27/2022   History of Present Illness s/p L3-5 XLIF and PSF for spondylolisthesis and radiculopathy. S/P ACDF C4-7 11/03/21.    PT Comments    Patient received in bed, she is agreeable to PT session. She is min A for bed mobility, cues for rolling to maintain back precautions. Min guard for sit to stand with cues for hand placement. Patient is able to ambulate 100 feet with RW and min guard/supervision.  She will continue to benefit from skilled PT while here to improve functional independence and safety.      Recommendations for follow up therapy are one component of a multi-disciplinary discharge planning process, led by the attending physician.  Recommendations may be updated based on patient status, additional functional criteria and insurance authorization.  Follow Up Recommendations  Home health PT     Assistance Recommended at Discharge Frequent or constant Supervision/Assistance  Patient can return home with the following A little help with walking and/or transfers;A little help with bathing/dressing/bathroom;Assist for transportation;Help with stairs or ramp for entrance;Assistance with cooking/housework   Equipment Recommendations  None recommended by PT (patient has rollator)    Recommendations for Other Services       Precautions / Restrictions Precautions Precautions: Fall;Back Precaution Booklet Issued: No Restrictions Weight Bearing Restrictions: No     Mobility  Bed Mobility Overal bed mobility: Needs Assistance Bed Mobility: Supine to Sit, Sit to Supine, Rolling, Sidelying to Sit Rolling: Min guard Sidelying to sit: Min guard Supine to sit: Min guard Sit to supine: Min guard   General bed mobility comments: Cues for rolling    Transfers Overall transfer level: Needs assistance Equipment used: Rolling walker (2 wheels) Transfers: Sit to/from  Stand Sit to Stand: Min guard                Ambulation/Gait Ambulation/Gait assistance: Min guard Gait Distance (Feet): 100 Feet Assistive device: Rolling walker (2 wheels) Gait Pattern/deviations: Step-through pattern, Decreased step length - right, Decreased step length - left, Decreased stride length Gait velocity: decr     General Gait Details: Steady with RW, improved this pm from earlier session. Ambulated to restroom also with min A for donning/doffing undergarments.   Stairs             Wheelchair Mobility    Modified Rankin (Stroke Patients Only)       Balance Overall balance assessment: Modified Independent Sitting-balance support: Feet supported Sitting balance-Leahy Scale: Good     Standing balance support: Bilateral upper extremity supported, During functional activity, Reliant on assistive device for balance Standing balance-Leahy Scale: Good Standing balance comment: able to static stand without UE support                            Cognition Arousal/Alertness: Awake/alert Behavior During Therapy: WFL for tasks assessed/performed Overall Cognitive Status: Within Functional Limits for tasks assessed                                          Exercises      General Comments        Pertinent Vitals/Pain Pain Assessment Pain Assessment: Faces Faces Pain Scale: Hurts little more Pain Location: back Pain Descriptors / Indicators: Discomfort, Sore, Grimacing, Guarding Pain Intervention(s): Monitored during session, Repositioned  Home Living Family/patient expects to be discharged to:: Private residence Living Arrangements: Children Available Help at Discharge: Family;Available PRN/intermittently Type of Home: House Home Access: Level entry       Home Layout: One level;Able to live on main level with bedroom/bathroom Home Equipment: Cane - single point;Rollator (4 wheels);Wheelchair - manual;Hand held  shower head      Prior Function            PT Goals (current goals can now be found in the care plan section) Acute Rehab PT Goals Patient Stated Goal: to return home PT Goal Formulation: With patient/family Time For Goal Achievement: 02/03/22 Potential to Achieve Goals: Good Progress towards PT goals: Progressing toward goals    Frequency    BID      PT Plan Current plan remains appropriate    Co-evaluation              AM-PAC PT "6 Clicks" Mobility   Outcome Measure  Help needed turning from your back to your side while in a flat bed without using bedrails?: A Little Help needed moving from lying on your back to sitting on the side of a flat bed without using bedrails?: A Little Help needed moving to and from a bed to a chair (including a wheelchair)?: A Little Help needed standing up from a chair using your arms (e.g., wheelchair or bedside chair)?: A Little Help needed to walk in hospital room?: A Little Help needed climbing 3-5 steps with a railing? : A Little 6 Click Score: 18    End of Session Equipment Utilized During Treatment: Gait belt Activity Tolerance: Patient tolerated treatment well Patient left: in bed;with call bell/phone within reach;with family/visitor present Nurse Communication: Mobility status PT Visit Diagnosis: Pain;Difficulty in walking, not elsewhere classified (R26.2);Other abnormalities of gait and mobility (R26.89) Pain - part of body:  (back)     Time: 7564-3329 PT Time Calculation (min) (ACUTE ONLY): 21 min  Charges:  $Gait Training: 8-22 mins                     Leshonda Galambos, PT, GCS 01/27/22,2:46 PM

## 2022-01-27 NOTE — Evaluation (Signed)
Physical Therapy Evaluation Patient Details Name: Janet [name] MRN: [id number] DOB: Jan 26, 1951 Today's Date: 01/27/2022  History of Present Illness  s/p L3-5 XLIF and PSF for spondylolisthesis and radiculopathy. S/P ACDF C4-7 11/03/21.  Clinical Impression  Patient received in bed, sister present in room. Sister here from [facility] to stay with patient for 2 weeks while she recovers. Patient lives with her daughter, but the daughter works and does not get home until 4pm. Patient requires min A to move from supine to sit using bed rails. This was performed with head of bed in upright position, she will required more assistance when on flat bed log rolling. She is able to stand with min guard and ambulated min guard 80 feet with RW. Patient will continue to benefit from skilled PT while here to improve functional independence, strength and safety with mobility.          Recommendations for follow up therapy are one component of a multi-disciplinary discharge planning process, led by the attending physician.  Recommendations may be updated based on patient status, additional functional criteria and insurance authorization.  Follow Up Recommendations Home health PT      Assistance Recommended at Discharge Frequent or constant Supervision/Assistance  Patient can return home with the following  A little help with walking and/or transfers;A little help with bathing/dressing/bathroom;Assist for transportation;Help with stairs or ramp for entrance;Assistance with cooking/housework    Equipment Recommendations None recommended by PT  Recommendations for Other Services       Functional Status Assessment Patient has had a recent decline in their functional status and demonstrates the ability to make significant improvements in function in a reasonable and predictable amount of time.     Precautions / Restrictions Precautions Precautions: Fall;Back Precaution Booklet Issued: No Restrictions Weight Bearing  Restrictions: No      Mobility  Bed Mobility Overal bed mobility: Needs Assistance Bed Mobility: Supine to Sit, Rolling, Sidelying to Sit Rolling: Min assist Sidelying to sit: Min assist Supine to sit: Min assist          Transfers Overall transfer level: Needs assistance Equipment used: Rolling walker (2 wheels) Transfers: Sit to/from Stand Sit to Stand: Min guard                Ambulation/Gait Ambulation/Gait assistance: Min guard Gait Distance (Feet): 80 Feet Assistive device: Rolling walker (2 wheels) Gait Pattern/deviations: Step-through pattern, Decreased step length - right, Decreased step length - left, Decreased stride length Gait velocity: decr     General Gait Details: steady, slow pace gait. Leaning heavily on RW.  Stairs            Wheelchair Mobility    Modified Rankin (Stroke Patients Only)       Balance Overall balance assessment: Needs assistance Sitting-balance support: Feet supported Sitting balance-Leahy Scale: Good     Standing balance support: Bilateral upper extremity supported, During functional activity, Reliant on assistive device for balance Standing balance-Leahy Scale: Good                               Pertinent Vitals/Pain Pain Assessment Pain Assessment: Faces Faces Pain Scale: Hurts little more Pain Location: back Pain Descriptors / Indicators: Discomfort, Sore, Grimacing, Guarding Pain Intervention(s): Monitored during session, Premedicated before session, Repositioned    Home Living Family/patient expects to be discharged to:: Private residence Living Arrangements: Children Available Help at Discharge: Family;Available PRN/intermittently Type of Home: House Home Access: Level  entry       Home Layout: One level;Able to live on main level with bedroom/bathroom Home Equipment: Cane - single point;Rollator (4 wheels);Wheelchair - manual      Prior Function Prior Level of Function :  Independent/Modified Independent             Mobility Comments: occasionally uses SPC for community distances ADLs Comments: indep     Hand Dominance        Extremity/Trunk Assessment   Upper Extremity Assessment Upper Extremity Assessment: Defer to OT evaluation    Lower Extremity Assessment Lower Extremity Assessment: Generalized weakness    Cervical / Trunk Assessment Cervical / Trunk Assessment: Back Surgery;Neck Surgery  Communication   Communication: No difficulties  Cognition Arousal/Alertness: Awake/alert Behavior During Therapy: WFL for tasks assessed/performed Overall Cognitive Status: Within Functional Limits for tasks assessed                                          General Comments      Exercises     Assessment/Plan    PT Assessment Patient needs continued PT services  PT Problem List Decreased activity tolerance;Decreased mobility;Pain;Obesity;Decreased knowledge of use of DME;Decreased knowledge of precautions;Decreased strength;Decreased skin integrity       PT Treatment Interventions DME instruction;Therapeutic exercise;Gait training;Stair training;Functional mobility training;Therapeutic activities;Patient/family education;Neuromuscular re-education    PT Goals (Current goals can be found in the Care Plan section)  Acute Rehab PT Goals Patient Stated Goal: to return home PT Goal Formulation: With patient/family Time For Goal Achievement: 02/03/22 Potential to Achieve Goals: Good    Frequency BID     Co-evaluation               AM-PAC PT "6 Clicks" Mobility  Outcome Measure Help needed turning from your back to your side while in a flat bed without using bedrails?: A Lot Help needed moving from lying on your back to sitting on the side of a flat bed without using bedrails?: A Lot Help needed moving to and from a bed to a chair (including a wheelchair)?: A Little Help needed standing up from a chair using your  arms (e.g., wheelchair or bedside chair)?: A Little Help needed to walk in hospital room?: A Little Help needed climbing 3-5 steps with a railing? : A Little 6 Click Score: 16    End of Session Equipment Utilized During Treatment: Gait belt Activity Tolerance: Patient tolerated treatment well Patient left: in chair;with family/visitor present;with nursing/sitter in room Nurse Communication: Mobility status PT Visit Diagnosis: Pain;Difficulty in walking, not elsewhere classified (R26.2);Other abnormalities of gait and mobility (R26.89) Pain - Right/Left:  (central) Pain - part of body:  (back)    Time: [id number] PT Time Calculation (min) (ACUTE ONLY): 23 min   Charges:   PT Evaluation $PT Eval Moderate Complexity: 1 Mod PT Treatments $Gait Training: 8-22 mins        Michayla Mcneil, PT, GCS 01/27/22,10:00 AM

## 2022-01-27 NOTE — Anesthesia Postprocedure Evaluation (Signed)
Anesthesia Post Note  Patient: Janet [name]  Procedure(s) Performed: L3-5 LATERAL LUMBAR INTERBODY FUSION WITH POSTERIOR SPINAL FUSION (Spine Lumbar) APPLICATION OF INTRAOPERATIVE CT SCAN (Spine Lumbar)  Patient location during evaluation: PACU Anesthesia Type: General Level of consciousness: awake and alert Pain management: pain level controlled Vital Signs Assessment: post-procedure vital signs reviewed and stable Respiratory status: spontaneous breathing, nonlabored ventilation, respiratory function stable and patient connected to nasal cannula oxygen Cardiovascular status: blood pressure returned to baseline and stable Postop Assessment: no apparent nausea or vomiting Anesthetic complications: no  No notable events documented.   Last Vitals:  Vitals:   01/26/22 1651 01/27/22 0050  BP: (!) 178/95 (!) 164/92  Pulse: 72 70  Resp: 14 16  Temp: (!) 36.3 C 36.7 C  SpO2: 100% 100%    Last Pain:  Vitals:   01/27/22 0359  TempSrc:   PainSc: 10-Worst pain ever                 Stephanie Coup

## 2022-01-27 NOTE — Evaluation (Signed)
Occupational Therapy Evaluation Patient Details Name: Janet [name] MRN: [id number] DOB: 11-25-1950 Today's Date: 01/27/2022   History of Present Illness s/p L3-5 XLIF and PSF for spondylolisthesis and radiculopathy. S/P ACDF C4-7 11/03/21.   Clinical Impression   Pt seen for OT evaluation this date, POD#1 from above surgery. Prior to hospital admission, pt was mod independent but has been having increased difficulty with all aspects of mobility and ADL due to back pain. Pt's sister is planning to stay with her for 2-3 weeks to assist as needed. Pt currently requires MIN A for LB ADL tasks. Pt endorses fatigue after PT and after pain medication so ADL mobility deferred. Pt/family educated in spinal body mechanics, self care skills, bed mobility and functional transfer training, AE/DME for bathing, dressing, and toileting needs, and home/routines modifications and falls prevention strategies to maximize safety and functional independence while minimizing falls risk and maintaining precautions. Pt/family verbalized understanding of all education/training provided. Pt will benefit from additional OT services before and after discharge to maximize return to PLOF.      Recommendations for follow up therapy are one component of a multi-disciplinary discharge planning process, led by the attending physician.  Recommendations may be updated based on patient status, additional functional criteria and insurance authorization.   Follow Up Recommendations  Home health OT     Assistance Recommended at Discharge Intermittent Supervision/Assistance  Patient can return home with the following A little help with walking and/or transfers;A little help with bathing/dressing/bathroom;Assistance with cooking/housework;Assist for transportation;Help with stairs or ramp for entrance    Functional Status Assessment  Patient has had a recent decline in their functional status and demonstrates the ability to make  significant improvements in function in a reasonable and predictable amount of time.  Equipment Recommendations  BSC/3in1    Recommendations for Other Services       Precautions / Restrictions Precautions Precautions: Fall;Back Precaution Booklet Issued: No Restrictions Weight Bearing Restrictions: No      Mobility Bed Mobility               General bed mobility comments: NT, up in recliner    Transfers                   General transfer comment: deferred, pt very fatigued after recent pain medication and PT session      Balance                                           ADL either performed or assessed with clinical judgement   ADL                                         General ADL Comments: Pt requires MIN A for LB ADL tasks. Family present and reports ability to assist for first 2 weeks     Vision         Perception     Praxis      Pertinent Vitals/Pain Pain Assessment Pain Assessment: 0-10 Pain Score: 5  Pain Location: back Pain Descriptors / Indicators: Discomfort, Sore, Grimacing, Guarding Pain Intervention(s): Limited activity within patient's tolerance, Monitored during session, Premedicated before session     Hand Dominance     Extremity/Trunk Assessment Upper Extremity Assessment Upper Extremity Assessment: Overall [facility]  for tasks assessed   Lower Extremity Assessment Lower Extremity Assessment: Generalized weakness   Cervical / Trunk Assessment Cervical / Trunk Assessment: Back Surgery;Neck Surgery   Communication Communication Communication: No difficulties   Cognition Arousal/Alertness: Suspect due to medications Behavior During Therapy: WFL for tasks assessed/performed Overall Cognitive Status: Within Functional Limits for tasks assessed                                 General Comments: very sleepy     General Comments       Exercises Other Exercises Other  Exercises: Pt/family educated in home/routines modifications, falls prevention, AE/DME, and spinal body mechanics   Shoulder Instructions      Home Living Family/patient expects to be discharged to:: Private residence Living Arrangements: Children Available Help at Discharge: Family;Available PRN/intermittently Type of Home: House Home Access: Level entry     Home Layout: One level;Able to live on main level with bedroom/bathroom     Bathroom Shower/Tub: Tub/shower unit         Home Equipment: Cane - single point;Rollator (4 wheels);Wheelchair - manual;Hand held shower head          Prior Functioning/Environment Prior Level of Function : Independent/Modified Independent             Mobility Comments: occasionally uses SPC for community distances ADLs Comments: indep        OT Problem List: Decreased strength;Pain;Impaired balance (sitting and/or standing);Decreased knowledge of use of DME or AE;Decreased knowledge of precautions      OT Treatment/Interventions: Self-care/ADL training;Therapeutic exercise;Therapeutic activities;DME and/or AE instruction;Patient/family education;Balance training    OT Goals(Current goals can be found in the care plan section) Acute Rehab OT Goals Patient Stated Goal: get better and go home OT Goal Formulation: With patient/family Time For Goal Achievement: 02/10/22 Potential to Achieve Goals: Good ADL Goals Pt Will Perform Lower Body Dressing: with modified independence;with adaptive equipment;sit to/from stand Pt Will Transfer to Toilet: with modified independence;ambulating (LRAD) Pt Will Perform Toileting - Clothing Manipulation and hygiene: with modified independence Additional ADL Goal #1: Pt will verbalize 100% of precautions Additional ADL Goal #2: Pt will complete all aspects of bathing with mod indep, primarily from seated position.  OT Frequency: Min 2X/week    Co-evaluation              AM-PAC OT "6 Clicks"  Daily Activity     Outcome Measure Help from another person eating meals?: None Help from another person taking care of personal grooming?: None Help from another person toileting, which includes using toliet, bedpan, or urinal?: A Little Help from another person bathing (including washing, rinsing, drying)?: A Little Help from another person to put on and taking off regular upper body clothing?: None Help from another person to put on and taking off regular lower body clothing?: A Little 6 Click Score: 21   End of Session    Activity Tolerance: Patient tolerated treatment well;Patient limited by fatigue Patient left: in chair;with call bell/phone within reach;with family/visitor present  OT Visit Diagnosis: Other abnormalities of gait and mobility (R26.89);Pain Pain - Right/Left:  (low back)                Time: 8182-9937 OT Time Calculation (min): 18 min Charges:  OT General Charges $OT Visit: 1 Visit OT Evaluation $OT Eval Low Complexity: 1 Low OT Treatments $Self Care/Home Management : 8-22 mins  Arman Filter., MPH, MS,  OTR/L ascom [phone] 01/27/22, 12:49 PM

## 2022-01-27 NOTE — Progress Notes (Signed)
Informed attending of area on left mid back, raised and hard; size of closed fist.  Complains of 10/10 pain after medication. Verbal order given to give medication early if needed to control pain.

## 2022-01-27 NOTE — Plan of Care (Signed)
Progressing

## 2022-01-27 NOTE — Progress Notes (Signed)
    Attending Progress Note  History: Janet [name] is s/p L3-5 XLIF and PSF for spondylolisthesis and radiculopathy.   POD1: Pt had increased back pain overnight and swelling around left sided posterior incision. This morning she reports improved pain but admits to chest tightness. She reports experiencing this with her previous surgery.  Physical Exam: Vitals:   01/27/22 0050 01/27/22 0900  BP: (!) 164/92 118/77  Pulse: 70 96  Resp: 16 16  Temp: 98 F (36.7 C)   SpO2: 100% 100%    Drowsy but arouses easily Ox3 CNI  Strength:5/5 throughout BLE Incisions covered with mildly blood tinged dressings. No obvious raised hematoma.   Data:  Other tests/results: none   Assessment/Plan:  Janet [name] is a 71 y.o s/p L3-5 XLIF and PSF with improved leg pain thus far,  - mobilize - pain control - DVT prophylaxis - PTOT - ordered Troponin and EKG for ACS r/o   Manning Charity PA-C Department of Neurosurgery

## 2022-01-28 ENCOUNTER — Encounter: Payer: Self-pay | Admitting: Neurosurgery

## 2022-01-28 MED ORDER — OXYCODONE HCL 5 MG PO TABS: 5.0000 mg | ORAL_TABLET | ORAL | 0 refills | Status: AC | PRN

## 2022-01-28 MED ORDER — SENNA 8.6 MG PO TABS: 1.0000 | ORAL_TABLET | Freq: Two times a day (BID) | ORAL | 0 refills | Status: DC

## 2022-01-28 MED ORDER — CELECOXIB 200 MG PO CAPS: 200.0000 mg | ORAL_CAPSULE | Freq: Two times a day (BID) | ORAL | 0 refills | Status: DC

## 2022-01-28 MED ORDER — METHOCARBAMOL 500 MG PO TABS: 500.0000 mg | ORAL_TABLET | Freq: Four times a day (QID) | ORAL | 0 refills | Status: DC | PRN

## 2022-01-28 MED ORDER — OXYCODONE HCL 5 MG PO TABS
5.0000 mg | ORAL_TABLET | ORAL | Status: DC | PRN
  Administered 2022-01-28: 5 mg via ORAL
  Filled 2022-01-28: qty 1

## 2022-01-28 MED ORDER — CELECOXIB 200 MG PO CAPS
200.0000 mg | ORAL_CAPSULE | Freq: Two times a day (BID) | ORAL | Status: DC
  Administered 2022-01-28 – 2022-01-29 (×2): 200 mg via ORAL
  Filled 2022-01-28 (×2): qty 1

## 2022-01-28 NOTE — Progress Notes (Signed)
Occupational Therapy Treatment Patient Details Name: Janet [name] MRN: [id number] DOB: 10-24-1950 Today's Date: 01/28/2022   History of present illness s/p L3-5 XLIF and PSF for spondylolisthesis and radiculopathy. S/P ACDF C4-7 11/03/21.   OT comments  Pt seen for OT tx. Pt standing in room, RN preparing to provide pt with medication. Pt endorsing significant low back and leg pain, wincing, and restless, unable to get comfortable in sitting/standing and also endorsing "chest tightness" (she clarifies the tightness is not pain when asked). Pt ambulates in room with RW and supervision with hopes for improvement in pain after medication. Pt/sister instructed in cognitive behavioral pain coping strategies to support improved comprehensive pain mgt. Pt/sister also further instructed in body mechanics and ADL/mobility including AE/DME, adaptive strategies, home/routines modifications, and falls prevention. Sister expressed interest in a back brace for the pt for comfort, as she states the pt is very active and sister was concerned for pt to over exert herself. MD/PA/RN notified via secure chat. Pt limited by pain this date. Continues to benefit from skilled OT services.    Recommendations for follow up therapy are one component of a multi-disciplinary discharge planning process, led by the attending physician.  Recommendations may be updated based on patient status, additional functional criteria and insurance authorization.    Follow Up Recommendations  Home health OT     Assistance Recommended at Discharge Intermittent Supervision/Assistance  Patient can return home with the following  A little help with walking and/or transfers;A little help with bathing/dressing/bathroom;Assistance with cooking/housework;Assist for transportation;Help with stairs or ramp for entrance   Equipment Recommendations  BSC/3in1    Recommendations for Other Services      Precautions / Restrictions  Precautions Precautions: Fall;Back Precaution Booklet Issued: Yes (comment) Restrictions Weight Bearing Restrictions: No       Mobility Bed Mobility               General bed mobility comments: NT, pt standing at EOB upon OT's arrival    Transfers Overall transfer level: Needs assistance Equipment used: Rolling walker (2 wheels) Transfers: Sit to/from Stand Sit to Stand: Supervision           General transfer comment: cautious, painful     Balance Overall balance assessment: Modified Independent                                         ADL either performed or assessed with clinical judgement   ADL Overall ADL's : Needs assistance/impaired                                       General ADL Comments: Pt requires MIN A for LB ADL tasks.    Extremity/Trunk Assessment              Vision       Perception     Praxis      Cognition Arousal/Alertness: Awake/alert Behavior During Therapy: WFL for tasks assessed/performed Overall Cognitive Status: Within Functional Limits for tasks assessed                                          Exercises Other Exercises Other Exercises: Pt/sister instructed in body mechanics and ADL/mobility,  cognitive behavioral pain coping strategies    Shoulder Instructions       General Comments      Pertinent Vitals/ Pain       Pain Assessment Pain Assessment: 0-10 Pain Score: 8  Pain Location: back Pain Descriptors / Indicators: Sharp, Guarding, Grimacing, Restless Pain Intervention(s): Limited activity within patient's tolerance, Monitored during session, Repositioned, Patient requesting pain meds-RN notified, RN gave pain meds during session, Relaxation, Utilized relaxation techniques  Home Living                                          Prior Functioning/Environment              Frequency  Min 2X/week        Progress Toward  Goals  OT Goals(current goals can now be found in the care plan section)  Progress towards OT goals: Progressing toward goals  Acute Rehab OT Goals Patient Stated Goal: get better and go home OT Goal Formulation: With patient/family Time For Goal Achievement: 02/10/22 Potential to Achieve Goals: Good  Plan Discharge plan remains appropriate;Frequency remains appropriate    Co-evaluation                 AM-PAC OT "6 Clicks" Daily Activity     Outcome Measure   Help from another person eating meals?: None Help from another person taking care of personal grooming?: None Help from another person toileting, which includes using toliet, bedpan, or urinal?: A Little Help from another person bathing (including washing, rinsing, drying)?: A Little Help from another person to put on and taking off regular upper body clothing?: None Help from another person to put on and taking off regular lower body clothing?: A Little 6 Click Score: 21    End of Session Equipment Utilized During Treatment: Rolling walker (2 wheels)  OT Visit Diagnosis: Other abnormalities of gait and mobility (R26.89);Pain Pain - Right/Left:  (low back)   Activity Tolerance Patient limited by pain   Patient Left in chair;with call bell/phone within reach;with family/visitor present   Nurse Communication Mobility status;Patient requests pain meds        Time: 1224-8250 OT Time Calculation (min): 39 min  Charges: OT General Charges $OT Visit: 1 Visit OT Treatments $Self Care/Home Management : 38-52 mins  Arman Filter., MPH, MS, OTR/L ascom [phone] 01/28/22, 10:27 AM

## 2022-01-28 NOTE — Plan of Care (Signed)
  Problem: Pain Management: Goal: Pain level will decrease Outcome: Progressing   Problem: Skin Integrity: Goal: Will show signs of wound healing Outcome: Progressing   Problem: Activity: Goal: Risk for activity intolerance will decrease Outcome: Progressing   Problem: Nutrition: Goal: Adequate nutrition will be maintained Outcome: Progressing   

## 2022-01-28 NOTE — Progress Notes (Signed)
    Attending Progress Note  History: Janet [name] is s/p L3-5 XLIF and PSF for spondylolisthesis and radiculopathy.   POD2: Improved pain this morning. States her pain was well controlled on PO medications overnight and is eager to d/c home this morning. Her pre-op leg pain is much improved!  POD1: Pt had increased back pain overnight and swelling around left sided posterior incision. This morning she reports improved pain but admits to chest tightness. She reports experiencing this with her previous surgery.  Physical Exam: Vitals:   01/28/22 0400 01/28/22 0700  BP: 124/66 129/64  Pulse: 93 99  Resp: 18 17  Temp: 97.9 F (36.6 C) 98.1 F (36.7 C)  SpO2: 93% 97%    Drowsy but arouses easily Ox3 CNI  Strength:5/5 throughout BLE Incisions covered with mildly blood tinged dressings. No obvious raised hematoma.   Data:  Other tests/results: none   Assessment/Plan:  Janet [name] is a 71 y.o s/p L3-5 XLIF and PSF with improved leg pain thus far,  - mobilize - pain control - DVT prophylaxis - PTOT; dispo planning underway - ACS r/o negative yesterday   Manning Charity PA-C Department of Neurosurgery

## 2022-01-28 NOTE — Progress Notes (Signed)
Physical Therapy Treatment Patient Details Name: Kanoelani Dobies MRN: [id number] DOB: [birth year] Today's Date: 01/28/2022   History of Present Illness s/p L3-5 XLIF and PSF for spondylolisthesis and radiculopathy. S/P ACDF C4-7 11/03/21.    PT Comments    Therapist returned, pt still resting in same position in bed. Sister at bedside stating she had been reaching for items that were not there earlier. Pt required increased time to transfer to EOB and repeated vc's to maintain focus. Gait training with RW, CGA, with decreased step length and several occasions of hitting the wall/doorframe with walker. Pt may not be appropriate for safe d/c home with sister this date due to high fall risk and decreased ability to maintain focus on tasks.    Recommendations for follow up therapy are one component of a multi-disciplinary discharge planning process, led by the attending physician.  Recommendations may be updated based on patient status, additional functional criteria and insurance authorization.  Follow Up Recommendations  Home health PT     Assistance Recommended at Discharge Frequent or constant Supervision/Assistance  Patient can return home with the following A little help with walking and/or transfers;A little help with bathing/dressing/bathroom;Assist for transportation;Help with stairs or ramp for entrance;Assistance with cooking/housework   Equipment Recommendations  None recommended by PT    Recommendations for Other Services       Precautions / Restrictions Precautions Precautions: Fall;Back Precaution Booklet Issued: Yes (comment) Restrictions Weight Bearing Restrictions: No     Mobility  Bed Mobility Overal bed mobility: Needs Assistance Bed Mobility: Supine to Sit, Sit to Supine, Rolling, Sidelying to Sit Rolling: Min guard Sidelying to sit: Min guard       General bed mobility comments: Increased time to complete    Transfers Overall transfer level: Needs  assistance Equipment used: Rolling walker (2 wheels) Transfers: Sit to/from Stand Sit to Stand: Supervision                Ambulation/Gait Ambulation/Gait assistance: Min guard Gait Distance (Feet): 100 Feet Assistive device: Rolling walker (2 wheels) Gait Pattern/deviations: Step-through pattern, Decreased step length - right, Decreased step length - left, Decreased stride length, Shuffle Gait velocity: decr     General Gait Details:  (Pt without c/o dizziness however was hitting the walls with right front of walker)   Stairs             Wheelchair Mobility    Modified Rankin (Stroke Patients Only)       Balance Overall balance assessment: Modified Independent Sitting-balance support: Feet supported Sitting balance-Leahy Scale: Good     Standing balance support: Bilateral upper extremity supported, During functional activity, Reliant on assistive device for balance Standing balance-Leahy Scale: Fair                              Cognition Arousal/Alertness: Lethargic, Suspect due to medications Behavior During Therapy: Flat affect Overall Cognitive Status: Within Functional Limits for tasks assessed                                 General Comments: very sleepy        Exercises      General Comments General comments (skin integrity, edema, etc.): Discussed concerns with pt and pt's sister regarding d/c'ing home today since pt appears overly medicated, low BP, and high fall risk      Pertinent Vitals/Pain Pain  Assessment Pain Assessment: 0-10 Pain Score: 3  Pain Location: back Pain Descriptors / Indicators: Grimacing Pain Intervention(s): Monitored during session    Home Living                          Prior Function            PT Goals (current goals can now be found in the care plan section) Acute Rehab PT Goals Patient Stated Goal: to return home    Frequency    BID      PT Plan Current  plan remains appropriate    Co-evaluation              AM-PAC PT "6 Clicks" Mobility   Outcome Measure  Help needed turning from your back to your side while in a flat bed without using bedrails?: A Little Help needed moving from lying on your back to sitting on the side of a flat bed without using bedrails?: A Little Help needed moving to and from a bed to a chair (including a wheelchair)?: A Little Help needed standing up from a chair using your arms (e.g., wheelchair or bedside chair)?: A Little Help needed to walk in hospital room?: A Little Help needed climbing 3-5 steps with a railing? : A Little 6 Click Score: 18    End of Session Equipment Utilized During Treatment: Gait belt Activity Tolerance: Patient tolerated treatment well Patient left: in bed;with call bell/phone within reach;with family/visitor present Nurse Communication: Mobility status PT Visit Diagnosis: Pain;Difficulty in walking, not elsewhere classified (R26.2);Other abnormalities of gait and mobility (R26.89)     Time: 1315-1340 PT Time Calculation (min) (ACUTE ONLY): 25 min  Charges:  $Gait Training: 8-22 mins $Therapeutic Activity: 8-22 mins                    Mikel Cella, PTA   Josie Dixon 01/28/2022, 2:16 PM

## 2022-01-28 NOTE — Care Management Important Message (Signed)
Important Message  Patient Details  Name: Janet [name] MRN: [id number] Date of Birth: 04-13-1950   Medicare Important Message Given:  N/A - LOS <3 / Initial given by admissions     Johnell Comings 01/28/2022, 11:15 AM

## 2022-01-28 NOTE — Progress Notes (Signed)
PT Cancellation Note  Patient Details Name: Janet [name] MRN: [id number] DOB: 1950/05/20   Cancelled Treatment:     Therapist in to see pt who just received pain meds, however pt unable to stay alert for conversation and states she is in too much pain. Will return to re-attempt session.    [name] 01/28/2022, 10:40 AM

## 2022-01-28 NOTE — Progress Notes (Signed)
Patient has DME at home She is set up with [facility] for [facility] thru the surgeons office

## 2022-01-28 NOTE — Plan of Care (Signed)

## 2022-01-28 NOTE — Progress Notes (Signed)
Orthopedic Tech Progress Note Patient Details:  Janet [name] 1950/08/11 [id number]   Called in [facility] to [facility]   Patient ID: Janet [name], female   DOB: April 01, 1950, 71 y.o.   MRN: [id number]  Janet [name] 01/28/2022, 10:18 AM

## 2022-01-29 NOTE — Plan of Care (Signed)

## 2022-01-29 NOTE — Plan of Care (Signed)

## 2022-01-29 NOTE — Plan of Care (Signed)
Problem: Education: Goal: Ability to verbalize activity precautions or restrictions will improve 01/29/2022 1113 by Orma Render, RN Outcome: Adequate for Discharge 01/29/2022 [phone] by Orma Render, RN Outcome: Progressing Goal: Knowledge of the prescribed therapeutic regimen will improve 01/29/2022 1113 by Orma Render, RN Outcome: Adequate for Discharge 01/29/2022 0917 by Orma Render, RN Outcome: Progressing Goal: Understanding of discharge needs will improve 01/29/2022 1113 by Orma Render, RN Outcome: Adequate for Discharge 01/29/2022 0917 by Orma Render, RN Outcome: Progressing   Problem: Activity: Goal: Ability to avoid complications of mobility impairment will improve 01/29/2022 1113 by Orma Render, RN Outcome: Adequate for Discharge 01/29/2022 [phone] by Orma Render, RN Outcome: Progressing Goal: Ability to tolerate increased activity will improve 01/29/2022 1113 by Orma Render, RN Outcome: Adequate for Discharge 01/29/2022 0917 by Orma Render, RN Outcome: Progressing Goal: Will remain free from falls 01/29/2022 1113 by Orma Render, RN Outcome: Adequate for Discharge 01/29/2022 0917 by Orma Render, RN Outcome: Progressing   Problem: Bowel/Gastric: Goal: Gastrointestinal status for postoperative course will improve 01/29/2022 1113 by Orma Render, RN Outcome: Adequate for Discharge 01/29/2022 [phone] by Orma Render, RN Outcome: Progressing   Problem: Clinical Measurements: Goal: Ability to maintain clinical measurements within normal limits will improve 01/29/2022 1113 by Orma Render, RN Outcome: Adequate for Discharge 01/29/2022 0917 by Orma Render, RN Outcome: Progressing Goal: Postoperative complications will be avoided or minimized 01/29/2022 1113 by Orma Render, RN Outcome: Adequate for Discharge 01/29/2022 0917 by Orma Render, RN Outcome: Progressing Goal: Diagnostic test results will improve 01/29/2022 1113 by Orma Render, RN Outcome:  Adequate for Discharge 01/29/2022 0917 by Orma Render, RN Outcome: Progressing   Problem: Pain Management: Goal: Pain level will decrease 01/29/2022 1113 by Orma Render, RN Outcome: Adequate for Discharge 01/29/2022 0917 by Orma Render, RN Outcome: Progressing   Problem: Skin Integrity: Goal: Will show signs of wound healing 01/29/2022 1113 by Orma Render, RN Outcome: Adequate for Discharge 01/29/2022 0917 by Orma Render, RN Outcome: Progressing   Problem: Health Behavior/Discharge Planning: Goal: Identification of resources available to assist in meeting health care needs will improve 01/29/2022 1113 by Bobbye Reinitz, Sharlett Iles, RN Outcome: Adequate for Discharge 01/29/2022 0917 by Orma Render, RN Outcome: Progressing   Problem: Bladder/Genitourinary: Goal: Urinary functional status for postoperative course will improve 01/29/2022 1113 by Orma Render, RN Outcome: Adequate for Discharge 01/29/2022 0917 by Orma Render, RN Outcome: Progressing   Problem: Education: Goal: Knowledge of General Education information will improve Description: Including pain rating scale, medication(s)/side effects and non-pharmacologic comfort measures 01/29/2022 1113 by Orma Render, RN Outcome: Adequate for Discharge 01/29/2022 0917 by Orma Render, RN Outcome: Progressing   Problem: Health Behavior/Discharge Planning: Goal: Ability to manage health-related needs will improve 01/29/2022 1113 by Orma Render, RN Outcome: Adequate for Discharge 01/29/2022 0917 by Orma Render, RN Outcome: Progressing   Problem: Clinical Measurements: Goal: Ability to maintain clinical measurements within normal limits will improve 01/29/2022 1113 by Orma Render, RN Outcome: Adequate for Discharge 01/29/2022 1194 by Orma Render, RN Outcome: Progressing Goal: Will remain free from infection 01/29/2022 1113 by Orma Render, RN Outcome: Adequate for Discharge 01/29/2022 [phone] by Orma Render, RN Outcome:  Progressing Goal: Diagnostic test results will improve 01/29/2022 1113 by Orma Render, RN Outcome: Adequate for Discharge 01/29/[phone] by Orma Render, RN Outcome: Progressing  Goal: Respiratory complications will improve 01/29/2022 1113 by Orma Render, RN Outcome: Adequate for Discharge 01/29/2022 [phone] by Orma Render, RN Outcome: Progressing Goal: Cardiovascular complication will be avoided 01/29/2022 1113 by Orma Render, RN Outcome: Adequate for Discharge 01/29/2022 0917 by Orma Render, RN Outcome: Progressing   Problem: Activity: Goal: Risk for activity intolerance will decrease 01/29/2022 1113 by Orma Render, RN Outcome: Adequate for Discharge 01/29/2022 [phone] by Orma Render, RN Outcome: Progressing   Problem: Nutrition: Goal: Adequate nutrition will be maintained 01/29/2022 1113 by Orma Render, RN Outcome: Adequate for Discharge 01/29/2022 0917 by Orma Render, RN Outcome: Progressing   Problem: Coping: Goal: Level of anxiety will decrease 01/29/2022 1113 by Orma Render, RN Outcome: Adequate for Discharge 01/29/2022 0917 by Orma Render, RN Outcome: Progressing   Problem: Elimination: Goal: Will not experience complications related to bowel motility 01/29/2022 1113 by Orma Render, RN Outcome: Adequate for Discharge 01/29/2022 0917 by Orma Render, RN Outcome: Progressing Goal: Will not experience complications related to urinary retention 01/29/2022 1113 by Orma Render, RN Outcome: Adequate for Discharge 01/29/2022 0917 by Orma Render, RN Outcome: Progressing   Problem: Pain Managment: Goal: General experience of comfort will improve 01/29/2022 1113 by Orma Render, RN Outcome: Adequate for Discharge 01/29/2022 0917 by Orma Render, RN Outcome: Progressing   Problem: Safety: Goal: Ability to remain free from injury will improve 01/29/2022 1113 by Orma Render, RN Outcome: Adequate for Discharge 01/29/2022 0917 by Orma Render,  RN Outcome: Progressing   Problem: Skin Integrity: Goal: Risk for impaired skin integrity will decrease 01/29/2022 1113 by Orma Render, RN Outcome: Adequate for Discharge 01/29/2022 0917 by Orma Render, RN Outcome: Progressing

## 2022-01-29 NOTE — Discharge Summary (Signed)
History: Janet [name] is s/p L3-5 XLIF and PSF for spondylolisthesis and radiculopathy.  POD3:  doing much better.  Has some right thigh numbness and pain, but full strength.  Eager to go home.    POD2: Improved pain this morning. States her pain was well controlled on PO medications overnight and is eager to d/c home this morning. Her pre-op leg pain is much improved!   POD1: Pt had increased back pain overnight and swelling around left sided posterior incision. This morning she reports improved pain but admits to chest tightness. She reports experiencing this with her previous surgery.   Physical Exam:     Vitals:    01/28/22 0400 01/28/22 0700  BP: 124/66 129/64  Pulse: 93 99  Resp: 18 17  Temp: 97.9 F (36.6 C) 98.1 F (36.7 C)  SpO2: 93% 97%      Drowsy but arouses easily Ox3 CNI   Strength:5/5 throughout BLE Incisions covered with mildly blood tinged dressings. No obvious raised hematoma.    Data:   Other tests/results: none    Assessment/Plan:   Janet [name] is a 71 y.o s/p L3-5 XLIF and PSF with improved leg pain thus far,   -DC home today

## 2022-01-29 NOTE — Progress Notes (Signed)
Physical Therapy Treatment Patient Details Name: Janet [name] MRN: [id number] DOB: [birth year] Today's Date: 01/29/2022   History of Present Illness s/p L3-5 XLIF and PSF for spondylolisthesis and radiculopathy. S/P ACDF C4-7 11/03/21.    PT Comments    Pt was more alert, oriented, and ready to do PT this morning.  Pt is progressing with mobility demonstrating supervision with bed mobility, transfers, and gait ( 120' with RW).  Current PT d/c recommendations are appropriate.   Recommendations for follow up therapy are one component of a multi-disciplinary discharge planning process, led by the attending physician.  Recommendations may be updated based on patient status, additional functional criteria and insurance authorization.  Follow Up Recommendations  Home health PT     Assistance Recommended at Discharge Intermittent Supervision/Assistance  Patient can return home with the following A little help with walking and/or transfers;A little help with bathing/dressing/bathroom;Assist for transportation;Help with stairs or ramp for entrance;Assistance with cooking/housework   Equipment Recommendations  None recommended by PT    Recommendations for Other Services       Precautions / Restrictions Precautions Precautions: Fall;Back Precaution Booklet Issued: Yes (comment) Restrictions Weight Bearing Restrictions: No     Mobility  Bed Mobility Overal bed mobility: Needs Assistance Bed Mobility: Sidelying to Sit, Supine to Sit   Sidelying to sit: Supervision Supine to sit: Supervision Sit to supine: Supervision   General bed mobility comments: Increased time to complete, used bedrails, has hospital bed at home.    Transfers Overall transfer level: Modified independent Equipment used: Rolling walker (2 wheels) Transfers: Sit to/from Stand Sit to Stand: Modified independent (Device/Increase time)           General transfer comment: Pt remembered safe hand placement.     Ambulation/Gait Ambulation/Gait assistance: Supervision Gait Distance (Feet): 120 Feet Assistive device: Rolling walker (2 wheels) Gait Pattern/deviations: Step-through pattern, Decreased step length - right, Decreased step length - left, Decreased stride length Gait velocity: decreased         Stairs             Wheelchair Mobility    Modified Rankin (Stroke Patients Only)       Balance Overall balance assessment: Modified Independent Sitting-balance support: Feet supported Sitting balance-Leahy Scale: Good     Standing balance support: During functional activity, Reliant on assistive device for balance, Single extremity supported Standing balance-Leahy Scale: Good Standing balance comment: able to static stand without UE support                            Cognition Arousal/Alertness: Lethargic, Suspect due to medications Behavior During Therapy: WFL for tasks assessed/performed Overall Cognitive Status: Within Functional Limits for tasks assessed                                 General Comments: Not as sleepy today.        Exercises      General Comments        Pertinent Vitals/Pain Pain Assessment Pain Assessment: 0-10 Pain Score: 7  (pain when going from sit<>stands) Pain Location: back Pain Descriptors / Indicators: Grimacing    Home Living                          Prior Function            PT Goals (current goals  can now be found in the care plan section) Acute Rehab PT Goals Patient Stated Goal: to return home Time For Goal Achievement: 02/03/22 Potential to Achieve Goals: Good Progress towards PT goals: Progressing toward goals    Frequency    BID      PT Plan Current plan remains appropriate    Co-evaluation              AM-PAC PT "6 Clicks" Mobility   Outcome Measure  Help needed turning from your back to your side while in a flat bed without using bedrails?: A Little Help  needed moving from lying on your back to sitting on the side of a flat bed without using bedrails?: A Little Help needed moving to and from a bed to a chair (including a wheelchair)?: A Little Help needed standing up from a chair using your arms (e.g., wheelchair or bedside chair)?: None Help needed to walk in hospital room?: A Little Help needed climbing 3-5 steps with a railing? : A Little 6 Click Score: 19    End of Session Equipment Utilized During Treatment: Gait belt Activity Tolerance: Patient tolerated treatment well Patient left: with call bell/phone within reach;with family/visitor present;in chair;with chair alarm set Nurse Communication: Mobility status PT Visit Diagnosis: Pain;Difficulty in walking, not elsewhere classified (R26.2);Other abnormalities of gait and mobility (R26.89)     Time: 7341-9379 PT Time Calculation (min) (ACUTE ONLY): 15 min  Charges:  $Therapeutic Activity: 8-22 mins                     Hortencia Conradi, PTA  01/29/22, 10:07 AM

## 2022-01-31 ENCOUNTER — Telehealth: Payer: Self-pay | Admitting: *Deleted

## 2022-01-31 DIAGNOSIS — E119 Type 2 diabetes mellitus without complications: Secondary | ICD-10-CM | POA: Diagnosis not present

## 2022-01-31 DIAGNOSIS — I739 Peripheral vascular disease, unspecified: Secondary | ICD-10-CM | POA: Diagnosis not present

## 2022-01-31 DIAGNOSIS — Z4789 Encounter for other orthopedic aftercare: Secondary | ICD-10-CM | POA: Diagnosis not present

## 2022-01-31 DIAGNOSIS — M1990 Unspecified osteoarthritis, unspecified site: Secondary | ICD-10-CM | POA: Diagnosis not present

## 2022-01-31 DIAGNOSIS — M5441 Lumbago with sciatica, right side: Secondary | ICD-10-CM | POA: Diagnosis not present

## 2022-01-31 DIAGNOSIS — M5442 Lumbago with sciatica, left side: Secondary | ICD-10-CM | POA: Diagnosis not present

## 2022-01-31 DIAGNOSIS — Z981 Arthrodesis status: Secondary | ICD-10-CM | POA: Diagnosis not present

## 2022-01-31 DIAGNOSIS — I119 Hypertensive heart disease without heart failure: Secondary | ICD-10-CM | POA: Diagnosis not present

## 2022-01-31 DIAGNOSIS — I519 Heart disease, unspecified: Secondary | ICD-10-CM | POA: Diagnosis not present

## 2022-01-31 DIAGNOSIS — G8929 Other chronic pain: Secondary | ICD-10-CM | POA: Diagnosis not present

## 2022-01-31 NOTE — Patient Outreach (Signed)
  [facility] Note Transition Care Management Follow-up Telephone Call Date of discharge and from where: [facility] [phone] How have you been since you were released from the hospital? I am doing really good Any questions or concerns? No  Items Reviewed: Did the pt receive and understand the discharge instructions provided? Yes  Medications obtained and verified? Yes  Other? No  Any new allergies since your discharge? Yes  Dietary orders reviewed? No Do you have support at home? Yes   Home Care and Equipment/Supplies: Were home health services ordered? no If so, what is the name of the agency? N/a  Has the agency set up a time to come to the patient's home? not applicable Were any new equipment or medical supplies ordered?  No What is the name of the medical supply agency? n Were you able to get the supplies/equipment? not applicable Do you have any questions related to the use of the equipment or supplies? No  Functional Questionnaire: (I = Independent and D = Dependent) ADLs: I  Bathing/Dressing- I  Meal Prep- I made patient aware of the [employer] meal plan  Eating- I  Maintaining continence- I  Transferring/Ambulation- I  Managing Meds- I  Follow up appointments reviewed:  PCP Hospital f/u appt confirmed? [facility] f/u appt confirmed? Yes  Dr Manning Charity 12/152023 10:30 Are transportation arrangements needed? No  Patient made aware of [employer] transportation and will call if needed. She is making arrangements for her Dr visit If their condition worsens, is the pt aware to call PCP or go to the Emergency Dept.? Yes Was the patient provided with contact information for the PCP's office or ED? Yes Was to pt encouraged to call back with questions or concerns? Yes  SDOH assessments and interventions completed:   Yes  SDOH Screenings   Food Insecurity: No Food Insecurity (01/31/2022)  Housing: Low Risk  (01/31/2022)  Recent Concern: Housing - Medium Risk  (11/03/2021)  Transportation Needs: No Transportation Needs (01/31/2022)  Utilities: Not At Risk (01/26/2022)  Alcohol Screen: Low Risk  (11/08/2021)  Depression (PHQ2-9): Low Risk  (09/10/2021)  Financial Resource Strain: Medium Risk (11/08/2021)  Physical Activity: Inactive (11/08/2021)  Social Connections: Socially Isolated (11/08/2021)  Stress: No Stress Concern Present (11/08/2021)  Tobacco Use: Medium Risk (01/28/2022)     Care Coordination Interventions:  No Care Coordination interventions needed at this time. Patient made aware of the [facility] meal plan and the transportation service    Encounter Outcome:  Pt. Visit Completed    Gean Maidens BSN RN [facility] [phone]

## 2022-02-02 DIAGNOSIS — I519 Heart disease, unspecified: Secondary | ICD-10-CM | POA: Diagnosis not present

## 2022-02-02 DIAGNOSIS — Z981 Arthrodesis status: Secondary | ICD-10-CM | POA: Diagnosis not present

## 2022-02-02 DIAGNOSIS — Z4789 Encounter for other orthopedic aftercare: Secondary | ICD-10-CM | POA: Diagnosis not present

## 2022-02-02 DIAGNOSIS — I739 Peripheral vascular disease, unspecified: Secondary | ICD-10-CM | POA: Diagnosis not present

## 2022-02-02 DIAGNOSIS — M5441 Lumbago with sciatica, right side: Secondary | ICD-10-CM | POA: Diagnosis not present

## 2022-02-02 DIAGNOSIS — E119 Type 2 diabetes mellitus without complications: Secondary | ICD-10-CM | POA: Diagnosis not present

## 2022-02-02 DIAGNOSIS — I119 Hypertensive heart disease without heart failure: Secondary | ICD-10-CM | POA: Diagnosis not present

## 2022-02-02 DIAGNOSIS — M5442 Lumbago with sciatica, left side: Secondary | ICD-10-CM | POA: Diagnosis not present

## 2022-02-02 DIAGNOSIS — G8929 Other chronic pain: Secondary | ICD-10-CM | POA: Diagnosis not present

## 2022-02-02 DIAGNOSIS — M1990 Unspecified osteoarthritis, unspecified site: Secondary | ICD-10-CM | POA: Diagnosis not present

## 2022-02-04 ENCOUNTER — Other Ambulatory Visit: Payer: Self-pay

## 2022-02-04 ENCOUNTER — Encounter: Payer: Self-pay | Admitting: Neurosurgery

## 2022-02-04 ENCOUNTER — Other Ambulatory Visit: Payer: Medicare Other

## 2022-02-04 DIAGNOSIS — G8929 Other chronic pain: Secondary | ICD-10-CM | POA: Diagnosis not present

## 2022-02-04 DIAGNOSIS — I519 Heart disease, unspecified: Secondary | ICD-10-CM | POA: Diagnosis not present

## 2022-02-04 DIAGNOSIS — I739 Peripheral vascular disease, unspecified: Secondary | ICD-10-CM | POA: Diagnosis not present

## 2022-02-04 DIAGNOSIS — M1990 Unspecified osteoarthritis, unspecified site: Secondary | ICD-10-CM | POA: Diagnosis not present

## 2022-02-04 DIAGNOSIS — Z981 Arthrodesis status: Secondary | ICD-10-CM | POA: Diagnosis not present

## 2022-02-04 DIAGNOSIS — M5441 Lumbago with sciatica, right side: Secondary | ICD-10-CM | POA: Diagnosis not present

## 2022-02-04 DIAGNOSIS — I119 Hypertensive heart disease without heart failure: Secondary | ICD-10-CM | POA: Diagnosis not present

## 2022-02-04 DIAGNOSIS — E119 Type 2 diabetes mellitus without complications: Secondary | ICD-10-CM | POA: Diagnosis not present

## 2022-02-04 DIAGNOSIS — Z4789 Encounter for other orthopedic aftercare: Secondary | ICD-10-CM | POA: Diagnosis not present

## 2022-02-04 DIAGNOSIS — M5442 Lumbago with sciatica, left side: Secondary | ICD-10-CM | POA: Diagnosis not present

## 2022-02-07 DIAGNOSIS — M5441 Lumbago with sciatica, right side: Secondary | ICD-10-CM | POA: Diagnosis not present

## 2022-02-07 DIAGNOSIS — I519 Heart disease, unspecified: Secondary | ICD-10-CM | POA: Diagnosis not present

## 2022-02-07 DIAGNOSIS — M1990 Unspecified osteoarthritis, unspecified site: Secondary | ICD-10-CM | POA: Diagnosis not present

## 2022-02-07 DIAGNOSIS — I119 Hypertensive heart disease without heart failure: Secondary | ICD-10-CM | POA: Diagnosis not present

## 2022-02-07 DIAGNOSIS — E119 Type 2 diabetes mellitus without complications: Secondary | ICD-10-CM | POA: Diagnosis not present

## 2022-02-07 DIAGNOSIS — Z4789 Encounter for other orthopedic aftercare: Secondary | ICD-10-CM | POA: Diagnosis not present

## 2022-02-07 DIAGNOSIS — G8929 Other chronic pain: Secondary | ICD-10-CM | POA: Diagnosis not present

## 2022-02-07 DIAGNOSIS — M5442 Lumbago with sciatica, left side: Secondary | ICD-10-CM | POA: Diagnosis not present

## 2022-02-07 DIAGNOSIS — Z981 Arthrodesis status: Secondary | ICD-10-CM | POA: Diagnosis not present

## 2022-02-07 DIAGNOSIS — I739 Peripheral vascular disease, unspecified: Secondary | ICD-10-CM | POA: Diagnosis not present

## 2022-02-07 NOTE — Progress Notes (Unsigned)
   REFERRING PHYSICIAN:  No referring provider defined for this encounter.  DOS: 01/26/22 XLIF/PSF L3-L5  HISTORY OF PRESENT ILLNESS: Janet [name] is 2 weeks status post XLIF/PSF L3-L5. Given oxycodone, robaxin,  on discharge from the hospital.      PHYSICAL EXAMINATION:  NEUROLOGICAL:  General: In no acute distress.   Awake, alert, oriented to person, place, and time.  Pupils equal round and reactive to light.  Facial tone is symmetric.    Strength: Side Biceps Triceps Deltoid Interossei Grip Wrist Ext. Wrist Flex.  R 5 5 5 5 5 5 5   L 5 5 5 5 5 5 5    Incision c/d/i  Imaging:  Nothing new to review.   Assessment / Plan: Janet [name] is doing well s/p above surgery. Treatment options reviewed with patient and following plan made:   - We discussed activity escalation and I have advised the patient to lift up to 10 pounds until 6 weeks after surgery (until your follow up with Dr. ).   - Reviewed wound care.  - Continue current medications including *** - Follow up as scheduled in 4 weeks and prn.   Advised to contact the office if any questions or concerns arise.   [name] PA-C Dept of Neurosurgery

## 2022-02-08 ENCOUNTER — Encounter: Payer: Self-pay | Admitting: Orthopedic Surgery

## 2022-02-08 ENCOUNTER — Ambulatory Visit (INDEPENDENT_AMBULATORY_CARE_PROVIDER_SITE_OTHER): Payer: Medicare Other | Admitting: Orthopedic Surgery

## 2022-02-08 VITALS — BP 106/60 | Temp 98.6°F | Wt 210.4 lb

## 2022-02-08 DIAGNOSIS — M5441 Lumbago with sciatica, right side: Secondary | ICD-10-CM

## 2022-02-08 DIAGNOSIS — Z981 Arthrodesis status: Secondary | ICD-10-CM

## 2022-02-08 DIAGNOSIS — Z09 Encounter for follow-up examination after completed treatment for conditions other than malignant neoplasm: Secondary | ICD-10-CM

## 2022-02-08 DIAGNOSIS — M5442 Lumbago with sciatica, left side: Secondary | ICD-10-CM

## 2022-02-08 DIAGNOSIS — M4316 Spondylolisthesis, lumbar region: Secondary | ICD-10-CM

## 2022-02-10 ENCOUNTER — Encounter: Payer: Medicare Other | Admitting: Neurosurgery

## 2022-02-11 ENCOUNTER — Encounter: Payer: Medicare Other | Admitting: Neurosurgery

## 2022-02-11 DIAGNOSIS — M5441 Lumbago with sciatica, right side: Secondary | ICD-10-CM | POA: Diagnosis not present

## 2022-02-11 DIAGNOSIS — E119 Type 2 diabetes mellitus without complications: Secondary | ICD-10-CM | POA: Diagnosis not present

## 2022-02-11 DIAGNOSIS — I119 Hypertensive heart disease without heart failure: Secondary | ICD-10-CM | POA: Diagnosis not present

## 2022-02-11 DIAGNOSIS — Z4789 Encounter for other orthopedic aftercare: Secondary | ICD-10-CM | POA: Diagnosis not present

## 2022-02-11 DIAGNOSIS — M1990 Unspecified osteoarthritis, unspecified site: Secondary | ICD-10-CM | POA: Diagnosis not present

## 2022-02-11 DIAGNOSIS — Z981 Arthrodesis status: Secondary | ICD-10-CM | POA: Diagnosis not present

## 2022-02-11 DIAGNOSIS — I519 Heart disease, unspecified: Secondary | ICD-10-CM | POA: Diagnosis not present

## 2022-02-11 DIAGNOSIS — I739 Peripheral vascular disease, unspecified: Secondary | ICD-10-CM | POA: Diagnosis not present

## 2022-02-11 DIAGNOSIS — G8929 Other chronic pain: Secondary | ICD-10-CM | POA: Diagnosis not present

## 2022-02-11 DIAGNOSIS — M5442 Lumbago with sciatica, left side: Secondary | ICD-10-CM | POA: Diagnosis not present

## 2022-02-15 ENCOUNTER — Encounter: Payer: Medicare Other | Admitting: Neurosurgery

## 2022-02-17 DIAGNOSIS — I519 Heart disease, unspecified: Secondary | ICD-10-CM | POA: Diagnosis not present

## 2022-02-17 DIAGNOSIS — I119 Hypertensive heart disease without heart failure: Secondary | ICD-10-CM | POA: Diagnosis not present

## 2022-02-17 DIAGNOSIS — I739 Peripheral vascular disease, unspecified: Secondary | ICD-10-CM | POA: Diagnosis not present

## 2022-02-17 DIAGNOSIS — Z4789 Encounter for other orthopedic aftercare: Secondary | ICD-10-CM | POA: Diagnosis not present

## 2022-02-17 DIAGNOSIS — M5441 Lumbago with sciatica, right side: Secondary | ICD-10-CM | POA: Diagnosis not present

## 2022-02-17 DIAGNOSIS — M1990 Unspecified osteoarthritis, unspecified site: Secondary | ICD-10-CM | POA: Diagnosis not present

## 2022-02-17 DIAGNOSIS — G8929 Other chronic pain: Secondary | ICD-10-CM | POA: Diagnosis not present

## 2022-02-17 DIAGNOSIS — E119 Type 2 diabetes mellitus without complications: Secondary | ICD-10-CM | POA: Diagnosis not present

## 2022-02-17 DIAGNOSIS — M5442 Lumbago with sciatica, left side: Secondary | ICD-10-CM | POA: Diagnosis not present

## 2022-02-17 DIAGNOSIS — Z981 Arthrodesis status: Secondary | ICD-10-CM | POA: Diagnosis not present

## 2022-02-23 ENCOUNTER — Other Ambulatory Visit: Payer: Self-pay | Admitting: Neurosurgery

## 2022-03-01 ENCOUNTER — Encounter: Payer: Medicare Other | Admitting: Orthopedic Surgery

## 2022-03-08 ENCOUNTER — Other Ambulatory Visit: Payer: Self-pay | Admitting: Family Medicine

## 2022-03-08 DIAGNOSIS — I10 Essential (primary) hypertension: Secondary | ICD-10-CM

## 2022-03-08 NOTE — Telephone Encounter (Signed)
Last RF 06/08/21 #90 3 RF  Requested Prescriptions  Refused Prescriptions Disp Refills   losartan-hydrochlorothiazide (HYZAAR) 100-25 MG tablet [Pharmacy Med Name: LOSARTAN/HCTZ 100/25MG  TABLETS] 90 tablet 3    Sig: TAKE 1 TABLET BY MOUTH DAILY     Cardiovascular: ARB + Diuretic Combos Passed - 03/08/2022 12:11 PM      Passed - K in normal range and within 180 days    Potassium  Date Value Ref Range Status  12/24/2021 3.6 3.5 - 5.1 mmol/L Final         Passed - Na in normal range and within 180 days    Sodium  Date Value Ref Range Status  12/24/2021 138 135 - 145 mmol/L Final         Passed - Cr in normal range and within 180 days    Creat  Date Value Ref Range Status  09/07/2021 0.67 0.50 - 1.05 mg/dL Final   Creatinine, Ser  Date Value Ref Range Status  12/24/2021 0.58 0.44 - 1.00 mg/dL Final   Creatinine, Urine  Date Value Ref Range Status  09/10/2021 32 20 - 275 mg/dL Final         Passed - eGFR is 10 or above and within 180 days    GFR, Est African American  Date Value Ref Range Status  09/12/2019 107 > OR = 60 mL/min/1.65m2 Final   GFR, Est Non African American  Date Value Ref Range Status  09/12/2019 93 > OR = 60 mL/min/1.52m2 Final   GFR, Estimated  Date Value Ref Range Status  12/24/2021 >60 >60 mL/min Final    Comment:    (NOTE) Calculated using the CKD-EPI Creatinine Equation (2021)    eGFR  Date Value Ref Range Status  09/07/2021 95 > OR = 60 mL/min/1.35m2 Final    Comment:    The eGFR is based on the CKD-EPI 2021 equation. To calculate  the new eGFR from a previous Creatinine or Cystatin C result, go to https://www.kidney.org/professionals/ kdoqi/gfr%5Fcalculator          Passed - Patient is not pregnant      Passed - Last BP in normal range    BP Readings from Last 1 Encounters:  02/08/22 106/60         Passed - Valid encounter within last 6 months    Recent Outpatient Visits           5 months ago Annual physical exam   [place], DO   1 year ago Venous stasis dermatitis of left lower extremity   [place], DO   1 year ago Annual physical exam   [place], DO   1 year ago Restless leg syndrome   [facility] [place], Devonne Doughty, DO   2 years ago Type 2 diabetes mellitus with other specified complication, without long-term current use of insulin [facility])   [place], Devonne Doughty, DO

## 2022-03-15 ENCOUNTER — Other Ambulatory Visit: Payer: Self-pay

## 2022-03-15 ENCOUNTER — Ambulatory Visit (INDEPENDENT_AMBULATORY_CARE_PROVIDER_SITE_OTHER): Payer: 59 | Admitting: Neurosurgery

## 2022-03-15 ENCOUNTER — Encounter: Payer: Self-pay | Admitting: Neurosurgery

## 2022-03-15 ENCOUNTER — Ambulatory Visit
Admission: RE | Admit: 2022-03-15 | Discharge: 2022-03-15 | Disposition: A | Discharge status: Home / Self Care | Payer: 59 | Source: Ambulatory Visit | Attending: Neurosurgery | Admitting: Neurosurgery

## 2022-03-15 ENCOUNTER — Ambulatory Visit
Admission: RE | Admit: 2022-03-15 | Discharge: 2022-03-15 | Disposition: A | Discharge status: Home / Self Care | Payer: 59 | Attending: Neurosurgery | Admitting: Neurosurgery

## 2022-03-15 VITALS — BP 136/78 | Temp 97.8°F | Ht 62.0 in | Wt 210.0 lb

## 2022-03-15 DIAGNOSIS — M4316 Spondylolisthesis, lumbar region: Secondary | ICD-10-CM

## 2022-03-15 DIAGNOSIS — M47896 Other spondylosis, lumbar region: Secondary | ICD-10-CM | POA: Insufficient documentation

## 2022-03-15 DIAGNOSIS — Z981 Arthrodesis status: Secondary | ICD-10-CM

## 2022-03-15 DIAGNOSIS — M4312 Spondylolisthesis, cervical region: Secondary | ICD-10-CM | POA: Diagnosis not present

## 2022-03-15 DIAGNOSIS — M47812 Spondylosis without myelopathy or radiculopathy, cervical region: Secondary | ICD-10-CM | POA: Diagnosis not present

## 2022-03-15 DIAGNOSIS — M5441 Lumbago with sciatica, right side: Secondary | ICD-10-CM

## 2022-03-15 DIAGNOSIS — M47816 Spondylosis without myelopathy or radiculopathy, lumbar region: Secondary | ICD-10-CM | POA: Diagnosis not present

## 2022-03-15 DIAGNOSIS — M5442 Lumbago with sciatica, left side: Secondary | ICD-10-CM

## 2022-03-15 DIAGNOSIS — Z09 Encounter for follow-up examination after completed treatment for conditions other than malignant neoplasm: Secondary | ICD-10-CM

## 2022-03-15 NOTE — Progress Notes (Signed)
   REFERRING PHYSICIAN:  Andee Poles [address] [place],  [place] [address]  DOS: 01/26/22 XLIF/PSF L3-L5  HISTORY OF PRESENT ILLNESS: Janet [name] is status post XLIF/PSF L3-L5.  She is doing well.  She has minimal back and leg pain.      PHYSICAL EXAMINATION:  NEUROLOGICAL:  General: In no acute distress.   Awake, alert, oriented to person, place, and time.  Pupils equal round and reactive to light.  Facial tone is symmetric.    Strength: Side Biceps Triceps Deltoid Interossei Grip Wrist Ext. Wrist Flex.  R 5 5 5 5 5 5 5   L 5 5 5 5 5 5 5    Incisions c/d/i  Imaging:  No complications noted on cervical and lumbar spine x-rays  Assessment / Plan: Janet [name] is doing well s/p above surgery.  I am very pleased with her improvement in symptoms.  I will see her back in 6 weeks with x-rays.  I have released her to travel.  We discussed increasing her lifting limit to 25 pounds.    Meade Maw MD Dept of Neurosurgery

## 2022-03-17 ENCOUNTER — Other Ambulatory Visit: Payer: Self-pay | Admitting: Family Medicine

## 2022-03-17 ENCOUNTER — Encounter: Payer: Self-pay | Admitting: Family Medicine

## 2022-03-17 ENCOUNTER — Ambulatory Visit (INDEPENDENT_AMBULATORY_CARE_PROVIDER_SITE_OTHER): Payer: 59 | Admitting: Family Medicine

## 2022-03-17 VITALS — BP 154/72 | HR 78 | Ht 62.0 in | Wt 218.0 lb

## 2022-03-17 DIAGNOSIS — I872 Venous insufficiency (chronic) (peripheral): Secondary | ICD-10-CM | POA: Diagnosis not present

## 2022-03-17 DIAGNOSIS — I890 Lymphedema, not elsewhere classified: Secondary | ICD-10-CM | POA: Diagnosis not present

## 2022-03-17 DIAGNOSIS — E8770 Fluid overload, unspecified: Secondary | ICD-10-CM | POA: Diagnosis not present

## 2022-03-17 DIAGNOSIS — I5189 Other ill-defined heart diseases: Secondary | ICD-10-CM | POA: Diagnosis not present

## 2022-03-17 DIAGNOSIS — G2581 Restless legs syndrome: Secondary | ICD-10-CM

## 2022-03-17 DIAGNOSIS — I10 Essential (primary) hypertension: Secondary | ICD-10-CM

## 2022-03-17 DIAGNOSIS — G4709 Other insomnia: Secondary | ICD-10-CM

## 2022-03-17 DIAGNOSIS — F331 Major depressive disorder, recurrent, moderate: Secondary | ICD-10-CM

## 2022-03-17 DIAGNOSIS — E1169 Type 2 diabetes mellitus with other specified complication: Secondary | ICD-10-CM

## 2022-03-17 MED ORDER — ATORVASTATIN CALCIUM 40 MG PO TABS: 40.0000 mg | ORAL_TABLET | Freq: Every day | ORAL | 3 refills | Status: DC

## 2022-03-17 MED ORDER — FUROSEMIDE 40 MG PO TABS: 40.0000 mg | ORAL_TABLET | Freq: Every day | ORAL | 2 refills | Status: DC | PRN

## 2022-03-17 MED ORDER — MUPIROCIN 2 % EX OINT: 1.0000 | TOPICAL_OINTMENT | Freq: Two times a day (BID) | CUTANEOUS | 1 refills | Status: DC | PRN

## 2022-03-17 MED ORDER — PRAMIPEXOLE DIHYDROCHLORIDE 1 MG PO TABS: 1.0000 mg | ORAL_TABLET | Freq: Two times a day (BID) | ORAL | 3 refills | Status: DC

## 2022-03-17 MED ORDER — METOPROLOL TARTRATE 25 MG PO TABS: 25.0000 mg | ORAL_TABLET | Freq: Two times a day (BID) | ORAL | 3 refills | Status: DC

## 2022-03-17 MED ORDER — LOSARTAN POTASSIUM-HCTZ 100-25 MG PO TABS: 1.0000 | ORAL_TABLET | Freq: Every day | ORAL | 3 refills | Status: DC

## 2022-03-17 MED ORDER — DULOXETINE HCL 30 MG PO CPEP: 30.0000 mg | ORAL_CAPSULE | Freq: Every day | ORAL | 3 refills | Status: DC

## 2022-03-17 NOTE — Patient Instructions (Addendum)
Thank you for coming to the office today.  All other meds sent to [facility] mail order pharmacy.  ----------  Labs today to check fluid level and kidneys  Start Fluid medication - will urinate more to get rid of excess fluid Take Furosemide Lasix 40mg  once a day for a few days, usually 3-7 days approximately. If not strong enough, can take a 2nd pill in the afternoon. Stop medicine once improved, only use short term.  Likely due to excess fluid due to leaky veins or lymphedema.  Use RICE therapy: - R - Rest / relative rest with activity modification avoid overuse of joint - I - Ice packs (make sure you use a towel or sock / something to protect skin) - C - Compression with stockings or ACE wrap to apply pressure and reduce swelling allowing more support - E - Elevation - if significant swelling, lift leg above heart level (toes above your nose) to help reduce swelling, most helpful at night after day of being on your feet  Use non stick non adherent pad and some antibiotic ointment to protect the areas that are oozing / weeping.  If worsening redness spreading fever chills drainage of pus - sign of infection, let us know ASAP or get it checked out .  I can re route this note to the Vascular doctors. To try to help re schedule.  If you do not hear back, please call them to schedule. Last visit was 04/2021 w Eulogio Ditch NP  [facility] [address] [place],  [place]  [address] Main: [phone]   Please schedule a Follow-up Appointment to: Return if symptoms worsen or fail to improve.  If you have any other questions or concerns, please feel free to call the office or send a message through [place]. You may also schedule an earlier appointment if necessary.  Additionally, you may be receiving a survey about your experience at our office within a few days to 1 week by e-mail or mail. We value your feedback.  Nobie Putnam,  DO [place]

## 2022-03-17 NOTE — Progress Notes (Signed)
Subjective:    Patient ID: Janet [name], female    DOB: 1950-10-17, 72 y.o.   MRN: [id number]  Janet [name] is a 72 y.o. female presenting on 03/17/2022 for Edema   HPI  Venous Insufficiency vs Lymphedema Left Leg Weeping oozing today She reports history of leg edema, last discussed previously in 2023, she had been referred to Vascular, saw [facility] in March 2023. Venous reflux study below showed L sided reflux.  Today here for follow-up now some swelling has worsened with firm edema, and some oozing clear fluid with break in skin. She has done some elevation but not routinely. Slight redness but no worsening pain or fever or drainage of pus.     09/10/2021   11:32 PM 03/08/2021   10:56 AM 10/20/2020    9:08 AM  Depression screen PHQ 2/9  Decreased Interest 1 1 3   Down, Depressed, Hopeless 0 1 1  PHQ - 2 Score 1 2 4   Altered sleeping 0 3 3  Tired, decreased energy 1 0 3  Change in appetite 0 1 0  Feeling bad or failure about yourself  0 0 0  Trouble concentrating 0 1 0  Moving slowly or fidgety/restless 0 0 0  Suicidal thoughts 0 0 0  PHQ-9 Score 2 7 10   Difficult doing work/chores Not difficult at all Not difficult at all Somewhat difficult    Social History   Tobacco Use   Smoking status: Former    Packs/day: 1.00    Years: 25.00    Total pack years: 25.00    Types: Cigarettes    Quit date: 2007    Years since quitting: 17.0   Smokeless tobacco: Former  Scientific laboratory technician Use: Never used  Substance Use Topics   Alcohol use: Yes    Comment: very rarely   Drug use: Never    Review of Systems Per HPI unless specifically indicated above     Objective:    BP (!) 154/72   Pulse 78   Ht 5\' 2"  (1.575 m)   Wt 218 lb (98.9 kg)   SpO2 100%   BMI 39.87 kg/m   Wt Readings from Last 3 Encounters:  03/17/22 218 lb (98.9 kg)  03/15/22 210 lb (95.3 kg)  02/08/22 210 lb 6.4 oz (95.4 kg)    Physical Exam Vitals and nursing note reviewed.  Constitutional:      General:  She is not in acute distress.    Appearance: Normal appearance. She is well-developed. She is obese. She is not diaphoretic.     Comments: Well-appearing, comfortable, cooperative  HENT:     Head: Normocephalic and atraumatic.  Eyes:     General:        Right eye: No discharge.        Left eye: No discharge.     Conjunctiva/sclera: Conjunctivae normal.  Cardiovascular:     Rate and Rhythm: Normal rate.  Pulmonary:     Effort: Pulmonary effort is normal.  Musculoskeletal:     Right lower leg: Edema (venous stasis dermatitis, some firm edema, L leg has few small breaks in the skin some clear fluid oozing) present.     Left lower leg: Edema present.  Skin:    General: Skin is warm and dry.     Findings: No erythema or rash.  Neurological:     Mental Status: She is alert and oriented to person, place, and time.  Psychiatric:  Mood and Affect: Mood normal.        Behavior: Behavior normal.        Thought Content: Thought content normal.     Comments: Well groomed, good eye contact, normal speech and thoughts       Lower Venous Reflux Study   Patient Name:  Janet [name]   Date of Exam:   05/03/2021  Medical Rec #: [id number]   Accession #:    [id number]  Date of Birth: Apr 22, 1950  Patient Gender: F  Patient Age:   49 years  Exam Location:  [facility]  Procedure:      VAS [place] LOWER EXTREMITY VENOUS REFLUX  Referring Phys: Arna Medici BROWN    ---------------------------------------------------------------------------  -----    Indications: Swelling.    Performing Technologist: Almira Coaster RVS     Examination Guidelines: A complete evaluation includes B-mode imaging,  spectral  Doppler, color Doppler, and power Doppler as needed of all accessible  portions  of each vessel. Bilateral testing is considered an integral part of a  complete  examination. Limited examinations for reoccurring indications may be  performed  as noted. The reflux portion of the  exam is performed with the patient in  reverse Trendelenburg.  Significant venous reflux is defined as >500 ms in the superficial venous  system, and >1 second in the deep venous system.     Venous Reflux Times  +--------------+---------+------+-----------+------------+--------+  RIGHT        Reflux NoRefluxReflux TimeDiameter cmsComments                          Yes                                   +--------------+---------+------+-----------+------------+--------+  CFV          no                                              +--------------+---------+------+-----------+------------+--------+  FV prox       no                                              +--------------+---------+------+-----------+------------+--------+  FV mid        no                                              +--------------+---------+------+-----------+------------+--------+  FV dist       no                                              +--------------+---------+------+-----------+------------+--------+  Popliteal    no                                              +--------------+---------+------+-----------+------------+--------+  GSV  at [facility]    no                            .65               +--------------+---------+------+-----------+------------+--------+  GSV prox thighno                            .25               +--------------+---------+------+-----------+------------+--------+  GSV mid thigh no                            .34               +--------------+---------+------+-----------+------------+--------+  GSV dist thighno                            .24               +--------------+---------+------+-----------+------------+--------+  GSV at knee   no                            .18               +--------------+---------+------+-----------+------------+--------+  GSV prox calf no                            .24                +--------------+---------+------+-----------+------------+--------+     +--------------+---------+------+-----------+------------+--------+  LEFT         Reflux NoRefluxReflux TimeDiameter cmsComments                          Yes                                   +--------------+---------+------+-----------+------------+--------+  CFV          no                                              +--------------+---------+------+-----------+------------+--------+  FV prox       no                                              +--------------+---------+------+-----------+------------+--------+  FV mid        no                                              +--------------+---------+------+-----------+------------+--------+  FV dist       no                                              +--------------+---------+------+-----------+------------+--------+  Popliteal  no                                              +--------------+---------+------+-----------+------------+--------+  GSV at [facility]    no                            .81               +--------------+---------+------+-----------+------------+--------+  GSV prox thighno                            .45               +--------------+---------+------+-----------+------------+--------+  GSV mid thigh no                            .28               +--------------+---------+------+-----------+------------+--------+  GSV dist thighno                            .26               +--------------+---------+------+-----------+------------+--------+  GSV at knee   no                            .31               +--------------+---------+------+-----------+------------+--------+  GSV prox calf           yes    689 ms       .24               +--------------+---------+------+-----------+------------+--------+         Summary:  Bilateral:  - No  evidence of deep vein thrombosis seen in the lower extremities,  bilaterally, from the common femoral through the popliteal veins.  - No evidence of superficial venous thrombosis in the lower extremities,  bilaterally.  - No evidence of deep venous insufficiency seen bilaterally in the lower  extremity.    Left:  - Venous reflux is noted in the left greater saphenous vein in the calf.    *See table(s) above for measurements and observations.   Electronically signed by Levora Dredge MD on 05/06/2021 at 4:33:05 PM.        Final      Results for orders placed or performed during the hospital encounter of 01/26/22  Glucose, capillary  Result Value Ref Range   Glucose-Capillary 79 70 - 99 mg/dL  Glucose, capillary  Result Value Ref Range   Glucose-Capillary 123 (H) 70 - 99 mg/dL  Type and screen  Result Value Ref Range   ABO/RH(D) A POS    Antibody Screen NEG    Sample Expiration      01/29/2022,2359 Performed at [facility], [address], [place], [place] [address]   Troponin I (High Sensitivity)  Result Value Ref Range   Troponin I (High Sensitivity) 6 <18 ng/L      Assessment & Plan:   Problem List Items Addressed This Visit     Lymphedema - Primary   Relevant Medications   furosemide (LASIX) 40 MG tablet   Other Relevant Orders  COMPLETE METABOLIC PANEL WITH GFR   Brain natriuretic peptide   Venous insufficiency of both lower extremities   Relevant Medications   furosemide (LASIX) 40 MG tablet   Other Relevant Orders   COMPLETE METABOLIC PANEL WITH GFR   Brain natriuretic peptide   Other Visit Diagnoses     Venous stasis dermatitis of left lower extremity       Relevant Medications   mupirocin ointment (BACTROBAN) 2 %       No history of CHF and based on exam and history, not consistent with CHF Future consideration of ECHO update. But for now will pursue lab with BNP to eval for cardiac cause and chemistry Labs today to check fluid level  and kidneys  Start Fluid medication - will urinate more to get rid of excess fluid Take Furosemide Lasix 40mg  once a day for a few days, usually 3-7 days approximately. If not strong enough, can take a 2nd pill in the afternoon. Stop medicine once improved, only use short term.  Likely due to excess fluid due to leaky veins or lymphedema.  Use RICE therapy: - R - Rest / relative rest with activity modification avoid overuse of joint - I - Ice packs (make sure you use a towel or sock / something to protect skin) - C - Compression with stockings or ACE wrap to apply pressure and reduce swelling allowing more support - E - Elevation - if significant swelling, lift leg above heart level (toes above your nose) to help reduce swelling, most helpful at night after day of being on your feet  Use non stick non adherent pad and some antibiotic ointment to protect the areas that are oozing / weeping.  If worsening redness spreading fever chills drainage of pus - sign of infection, let us know ASAP or get it checked out .  I can re route this note to the Vascular doctors. To try to help re schedule.  She may need Unna boot vs Lymphedema pump  If you do not hear back, please call them to schedule. Last visit was 04/2021 w Eulogio Ditch NP  [facility] [address] [place],  [place]  [address] Main: [phone]  Meds ordered this encounter  Medications   mupirocin ointment (BACTROBAN) 2 %    Sig: Apply 1 Application topically 2 (two) times daily as needed (skin abrasion).    Dispense:  22 g    Refill:  1   furosemide (LASIX) 40 MG tablet    Sig: Take 1 tablet (40 mg total) by mouth daily as needed for edema or fluid. May repeat dose later in day for twice day dosing max    Dispense:  30 tablet    Refill:  2      Follow up plan: Return if symptoms worsen or fail to improve.    Nobie Putnam, [facility] 03/17/2022, 9:45 AM

## 2022-03-18 LAB — COMPLETE METABOLIC PANEL WITH GFR
AG Ratio: 1.3 (calc) (ref 1.0–2.5)
ALT: 13 U/L (ref 6–29)
AST: 17 U/L (ref 10–35)
Albumin: 4 g/dL (ref 3.6–5.1)
Alkaline phosphatase (APISO): 116 U/L (ref 37–153)
BUN: 16 mg/dL (ref 7–25)
CO2: 28 mmol/L (ref 20–32)
Calcium: 9.5 mg/dL (ref 8.6–10.4)
Chloride: 105 mmol/L (ref 98–110)
Creat: 0.67 mg/dL (ref 0.50–1.05)
Globulin: 3.2 g/dL (calc) (ref 1.9–3.7)
Glucose, Bld: 94 mg/dL (ref 65–99)
Potassium: 5.1 mmol/L (ref 3.5–5.3)
Sodium: 141 mmol/L (ref 135–146)
Total Bilirubin: 0.4 mg/dL (ref 0.2–1.2)
Total Protein: 7.2 g/dL (ref 6.1–8.1)
eGFR: 95 mL/min/{1.73_m2} (ref >=60)

## 2022-03-18 LAB — BRAIN NATRIURETIC PEPTIDE: Brain Natriuretic Peptide: 17 pg/mL (ref <100)

## 2022-03-25 ENCOUNTER — Other Ambulatory Visit: Payer: Self-pay | Admitting: Neurosurgery

## 2022-03-25 ENCOUNTER — Ambulatory Visit (INDEPENDENT_AMBULATORY_CARE_PROVIDER_SITE_OTHER): Payer: 59 | Admitting: Nurse Practitioner

## 2022-03-25 ENCOUNTER — Encounter (INDEPENDENT_AMBULATORY_CARE_PROVIDER_SITE_OTHER): Payer: Self-pay | Admitting: Nurse Practitioner

## 2022-03-25 VITALS — BP 122/76 | HR 71 | Ht 62.0 in | Wt 216.6 lb

## 2022-03-25 DIAGNOSIS — I890 Lymphedema, not elsewhere classified: Secondary | ICD-10-CM

## 2022-03-25 DIAGNOSIS — E1169 Type 2 diabetes mellitus with other specified complication: Secondary | ICD-10-CM

## 2022-03-25 DIAGNOSIS — E785 Hyperlipidemia, unspecified: Secondary | ICD-10-CM

## 2022-03-26 ENCOUNTER — Encounter (INDEPENDENT_AMBULATORY_CARE_PROVIDER_SITE_OTHER): Payer: Self-pay | Admitting: Nurse Practitioner

## 2022-03-26 NOTE — Progress Notes (Signed)
Subjective:    Patient ID: Janet [name], female    DOB: [birth year], 72 y.o.   MRN: [id number] Chief Complaint  Patient presents with   Follow-up    Janet [name] is a 72 year old female who presents today after having swelling and wounds and weeping of her bilateral lower extremities as noted by her primary care physician.  Today, these wounds are all healed and the weeping has resolved.  The patient notes that she has previously had treatment of her veins especially in the left lower extremity.  Her previous reflux study showed a small amount of reflux down to the left calf, which is not uncommon following endovenous laser ablation.  Currently the patient does not wear compression regularly.  She does elevate her lower extremities however.    Review of Systems  Cardiovascular:  Positive for leg swelling.  All other systems reviewed and are negative.      Objective:   Physical Exam Vitals reviewed.  HENT:     Head: Normocephalic.  Cardiovascular:     Rate and Rhythm: Normal rate.     Pulses: Normal pulses.  Pulmonary:     Effort: Pulmonary effort is normal.  Musculoskeletal:     Right lower leg: Edema present.     Left lower leg: Edema present.  Skin:    General: Skin is warm and dry.     Comments: Bilateral stasis dermatitis  Neurological:     Mental Status: She is alert and oriented to person, place, and time.  Psychiatric:        Mood and Affect: Mood normal.        Behavior: Behavior normal.        Thought Content: Thought content normal.        Judgment: Judgment normal.     BP 122/76 (BP Location: Right Arm, Patient Position: Sitting, Cuff Size: Large)   Pulse 71   Ht 5\' 2"  (1.575 m)   Wt 216 lb 9.6 oz (98.2 kg)   BMI 39.62 kg/m   Past Medical History:  Diagnosis Date   Anxiety    a.) on BZO (clonazepam) PRN   Cervical spinal stenosis    Depression    Diastolic dysfunction    a.)  TTE 05/2021: EF 90%, mild LVH, trivial MR/TR/PR, G1DD   Esophageal reflux     Esophageal stricture    a.) s/p multiple (2012, 2015, 2017, 2020) dilitation procedures   GERD (gastroesophageal reflux disease)    Heart murmur    Hyperlipidemia    Hypertension    Insomnia    Osteoarthritis    Osteoporosis    Other secondary kyphosis, cervical region    PAD (peripheral artery disease) (HCC)    PONV (postoperative nausea and vomiting)    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 Jude device   RLS (restless legs syndrome)    a.) takes pramipexole   Sleep apnea    SOB (shortness of breath)    T2DM (type 2 diabetes mellitus) (HCC)    Urinary incontinence    Venous insufficiency of both lower extremities     Social History   Socioeconomic History   Marital status: Legally Separated    Spouse name: Janet [name]   Number of children: 6   Years of education: Not on file   Highest education level: Not on file  Occupational History   Occupation: Homemaker  Tobacco Use   Smoking status: Former    Packs/day: 1.00  Years: 25.00    Total pack years: 25.00    Types: Cigarettes    Quit date: 2007    Years since quitting: 17.0    Passive exposure: Never   Smokeless tobacco: Former  Scientific laboratory technician Use: Never used  Substance and Sexual Activity   Alcohol use: Yes    Comment: very rarely   Drug use: Never   Sexual activity: Not Currently  Other Topics Concern   Not on file  Social History Narrative   Not on file   Social Determinants of Health   Financial Resource Strain: Medium Risk (11/08/2021)   Overall Financial Resource Strain (CARDIA)    Difficulty of Paying Living Expenses: Somewhat hard  Food Insecurity: No Food Insecurity (01/31/2022)   Hunger Vital Sign    Worried About Running Out of Food in the Last Year: Never true    Ran Out of Food in the Last Year: Never true  Transportation Needs: No Transportation Needs (01/31/2022)   PRAPARE - Hydrologist (Medical): No    Lack of Transportation  (Non-Medical): No  Physical Activity: Inactive (11/08/2021)   Exercise Vital Sign    Days of Exercise per Week: 0 days    Minutes of Exercise per Session: 0 min  Stress: No Stress Concern Present (11/08/2021)   [place]    Feeling of Stress : Not at all  Social Connections: Socially Isolated (11/08/2021)   Social Connection and Isolation Panel [NHANES]    Frequency of Communication with Friends and Family: More than three times a week    Frequency of Social Gatherings with Friends and Family: More than three times a week    Attends Religious Services: Never    Marine scientist or Organizations: No    Attends Archivist Meetings: Never    Marital Status: Separated  Intimate Partner Violence: Not At Risk (01/26/2022)   Humiliation, Afraid, Rape, and Kick questionnaire    Fear of Current or Ex-Partner: No    Emotionally Abused: No    Physically Abused: No    Sexually Abused: No    Past Surgical History:  Procedure Laterality Date   ANTERIOR CERVICAL DECOMP/DISCECTOMY FUSION N/A 11/03/2021   Procedure: C4-7 ANTERIOR CERVICAL DISCECTOMY AND FUSION (GLOBUS FORGE);  Surgeon: Meade Maw, MD;  Location: [facility] OR[facility];  Service: Neurosurgery;  Laterality: N/A;   ANTERIOR LATERAL LUMBAR FUSION WITH PERCUTANEOUS SCREW 2 LEVEL N/A 01/26/2022   Procedure: L3-5 LATERAL LUMBAR INTERBODY FUSION WITH POSTERIOR SPINAL FUSION;  Surgeon: Meade Maw, MD;  Location: [facility];  Service: Neurosurgery;  Laterality: N/A;   APPENDECTOMY  5852   APPLICATION OF INTRAOPERATIVE CT SCAN N/A 01/26/2022   Procedure: APPLICATION OF INTRAOPERATIVE CT SCAN;  Surgeon: Meade Maw, MD;  Location: [facility];  Service: Neurosurgery;  Laterality: N/A;   CARPAL TUNNEL RELEASE Left 1998   ESOPHAGEAL DILATION  2012   2015, 2017, 2020   GASTRIC BYPASS  2019   HERNIA REPAIR  2019   left knee manipulation Left 02/05/2019    PACEMAKER IMPLANT  05/2015   Procedure: PACEMAKER INSERTION; Location: [facility] [place]; Surgeon: Elvia Collum, MD   POLYPECTOMY     SHOULDER SURGERY Left 2014   SHOULDER SURGERY Right 2017   TOTAL KNEE ARTHROPLASTY Right 2018   TOTAL KNEE ARTHROPLASTY Left 10/18/2018   vein removal Bilateral 2012    Family History  Problem Relation Age of Onset   Heart attack  Mother 53   Heart disease Father    Thyroid disease Father    Skin cancer Father    Brain cancer Maternal Grandmother     Allergies  Allergen Reactions   Celebrex [Celecoxib] Other (See Comments)    Burning all over, increased heartbeat, upset stomach       Latest Ref Rng & Units 12/24/2021   11:35 AM 09/07/2021    8:16 AM 02/26/2021   11:59 AM  CBC  WBC 4.0 - 10.5 K/uL 8.2  8.0  6.6   Hemoglobin 12.0 - 15.0 g/dL 81.1  91.4  78.2   Hematocrit 36.0 - 46.0 % 40.7  37.0  38.7   Platelets 150 - 400 K/uL 284  236  245       CMP     Component Value Date/Time   NA 141 03/17/2022 1030   K 5.1 03/17/2022 1030   CL 105 03/17/2022 1030   CO2 28 03/17/2022 1030   GLUCOSE 94 03/17/2022 1030   BUN 16 03/17/2022 1030   CREATININE 0.67 03/17/2022 1030   CALCIUM 9.5 03/17/2022 1030   PROT 7.2 03/17/2022 1030   AST 17 03/17/2022 1030   ALT 13 03/17/2022 1030   BILITOT 0.4 03/17/2022 1030   GFRNONAA >60 12/24/2021 1135   GFRNONAA 93 09/12/2019 0803   GFRAA 107 09/12/2019 0803     No results found.     Assessment & Plan:   1. Lymphedema The weeping and the wounds that the patient previously had are resolved. The patient had a small amount of reflux in her left lower extremity she has previously had an endovenous laser ablation and repeat ablation would likely not result in a drastic improvement in symptoms.  We discussed about adherence to conservative therapy including use of medical grade compression socks, elevation and activity.  The patient will try to adhere to conservative therapy for the next several months.  Will  have her return to evaluate her progress and she may possibly benefit from lymphedema pump if her symptoms have not drastically improved.  2. Type 2 diabetes mellitus with other specified complication, without long-term current use of insulin (HCC) Continue hypoglycemic medications as already ordered, these medications have been reviewed and there are no changes at this time.  Hgb A1C to be monitored as already arranged by primary service  3. Hyperlipidemia associated with type 2 diabetes mellitus (HCC) Continue statin as ordered and reviewed, no changes at this time   Current Outpatient Medications on File Prior to Visit  Medication Sig Dispense Refill   aspirin 81 MG EC tablet Take 81 mg by mouth daily.     atorvastatin (LIPITOR) 40 MG tablet Take 1 tablet (40 mg total) by mouth at bedtime. 90 tablet 3   DULoxetine (CYMBALTA) 30 MG capsule Take 1 capsule (30 mg total) by mouth daily. 90 capsule 3   gabapentin (NEURONTIN) 300 MG capsule Take 300 mg by mouth 2 (two) times daily.     losartan-hydrochlorothiazide (HYZAAR) 100-25 MG tablet Take 1 tablet by mouth daily. 90 tablet 3   metoprolol tartrate (LOPRESSOR) 25 MG tablet Take 1 tablet (25 mg total) by mouth 2 (two) times daily. 180 tablet 3   pramipexole (MIRAPEX) 1 MG tablet Take 1 tablet (1 mg total) by mouth 2 (two) times daily. 180 tablet 3   No current facility-administered medications on file prior to visit.    There are no Patient Instructions on file for this visit. No follow-ups on file.  Kris Hartmann, NP

## 2022-03-29 ENCOUNTER — Encounter: Payer: Medicare Other | Admitting: Neurosurgery

## 2022-04-25 ENCOUNTER — Other Ambulatory Visit: Payer: Self-pay

## 2022-04-25 DIAGNOSIS — Z981 Arthrodesis status: Secondary | ICD-10-CM

## 2022-04-26 ENCOUNTER — Ambulatory Visit
Admission: RE | Admit: 2022-04-26 | Discharge: 2022-04-26 | Disposition: A | Discharge status: Home / Self Care | Payer: 59 | Source: Ambulatory Visit | Attending: Neurosurgery | Admitting: Neurosurgery

## 2022-04-26 ENCOUNTER — Encounter: Payer: Self-pay | Admitting: Neurosurgery

## 2022-04-26 ENCOUNTER — Ambulatory Visit (INDEPENDENT_AMBULATORY_CARE_PROVIDER_SITE_OTHER): Payer: 59 | Admitting: Neurosurgery

## 2022-04-26 ENCOUNTER — Ambulatory Visit
Admission: RE | Admit: 2022-04-26 | Discharge: 2022-04-26 | Disposition: A | Discharge status: Home / Self Care | Payer: 59 | Attending: Neurosurgery | Admitting: Neurosurgery

## 2022-04-26 VITALS — BP 144/78 | HR 69 | Temp 98.3°F | Ht 62.0 in | Wt 214.6 lb

## 2022-04-26 DIAGNOSIS — M5441 Lumbago with sciatica, right side: Secondary | ICD-10-CM

## 2022-04-26 DIAGNOSIS — M4322 Fusion of spine, cervical region: Secondary | ICD-10-CM | POA: Diagnosis not present

## 2022-04-26 DIAGNOSIS — Z981 Arthrodesis status: Secondary | ICD-10-CM | POA: Diagnosis not present

## 2022-04-26 DIAGNOSIS — M4316 Spondylolisthesis, lumbar region: Secondary | ICD-10-CM

## 2022-04-26 DIAGNOSIS — M5442 Lumbago with sciatica, left side: Secondary | ICD-10-CM

## 2022-04-26 DIAGNOSIS — M4326 Fusion of spine, lumbar region: Secondary | ICD-10-CM | POA: Diagnosis not present

## 2022-04-26 DIAGNOSIS — Z09 Encounter for follow-up examination after completed treatment for conditions other than malignant neoplasm: Secondary | ICD-10-CM

## 2022-04-26 NOTE — Progress Notes (Signed)
   REFERRING PHYSICIAN:  Andee Poles [address] [place],  [place] [address]  DOS: 01/26/22 XLIF/PSF L3-L5  HISTORY OF PRESENT ILLNESS: Janet [name] is status post XLIF/PSF L3-L5.  She is doing well.  She has minimal back and leg pain.    She is doing very well.  PHYSICAL EXAMINATION:  NEUROLOGICAL:  General: In no acute distress.   Awake, alert, oriented to person, place, and time.  Pupils equal round and reactive to light.  Facial tone is symmetric.    Strength: Side Biceps Triceps Deltoid Interossei Grip Wrist Ext. Wrist Flex.  R 5 5 5 5 5 5 5  $ L 5 5 5 5 5 5 5   $ Incisions c/d/i  Imaging:  No complications noted on cervical and lumbar spine x-rays  Assessment / Plan: Janet [name] is doing well s/p above surgery.  I am very pleased with her improvement in symptoms.  I will see her back in 6 months with x-rays.  She is off activity limitations.  Meade Maw MD Dept of Neurosurgery

## 2022-04-27 DIAGNOSIS — M48061 Spinal stenosis, lumbar region without neurogenic claudication: Secondary | ICD-10-CM | POA: Diagnosis not present

## 2022-04-27 DIAGNOSIS — R202 Paresthesia of skin: Secondary | ICD-10-CM | POA: Diagnosis not present

## 2022-04-27 DIAGNOSIS — R29898 Other symptoms and signs involving the musculoskeletal system: Secondary | ICD-10-CM | POA: Diagnosis not present

## 2022-04-27 DIAGNOSIS — G2581 Restless legs syndrome: Secondary | ICD-10-CM | POA: Diagnosis not present

## 2022-04-27 DIAGNOSIS — G629 Polyneuropathy, unspecified: Secondary | ICD-10-CM | POA: Diagnosis not present

## 2022-04-27 DIAGNOSIS — R200 Anesthesia of skin: Secondary | ICD-10-CM | POA: Diagnosis not present

## 2022-04-27 DIAGNOSIS — G959 Disease of spinal cord, unspecified: Secondary | ICD-10-CM | POA: Diagnosis not present

## 2022-04-29 ENCOUNTER — Telehealth: Payer: Self-pay | Admitting: *Deleted

## 2022-04-29 NOTE — Telephone Encounter (Signed)
Janet [name], [place]- yearly health visit Calling to report patient fall yesterday. Patient reports she was getting out of bed last night and feet/sock got "caught up" on the carpet. Patient reports she fell backward and hit back and possibly her head- she had a headache afterward. Patient was assessed: cranial nerve assessment, strength and memory test and everything is normal. Patient was educated on falls and advised to go to ED if she should ever fall and hit her head again. Patient was advised to call office to report the fall- but NP is not sure that she will. Requesting follow up if PCP feels needed.

## 2022-04-29 NOTE — Telephone Encounter (Signed)
Okay to follow up as needed. It sounds like at the present time, no additional care required  Janet [name], [facility] 04/29/2022, 1:55 PM

## 2022-05-02 DIAGNOSIS — K219 Gastro-esophageal reflux disease without esophagitis: Secondary | ICD-10-CM | POA: Diagnosis not present

## 2022-05-02 DIAGNOSIS — Z950 Presence of cardiac pacemaker: Secondary | ICD-10-CM | POA: Diagnosis not present

## 2022-05-02 DIAGNOSIS — G4733 Obstructive sleep apnea (adult) (pediatric): Secondary | ICD-10-CM | POA: Diagnosis not present

## 2022-05-02 DIAGNOSIS — E119 Type 2 diabetes mellitus without complications: Secondary | ICD-10-CM | POA: Diagnosis not present

## 2022-05-02 DIAGNOSIS — R0789 Other chest pain: Secondary | ICD-10-CM | POA: Diagnosis not present

## 2022-05-02 DIAGNOSIS — I739 Peripheral vascular disease, unspecified: Secondary | ICD-10-CM | POA: Diagnosis not present

## 2022-05-02 DIAGNOSIS — E785 Hyperlipidemia, unspecified: Secondary | ICD-10-CM | POA: Diagnosis not present

## 2022-05-02 DIAGNOSIS — R609 Edema, unspecified: Secondary | ICD-10-CM | POA: Diagnosis not present

## 2022-05-02 DIAGNOSIS — I10 Essential (primary) hypertension: Secondary | ICD-10-CM | POA: Diagnosis not present

## 2022-05-10 ENCOUNTER — Encounter: Payer: Medicare Other | Admitting: Neurosurgery

## 2022-05-13 DIAGNOSIS — R600 Localized edema: Secondary | ICD-10-CM | POA: Diagnosis not present

## 2022-05-13 DIAGNOSIS — R0789 Other chest pain: Secondary | ICD-10-CM | POA: Diagnosis not present

## 2022-06-12 ENCOUNTER — Other Ambulatory Visit: Payer: Self-pay | Admitting: Family Medicine

## 2022-06-12 DIAGNOSIS — I872 Venous insufficiency (chronic) (peripheral): Secondary | ICD-10-CM

## 2022-06-12 DIAGNOSIS — I890 Lymphedema, not elsewhere classified: Secondary | ICD-10-CM

## 2022-06-13 NOTE — Telephone Encounter (Signed)
Unable to refill per protocol, Rx expired. Discontinued 03/25/22.  Requested Prescriptions  Pending Prescriptions Disp Refills   furosemide (LASIX) 40 MG tablet [Pharmacy Med Name: FUROSEMIDE 40MG  TABLETS] 30 tablet 2    Sig: TAKE 1 TABLET(40 MG) BY MOUTH DAILY FLUID AS NEEDED FOR SWELLING. MAY REPEAT DOSE LATER IN DAY FOR 2 TIMES DAILY DOSING. MAX     Cardiovascular:  Diuretics - Loop Failed - 06/12/2022  7:47 AM      Failed - Mg Level in normal range and within 180 days    No results found for: "MG"       Failed - Last BP in normal range    BP Readings from Last 1 Encounters:  04/26/22 (!) 144/78         Passed - K in normal range and within 180 days    Potassium  Date Value Ref Range Status  03/17/2022 5.1 3.5 - 5.3 mmol/L Final         Passed - Ca in normal range and within 180 days    Calcium  Date Value Ref Range Status  03/17/2022 9.5 8.6 - 10.4 mg/dL Final         Passed - Na in normal range and within 180 days    Sodium  Date Value Ref Range Status  03/17/2022 141 135 - 146 mmol/L Final         Passed - Cr in normal range and within 180 days    Creat  Date Value Ref Range Status  03/17/2022 0.67 0.50 - 1.05 mg/dL Final   Creatinine, Urine  Date Value Ref Range Status  09/10/2021 32 20 - 275 mg/dL Final         Passed - Cl in normal range and within 180 days    Chloride  Date Value Ref Range Status  03/17/2022 105 98 - 110 mmol/L Final         Passed - Valid encounter within last 6 months    Recent Outpatient Visits           2 months ago Lymphedema   [place] [facility] Smitty Cords, DO   9 months ago Annual physical exam   [place] [facility] Smitty Cords, DO   1 year ago Venous stasis dermatitis of left lower extremity   [place] [facility] Smitty Cords, DO   1 year ago Annual physical exam   [place] [facility] Smitty Cords, DO   2 years ago Restless leg syndrome   [place] [facility] [place], [place], [place]

## 2022-06-24 ENCOUNTER — Ambulatory Visit (INDEPENDENT_AMBULATORY_CARE_PROVIDER_SITE_OTHER): Payer: 59 | Admitting: Nurse Practitioner

## 2022-07-01 ENCOUNTER — Encounter: Payer: Self-pay | Admitting: Family Medicine

## 2022-07-01 ENCOUNTER — Ambulatory Visit (INDEPENDENT_AMBULATORY_CARE_PROVIDER_SITE_OTHER): Payer: 59 | Admitting: Family Medicine

## 2022-07-01 VITALS — BP 134/72 | HR 77 | Ht 62.0 in | Wt 217.0 lb

## 2022-07-01 DIAGNOSIS — I10 Essential (primary) hypertension: Secondary | ICD-10-CM

## 2022-07-01 DIAGNOSIS — E785 Hyperlipidemia, unspecified: Secondary | ICD-10-CM

## 2022-07-01 DIAGNOSIS — G8929 Other chronic pain: Secondary | ICD-10-CM

## 2022-07-01 DIAGNOSIS — M5442 Lumbago with sciatica, left side: Secondary | ICD-10-CM | POA: Diagnosis not present

## 2022-07-01 DIAGNOSIS — E1169 Type 2 diabetes mellitus with other specified complication: Secondary | ICD-10-CM

## 2022-07-01 NOTE — Progress Notes (Signed)
Subjective:    Patient ID: Janet [name], female    DOB: 20-Apr-1950, 72 y.o.   MRN: [id number]  Haleyann Woodfork is a 72 y.o. female presenting on 07/01/2022 for traveling and Diabetes   HPI  She is traveling to [place] and will need medical documentation for travel purposes.  CHRONIC DM, Type 2: Reports no concerns since bariatric surgery wt loss, major improvement A1c last resulted 5.9, off of medication Meds: None (no longer on Metformin) Reports good compliance. Tolerating well w/o side-effects Currently on ARB Lifestyle: - Diet (improved)  She had Eye Exam glasses at [facility], will schedule DM eye Denies hypoglycemia, polyuria, visual changes, numbness or tingling.   HYPERLIPIDEMIA: - Reports no concerns. Last lipid panel 08/2021, controlled on statin - Currently taking Atorvastatin 40mg , tolerating well without side effects or myalgias   Chronic Low Back Pain Chronic Osteoarthritis, Lumbar DDD / Sciatica Left Leg Pain and Numbness / Possible peripheral neuropathy Chronic L knee pain, history of osteoarthritis, s/p prior L knee TKR surgery and repeat procedure - Prior R knee replacement in past with good results in [facility] - August 2020, left knee replacement, they did repeat knee procedure manipulation 01/2019 - Did PT, still has pain and swelling of left lower extremity  Completed PT and still continuing, with limited relief. History of L thigh pain numbness Some improved on Duloxetine. Takes Baclofen PRN muscle spasm.   Varicose veins Venous Insufficiency   History of Bariatric Surgery - Gastric Bypass - She has history of type 2 diabetes. Her A1c has been controlled after surgery    Restless Leg Syndrome / Insomnia Major Depression recurrent in remission   CHRONIC HTN / s/p Pacemaker Reports home BP readings 120s/60-70s. Checks it at home occasionally Followed by [facility] for BP and Pacemaker. Current Meds - Losartan-HCTZ 100-25mg  daily, Metoprolol  tartrate 25mg  nightly Reports good compliance, took meds today. Tolerating well, w/o complaints. Denies CP, dyspnea, HA, edema, dizziness / lightheadedness       07/01/2022    8:59 AM 09/10/2021   11:32 PM 03/08/2021   10:56 AM  Depression screen PHQ 2/9  Decreased Interest 0 1 1  Down, Depressed, Hopeless 0 0 1  PHQ - 2 Score 0 1 2  Altered sleeping  0 3  Tired, decreased energy  1 0  Change in appetite  0 1  Feeling bad or failure about yourself   0 0  Trouble concentrating  0 1  Moving slowly or fidgety/restless  0 0  Suicidal thoughts  0 0  PHQ-9 Score  2 7  Difficult doing work/chores  Not difficult at all Not difficult at all    Social History   Tobacco Use   Smoking status: Former    Packs/day: 1.00    Years: 25.00    Additional pack years: 0.00    Total pack years: 25.00    Types: Cigarettes    Quit date: 2007    Years since quitting: 17.3    Passive exposure: Never   Smokeless tobacco: Former  Building services engineer Use: Never used  Substance Use Topics   Alcohol use: Yes    Comment: very rarely   Drug use: Never    Review of Systems Per HPI unless specifically indicated above     Objective:    BP 134/72 (BP Location: Left Arm, Cuff Size: Normal)   Pulse 77   Ht 5\' 2"  (1.575 m)   Wt 217 lb (98.4 kg)  SpO2 100%   BMI 39.69 kg/m   Wt Readings from Last 3 Encounters:  07/01/22 217 lb (98.4 kg)  04/26/22 214 lb 9.6 oz (97.3 kg)  03/25/22 216 lb 9.6 oz (98.2 kg)    Physical Exam Vitals and nursing note reviewed.  Constitutional:      General: She is not in acute distress.    Appearance: Normal appearance. She is well-developed. She is not diaphoretic.     Comments: Well-appearing, comfortable, cooperative  HENT:     Head: Normocephalic and atraumatic.  Eyes:     General:        Right eye: No discharge.        Left eye: No discharge.     Conjunctiva/sclera: Conjunctivae normal.  Cardiovascular:     Rate and Rhythm: Normal rate.  Pulmonary:      Effort: Pulmonary effort is normal.  Skin:    General: Skin is warm and dry.     Findings: No erythema or rash.  Neurological:     Mental Status: She is alert and oriented to person, place, and time.  Psychiatric:        Mood and Affect: Mood normal.        Behavior: Behavior normal.        Thought Content: Thought content normal.     Comments: Well groomed, good eye contact, normal speech and thoughts    Results for orders placed or performed in visit on 03/17/22  COMPLETE METABOLIC PANEL WITH GFR  Result Value Ref Range   Glucose, Bld 94 65 - 99 mg/dL   BUN 16 7 - 25 mg/dL   Creat 1.61 0.96 - 0.45 mg/dL   eGFR 95 > OR = 60 WU/JWJ/1.91Y7   BUN/Creatinine Ratio SEE NOTE: 6 - 22 (calc)   Sodium 141 135 - 146 mmol/L   Potassium 5.1 3.5 - 5.3 mmol/L   Chloride 105 98 - 110 mmol/L   CO2 28 20 - 32 mmol/L   Calcium 9.5 8.6 - 10.4 mg/dL   Total Protein 7.2 6.1 - 8.1 g/dL   Albumin 4.0 3.6 - 5.1 g/dL   Globulin 3.2 1.9 - 3.7 g/dL (calc)   AG Ratio 1.3 1.0 - 2.5 (calc)   Total Bilirubin 0.4 0.2 - 1.2 mg/dL   Alkaline phosphatase (APISO) 116 37 - 153 U/L   AST 17 10 - 35 U/L   ALT 13 6 - 29 U/L  Brain natriuretic peptide  Result Value Ref Range   Brain Natriuretic Peptide 17 <100 pg/mL      Assessment & Plan:   Problem List Items Addressed This Visit     Hyperlipidemia associated with type 2 diabetes mellitus (HCC)   Type 2 diabetes mellitus with other specified complication (HCC) - Primary   Other Visit Diagnoses     Essential hypertension       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  Letter written for patient with her medication conditions, medications, and past surgery. She will utilize this for travel purposes.  Initial elevated BP Repeat manual BP improved today.  Continue current therapy  No orders of the defined types were placed in this encounter.     Follow up plan: Return in about 4  months (around 11/01/2022) for upcoming annual 9/20.   Saralyn Pilar, DO [facility] 07/01/2022, 8:58 AM

## 2022-07-01 NOTE — Patient Instructions (Addendum)
Thank you for coming to the office today.  Letter today  Keep watch on BP  Let me know if run out of any medications.  DUE for FASTING BLOOD WORK (no food or drink after midnight before the lab appointment, only water or coffee without cream/sugar on the morning of)  Next time  Please schedule a Follow-up Appointment to: Return in about 4 months (around 11/01/2022) for upcoming annual 9/20.  If you have any other questions or concerns, please feel free to call the office or send a message through MyChart. You may also schedule an earlier appointment if necessary.  Additionally, you may be receiving a survey about your experience at our office within a few days to 1 week by e-mail or mail. We value your feedback.  Saralyn Pilar, DO [facility], [place]

## 2022-07-06 DIAGNOSIS — K219 Gastro-esophageal reflux disease without esophagitis: Secondary | ICD-10-CM | POA: Diagnosis not present

## 2022-07-06 DIAGNOSIS — E785 Hyperlipidemia, unspecified: Secondary | ICD-10-CM | POA: Diagnosis not present

## 2022-07-06 DIAGNOSIS — R0789 Other chest pain: Secondary | ICD-10-CM | POA: Diagnosis not present

## 2022-07-06 DIAGNOSIS — Z950 Presence of cardiac pacemaker: Secondary | ICD-10-CM | POA: Diagnosis not present

## 2022-07-06 DIAGNOSIS — I739 Peripheral vascular disease, unspecified: Secondary | ICD-10-CM | POA: Diagnosis not present

## 2022-07-06 DIAGNOSIS — E119 Type 2 diabetes mellitus without complications: Secondary | ICD-10-CM | POA: Diagnosis not present

## 2022-07-06 DIAGNOSIS — I10 Essential (primary) hypertension: Secondary | ICD-10-CM | POA: Diagnosis not present

## 2022-07-06 DIAGNOSIS — G4733 Obstructive sleep apnea (adult) (pediatric): Secondary | ICD-10-CM | POA: Diagnosis not present

## 2022-07-08 ENCOUNTER — Emergency Department: Payer: 59

## 2022-07-08 ENCOUNTER — Emergency Department
Admission: EM | Admit: 2022-07-08 | Discharge: 2022-07-08 | Disposition: A | Discharge status: Home / Self Care | Payer: 59 | Attending: Emergency Medicine | Admitting: Emergency Medicine

## 2022-07-08 DIAGNOSIS — M7989 Other specified soft tissue disorders: Secondary | ICD-10-CM | POA: Insufficient documentation

## 2022-07-08 DIAGNOSIS — N19 Unspecified kidney failure: Secondary | ICD-10-CM | POA: Insufficient documentation

## 2022-07-08 DIAGNOSIS — Z5321 Procedure and treatment not carried out due to patient leaving prior to being seen by health care provider: Secondary | ICD-10-CM | POA: Insufficient documentation

## 2022-07-08 LAB — COMPREHENSIVE METABOLIC PANEL
ALT: 20 U/L (ref 0–44)
AST: 24 U/L (ref 15–41)
Albumin: 4.2 g/dL (ref 3.5–5.0)
Alkaline Phosphatase: 101 U/L (ref 38–126)
Anion gap: 8 (ref 5–15)
BUN: 16 mg/dL (ref 8–23)
CO2: 25 mmol/L (ref 22–32)
Calcium: 8.8 mg/dL — ABNORMAL LOW (ref 8.9–10.3)
Chloride: 106 mmol/L (ref 98–111)
Creatinine, Ser: 0.52 mg/dL (ref 0.44–1.00)
GFR, Estimated: >60 mL/min (ref >60)
Glucose, Bld: 99 mg/dL (ref 70–99)
Potassium: 3.8 mmol/L (ref 3.5–5.1)
Sodium: 139 mmol/L (ref 135–145)
Total Bilirubin: 1 mg/dL (ref 0.3–1.2)
Total Protein: 7.6 g/dL (ref 6.5–8.1)

## 2022-07-08 LAB — CBC
HCT: 40.1 % (ref 36.0–46.0)
Hemoglobin: 12.3 g/dL (ref 12.0–15.0)
MCH: 26.9 pg (ref 26.0–34.0)
MCHC: 30.7 g/dL (ref 30.0–36.0)
MCV: 87.6 fL (ref 80.0–100.0)
Platelets: 293 10*3/uL (ref 150–400)
RBC: 4.58 MIL/uL (ref 3.87–5.11)
RDW: 17 % — ABNORMAL HIGH (ref 11.5–15.5)
WBC: 6.9 10*3/uL (ref 4.0–10.5)
nRBC: 0 % (ref 0.0–0.2)

## 2022-07-08 LAB — BRAIN NATRIURETIC PEPTIDE: B Natriuretic Peptide: 26.7 pg/mL (ref 0.0–100.0)

## 2022-07-08 NOTE — ED Notes (Signed)
This RN did not see pt near bed or bathroom. Front desk notified. First Nurse reported pt was threatening to leave at 1500 if not seen when she was in the lobby. MD and CN notified.

## 2022-07-08 NOTE — ED Provider Triage Note (Signed)
Emergency Medicine Provider Triage Evaluation Note  Janet [name] , a 72 y.o. female  was evaluated in triage.  Pt complains of with history of kidney failure, unsure if she has had heart failure, presents with swelling legs.  States she has TED hose on but it is not helping.  No fever or chills.  No chest pain or shortness of breath.  Review of Systems  Positive:  Negative:   Physical Exam  BP (!) 181/93 (BP Location: Left Arm)   Pulse 81   Temp 97.8 F (36.6 C) (Oral)   Resp 17   Wt 99.8 kg   SpO2 100%   BMI 40.24 kg/m  Gen:   Awake, no distress   Resp:  Normal effort  MSK:   Moves extremities without difficulty, swelling noted in LE b/lo Other:  Labs and imaging ordered  Medical Decision Making  Medically screening exam initiated at 11:58 AM.  Appropriate orders placed.  Janet [name] was informed that the remainder of the evaluation will be completed by another provider, this initial triage assessment does not replace that evaluation, and the importance of remaining in the ED until their evaluation is complete.     Faythe Ghee, PA-C 07/08/22 1159

## 2022-07-08 NOTE — ED Triage Notes (Signed)
Pt sts that since yesterday she has been having more swelling and pain in her legs.

## 2022-09-16 LAB — MICROALBUMIN / CREATININE URINE RATIO: Microalb Creat Ratio: 29.999

## 2022-10-06 ENCOUNTER — Encounter: Payer: Self-pay | Admitting: Family Medicine

## 2022-10-24 ENCOUNTER — Other Ambulatory Visit: Payer: Self-pay

## 2022-10-24 DIAGNOSIS — M4316 Spondylolisthesis, lumbar region: Secondary | ICD-10-CM

## 2022-10-24 DIAGNOSIS — G959 Disease of spinal cord, unspecified: Secondary | ICD-10-CM

## 2022-10-25 ENCOUNTER — Ambulatory Visit: Payer: 59 | Admitting: Neurosurgery

## 2022-10-25 ENCOUNTER — Ambulatory Visit
Admission: RE | Admit: 2022-10-25 | Discharge: 2022-10-25 | Disposition: A | Discharge status: Home / Self Care | Payer: 59 | Attending: Neurosurgery | Admitting: Neurosurgery

## 2022-10-25 ENCOUNTER — Ambulatory Visit
Admission: RE | Admit: 2022-10-25 | Discharge: 2022-10-25 | Disposition: A | Discharge status: Home / Self Care | Payer: 59 | Source: Ambulatory Visit | Attending: Neurosurgery | Admitting: Neurosurgery

## 2022-10-25 ENCOUNTER — Encounter: Payer: Self-pay | Admitting: Neurosurgery

## 2022-10-25 VITALS — BP 138/82 | Ht 62.0 in | Wt 218.0 lb

## 2022-10-25 DIAGNOSIS — Z981 Arthrodesis status: Secondary | ICD-10-CM | POA: Diagnosis not present

## 2022-10-25 DIAGNOSIS — Z09 Encounter for follow-up examination after completed treatment for conditions other than malignant neoplasm: Secondary | ICD-10-CM | POA: Diagnosis not present

## 2022-10-25 DIAGNOSIS — G959 Disease of spinal cord, unspecified: Secondary | ICD-10-CM | POA: Diagnosis not present

## 2022-10-25 DIAGNOSIS — M4316 Spondylolisthesis, lumbar region: Secondary | ICD-10-CM

## 2022-10-25 DIAGNOSIS — M5126 Other intervertebral disc displacement, lumbar region: Secondary | ICD-10-CM | POA: Diagnosis not present

## 2022-10-25 NOTE — Progress Notes (Signed)
   REFERRING PHYSICIAN:  Kasandra Knudsen [address] [place],  [place] [address]  DOS: 01/26/22 XLIF/PSF L3-L5  HISTORY OF PRESENT ILLNESS: Janet [name] is status post XLIF/PSF L3-L5.  She is doing well.    She has some numbness on the lateral aspect of her left thigh, but is overall doing quite well.  PHYSICAL EXAMINATION:  NEUROLOGICAL:  General: In no acute distress.   Awake, alert, oriented to person, place, and time.  Pupils equal round and reactive to light.  Facial tone is symmetric.    Strength: Side Biceps Triceps Deltoid Interossei Grip Wrist Ext. Wrist Flex.  R 5 5 5 5 5 5 5   L 5 5 5 5 5 5 5    Incisions c/d/i  Imaging:  No complications noted on cervical and lumbar spine x-rays  Assessment / Plan: Janet [name] is doing well s/p above surgery.  I am very pleased with her improvement in symptoms.  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  She would not like to pursue any intervention at this time.  I will see her back on an as-needed basis.  If she has worsening symptoms in her left thigh, I will likely send her for a nerve conduction study.    I spent a total of 10 minutes in this patient's care today. This time was spent reviewing pertinent records including imaging studies, obtaining and confirming history, performing a directed evaluation, formulating and discussing my recommendations, and documenting the visit within the medical record.   [name] MD Dept of Neurosurgery

## 2022-10-26 DIAGNOSIS — R079 Chest pain, unspecified: Secondary | ICD-10-CM | POA: Diagnosis not present

## 2022-10-26 DIAGNOSIS — R600 Localized edema: Secondary | ICD-10-CM | POA: Diagnosis not present

## 2022-10-26 DIAGNOSIS — G2581 Restless legs syndrome: Secondary | ICD-10-CM | POA: Diagnosis not present

## 2022-10-26 DIAGNOSIS — R29898 Other symptoms and signs involving the musculoskeletal system: Secondary | ICD-10-CM | POA: Diagnosis not present

## 2022-10-26 DIAGNOSIS — R202 Paresthesia of skin: Secondary | ICD-10-CM | POA: Diagnosis not present

## 2022-10-26 DIAGNOSIS — M79604 Pain in right leg: Secondary | ICD-10-CM | POA: Diagnosis not present

## 2022-10-26 DIAGNOSIS — G8929 Other chronic pain: Secondary | ICD-10-CM | POA: Diagnosis not present

## 2022-10-26 DIAGNOSIS — M79601 Pain in right arm: Secondary | ICD-10-CM | POA: Diagnosis not present

## 2022-10-26 DIAGNOSIS — Z950 Presence of cardiac pacemaker: Secondary | ICD-10-CM | POA: Diagnosis not present

## 2022-10-26 DIAGNOSIS — M79641 Pain in right hand: Secondary | ICD-10-CM | POA: Diagnosis not present

## 2022-10-26 DIAGNOSIS — R200 Anesthesia of skin: Secondary | ICD-10-CM | POA: Diagnosis not present

## 2022-10-26 DIAGNOSIS — G959 Disease of spinal cord, unspecified: Secondary | ICD-10-CM | POA: Diagnosis not present

## 2022-11-16 ENCOUNTER — Other Ambulatory Visit: Payer: Self-pay | Admitting: Family Medicine

## 2022-11-16 DIAGNOSIS — I10 Essential (primary) hypertension: Secondary | ICD-10-CM

## 2022-11-17 NOTE — Telephone Encounter (Signed)
Rx 03/07/22 #90 3RF- too soon Requested Prescriptions  Pending Prescriptions Disp Refills   losartan-hydrochlorothiazide (HYZAAR) 100-25 MG tablet [Pharmacy Med Name: Losartan Potassium-HCTZ 100-25 MG Oral Tablet] 100 tablet 2    Sig: TAKE 1 TABLET BY MOUTH DAILY     Cardiovascular: ARB + Diuretic Combos Passed - 11/16/2022  6:45 AM      Passed - K in normal range and within 180 days    Potassium  Date Value Ref Range Status  07/08/2022 3.8 3.5 - 5.1 mmol/L Final         Passed - Na in normal range and within 180 days    Sodium  Date Value Ref Range Status  07/08/2022 139 135 - 145 mmol/L Final         Passed - Cr in normal range and within 180 days    Creat  Date Value Ref Range Status  03/17/2022 0.67 0.50 - 1.05 mg/dL Final   Creatinine, Ser  Date Value Ref Range Status  07/08/2022 0.52 0.44 - 1.00 mg/dL Final   Creatinine, Urine  Date Value Ref Range Status  09/10/2021 32 20 - 275 mg/dL Final         Passed - eGFR is 10 or above and within 180 days    GFR, Est African American  Date Value Ref Range Status  09/12/2019 107 > OR = 60 mL/min/1.28m2 Final   GFR, Est Non African American  Date Value Ref Range Status  09/12/2019 93 > OR = 60 mL/min/1.52m2 Final   GFR, Estimated  Date Value Ref Range Status  07/08/2022 >60 >60 mL/min Final    Comment:    (NOTE) Calculated using the CKD-EPI Creatinine Equation (2021)    eGFR  Date Value Ref Range Status  03/17/2022 95 > OR = 60 mL/min/1.38m2 Final         Passed - Patient is not pregnant      Passed - Last BP in normal range    BP Readings from Last 1 Encounters:  10/25/22 138/82         Passed - Valid encounter within last 6 months    Recent Outpatient Visits           4 months ago Type 2 diabetes mellitus with other specified complication, without long-term current use of insulin [facility])   [place] [facility] Smitty Cords, DO   8 months ago Lymphedema   [place] [facility] Smitty Cords, DO   1 year ago Annual physical exam   [place] [facility] Smitty Cords, DO   1 year ago Venous stasis dermatitis of left lower extremity   [place] [facility] Smitty Cords, DO   2 years ago Annual physical exam   [place] [facility] Smitty Cords, DO       Future Appointments             In 2 weeks Janet [name], Janet Neat, DO [place] [facility]

## 2022-11-18 ENCOUNTER — Ambulatory Visit (INDEPENDENT_AMBULATORY_CARE_PROVIDER_SITE_OTHER): Payer: 59

## 2022-11-18 ENCOUNTER — Encounter: Payer: 59 | Admitting: Family Medicine

## 2022-11-18 DIAGNOSIS — Z0000 Encounter for general adult medical examination without abnormal findings: Secondary | ICD-10-CM

## 2022-11-18 NOTE — Patient Instructions (Addendum)
Janet [name] , Thank you for taking time to come for your Medicare Wellness Visit. I appreciate your ongoing commitment to your health goals. Please review the following plan we discussed and let me know if I can assist you in the future.   Referrals/Orders/Follow-Ups/Clinician Recommendations: none  This is a list of the screening recommended for you and due dates:  Health Maintenance  Topic Date Due   DTaP/Tdap/Td vaccine (1 - Tdap) Never done   DEXA scan (bone density measurement)  Never done   Eye exam for diabetics  06/12/2020   Colon Cancer Screening  01/17/2022   Hemoglobin A1C  03/10/2022   Complete foot exam   09/11/2022   Flu Shot  09/29/2022   COVID-19 Vaccine (5 - 2023-24 season) 10/30/2022   Hepatitis C Screening  09/17/2023*   Yearly kidney function blood test for diabetes  07/08/2023   Yearly kidney health urinalysis for diabetes  09/16/2023   Mammogram  10/26/2023   Medicare Annual Wellness Visit  11/18/2023   Pneumonia Vaccine  Completed   Zoster (Shingles) Vaccine  Completed   HPV Vaccine  Aged Out  *Topic was postponed. The date shown is not the original due date.    Advanced directives: (ACP Link)Information on Advanced Care Planning can be found at [facility] Directives Advance Health Care Directives ([email or web address]   Next Medicare Annual Wellness Visit scheduled for next year: Yes   11/23/23 @ 2:30 pm by video

## 2022-11-18 NOTE — Progress Notes (Signed)
Subjective:   Janet [name] is a 72 y.o. female who presents for Medicare Annual (Subsequent) preventive examination.  Visit Complete: Virtual  I connected with  Janet [name] on 11/18/22 by a audio enabled telemedicine application and verified that I am speaking with the correct person using two identifiers.  Patient Location: Home  Provider Location: Office/Clinic  I discussed the limitations of evaluation and management by telemedicine. The patient expressed understanding and agreed to proceed.  Vital Signs: Unable to obtain new vitals due to this being a telehealth visit.  Cardiac Risk Factors include: advanced age (>66men, >21 women);dyslipidemia;obesity (BMI >30kg/m2);sedentary lifestyle     Objective:    Today's Vitals   11/18/22 0915  PainSc: 4    There is no height or weight on file to calculate BMI.     11/18/2022    9:19 AM 01/26/2022    7:59 AM 12/24/2021   10:43 AM 11/03/2021   10:30 AM 10/26/2021    1:09 PM 02/26/2021   11:57 AM 10/20/2020    9:06 AM  Advanced Directives  Does Patient Have a Medical Advance Directive? No Yes Yes Yes Yes No Yes  Type of Advance Directive  Living will Living will Living will Living will  Living will  Does patient want to make changes to medical advance directive?  No - Patient declined  No - Patient declined     Would patient like information on creating a medical advance directive? No - Patient declined     No - Patient declined     Current Medications (verified) Outpatient Encounter Medications as of 11/18/2022  Medication Sig   aspirin 81 MG EC tablet Take 81 mg by mouth daily.   atorvastatin (LIPITOR) 40 MG tablet Take 1 tablet (40 mg total) by mouth at bedtime.   DULoxetine (CYMBALTA) 30 MG capsule Take 30 mg by mouth daily.   gabapentin (NEURONTIN) 300 MG capsule Take 300 mg by mouth 2 (two) times daily.   losartan-hydrochlorothiazide (HYZAAR) 100-25 MG tablet Take 1 tablet by mouth daily.   methocarbamol (ROBAXIN) 500 MG  tablet TAKE 1 TABLET(500 MG) BY MOUTH EVERY 6 HOURS AS NEEDED FOR MUSCLE SPASMS   metoprolol tartrate (LOPRESSOR) 25 MG tablet Take 1 tablet (25 mg total) by mouth 2 (two) times daily.   pramipexole (MIRAPEX) 1 MG tablet Take 1 tablet (1 mg total) by mouth 2 (two) times daily.   No facility-administered encounter medications on file as of 11/18/2022.    Allergies (verified) Celebrex [celecoxib]   History: Past Medical History:  Diagnosis Date   Anxiety    a.) on BZO (clonazepam) PRN   Cervical spinal stenosis    Depression    Diastolic dysfunction    a.)  TTE 05/2021: EF 90%, mild LVH, trivial MR/TR/PR, G1DD   Esophageal reflux    Esophageal stricture    a.) s/p multiple (2012, 2015, 2017, 2020) dilitation procedures   GERD (gastroesophageal reflux disease)    Heart murmur    Hyperlipidemia    Hypertension    Insomnia    Osteoarthritis    Osteoporosis    Other secondary kyphosis, cervical region    PAD (peripheral artery disease) (HCC)    PONV (postoperative nausea and vomiting)    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 Jude device   RLS (restless legs syndrome)    a.) takes pramipexole   Sleep apnea    SOB (shortness of breath)    T2DM (type 2 diabetes mellitus) (HCC)  Urinary incontinence    Venous insufficiency of both lower extremities    Past Surgical History:  Procedure Laterality Date   ANTERIOR CERVICAL DECOMP/DISCECTOMY FUSION N/A 11/03/2021   Procedure: C4-7 ANTERIOR CERVICAL DISCECTOMY AND FUSION (GLOBUS FORGE);  Surgeon: [name], MD;  Location: [facility];  Service: Neurosurgery;  Laterality: N/A;   ANTERIOR LATERAL LUMBAR FUSION WITH PERCUTANEOUS SCREW 2 LEVEL N/A 01/26/2022   Procedure: L3-5 LATERAL LUMBAR INTERBODY FUSION WITH POSTERIOR SPINAL FUSION;  Surgeon: [name], MD;  Location: [facility];  Service: Neurosurgery;  Laterality: N/A;   APPENDECTOMY  2014   APPLICATION OF INTRAOPERATIVE CT SCAN N/A 01/26/2022    Procedure: APPLICATION OF INTRAOPERATIVE CT SCAN;  Surgeon: [name], MD;  Location: [facility];  Service: Neurosurgery;  Laterality: N/A;   CARPAL TUNNEL RELEASE Left 1998   ESOPHAGEAL DILATION  2012   2015, 2017, 2020   GASTRIC BYPASS  2019   HERNIA REPAIR  2019   left knee manipulation Left 02/05/2019   PACEMAKER IMPLANT  05/2015   Procedure: PACEMAKER INSERTION; Location: [place]; Surgeon: Maryjean Ka, MD   POLYPECTOMY     SHOULDER SURGERY Left 2014   SHOULDER SURGERY Right 2017   TOTAL KNEE ARTHROPLASTY Right 2018   TOTAL KNEE ARTHROPLASTY Left 10/18/2018   vein removal Bilateral 2012   Family History  Problem Relation Age of Onset   Heart attack Mother 39   Heart disease Father    Thyroid disease Father    Skin cancer Father    Brain cancer Maternal Grandmother    Social History   Socioeconomic History   Marital status: Legally Separated    Spouse name: Sehaj Heart   Number of children: 6   Years of education: Not on file   Highest education level: Not on file  Occupational History   Occupation: Homemaker  Tobacco Use   Smoking status: Former    Current packs/day: 0.00    Average packs/day: 1 pack/day for 25.0 years (25.0 ttl pk-yrs)    Types: Cigarettes    Start date: 53    Quit date: 2007    Years since quitting: 17.7    Passive exposure: Never   Smokeless tobacco: Former  Building services engineer status: Never Used  Substance and Sexual Activity   Alcohol use: Yes    Comment: very rarely   Drug use: Never   Sexual activity: Not Currently  Other Topics Concern   Not on file  Social History Narrative   Not on file   Social Determinants of Health   Financial Resource Strain: Low Risk  (11/18/2022)   Overall Financial Resource Strain (CARDIA)    Difficulty of Paying Living Expenses: Not very hard  Food Insecurity: No Food Insecurity (11/18/2022)   Hunger Vital Sign    Worried About Running Out of Food in the Last Year: Never true    Ran Out of  Food in the Last Year: Never true  Transportation Needs: No Transportation Needs (11/18/2022)   PRAPARE - Administrator, Civil Service (Medical): No    Lack of Transportation (Non-Medical): No  Physical Activity: Inactive (11/18/2022)   Exercise Vital Sign    Days of Exercise per Week: 0 days    Minutes of Exercise per Session: 0 min  Stress: No Stress Concern Present (11/18/2022)   [employer] of Occupational Health - Occupational Stress Questionnaire    Feeling of Stress : Only a little  Social Connections: Moderately Isolated (11/18/2022)   Social  Connection and Isolation Panel [NHANES]    Frequency of Communication with Friends and Family: Twice a week    Frequency of Social Gatherings with Friends and Family: Once a week    Attends Religious Services: More than 4 times per year    Active Member of [employer] or Organizations: No    Attends Engineer, structural: Never    Marital Status: Divorced    Tobacco Counseling Counseling given: Not Answered   Clinical Intake:  Pre-visit preparation completed: Yes  Pain : 0-10 Pain Score: 4  Pain Type: Chronic pain Pain Location: Leg Pain Orientation: Right, Left     Nutritional Status: BMI > 30  Obese Nutritional Risks: None Diabetes: No  How often do you need to have someone help you when you read instructions, pamphlets, or other written materials from your doctor or pharmacy?: 1 - Never  Interpreter Needed?: No  Information entered by :: Kennedy Bucker, LPN   Activities of Daily Living    11/18/2022    9:21 AM 07/01/2022    8:59 AM  In your present state of health, do you have any difficulty performing the following activities:  Hearing? 0 0  Vision? 0 0  Difficulty concentrating or making decisions? 0 0  Walking or climbing stairs? 1 1  Dressing or bathing? 0 0  Doing errands, shopping? 0 0  Preparing Food and eating ? N   Using the Toilet? N   In the past six months, have you accidently  leaked urine? N   Do you have problems with loss of bowel control? N   Managing your Medications? N   Managing your Finances? N   Housekeeping or managing your Housekeeping? N     Patient Care Team: Smitty Cords, DO as PCP - General (Family Medicine) [name], [name], RPH-CPP (Pharmacist)  Indicate any recent Medical Services you may have received from other than [facility] providers in the past year (date may be approximate).     Assessment:   This is a routine wellness examination for Arienna.  Hearing/Vision screen Hearing Screening - Comments:: No aids Vision Screening - Comments:: Wears glasses- Dr.Woodard   Goals Addressed             This Visit's Progress    DIET - EAT MORE FRUITS AND VEGETABLES         Depression Screen    11/18/2022    9:18 AM 07/01/2022    8:59 AM 09/10/2021   11:32 PM 03/08/2021   10:56 AM 10/20/2020    9:08 AM 09/16/2020    9:38 AM 03/03/2020    8:59 AM  PHQ 2/9 Scores  PHQ - 2 Score 0 0 1 2 4 2  0  PHQ- 9 Score 0  2 7 10 7  0    Fall Risk    11/18/2022    9:20 AM 07/01/2022    8:59 AM 11/08/2021    2:31 PM 03/08/2021   10:56 AM 10/20/2020    9:07 AM  Fall Risk   Falls in the past year?  1 1 1 1   Comment     lost balance, trips  Number falls in past yr: 1 1 0 1 1  Injury with Fall? 0 0 0 0 0  Risk for fall due to : History of fall(s)  Other (Comment) History of fall(s) History of fall(s);Impaired balance/gait;Medication side effect  Risk for fall due to: Comment   tripped over something    Follow up Falls evaluation  completed;Falls prevention discussed  Falls evaluation completed Falls evaluation completed Falls evaluation completed;Education provided;Falls prevention discussed    MEDICARE RISK AT HOME:    TIMED UP AND GO:  Was the test performed?  No    Cognitive Function:        11/18/2022    9:22 AM 11/08/2021    2:36 PM 10/20/2020    9:20 AM  6CIT Screen  What Year? 0 points 0 points 0 points  What month? 0 points 0  points 0 points  What time? 0 points 0 points 0 points  Count back from 20 0 points 0 points 0 points  Months in reverse 0 points 0 points 0 points  Repeat phrase 0 points 2 points 4 points  Total Score 0 points 2 points 4 points    Immunizations Immunization History  Administered Date(s) Administered   Fluad Quad(high Dose 65+) 03/03/2020   Influenza, High Dose Seasonal PF 01/09/2018, 11/21/2018, 11/21/2018, 12/02/2021   Influenza, Seasonal, Injecte, Preservative Fre 12/16/2014   Moderna Sars-Covid-2 Vaccination 03/27/2019, 04/26/2019   PFIZER(Purple Top)SARS-COV-2 Vaccination 01/09/2020, 07/08/2020   Pneumococcal Conjugate-13 01/07/2018   Pneumococcal Polysaccharide-23 09/19/2019   Zoster Recombinant(Shingrix) 07/09/2020, 09/21/2020    TDAP status: Due, Education has been provided regarding the importance of this vaccine. Advised may receive this vaccine at local pharmacy or Health Dept. Aware to provide a copy of the vaccination record if obtained from local pharmacy or Health Dept. Verbalized acceptance and understanding.  Flu Vaccine status: Due, Education has been provided regarding the importance of this vaccine. Advised may receive this vaccine at local pharmacy or Health Dept. Aware to provide a copy of the vaccination record if obtained from local pharmacy or Health Dept. Verbalized acceptance and understanding.  Pneumococcal vaccine status: Up to date  Covid-19 vaccine status: Completed vaccines  Qualifies for Shingles Vaccine? Yes   Zostavax completed No   Shingrix Completed?: Yes  Screening Tests Health Maintenance  Topic Date Due   DTaP/Tdap/Td (1 - Tdap) Never done   DEXA SCAN  Never done   OPHTHALMOLOGY EXAM  06/12/2020   Colonoscopy  01/17/2022   HEMOGLOBIN A1C  03/10/2022   FOOT EXAM  09/11/2022   INFLUENZA VACCINE  09/29/2022   COVID-19 Vaccine (5 - 2023-24 season) 10/30/2022   Hepatitis C Screening  09/17/2023 (Originally 01/08/1969)   Diabetic kidney  evaluation - eGFR measurement  07/08/2023   Diabetic kidney evaluation - Urine ACR  09/16/2023   MAMMOGRAM  10/26/2023   Medicare Annual Wellness (AWV)  11/18/2023   Pneumonia Vaccine 79+ Years old  Completed   Zoster Vaccines- Shingrix  Completed   HPV VACCINES  Aged Out    Health Maintenance  Health Maintenance Due  Topic Date Due   DTaP/Tdap/Td (1 - Tdap) Never done   DEXA SCAN  Never done   OPHTHALMOLOGY EXAM  06/12/2020   Colonoscopy  01/17/2022   HEMOGLOBIN A1C  03/10/2022   FOOT EXAM  09/11/2022   INFLUENZA VACCINE  09/29/2022   COVID-19 Vaccine (5 - 2023-24 season) 10/30/2022    Colorectal cancer screening: Type of screening: Colonoscopy. Completed 01/17/17. Repeat every 5 years- declined referral  Mammogram status: Completed 10/25/21. Repeat every year- declined referral    Lung Cancer Screening: (Low Dose CT Chest recommended if Age 85-80 years, 20 pack-year currently smoking OR have quit w/in 15years.) does not qualify.    Additional Screening:  Hepatitis C Screening: does qualify; Completed no  Vision Screening: Recommended annual ophthalmology exams for early detection of  glaucoma and other disorders of the eye. Is the patient up to date with their annual eye exam?  Yes  Who is the provider or what is the name of the office in which the patient attends annual eye exams? Dr.Woodard If pt is not established with a provider, would they like to be referred to a provider to establish care? No .   Dental Screening: Recommended annual dental exams for proper oral hygiene    Community Resource Referral / Chronic Care Management: CRR required this visit?  No   CCM required this visit?  No     Plan:     I have personally reviewed and noted the following in the patient's chart:   Medical and social history Use of alcohol, tobacco or illicit drugs  Current medications and supplements including opioid prescriptions. Patient is not currently taking opioid  prescriptions. Functional ability and status Nutritional status Physical activity Advanced directives List of other physicians Hospitalizations, surgeries, and ER visits in previous 12 months Vitals Screenings to include cognitive, depression, and falls Referrals and appointments  In addition, I have reviewed and discussed with patient certain preventive protocols, quality metrics, and best practice recommendations. A written personalized care plan for preventive services as well as general preventive health recommendations were provided to patient.     Hal Hope, LPN   [id number]   After Visit Summary: (MyChart) Due to this being a telephonic visit, the after visit summary with patients personalized plan was offered to patient via MyChart   Nurse Notes: none

## 2022-11-23 DIAGNOSIS — R200 Anesthesia of skin: Secondary | ICD-10-CM | POA: Diagnosis not present

## 2022-11-23 DIAGNOSIS — G959 Disease of spinal cord, unspecified: Secondary | ICD-10-CM | POA: Diagnosis not present

## 2022-11-23 DIAGNOSIS — R29898 Other symptoms and signs involving the musculoskeletal system: Secondary | ICD-10-CM | POA: Diagnosis not present

## 2022-11-23 DIAGNOSIS — G2581 Restless legs syndrome: Secondary | ICD-10-CM | POA: Diagnosis not present

## 2022-11-23 DIAGNOSIS — M79601 Pain in right arm: Secondary | ICD-10-CM | POA: Diagnosis not present

## 2022-11-23 DIAGNOSIS — M79641 Pain in right hand: Secondary | ICD-10-CM | POA: Diagnosis not present

## 2022-11-23 DIAGNOSIS — R600 Localized edema: Secondary | ICD-10-CM | POA: Diagnosis not present

## 2022-11-23 DIAGNOSIS — M79604 Pain in right leg: Secondary | ICD-10-CM | POA: Diagnosis not present

## 2022-11-23 DIAGNOSIS — G629 Polyneuropathy, unspecified: Secondary | ICD-10-CM | POA: Diagnosis not present

## 2022-11-23 DIAGNOSIS — Z950 Presence of cardiac pacemaker: Secondary | ICD-10-CM | POA: Diagnosis not present

## 2022-11-23 DIAGNOSIS — G4700 Insomnia, unspecified: Secondary | ICD-10-CM | POA: Diagnosis not present

## 2022-11-23 DIAGNOSIS — M62838 Other muscle spasm: Secondary | ICD-10-CM | POA: Diagnosis not present

## 2022-11-24 ENCOUNTER — Other Ambulatory Visit: Payer: Self-pay | Admitting: Neurology

## 2022-11-24 DIAGNOSIS — I8290 Acute embolism and thrombosis of unspecified vein: Secondary | ICD-10-CM

## 2022-11-24 DIAGNOSIS — M79604 Pain in right leg: Secondary | ICD-10-CM

## 2022-11-28 ENCOUNTER — Ambulatory Visit
Admission: RE | Admit: 2022-11-28 | Discharge: 2022-11-28 | Disposition: A | Discharge status: Home / Self Care | Payer: 59 | Source: Ambulatory Visit | Attending: Neurology | Admitting: Neurology

## 2022-11-28 DIAGNOSIS — R600 Localized edema: Secondary | ICD-10-CM | POA: Diagnosis not present

## 2022-11-28 DIAGNOSIS — I8290 Acute embolism and thrombosis of unspecified vein: Secondary | ICD-10-CM | POA: Diagnosis not present

## 2022-11-28 DIAGNOSIS — M79604 Pain in right leg: Secondary | ICD-10-CM | POA: Insufficient documentation

## 2022-12-02 ENCOUNTER — Encounter: Payer: Self-pay | Admitting: Family Medicine

## 2022-12-02 ENCOUNTER — Ambulatory Visit (INDEPENDENT_AMBULATORY_CARE_PROVIDER_SITE_OTHER): Payer: 59 | Admitting: Family Medicine

## 2022-12-02 VITALS — BP 122/68 | HR 80 | Ht 60.25 in | Wt 219.0 lb

## 2022-12-02 DIAGNOSIS — Z1211 Encounter for screening for malignant neoplasm of colon: Secondary | ICD-10-CM | POA: Diagnosis not present

## 2022-12-02 DIAGNOSIS — G720 Drug-induced myopathy: Secondary | ICD-10-CM | POA: Diagnosis not present

## 2022-12-02 DIAGNOSIS — E538 Deficiency of other specified B group vitamins: Secondary | ICD-10-CM | POA: Diagnosis not present

## 2022-12-02 DIAGNOSIS — I700 Atherosclerosis of aorta: Secondary | ICD-10-CM | POA: Diagnosis not present

## 2022-12-02 DIAGNOSIS — E1169 Type 2 diabetes mellitus with other specified complication: Secondary | ICD-10-CM | POA: Diagnosis not present

## 2022-12-02 DIAGNOSIS — M79674 Pain in right toe(s): Secondary | ICD-10-CM

## 2022-12-02 DIAGNOSIS — E1142 Type 2 diabetes mellitus with diabetic polyneuropathy: Secondary | ICD-10-CM

## 2022-12-02 DIAGNOSIS — I10 Essential (primary) hypertension: Secondary | ICD-10-CM | POA: Diagnosis not present

## 2022-12-02 DIAGNOSIS — I6522 Occlusion and stenosis of left carotid artery: Secondary | ICD-10-CM | POA: Diagnosis not present

## 2022-12-02 DIAGNOSIS — Z23 Encounter for immunization: Secondary | ICD-10-CM | POA: Diagnosis not present

## 2022-12-02 DIAGNOSIS — Z0000 Encounter for general adult medical examination without abnormal findings: Secondary | ICD-10-CM

## 2022-12-02 DIAGNOSIS — E785 Hyperlipidemia, unspecified: Secondary | ICD-10-CM | POA: Diagnosis not present

## 2022-12-02 DIAGNOSIS — Z780 Asymptomatic menopausal state: Secondary | ICD-10-CM

## 2022-12-02 MED ORDER — REPATHA SURECLICK 140 MG/ML ~~LOC~~ SOAJ
140.0000 mg | SUBCUTANEOUS | 2 refills | Status: DC
Start: 2022-12-02 — End: 2023-02-09

## 2022-12-02 MED ORDER — VALSARTAN 80 MG PO TABS
80.0000 mg | ORAL_TABLET | Freq: Every day | ORAL | 3 refills | Status: DC
Start: 2022-12-02 — End: 2023-03-07

## 2022-12-02 NOTE — Progress Notes (Signed)
Subjective:    Patient ID: Janet [name], female    DOB: 04/13/1950, 72 y.o.   MRN: [id number]  Janet [name] is a 72 y.o. female presenting on 12/02/2022 for Annual Exam   HPI  Discussed the use of AI scribe software for clinical note transcription with the patient, who gave verbal consent to proceed.  Here for Annual Physical and Lab Orders.  right big toe, which has been causing significant discomfort for the past month. The pain is constant and severe enough to affect the patient's mobility. The patient also reported a decrease in sensation in both feet, describing them as numb and painful.  The patient has a history of diabetes and has been experiencing these symptoms for the past three months. They reported discontinuing most of their medications, including blood pressure and cholesterol medications, due to stomach discomfort and a general feeling of being overwhelmed by the number of medications. The patient has been taking Mirapex for restless legs syndrome and gabapentin for nerve pain, which was recently increased to 400mg  twice daily. They also take tramadol and methocarbamol for pain management.     CHRONIC DM, Type 2: Reports no concerns since bariatric surgery wt loss, major improvement A1c last resulted 5.9, off of medication Meds: None (no longer on Metformin) Reports good compliance. Tolerating well w/o side-effects Currently on ARB Lifestyle: - Diet (improved)  Upcoming Diabetic Eye Exam Dr Clydene Pugh next week Denies hypoglycemia, polyuria, visual changes, numbness.  Right Great Toe Pain No recent X-ray 1 month Not hurting with ambulation but hurts constantly  She is taking Mirapex for RLS and gabapentin  History of back pain prior surgery Still has some numbness into Left thigh  New change Gabapentin 400mg  in AM and 400mg  mid day and 200mg  PM Muscle relaxant methocarbamol Tramadol PRN    HYPERLIPIDEMIA: - Reports no concerns. Last lipid panel 08/2021,  controlled on statin - Currently taking Atorvastatin 40mg , tolerating well without side effects or myalgias   Chronic Low Back Pain Chronic Osteoarthritis, Lumbar DDD / Sciatica Left Leg Pain and Numbness / Possible peripheral neuropathy Chronic L knee pain, history of osteoarthritis, s/p prior L knee TKR surgery and repeat procedure - Prior R knee replacement in past with good results in [facility] - August 2020, left knee replacement, they did repeat knee procedure manipulation 01/2019 - Did PT, still has pain and swelling of left lower extremity  Completed PT and still continuing, with limited relief. History of L thigh pain numbness Some improved on Duloxetine. Takes Baclofen PRN muscle spasm.   Varicose veins Venous Insufficiency   History of Bariatric Surgery - Gastric Bypass - She has history of type 2 diabetes. Her A1c has been controlled after surgery    Restless Leg Syndrome / Insomnia Major Depression recurrent in remission   CHRONIC HTN / s/p Pacemaker Reports home BP readings 120s/60-70s. Checks it at home occasionally Followed by [facility] for BP and Pacemaker. Current Meds - Losartan-HCTZ 100-25mg  daily, Metoprolol tartrate 25mg  nightly Reports good compliance, took meds today. Tolerating well, w/o complaints. Denies CP, dyspnea, HA, edema, dizziness / lightheadedness   Health Maintenance: Colonoscopy last done 2018 [facility] 2 polyps, repeat 5 yr, now due this year. Will refer.     12/02/2022   10:27 AM 11/18/2022    9:18 AM 07/01/2022    8:59 AM  Depression screen PHQ 2/9  Decreased Interest 0 0 0  Down, Depressed, Hopeless 1 0 0  PHQ - 2 Score 1 0 0  Altered sleeping 3 0   Tired, decreased energy 1 0   Change in appetite 0 0   Feeling bad or failure about yourself  1 0   Trouble concentrating 1 0   Moving slowly or fidgety/restless 0 0   Suicidal thoughts 1 0   PHQ-9 Score 8 0   Difficult doing work/chores Somewhat difficult Not  difficult at all    [place]-Suicide Severity Rating Scale 1) Have you wished you were dead or wished you could go to sleep and not wake up? - Yes  2) Have you had any actual thoughts of killing yourself? - No  Skip questions 3,4, 5  6) Have you ever done anything, started to do anything, or prepared to do anything to end your life? - No      12/02/2022   10:28 AM 07/01/2022    8:59 AM 03/08/2021   10:56 AM 09/16/2020    9:38 AM  GAD 7 : Generalized Anxiety Score  Nervous, Anxious, on Edge 1 0 0 1  Control/stop worrying 1 0 0 0  Worry too much - different things 1 0 0 0  Trouble relaxing 1 1 1 1   Restless 1 0 0 1  Easily annoyed or irritable 0 0 0 0  Afraid - awful might happen 0 0 0 0  Total GAD 7 Score 5 1 1 3   Anxiety Difficulty   Not difficult at all Not difficult at all      Past Medical History:  Diagnosis Date   Anxiety    a.) on BZO (clonazepam) PRN   Cervical spinal stenosis    Depression    Diastolic dysfunction    a.)  TTE 05/2021: EF 90%, mild LVH, trivial MR/TR/PR, G1DD   Esophageal reflux    Esophageal stricture    a.) s/p multiple (2012, 2015, 2017, 2020) dilitation procedures   GERD (gastroesophageal reflux disease)    Heart murmur    Hyperlipidemia    Hypertension    Insomnia    Osteoarthritis    Osteoporosis    Other secondary kyphosis, cervical region    PAD (peripheral artery disease) (HCC)    PONV (postoperative nausea and vomiting)    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 Jude device   RLS (restless legs syndrome)    a.) takes pramipexole   Sleep apnea    SOB (shortness of breath)    T2DM (type 2 diabetes mellitus) (HCC)    Urinary incontinence    Venous insufficiency of both lower extremities    Past Surgical History:  Procedure Laterality Date   ANTERIOR CERVICAL DECOMP/DISCECTOMY FUSION N/A 11/03/2021   Procedure: C4-7 ANTERIOR CERVICAL DISCECTOMY AND FUSION (GLOBUS FORGE);  Surgeon: [name], MD;   Location: [facility];  Service: Neurosurgery;  Laterality: N/A;   ANTERIOR LATERAL LUMBAR FUSION WITH PERCUTANEOUS SCREW 2 LEVEL N/A 01/26/2022   Procedure: L3-5 LATERAL LUMBAR INTERBODY FUSION WITH POSTERIOR SPINAL FUSION;  Surgeon: [name], MD;  Location: [facility];  Service: Neurosurgery;  Laterality: N/A;   APPENDECTOMY  2014   APPLICATION OF INTRAOPERATIVE CT SCAN N/A 01/26/2022   Procedure: APPLICATION OF INTRAOPERATIVE CT SCAN;  Surgeon: [name], MD;  Location: [facility];  Service: Neurosurgery;  Laterality: N/A;   CARPAL TUNNEL RELEASE Left 1998   ESOPHAGEAL DILATION  2012   2015, 2017, 2020   GASTRIC BYPASS  2019   HERNIA REPAIR  2019   left knee manipulation Left 02/05/2019   PACEMAKER IMPLANT  05/2015   Procedure: PACEMAKER INSERTION; Location: [place]; Surgeon: Maryjean Ka, MD   POLYPECTOMY     SHOULDER SURGERY Left 2014   SHOULDER SURGERY Right 2017   TOTAL KNEE ARTHROPLASTY Right 2018   TOTAL KNEE ARTHROPLASTY Left 10/18/2018   vein removal Bilateral 2012   Social History   Socioeconomic History   Marital status: Legally Separated    Spouse name: Jesus Ivan   Number of children: 6   Years of education: Not on file   Highest education level: Not on file  Occupational History   Occupation: Homemaker  Tobacco Use   Smoking status: Former    Current packs/day: 0.00    Average packs/day: 1 pack/day for 25.0 years (25.0 ttl pk-yrs)    Types: Cigarettes    Start date: 71    Quit date: 2007    Years since quitting: 17.7    Passive exposure: Never   Smokeless tobacco: Former  Building services engineer status: Never Used  Substance and Sexual Activity   Alcohol use: Yes    Comment: very rarely   Drug use: Never   Sexual activity: Not Currently  Other Topics Concern   Not on file  Social History Narrative   Not on file   Social Determinants of Health   Financial Resource Strain: Low Risk  (11/18/2022)   Overall Financial Resource Strain  (CARDIA)    Difficulty of Paying Living Expenses: Not very hard  Food Insecurity: No Food Insecurity (11/18/2022)   Hunger Vital Sign    Worried About Running Out of Food in the Last Year: Never true    Ran Out of Food in the Last Year: Never true  Transportation Needs: No Transportation Needs (11/18/2022)   PRAPARE - Administrator, Civil Service (Medical): No    Lack of Transportation (Non-Medical): No  Physical Activity: Inactive (11/18/2022)   Exercise Vital Sign    Days of Exercise per Week: 0 days    Minutes of Exercise per Session: 0 min  Stress: No Stress Concern Present (11/18/2022)   [employer] of Occupational Health - Occupational Stress Questionnaire    Feeling of Stress : Only a little  Social Connections: Moderately Isolated (11/18/2022)   Social Connection and Isolation Panel [NHANES]    Frequency of Communication with Friends and Family: Twice a week    Frequency of Social Gatherings with Friends and Family: Once a week    Attends Religious Services: More than 4 times per year    Active Member of [employer] or Organizations: No    Attends Banker Meetings: Never    Marital Status: Divorced  Catering manager Violence: Not At Risk (11/18/2022)   Humiliation, Afraid, Rape, and Kick questionnaire    Fear of Current or Ex-Partner: No    Emotionally Abused: No    Physically Abused: No    Sexually Abused: No   Family History  Problem Relation Age of Onset   Heart attack Mother 53   Heart disease Father    Thyroid disease Father    Skin cancer Father    Brain cancer Maternal Grandmother    Current Outpatient Medications on File Prior to Visit  Medication Sig   aspirin 81 MG EC tablet Take 81 mg by mouth daily.   gabapentin (NEURONTIN) 300 MG capsule Take 300 mg by mouth 2 (two) times daily.   methocarbamol (ROBAXIN) 500 MG tablet TAKE 1 TABLET(500 MG) BY MOUTH EVERY 6 HOURS AS NEEDED FOR MUSCLE SPASMS  metoprolol tartrate (LOPRESSOR) 25 MG  tablet Take 1 tablet (25 mg total) by mouth 2 (two) times daily.   pramipexole (MIRAPEX) 1 MG tablet Take 1 tablet (1 mg total) by mouth 2 (two) times daily.   traMADol (ULTRAM) 50 MG tablet Take 50 mg by mouth. 2-3 times daily PRN   No current facility-administered medications on file prior to visit.    Review of Systems  Constitutional:  Negative for activity change, appetite change, chills, diaphoresis, fatigue and fever.  HENT:  Negative for congestion and hearing loss.   Eyes:  Negative for visual disturbance.  Respiratory:  Negative for cough, chest tightness, shortness of breath and wheezing.   Cardiovascular:  Negative for chest pain, palpitations and leg swelling.  Gastrointestinal:  Negative for abdominal pain, constipation, diarrhea, nausea and vomiting.  Genitourinary:  Negative for dysuria, frequency and hematuria.  Musculoskeletal:  Positive for arthralgias and back pain. Negative for neck pain.  Skin:  Negative for rash.  Neurological:  Negative for dizziness, weakness, light-headedness, numbness and headaches.  Hematological:  Negative for adenopathy.  Psychiatric/Behavioral:  Negative for behavioral problems, dysphoric mood and sleep disturbance.    Per HPI unless specifically indicated above     Objective:    BP 122/68   Pulse 80   Ht 5' 0.25" (1.53 m)   Wt 219 lb (99.3 kg)   SpO2 98%   BMI 42.42 kg/m   Wt Readings from Last 3 Encounters:  12/02/22 219 lb (99.3 kg)  10/25/22 218 lb (98.9 kg)  07/08/22 220 lb (99.8 kg)    Physical Exam Vitals and nursing note reviewed.  Constitutional:      General: She is not in acute distress.    Appearance: She is well-developed. She is not diaphoretic.     Comments: Well-appearing, comfortable, cooperative  HENT:     Head: Normocephalic and atraumatic.  Eyes:     General:        Right eye: No discharge.        Left eye: No discharge.     Conjunctiva/sclera: Conjunctivae normal.     Pupils: Pupils are equal,  round, and reactive to light.  Neck:     Thyroid: No thyromegaly.     Vascular: No carotid bruit.  Cardiovascular:     Rate and Rhythm: Normal rate and regular rhythm.     Pulses: Normal pulses.     Heart sounds: Normal heart sounds. No murmur heard. Pulmonary:     Effort: Pulmonary effort is normal. No respiratory distress.     Breath sounds: Normal breath sounds. No wheezing or rales.  Abdominal:     General: Bowel sounds are normal. There is no distension.     Palpations: Abdomen is soft. There is no mass.     Tenderness: There is no abdominal tenderness.  Musculoskeletal:        General: No tenderness. Normal range of motion.     Cervical back: Normal range of motion and neck supple.     Right lower leg: No edema.     Left lower leg: No edema.     Comments: Upper / Lower Extremities: - Normal muscle tone, strength bilateral upper extremities 5/5, lower extremities 5/5  Lymphadenopathy:     Cervical: No cervical adenopathy.  Skin:    General: Skin is warm and dry.     Findings: No erythema or rash.  Neurological:     Mental Status: She is alert and oriented to person, place, and time.  Comments: Distal sensation intact to light touch all extremities  Psychiatric:        Mood and Affect: Mood normal.        Behavior: Behavior normal.        Thought Content: Thought content normal.     Comments: Well groomed, good eye contact, normal speech and thoughts     Diabetic Foot Exam - Simple   Simple Foot Form Diabetic Foot exam was performed with the following findings: Yes 12/02/2022 10:48 AM  Visual Inspection See comments: Yes Sensation Testing See comments: Yes Pulse Check Posterior Tibialis and Dorsalis pulse intact bilaterally: Yes Comments Bilateral feet with reduced monofilament sensation heel and forefoot great toe R>L, has some varicose veins as well. Mild callus formation R great toe and heels       Results for orders placed or performed in visit on  10/06/22  Microalbumin / creatinine urine ratio  Result Value Ref Range   Microalb Creat Ratio 29.999       Assessment & Plan:   Problem List Items Addressed This Visit     Atherosclerosis of left carotid artery   Relevant Medications   valsartan (DIOVAN) 80 MG tablet   REPATHA SURECLICK 140 MG/ML SOAJ   Drug-induced myopathy   Other Visit Diagnoses     Annual physical exam    -  Primary   Colon cancer screening       Relevant Orders   Ambulatory referral to Gastroenterology   Need for influenza vaccination       Relevant Orders   Flu Vaccine Trivalent High Dose (Fluad)   Postmenopausal estrogen deficiency       Relevant Orders   DG Bone Density   Great toe pain, right       Relevant Orders   DG Foot Complete Right   Diabetic peripheral neuropathy (HCC)       Relevant Medications   valsartan (DIOVAN) 80 MG tablet   Essential hypertension       Relevant Medications   valsartan (DIOVAN) 80 MG tablet   REPATHA SURECLICK 140 MG/ML SOAJ   Aortic atherosclerosis (HCC)       Relevant Medications   valsartan (DIOVAN) 80 MG tablet   REPATHA SURECLICK 140 MG/ML SOAJ       Updated Health Maintenance information Labs ordered for today Encouraged improvement to lifestyle with diet and exercise Goal of weight loss  Assessment and Plan    Colon Cancer Screening Last colonoscopy in 2018 with two polyps found. Discussed options of repeat colonoscopy vs Cologuard test. Patient prefers direct colonoscopy. -Refer to GI for colonoscopy screening.  Foot Pain Patient reports significant pain in the right big toe, with decreased sensation in the feet. Possible gout or arthritis. -Order x-ray of the foot. -Check uric acid level to evaluate for gout. -Consider referral to podiatry depending on x-ray results.  Hyperlipidemia Patient has discontinued atorvastatin due to drug induced myopathy  Discussed alternative cholesterol management with Repatha injections. -Discontinue  atorvastatin. -Start Repatha injections every two weeks, pending insurance approval.  Hypertension Patient has discontinued losartan, but blood pressure remains well-controlled. -Discontinue losartan. -Start valsartan 80mg  daily.  General Health Maintenance -Administer influenza vaccine today. -Order bone density scan. -Order labs today. -Schedule follow-up in 3-4 months to assess efficacy of new cholesterol medication.        Orders Placed This Encounter  Procedures   DG Bone Density    Standing Status:   Future    Standing Expiration Date:  12/02/2023    Order Specific Question:   Reason for Exam (SYMPTOM  OR DIAGNOSIS REQUIRED)    Answer:   screen osteoporosis with postmenopausal estrogen deficiency    Order Specific Question:   Preferred imaging location?    Answer:   [facility]   DG Foot Complete Right    Standing Status:   Future    Standing Expiration Date:   12/02/2023    Order Specific Question:   Reason for Exam (SYMPTOM  OR DIAGNOSIS REQUIRED)    Answer:   Right great toe pain at MTP joint, has peripheral neuropathy and arthritis concerns    Order Specific Question:   Preferred imaging location?    Answer:   [facility]   Flu Vaccine Trivalent High Dose (Fluad)   Hemoglobin A1c   Lipid panel    Order Specific Question:   Has the patient fasted?    Answer:   Yes   COMPLETE METABOLIC PANEL WITH GFR   CBC with Differential/Platelet   TSH   Vitamin B12   Ambulatory referral to Gastroenterology    Referral Priority:   Routine    Referral Type:   Consultation    Referral Reason:   Specialty Services Required    Number of Visits Requested:   1      Meds ordered this encounter  Medications   valsartan (DIOVAN) 80 MG tablet    Sig: Take 1 tablet (80 mg total) by mouth daily.    Dispense:  90 tablet    Refill:  3    Discontinue Losartan-hydrochlorothiazide 100-25mg .   REPATHA SURECLICK 140 MG/ML SOAJ    Sig: Inject 140 mg into the skin every 14  (fourteen) days.    Dispense:  2 mL    Refill:  2      Follow up plan: Return in about 3 months (around 03/04/2023) for 3 month follow-up DM HLD, labs AFTER.  Saralyn Pilar, DO [facility] 12/02/2022, 10:30 AM

## 2022-12-02 NOTE — Patient Instructions (Addendum)
Thank you for coming to the office today.  Stop Losartan-hydrochlorothiazide and start Valsartan 80mg  daily for BP  Alpha-lipoic acid (ALA) 600mg  dose, OTC vitamin natural to help nerve pain, take up to 2 to 3 times a day to see if it helps  X-ray for Right foot next week, Mon-Thurs, not Friday. 8am-4pm. Walk in.  We can consider Podiatry referral after X-ray if needed.  Flu Shot  Lab today   Referral for Colonoscopy  [facility] [address] [place], [place] [address] Hours: 8AM-5PM Phone: [phone]  Stop Atorvastatin Switch to Repatha injection every 2 weeks.  Please schedule a Follow-up Appointment to: No follow-ups on file.  If you have any other questions or concerns, please feel free to call the office or send a message through MyChart. You may also schedule an earlier appointment if necessary.  Additionally, you may be receiving a survey about your experience at our office within a few days to 1 week by e-mail or mail. We value your feedback.  Saralyn Pilar, DO [facility], [place]

## 2022-12-03 LAB — CBC WITH DIFFERENTIAL/PLATELET
Absolute Monocytes: 660 {cells}/uL (ref 200–950)
Basophils Absolute: 21 {cells}/uL (ref 0–200)
Basophils Relative: 0.3 %
Eosinophils Absolute: 277 {cells}/uL (ref 15–500)
Eosinophils Relative: 3.9 %
HCT: 39.9 % (ref 35.0–45.0)
Hemoglobin: 12 g/dL (ref 11.7–15.5)
Lymphs Abs: 1974 {cells}/uL (ref 850–3900)
MCH: 26.8 pg — ABNORMAL LOW (ref 27.0–33.0)
MCHC: 30.1 g/dL — ABNORMAL LOW (ref 32.0–36.0)
MCV: 89.1 fL (ref 80.0–100.0)
MPV: 11.5 fL (ref 7.5–12.5)
Monocytes Relative: 9.3 %
Neutro Abs: 4168 {cells}/uL (ref 1500–7800)
Neutrophils Relative %: 58.7 %
Platelets: 285 10*3/uL (ref 140–400)
RBC: 4.48 10*6/uL (ref 3.80–5.10)
RDW: 14.5 % (ref 11.0–15.0)
Total Lymphocyte: 27.8 %
WBC: 7.1 10*3/uL (ref 3.8–10.8)

## 2022-12-03 LAB — COMPLETE METABOLIC PANEL WITH GFR
AG Ratio: 1.3 (calc) (ref 1.0–2.5)
ALT: 11 U/L (ref 6–29)
AST: 14 U/L (ref 10–35)
Albumin: 3.9 g/dL (ref 3.6–5.1)
Alkaline phosphatase (APISO): 117 U/L (ref 37–153)
BUN: 13 mg/dL (ref 7–25)
CO2: 30 mmol/L (ref 20–32)
Calcium: 9.2 mg/dL (ref 8.6–10.4)
Chloride: 105 mmol/L (ref 98–110)
Creat: 0.64 mg/dL (ref 0.50–1.05)
Globulin: 3 g/dL (ref 1.9–3.7)
Glucose, Bld: 109 mg/dL (ref 65–139)
Potassium: 4.5 mmol/L (ref 3.5–5.3)
Sodium: 141 mmol/L (ref 135–146)
Total Bilirubin: 0.8 mg/dL (ref 0.2–1.2)
Total Protein: 6.9 g/dL (ref 6.1–8.1)
eGFR: 96 mL/min/{1.73_m2} (ref >=60)

## 2022-12-03 LAB — LIPID PANEL
Cholesterol: 170 mg/dL (ref <200)
HDL: 56 mg/dL (ref >=50)
LDL Cholesterol (Calc): 99 mg/dL
Non-HDL Cholesterol (Calc): 114 mg/dL (ref <130)
Total CHOL/HDL Ratio: 3 (calc) (ref <5.0)
Triglycerides: 66 mg/dL (ref <150)

## 2022-12-03 LAB — HEMOGLOBIN A1C
Hgb A1c MFr Bld: 6.2 %{Hb} — ABNORMAL HIGH (ref <5.7)
Mean Plasma Glucose: 131 mg/dL
eAG (mmol/L): 7.3 mmol/L

## 2022-12-03 LAB — VITAMIN B12: Vitamin B-12: 262 pg/mL (ref 200–1100)

## 2022-12-03 LAB — TSH: TSH: 1.53 m[IU]/L (ref 0.40–4.50)

## 2022-12-06 NOTE — Telephone Encounter (Signed)
At this time, we had not received notice that PA needed to be completed. I will notify patient and start PA.

## 2022-12-06 NOTE — Telephone Encounter (Signed)
Copied from CRM [id number]. Topic: General - Other >> Dec 05, 2022 11:46 AM Macon Large wrote: Reason for CRM: Ray with [facility] called for an update on the prior authorization for REPATHA SURECLICK 140 MG/ML SOAJ. Cb# [phone]

## 2022-12-06 NOTE — Telephone Encounter (Signed)
Patient notified that PA was submitted today and can take up to about a week. We will notify her when we have response. She verbalized understanding and appreciation.

## 2022-12-14 DIAGNOSIS — M79601 Pain in right arm: Secondary | ICD-10-CM | POA: Diagnosis not present

## 2022-12-14 DIAGNOSIS — G959 Disease of spinal cord, unspecified: Secondary | ICD-10-CM | POA: Diagnosis not present

## 2022-12-14 DIAGNOSIS — G4700 Insomnia, unspecified: Secondary | ICD-10-CM | POA: Diagnosis not present

## 2022-12-14 DIAGNOSIS — R29898 Other symptoms and signs involving the musculoskeletal system: Secondary | ICD-10-CM | POA: Diagnosis not present

## 2022-12-14 DIAGNOSIS — Z950 Presence of cardiac pacemaker: Secondary | ICD-10-CM | POA: Diagnosis not present

## 2022-12-14 DIAGNOSIS — G2581 Restless legs syndrome: Secondary | ICD-10-CM | POA: Diagnosis not present

## 2022-12-14 DIAGNOSIS — R600 Localized edema: Secondary | ICD-10-CM | POA: Diagnosis not present

## 2022-12-14 DIAGNOSIS — R200 Anesthesia of skin: Secondary | ICD-10-CM | POA: Diagnosis not present

## 2022-12-14 DIAGNOSIS — M79641 Pain in right hand: Secondary | ICD-10-CM | POA: Diagnosis not present

## 2022-12-14 DIAGNOSIS — G629 Polyneuropathy, unspecified: Secondary | ICD-10-CM | POA: Diagnosis not present

## 2022-12-14 DIAGNOSIS — M79604 Pain in right leg: Secondary | ICD-10-CM | POA: Diagnosis not present

## 2022-12-14 DIAGNOSIS — M62838 Other muscle spasm: Secondary | ICD-10-CM | POA: Diagnosis not present

## 2022-12-19 ENCOUNTER — Other Ambulatory Visit: Payer: Self-pay | Admitting: Family Medicine

## 2022-12-19 DIAGNOSIS — G2581 Restless legs syndrome: Secondary | ICD-10-CM

## 2022-12-20 DIAGNOSIS — I10 Essential (primary) hypertension: Secondary | ICD-10-CM | POA: Diagnosis not present

## 2022-12-20 DIAGNOSIS — H2513 Age-related nuclear cataract, bilateral: Secondary | ICD-10-CM | POA: Diagnosis not present

## 2022-12-20 DIAGNOSIS — H33321 Round hole, right eye: Secondary | ICD-10-CM | POA: Diagnosis not present

## 2022-12-20 NOTE — Telephone Encounter (Signed)
Requested Prescriptions  Refused Prescriptions Disp Refills   pramipexole (MIRAPEX) 1 MG tablet [Pharmacy Med Name: PRAMIPEXOLE  1MG   TAB] 200 tablet 2    Sig: TAKE 1 TABLET BY MOUTH TWICE  DAILY     Neurology:  Parkinsonian Agents Passed - 12/19/2022  5:16 AM      Passed - Last BP in normal range    BP Readings from Last 1 Encounters:  12/02/22 122/68         Passed - Last Heart Rate in normal range    Pulse Readings from Last 1 Encounters:  12/02/22 80         Passed - Valid encounter within last 12 months    Recent Outpatient Visits           2 weeks ago Annual physical exam   [place] [facility] [place], Netta Neat, DO   5 months ago Type 2 diabetes mellitus with other specified complication, without long-term current use of insulin [facility])   [place] [facility] Smitty Cords, DO   9 months ago Lymphedema   [place] [facility] Smitty Cords, DO   1 year ago Annual physical exam   [place] [facility] Smitty Cords, DO   1 year ago Venous stasis dermatitis of left lower extremity   [place] [facility] [place], Netta Neat, DO       Future Appointments             In 2 months Althea Charon, Netta Neat, DO [place] [facility], [facility]

## 2022-12-21 DIAGNOSIS — H35462 Secondary vitreoretinal degeneration, left eye: Secondary | ICD-10-CM | POA: Diagnosis not present

## 2022-12-21 DIAGNOSIS — H43813 Vitreous degeneration, bilateral: Secondary | ICD-10-CM | POA: Diagnosis not present

## 2022-12-21 DIAGNOSIS — H33331 Multiple defects of retina without detachment, right eye: Secondary | ICD-10-CM | POA: Diagnosis not present

## 2023-01-12 DIAGNOSIS — M79641 Pain in right hand: Secondary | ICD-10-CM | POA: Diagnosis not present

## 2023-01-12 DIAGNOSIS — M62838 Other muscle spasm: Secondary | ICD-10-CM | POA: Diagnosis not present

## 2023-01-12 DIAGNOSIS — M79642 Pain in left hand: Secondary | ICD-10-CM | POA: Diagnosis not present

## 2023-01-12 DIAGNOSIS — G2581 Restless legs syndrome: Secondary | ICD-10-CM | POA: Diagnosis not present

## 2023-01-13 DIAGNOSIS — H43813 Vitreous degeneration, bilateral: Secondary | ICD-10-CM | POA: Diagnosis not present

## 2023-01-13 DIAGNOSIS — H35462 Secondary vitreoretinal degeneration, left eye: Secondary | ICD-10-CM | POA: Diagnosis not present

## 2023-01-13 DIAGNOSIS — H31091 Other chorioretinal scars, right eye: Secondary | ICD-10-CM | POA: Diagnosis not present

## 2023-01-17 DIAGNOSIS — B372 Candidiasis of skin and nail: Secondary | ICD-10-CM | POA: Diagnosis not present

## 2023-01-19 DIAGNOSIS — E119 Type 2 diabetes mellitus without complications: Secondary | ICD-10-CM | POA: Diagnosis not present

## 2023-01-19 DIAGNOSIS — I739 Peripheral vascular disease, unspecified: Secondary | ICD-10-CM | POA: Diagnosis not present

## 2023-01-19 DIAGNOSIS — I10 Essential (primary) hypertension: Secondary | ICD-10-CM | POA: Diagnosis not present

## 2023-01-19 DIAGNOSIS — K219 Gastro-esophageal reflux disease without esophagitis: Secondary | ICD-10-CM | POA: Diagnosis not present

## 2023-01-19 DIAGNOSIS — K2900 Acute gastritis without bleeding: Secondary | ICD-10-CM | POA: Diagnosis not present

## 2023-01-19 DIAGNOSIS — R600 Localized edema: Secondary | ICD-10-CM | POA: Diagnosis not present

## 2023-01-19 DIAGNOSIS — G4733 Obstructive sleep apnea (adult) (pediatric): Secondary | ICD-10-CM | POA: Diagnosis not present

## 2023-01-19 DIAGNOSIS — E785 Hyperlipidemia, unspecified: Secondary | ICD-10-CM | POA: Diagnosis not present

## 2023-01-19 DIAGNOSIS — G629 Polyneuropathy, unspecified: Secondary | ICD-10-CM | POA: Diagnosis not present

## 2023-01-19 DIAGNOSIS — Z950 Presence of cardiac pacemaker: Secondary | ICD-10-CM | POA: Diagnosis not present

## 2023-01-19 DIAGNOSIS — R0789 Other chest pain: Secondary | ICD-10-CM | POA: Diagnosis not present

## 2023-01-20 ENCOUNTER — Other Ambulatory Visit: Payer: Self-pay | Admitting: Internal Medicine

## 2023-01-20 ENCOUNTER — Ambulatory Visit (INDEPENDENT_AMBULATORY_CARE_PROVIDER_SITE_OTHER): Payer: 59 | Admitting: Internal Medicine

## 2023-01-20 ENCOUNTER — Encounter: Payer: Self-pay | Admitting: Internal Medicine

## 2023-01-20 VITALS — BP 128/80 | Ht 60.03 in | Wt 216.0 lb

## 2023-01-20 DIAGNOSIS — R1013 Epigastric pain: Secondary | ICD-10-CM

## 2023-01-20 MED ORDER — OMEPRAZOLE 20 MG PO CPDR
20.0000 mg | DELAYED_RELEASE_CAPSULE | Freq: Every day | ORAL | 0 refills | Status: DC
Start: 2023-01-20 — End: 2023-01-25

## 2023-01-20 NOTE — Telephone Encounter (Signed)
Requested Prescriptions  Refused Prescriptions Disp Refills   omeprazole (PRILOSEC) 20 MG capsule [Pharmacy Med Name: OMEPRAZOLE 20MG  CAPSULES] 90 capsule     Sig: TAKE 1 CAPSULE(20 MG) BY MOUTH DAILY     Gastroenterology: Proton Pump Inhibitors Passed - 01/20/2023  2:51 PM      Passed - Valid encounter within last 12 months    Recent Outpatient Visits           Today Epigastric pain   [place] [facility] [place], Salvadore [place], NP   1 month ago Annual physical exam   [place] [facility] [place], Netta Neat, DO   6 months ago Type 2 diabetes mellitus with other specified complication, without long-term current use of insulin [facility])   [place] [facility] Smitty Cords, DO   10 months ago Lymphedema   [place] [facility] Smitty Cords, DO   1 year ago Annual physical exam   [place] [facility] Smitty Cords, DO       Future Appointments             In 1 month Althea Charon, Netta Neat, DO [place] [facility], [facility]

## 2023-01-20 NOTE — Progress Notes (Signed)
Subjective:    Patient ID: Janet [name], female    DOB: [birth year], 72 y.o.   MRN: [id number]  HPI  Discussed the use of AI scribe software for clinical note transcription with the patient, who gave verbal consent to proceed.   The patient presented with a chief complaint of severe, sharp abdominal pain that started two days prior. The pain was located in the upper abdomen on both sides and was described as very strong and sharp. The pain was not associated with eating and had an intermittent pattern, with episodes of severe pain followed by periods of less intense discomfort. The patient denied experiencing any associated symptoms such as diarrhea, nausea, vomiting, or heartburn.  The patient's bowel movements were reported to be normal, with no blood in the stool. The patient denied taking any over-the-counter medications such as ibuprofen. This was the first time the patient had experienced abdominal pain of this nature.  The patient had sought care at an urgent care center the previous night, where she was evaluated but no labs or imaging were performed. The patient had not taken any medications to alleviate the pain, which had improved slightly at the time of the consultation.  The patient reported having a bowel movement on the day of the consultation, which did not affect the pain. The patient denied any urinary or vaginal symptoms and reported no alcohol consumption. The patient was not experiencing any bloating or gassiness, only the abdominal pain.        Review of Systems   Past Medical History:  Diagnosis Date   Anxiety    a.) on BZO (clonazepam) PRN   Cervical spinal stenosis    Depression    Diastolic dysfunction    a.)  TTE 05/2021: EF 90%, mild LVH, trivial MR/TR/PR, G1DD   Esophageal reflux    Esophageal stricture    a.) s/p multiple (2012, 2015, 2017, 2020) dilitation procedures   GERD (gastroesophageal reflux disease)    Heart murmur    Hyperlipidemia     Hypertension    Insomnia    Osteoarthritis    Osteoporosis    Other secondary kyphosis, cervical region    PAD (peripheral artery disease) (HCC)    PONV (postoperative nausea and vomiting)    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 Jude device   RLS (restless legs syndrome)    a.) takes pramipexole   Sleep apnea    SOB (shortness of breath)    T2DM (type 2 diabetes mellitus) (HCC)    Urinary incontinence    Venous insufficiency of both lower extremities     Current Outpatient Medications  Medication Sig Dispense Refill   aspirin 81 MG EC tablet Take 81 mg by mouth daily.     gabapentin (NEURONTIN) 300 MG capsule Take 300 mg by mouth 2 (two) times daily.     methocarbamol (ROBAXIN) 500 MG tablet TAKE 1 TABLET(500 MG) BY MOUTH EVERY 6 HOURS AS NEEDED FOR MUSCLE SPASMS 120 tablet 0   metoprolol tartrate (LOPRESSOR) 25 MG tablet Take 1 tablet (25 mg total) by mouth 2 (two) times daily. 180 tablet 3   pramipexole (MIRAPEX) 1 MG tablet Take 1 tablet (1 mg total) by mouth 2 (two) times daily. 180 tablet 3   REPATHA SURECLICK 140 MG/ML SOAJ Inject 140 mg into the skin every 14 (fourteen) days. 2 mL 2   traMADol (ULTRAM) 50 MG tablet Take 50 mg by mouth. 2-3 times daily PRN  valsartan (DIOVAN) 80 MG tablet Take 1 tablet (80 mg total) by mouth daily. 90 tablet 3   No current facility-administered medications for this visit.    Allergies  Allergen Reactions   Celebrex [Celecoxib] Other (See Comments)    Burning all over, increased heartbeat, upset stomach    Family History  Problem Relation Age of Onset   Heart attack Mother 21   Heart disease Father    Thyroid disease Father    Skin cancer Father    Brain cancer Maternal Grandmother     Social History   Socioeconomic History   Marital status: Legally Separated    Spouse name: Malaiyah Leake   Number of children: 6   Years of education: Not on file   Highest education level: Not on file  Occupational  History   Occupation: Homemaker  Tobacco Use   Smoking status: Former    Current packs/day: 0.00    Average packs/day: 1 pack/day for 25.0 years (25.0 ttl pk-yrs)    Types: Cigarettes    Start date: 42    Quit date: 2007    Years since quitting: 17.9    Passive exposure: Never   Smokeless tobacco: Former  Building services engineer status: Never Used  Substance and Sexual Activity   Alcohol use: Yes    Comment: very rarely   Drug use: Never   Sexual activity: Not Currently  Other Topics Concern   Not on file  Social History Narrative   Not on file   Social Determinants of Health   Financial Resource Strain: Low Risk  (11/18/2022)   Overall Financial Resource Strain (CARDIA)    Difficulty of Paying Living Expenses: Not very hard  Food Insecurity: No Food Insecurity (11/18/2022)   Hunger Vital Sign    Worried About Running Out of Food in the Last Year: Never true    Ran Out of Food in the Last Year: Never true  Transportation Needs: No Transportation Needs (11/18/2022)   PRAPARE - Administrator, Civil Service (Medical): No    Lack of Transportation (Non-Medical): No  Physical Activity: Inactive (11/18/2022)   Exercise Vital Sign    Days of Exercise per Week: 0 days    Minutes of Exercise per Session: 0 min  Stress: No Stress Concern Present (11/18/2022)   [employer] of Occupational Health - Occupational Stress Questionnaire    Feeling of Stress : Only a little  Social Connections: Moderately Isolated (11/18/2022)   Social Connection and Isolation Panel [NHANES]    Frequency of Communication with Friends and Family: Twice a week    Frequency of Social Gatherings with Friends and Family: Once a week    Attends Religious Services: More than 4 times per year    Active Member of [employer] or Organizations: No    Attends Banker Meetings: Never    Marital Status: Divorced  Catering manager Violence: Not At Risk (11/18/2022)   Humiliation, Afraid, Rape, and  Kick questionnaire    Fear of Current or Ex-Partner: No    Emotionally Abused: No    Physically Abused: No    Sexually Abused: No     Constitutional: Denies fever, malaise, fatigue, headache or abrupt weight changes.  Respiratory: Denies difficulty breathing, shortness of breath, cough or sputum production.   Cardiovascular: Denies chest pain, chest tightness, palpitations or swelling in the hands or feet.  Gastrointestinal: Patient reports epigastric abdominal pain.  Denies bloating, constipation, diarrhea or blood in the stool.  GU: Patient reports urinary incontinence.  Denies urgency, frequency, pain with urination, burning sensation, blood in urine, odor or discharge. Musculoskeletal: Denies decrease in range of motion, difficulty with gait, muscle pain or joint pain and swelling.  Skin: Denies redness, rashes, lesions or ulcercations.    No other specific complaints in a complete review of systems (except as listed in HPI above).      Objective:   Physical Exam  BP 128/80   Ht 5' 0.03" (1.525 m)   Wt 216 lb (98 kg)   BMI 42.15 kg/m   Wt Readings from Last 3 Encounters:  12/02/22 219 lb (99.3 kg)  10/25/22 218 lb (98.9 kg)  07/08/22 220 lb (99.8 kg)    General: Appears her stated age, obese, in NAD. Skin: Warm, dry and intact. No rashes noted. Cardiovascular: Normal rate and rhythm. S1,S2 noted.  No murmur, rubs or gallops noted.  Pulmonary/Chest: Normal effort and positive vesicular breath sounds. No respiratory distress. No wheezes, rales or ronchi noted.  Abdomen: Soft and tender in the epigastric region. Normal bowel sounds. No distention or masses noted.  Musculoskeletal:No difficulty with gait.  Neurological: Alert and oriented.  BMET    Component Value Date/Time   NA 141 12/02/2022 1104   K 4.5 12/02/2022 1104   CL 105 12/02/2022 1104   CO2 30 12/02/2022 1104   GLUCOSE 109 12/02/2022 1104   BUN 13 12/02/2022 1104   CREATININE 0.64 12/02/2022 1104    CALCIUM 9.2 12/02/2022 1104   GFRNONAA >60 07/08/2022 1157   GFRNONAA 93 09/12/2019 0803   GFRAA 107 09/12/2019 0803    Lipid Panel     Component Value Date/Time   CHOL 170 12/02/2022 1104   TRIG 66 12/02/2022 1104   HDL 56 12/02/2022 1104   CHOLHDL 3.0 12/02/2022 1104   LDLCALC 99 12/02/2022 1104    CBC    Component Value Date/Time   WBC 7.1 12/02/2022 1104   RBC 4.48 12/02/2022 1104   HGB 12.0 12/02/2022 1104   HCT 39.9 12/02/2022 1104   PLT 285 12/02/2022 1104   MCV 89.1 12/02/2022 1104   MCH 26.8 (L) 12/02/2022 1104   MCHC 30.1 (L) 12/02/2022 1104   RDW 14.5 12/02/2022 1104   LYMPHSABS 1,974 12/02/2022 1104   MONOABS 0.9 02/26/2021 1159   EOSABS 277 12/02/2022 1104   BASOSABS 21 12/02/2022 1104    Hgb A1C Lab Results  Component Value Date   HGBA1C 6.2 (H) 12/02/2022            Assessment & Plan:   Assessment and Plan    Abdominal Pain New onset, severe, sharp, bilateral upper abdominal pain for 2 days. No associated nausea, vomiting, diarrhea, or heartburn. Normal bowel movements. No history of similar pain. No recent NSAID use. No urinary or vaginal symptoms. No alcohol use. No bloating or gassiness. -Order CBC, liver function tests, pancreatic function tests, and H. pylori breath test. -Start Prilosec 20mg  daily in the morning. -If pain worsens, patient to go to ER for abdominal imaging.  Follow-up with your PCP as previously scheduled Nicki Reaper, NP

## 2023-01-20 NOTE — Patient Instructions (Signed)
Abdominal Pain, Adult    Many things can cause belly (abdominal) pain. In most cases, belly pain is not a serious problem and can be watched and treated at home. But in some cases, it can be serious.  Your doctor will try to find the cause of your belly pain.  Follow these instructions at home:  Medicines  Take over-the-counter and prescription medicines only as told by your doctor.  Do not take medicines that help you poop (laxatives) unless told by your doctor.  General instructions  Watch your belly pain for any changes. Tell your doctor if the pain gets worse.  Drink enough fluid to keep your pee (urine) pale yellow.  Contact a doctor if:  Your belly pain changes or gets worse.  You have very bad cramping or bloating in your belly.  You vomit.  Your pain gets worse with meals, after eating, or with certain foods.  You have trouble pooping or have watery poop for more than 2-3 days.  You are not hungry, or you lose weight without trying.  You have signs of not getting enough fluid or water (dehydration). These may include:  Dark pee, very little pee, or no pee.  Cracked lips or dry mouth.  Feeling sleepy or weak.  You have pain when you pee or poop.  Your belly pain wakes you up at night.  You have blood in your pee.  You have a fever.  Get help right away if:  You cannot stop vomiting.  Your pain is only in one part of your belly, like on the right side.  You have bloody or black poop, or poop that looks like tar.  You have trouble breathing.  You have chest pain.  These symptoms may be an emergency. Get help right away. Call 911.  Do not wait to see if the symptoms will go away.  Do not drive yourself to the hospital.  This information is not intended to replace advice given to you by your health care provider. Make sure you discuss any questions you have with your health care provider.  Document Revised: 12/01/2021 Document Reviewed: 12/01/2021  Elsevier Patient Education  2024 [employer].

## 2023-01-25 LAB — COMPLETE METABOLIC PANEL WITH GFR
AG Ratio: 1.3 (calc) (ref 1.0–2.5)
ALT: 29 U/L (ref 6–29)
AST: 32 U/L (ref 10–35)
Albumin: 4.3 g/dL (ref 3.6–5.1)
Alkaline phosphatase (APISO): 137 U/L (ref 37–153)
BUN: 22 mg/dL (ref 7–25)
CO2: 29 mmol/L (ref 20–32)
Calcium: 9.8 mg/dL (ref 8.6–10.4)
Chloride: 105 mmol/L (ref 98–110)
Creat: 0.76 mg/dL (ref 0.60–1.00)
Globulin: 3.2 g/dL (ref 1.9–3.7)
Glucose, Bld: 90 mg/dL (ref 65–139)
Potassium: 4.8 mmol/L (ref 3.5–5.3)
Sodium: 141 mmol/L (ref 135–146)
Total Bilirubin: 0.7 mg/dL (ref 0.2–1.2)
Total Protein: 7.5 g/dL (ref 6.1–8.1)
eGFR: 84 mL/min/{1.73_m2} (ref >=60)

## 2023-01-25 LAB — CBC
HCT: 40.8 % (ref 35.0–45.0)
Hemoglobin: 12.8 g/dL (ref 11.7–15.5)
MCH: 27.4 pg (ref 27.0–33.0)
MCHC: 31.4 g/dL — ABNORMAL LOW (ref 32.0–36.0)
MCV: 87.4 fL (ref 80.0–100.0)
MPV: 12 fL (ref 7.5–12.5)
Platelets: 325 10*3/uL (ref 140–400)
RBC: 4.67 10*6/uL (ref 3.80–5.10)
RDW: 14.7 % (ref 11.0–15.0)
WBC: 8.8 10*3/uL (ref 3.8–10.8)

## 2023-01-25 LAB — LIPASE: Lipase: 33 U/L (ref 7–60)

## 2023-01-25 LAB — H. PYLORI BREATH TEST: H. pylori Breath Test: DETECTED — AB

## 2023-01-25 MED ORDER — PANTOPRAZOLE SODIUM 40 MG PO TBEC: 40.0000 mg | DELAYED_RELEASE_TABLET | Freq: Two times a day (BID) | ORAL | 0 refills | Status: DC

## 2023-01-25 MED ORDER — METRONIDAZOLE 500 MG PO TABS: 500.0000 mg | ORAL_TABLET | Freq: Three times a day (TID) | ORAL | 0 refills | Status: AC

## 2023-01-25 MED ORDER — CLARITHROMYCIN 500 MG PO TABS: 500.0000 mg | ORAL_TABLET | Freq: Two times a day (BID) | ORAL | 0 refills | Status: AC

## 2023-01-25 NOTE — Addendum Note (Signed)
Addended by: Lorre Munroe on: 01/25/2023 01:35 PM   Modules accepted: Orders

## 2023-02-09 ENCOUNTER — Ambulatory Visit
Admission: RE | Admit: 2023-02-09 | Discharge: 2023-02-09 | Disposition: A | Discharge status: Home / Self Care | Payer: 59 | Source: Ambulatory Visit | Attending: Family Medicine | Admitting: Family Medicine

## 2023-02-09 ENCOUNTER — Encounter: Payer: Self-pay | Admitting: Family Medicine

## 2023-02-09 ENCOUNTER — Ambulatory Visit (INDEPENDENT_AMBULATORY_CARE_PROVIDER_SITE_OTHER): Payer: 59 | Admitting: Family Medicine

## 2023-02-09 VITALS — BP 132/76 | HR 76 | Ht 60.3 in | Wt 221.0 lb

## 2023-02-09 DIAGNOSIS — R590 Localized enlarged lymph nodes: Secondary | ICD-10-CM

## 2023-02-09 DIAGNOSIS — R591 Generalized enlarged lymph nodes: Secondary | ICD-10-CM | POA: Diagnosis not present

## 2023-02-09 DIAGNOSIS — I6522 Occlusion and stenosis of left carotid artery: Secondary | ICD-10-CM

## 2023-02-09 DIAGNOSIS — R599 Enlarged lymph nodes, unspecified: Secondary | ICD-10-CM | POA: Insufficient documentation

## 2023-02-09 DIAGNOSIS — K429 Umbilical hernia without obstruction or gangrene: Secondary | ICD-10-CM | POA: Diagnosis not present

## 2023-02-09 DIAGNOSIS — I700 Atherosclerosis of aorta: Secondary | ICD-10-CM | POA: Diagnosis not present

## 2023-02-09 MED ORDER — REPATHA SURECLICK 140 MG/ML ~~LOC~~ SOAJ: 140.0000 mg | SUBCUTANEOUS | 5 refills | Status: DC

## 2023-02-09 MED ORDER — IOHEXOL 300 MG/ML  SOLN
100.0000 mL | Freq: Once | INTRAMUSCULAR | Status: AC | PRN
  Administered 2023-02-09: 100 mL via INTRAVENOUS

## 2023-02-09 NOTE — Progress Notes (Signed)
Subjective:    Patient ID: Janet [name], female    DOB: 03/12/1950, 72 y.o.   MRN: [id number]  Janet [name] is a 72 y.o. female presenting on 02/09/2023 for Acute Visit   HPI  Discussed the use of AI scribe software for clinical note transcription with the patient, who gave verbal consent to proceed.  Lymphadenopathy  The patient, with a history of arthritis, presents with a three-week history of multiple nodules appearing in various parts of her body. These nodules, initially large, decrease in size over a few days but do not completely disappear. The patient reports one nodule in the groin area that was particularly large at onset but has since reduced in size. She also reports nodules in the abdominal area, one of which is painful, possibly due to friction from clothing. The patient also notes nodules in the neck area. She denies any surface drainage from these nodules. The patient also reports occasional episodes of sweating, predominantly in the afternoons, but is unsure if this is related to the nodules.  Denies Fever   Atherosclerosis On repatha Will re order today       02/09/2023    9:29 AM 01/20/2023    2:20 PM 12/02/2022   10:27 AM  Depression screen PHQ 2/9  Decreased Interest 1 1 0  Down, Depressed, Hopeless 1 1 1   PHQ - 2 Score 2 2 1   Altered sleeping 3 3 3   Tired, decreased energy 1 1 1   Change in appetite 0 1 0  Feeling bad or failure about yourself  1 0 1  Trouble concentrating 1 1 1   Moving slowly or fidgety/restless 0 0 0  Suicidal thoughts 0 0 1  PHQ-9 Score 8 8 8   Difficult doing work/chores  Somewhat difficult Somewhat difficult       02/09/2023    9:29 AM 01/20/2023    2:20 PM 12/02/2022   10:28 AM 07/01/2022    8:59 AM  GAD 7 : Generalized Anxiety Score  Nervous, Anxious, on Edge 1 1 1  0  Control/stop worrying 0 0 1 0  Worry too much - different things 1 0 1 0  Trouble relaxing 1 1 1 1   Restless 2 1 1  0  Easily annoyed or irritable 1 0 0 0   Afraid - awful might happen 0 0 0 0  Total GAD 7 Score 6 3 5 1   Anxiety Difficulty  Not difficult at all      Social History   Tobacco Use   Smoking status: Former    Current packs/day: 0.00    Average packs/day: 1 pack/day for 25.0 years (25.0 ttl pk-yrs)    Types: Cigarettes    Start date: 83    Quit date: 2007    Years since quitting: 17.9    Passive exposure: Never   Smokeless tobacco: Former  Building services engineer status: Never Used  Substance Use Topics   Alcohol use: Yes    Comment: very rarely   Drug use: Never    Review of Systems Per HPI unless specifically indicated above     Objective:    BP 132/76   Pulse 76   Ht 5' 0.3" (1.532 m)   Wt 221 lb (100.2 kg)   BMI 42.73 kg/m   Wt Readings from Last 3 Encounters:  02/09/23 221 lb (100.2 kg)  01/20/23 216 lb (98 kg)  12/02/22 219 lb (99.3 kg)    Physical Exam Vitals and nursing note reviewed.  Constitutional:      General: She is not in acute distress.    Appearance: Normal appearance. She is well-developed. She is not diaphoretic.     Comments: Well-appearing, comfortable, cooperative  HENT:     Head: Normocephalic and atraumatic.  Eyes:     General:        Right eye: No discharge.        Left eye: No discharge.     Conjunctiva/sclera: Conjunctivae normal.  Cardiovascular:     Rate and Rhythm: Normal rate.  Pulmonary:     Effort: Pulmonary effort is normal.  Abdominal:     General: Bowel sounds are normal.     Palpations: There is mass (Multiple areas of mild to moderate lymphadenopathy lower abdomen and inguinal region).     Tenderness: There is no abdominal tenderness. There is no guarding.     Hernia: A hernia (approx 2 cm superior umbilical hernia on exam seems most likely given palpation and provoking valsalva movement.) is present.  Lymphadenopathy:     Head:     Right side of head: No submental, submandibular, tonsillar, preauricular, posterior auricular or occipital adenopathy.      Left side of head: No submental, submandibular, tonsillar, preauricular, posterior auricular or occipital adenopathy.     Cervical: Cervical adenopathy present.     Right cervical: Superficial cervical adenopathy present. No posterior cervical adenopathy.    Left cervical: Posterior cervical adenopathy present.     Upper Body:     Right upper body: No supraclavicular or axillary adenopathy.     Left upper body: No supraclavicular or axillary adenopathy.     Lower Body: Right inguinal adenopathy present. Left inguinal adenopathy present.  Skin:    General: Skin is warm and dry.     Findings: No erythema or rash.  Neurological:     Mental Status: She is alert and oriented to person, place, and time.  Psychiatric:        Mood and Affect: Mood normal.        Behavior: Behavior normal.        Thought Content: Thought content normal.     Comments: Well groomed, good eye contact, normal speech and thoughts    I have personally reviewed the radiology report from 02/09/23 on CT Abd Pelvis w Contrast.  CLINICAL DATA:  Inguinal lymphadenopathy.   EXAM: CT ABDOMEN AND PELVIS WITH CONTRAST   TECHNIQUE: Multidetector CT imaging of the abdomen and pelvis was performed using the standard protocol following bolus administration of intravenous contrast.   RADIATION DOSE REDUCTION: This exam was performed according to the departmental dose-optimization program which includes automated exposure control, adjustment of the mA and/or kV according to patient size and/or use of iterative reconstruction technique.   CONTRAST:  OMNIPAQUE IOHEXOL 300 MG/ML  SOLN   COMPARISON:  None Available.   FINDINGS: Lower chest: The lung bases are clear of acute process. No pleural effusion or pulmonary lesions. The heart is normal in size. No pericardial effusion. The distal esophagus and aorta are unremarkable. Pacer wires are noted.   Hepatobiliary: No hepatic lesions or intrahepatic  biliary dilatation. The gallbladder is normal. No common bile duct dilatation.   Pancreas: No mass, inflammation or ductal dilatation.   Spleen: Normal size.  No focal lesions.   Adrenals/Urinary Tract: 10 mm left adrenal gland nodule is indeterminate but most likely a benign adenoma. Recommend follow-up noncontrast abdominal CT scan in 6 months to document stability.   No renal,  ureteral or bladder calculi or mass.   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 No complicating features are identified. The small bowel and colon are unremarkable. No inflammatory changes, mass lesions or obstructive findings.   Vascular/Lymphatic: Scattered atherosclerotic calcifications involving the aorta but no aneurysm dissection. The major venous structures are patent. No mesenteric or retroperitoneal mass or adenopathy. Small scattered lymph nodes are noted.   Few small scattered sub 8 mm pelvic lymph nodes are noted but no overt adenopathy the. Borderline enlarged inguinal lymph nodes. Largest right inguinal node on image 90/2 measures 14 mm and left inguinal node measures 12 mm. Fairly normal appearing lymph node architecture. Recommend clinical surveillance. Lymph node biopsy is also an option if clinical concern.   Reproductive: The uterus and ovaries are unremarkable.   Other: Small periumbilical abdominal wall hernia containing fat. No ascites. No subcutaneous lesions.   Musculoskeletal: Surgical fusion hardware noted without complicating features. No acute pulmonary findings or worrisome bone lesions.   IMPRESSION: 1. Borderline enlarged inguinal lymph nodes. Recommend clinical surveillance or ymph node biopsy if clinical concern. No abdominal/pelvic lymphadenopathy. 2. 10 mm left adrenal gland nodule is indeterminate but most likely a benign adenoma. Recommend follow-up noncontrast abdominal CT scan in 6 months to document stability. 3. Surgical changes from  gastric bypass surgery. No complicating features are identified. 4. Small periumbilical abdominal hernia containing fat. 5. Aortic atherosclerosis.   Aortic Atherosclerosis (ICD10-I70.0).     Electronically Signed   By: Rudie Meyer M.D.   On: 02/09/2023 12:04  Results for orders placed or performed in visit on 01/20/23  CBC   Collection Time: 01/20/23  2:01 PM  Result Value Ref Range   WBC 8.8 3.8 - 10.8 Thousand/uL   RBC 4.67 3.80 - 5.10 Million/uL   Hemoglobin 12.8 11.7 - 15.5 g/dL   HCT 16.1 09.6 - 04.5 %   MCV 87.4 80.0 - 100.0 fL   MCH 27.4 27.0 - 33.0 pg   MCHC 31.4 (L) 32.0 - 36.0 g/dL   RDW 40.9 81.1 - 91.4 %   Platelets 325 140 - 400 Thousand/uL   MPV 12.0 7.5 - 12.5 fL  COMPLETE METABOLIC PANEL WITH GFR   Collection Time: 01/20/23  2:01 PM  Result Value Ref Range   Glucose, Bld 90 65 - 139 mg/dL   BUN 22 7 - 25 mg/dL   Creat 7.82 9.56 - 2.13 mg/dL   eGFR 84 > OR = 60 YQ/MVH/8.46N6   BUN/Creatinine Ratio SEE NOTE: 6 - 22 (calc)   Sodium 141 135 - 146 mmol/L   Potassium 4.8 3.5 - 5.3 mmol/L   Chloride 105 98 - 110 mmol/L   CO2 29 20 - 32 mmol/L   Calcium 9.8 8.6 - 10.4 mg/dL   Total Protein 7.5 6.1 - 8.1 g/dL   Albumin 4.3 3.6 - 5.1 g/dL   Globulin 3.2 1.9 - 3.7 g/dL (calc)   AG Ratio 1.3 1.0 - 2.5 (calc)   Total Bilirubin 0.7 0.2 - 1.2 mg/dL   Alkaline phosphatase (APISO) 137 37 - 153 U/L   AST 32 10 - 35 U/L   ALT 29 6 - 29 U/L  Lipase   Collection Time: 01/20/23  2:01 PM  Result Value Ref Range   Lipase 33 7 - 60 U/L  H. pylori breath test   Collection Time: 01/20/23  2:01 PM  Result Value Ref Range   H. pylori Breath Test DETECTED (A) NOT DETECTED      Assessment &  Plan:   Problem List Items Addressed This Visit     Atherosclerosis of left carotid artery   Relevant Medications   REPATHA SURECLICK 140 MG/ML SOAJ   Other Visit Diagnoses       Pelvic lymphadenopathy    -  Primary     Enlarged lymph nodes       Relevant Orders   CT  ABDOMEN PELVIS W CONTRAST (Completed)     Lymphadenopathy, abdominal         Head and neck lymphadenopathy         Aortic atherosclerosis (HCC)       Relevant Medications   REPATHA SURECLICK 140 MG/ML SOAJ        Multiple Nodules Multiple nodules in various locations including abdomen, legs, and neck, which fluctuate in size. Differential includes hernia, lymphadenopathy, or cysts. -Order CT scan of abdomen and pelvis to further evaluate nodules. -Consider further imaging or specialist referral based on CT scan results.  Update Reviewed results of CT and discussed case with IR at [facility]. The result shows borderline inguinal lymphadenopathy Umbilical fat containing hernia  The IR said that the lymph nodes inguinal region are borderline and may not be amenable size and location for biopsy and they are actually appear to be fairly unremarkable and may not warrant biopsy and if done, which is possible, it could be low yield diagnostically.  Reviewed last CBC and WBC counts are normal range. Unlikely to be other CLL lymphoma concern and a lack of significant B symptoms right now.  I called patient back and we agreed to monitor for 3-4 weeks. In early Jan 2025, we will plan to pursue Neck Soft Tissue US for Lymphadenopathy if still present and consider biopsy of those lymph nodes for diagnosis.  She agrees to monitor and consider neck US at this time.  Note the umbilical hernia is not significant and we discussed management and surveillance  Atherosclerosis On Repatha, unsure if new insurance will cover the medication. -Continue Repatha, prescription sent to [facility]. -Check with insurance regarding coverage for Repatha.         Orders Placed This Encounter  Procedures   CT ABDOMEN PELVIS W CONTRAST    Standing Status:   Future    Number of Occurrences:   1    Expiration Date:   02/09/2024    If indicated for the ordered procedure, I authorize the administration of contrast  media per Radiology protocol:   Yes    Does the patient have a contrast media/X-ray dye allergy?:   No    Preferred imaging location?:   [facility]    If indicated for the ordered procedure, I authorize the administration of oral contrast media per Radiology protocol:   Yes    Call Results- Best Contact Number?:   [phone]    Meds ordered this encounter  Medications   REPATHA SURECLICK 140 MG/ML SOAJ    Sig: Inject 140 mg into the skin every 14 (fourteen) days.    Dispense:  2 mL    Refill:  5    Follow up plan: Return if symptoms worsen or fail to improve.  Saralyn Pilar, DO [facility] 02/09/2023, 9:49 AM

## 2023-02-09 NOTE — Patient Instructions (Addendum)
Thank you for coming to the office today.  CT Scan ordered for possible lymph nodes in abdomen and groin. We will check this first  May consider an Ultrasound for the neck or CT there as well if needed.  Please schedule a Follow-up Appointment to: Return if symptoms worsen or fail to improve.  If you have any other questions or concerns, please feel free to call the office or send a message through MyChart. You may also schedule an earlier appointment if necessary.  Additionally, you may be receiving a survey about your experience at our office within a few days to 1 week by e-mail or mail. We value your feedback.  Saralyn Pilar, DO [facility], [place]

## 2023-02-27 ENCOUNTER — Encounter: Payer: Self-pay | Admitting: Family Medicine

## 2023-03-07 ENCOUNTER — Ambulatory Visit (INDEPENDENT_AMBULATORY_CARE_PROVIDER_SITE_OTHER): Payer: Medicare HMO | Admitting: Family Medicine

## 2023-03-07 ENCOUNTER — Encounter: Payer: Self-pay | Admitting: Family Medicine

## 2023-03-07 VITALS — BP 140/90 | HR 77 | Ht 60.3 in | Wt 223.0 lb

## 2023-03-07 DIAGNOSIS — I6522 Occlusion and stenosis of left carotid artery: Secondary | ICD-10-CM | POA: Diagnosis not present

## 2023-03-07 DIAGNOSIS — R590 Localized enlarged lymph nodes: Secondary | ICD-10-CM

## 2023-03-07 DIAGNOSIS — E1169 Type 2 diabetes mellitus with other specified complication: Secondary | ICD-10-CM | POA: Diagnosis not present

## 2023-03-07 DIAGNOSIS — K219 Gastro-esophageal reflux disease without esophagitis: Secondary | ICD-10-CM

## 2023-03-07 DIAGNOSIS — M5442 Lumbago with sciatica, left side: Secondary | ICD-10-CM | POA: Diagnosis not present

## 2023-03-07 DIAGNOSIS — Z950 Presence of cardiac pacemaker: Secondary | ICD-10-CM | POA: Diagnosis not present

## 2023-03-07 DIAGNOSIS — G2581 Restless legs syndrome: Secondary | ICD-10-CM | POA: Diagnosis not present

## 2023-03-07 DIAGNOSIS — G720 Drug-induced myopathy: Secondary | ICD-10-CM | POA: Diagnosis not present

## 2023-03-07 DIAGNOSIS — I700 Atherosclerosis of aorta: Secondary | ICD-10-CM

## 2023-03-07 DIAGNOSIS — G8929 Other chronic pain: Secondary | ICD-10-CM | POA: Diagnosis not present

## 2023-03-07 DIAGNOSIS — I10 Essential (primary) hypertension: Secondary | ICD-10-CM

## 2023-03-07 DIAGNOSIS — I495 Sick sinus syndrome: Secondary | ICD-10-CM | POA: Diagnosis not present

## 2023-03-07 LAB — POCT GLYCOSYLATED HEMOGLOBIN (HGB A1C): Hemoglobin A1C: 5.6 % (ref 4.0–5.6)

## 2023-03-07 MED ORDER — VALSARTAN 80 MG PO TABS: 80.0000 mg | ORAL_TABLET | Freq: Every day | ORAL | 3 refills | Status: DC

## 2023-03-07 MED ORDER — PANTOPRAZOLE SODIUM 40 MG PO TBEC: 40.0000 mg | DELAYED_RELEASE_TABLET | Freq: Every day | ORAL | 3 refills | Status: DC

## 2023-03-07 MED ORDER — REPATHA SURECLICK 140 MG/ML ~~LOC~~ SOAJ: 140.0000 mg | SUBCUTANEOUS | 5 refills | Status: DC

## 2023-03-07 MED ORDER — TRAMADOL HCL 50 MG PO TABS: 50.0000 mg | ORAL_TABLET | Freq: Three times a day (TID) | ORAL | 3 refills | Status: DC | PRN

## 2023-03-07 MED ORDER — PRAMIPEXOLE DIHYDROCHLORIDE 1 MG PO TABS: 1.0000 mg | ORAL_TABLET | Freq: Two times a day (BID) | ORAL | 3 refills | Status: AC

## 2023-03-07 NOTE — Progress Notes (Signed)
 Subjective:    Patient ID: Janet [name], female    DOB: [birth year], 73 y.o.   MRN: [id number]  Janet [name] is a 73 y.o. female presenting on 03/07/2023 for Diabetes   HPI  Discussed the use of AI scribe software for clinical note transcription with the patient, who gave verbal consent to proceed.  History of Present Illness     The patient, with a history of heart disease and neurological issues, presents with concerns about fluctuating lymph nodes. She reports a significant lymph node in the neck that had enlarged but has since reduced in size. She also notes occasional abdominal pain and lymph nodes in the neck. Recent CT Abdomen Pelvis reviewed without significant abnormal lymph node in groin, not requiring biopsy  The patient also reports worsening heartburn, despite a short-term course of pantoprazole  in November. She also mentions issues with sleep, which she believes may be related to a new medication.  The patient has been non-compliant with her metoprolol , a medication for her heart condition, due to feeling overwhelmed by the number of medications she is taking. She commits to restarting this medication. She also reports taking valsartan  daily for blood pressure control.  The patient is also on medication for restless leg syndrome, which she continues to take. She has stopped taking gabapentin  and Lyrica . She is also on a cholesterol injection, Repatha , and a pain medication, tramadol .  The patient's recent blood sugar level was 5.6, which is within the normal range.   Atherosclerosis On repatha , needs to get authorization Will re order today      02/09/2023    9:29 AM 01/20/2023    2:20 PM 12/02/2022   10:27 AM  Depression screen PHQ 2/9  Decreased Interest 1 1 0  Down, Depressed, Hopeless 1 1 1   PHQ - 2 Score 2 2 1   Altered sleeping 3 3 3   Tired, decreased energy 1 1 1   Change in appetite 0 1 0  Feeling bad or failure about yourself  1 0 1  Trouble concentrating 1 1  1   Moving slowly or fidgety/restless 0 0 0  Suicidal thoughts 0 0 1  PHQ-9 Score 8 8 8   Difficult doing work/chores  Somewhat difficult Somewhat difficult       02/09/2023    9:29 AM 01/20/2023    2:20 PM 12/02/2022   10:28 AM 07/01/2022    8:59 AM  GAD 7 : Generalized Anxiety Score  Nervous, Anxious, on Edge 1 1 1  0  Control/stop worrying 0 0 1 0  Worry too much - different things 1 0 1 0  Trouble relaxing 1 1 1 1   Restless 2 1 1  0  Easily annoyed or irritable 1 0 0 0  Afraid - awful might happen 0 0 0 0  Total GAD 7 Score 6 3 5 1   Anxiety Difficulty  Not difficult at all      Social History   Tobacco Use   Smoking status: Former    Current packs/day: 0.00    Average packs/day: 1 pack/day for 25.0 years (25.0 ttl pk-yrs)    Types: Cigarettes    Start date: 81    Quit date: 2007    Years since quitting: 18.0    Passive exposure: Never   Smokeless tobacco: Former  Building Services Engineer status: Never Used  Substance Use Topics   Alcohol use: Yes    Comment: very rarely   Drug use: Never    Review of Systems  Per HPI unless specifically indicated above     Objective:    BP (!) 140/90 (BP Location: Left Arm, Cuff Size: Normal)   Pulse 77   Ht 5' 0.3 (1.532 m)   Wt 223 lb (101.2 kg)   SpO2 100%   BMI 43.12 kg/m   Wt Readings from Last 3 Encounters:  03/07/23 223 lb (101.2 kg)  02/09/23 221 lb (100.2 kg)  01/20/23 216 lb (98 kg)    Physical Exam Vitals and nursing note reviewed.  Constitutional:      General: She is not in acute distress.    Appearance: Normal appearance. She is well-developed. She is not diaphoretic.     Comments: Well-appearing, comfortable, cooperative  HENT:     Head: Normocephalic and atraumatic.  Eyes:     General:        Right eye: No discharge.        Left eye: No discharge.     Conjunctiva/sclera: Conjunctivae normal.  Cardiovascular:     Rate and Rhythm: Normal rate.  Pulmonary:     Effort: Pulmonary effort is normal.   Skin:    General: Skin is warm and dry.     Findings: No erythema or rash.  Neurological:     Mental Status: She is alert and oriented to person, place, and time.  Psychiatric:        Mood and Affect: Mood normal.        Behavior: Behavior normal.        Thought Content: Thought content normal.     Comments: Well groomed, good eye contact, normal speech and thoughts     CLINICAL DATA:  Inguinal lymphadenopathy.   EXAM: CT ABDOMEN AND PELVIS WITH CONTRAST   TECHNIQUE: Multidetector CT imaging of the abdomen and pelvis was performed using the standard protocol following bolus administration of intravenous contrast.   RADIATION DOSE REDUCTION: This exam was performed according to the departmental dose-optimization program which includes automated exposure control, adjustment of the mA and/or kV according to patient size and/or use of iterative reconstruction technique.   CONTRAST:  OMNIPAQUE  IOHEXOL  300 MG/ML  SOLN   COMPARISON:  None Available.   FINDINGS: Lower chest: The lung bases are clear of acute process. No pleural effusion or pulmonary lesions. The heart is normal in size. No pericardial effusion. The distal esophagus and aorta are unremarkable. Pacer wires are noted.   Hepatobiliary: No hepatic lesions or intrahepatic biliary dilatation. The gallbladder is normal. No common bile duct dilatation.   Pancreas: No mass, inflammation or ductal dilatation.   Spleen: Normal size.  No focal lesions.   Adrenals/Urinary Tract: 10 mm left adrenal gland nodule is indeterminate but most likely a benign adenoma. Recommend follow-up noncontrast abdominal CT scan in 6 months to document stability.   No renal, ureteral or bladder calculi or mass.   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 No complicating features are identified. The small bowel and colon are unremarkable. No inflammatory changes, mass lesions or obstructive findings.    Vascular/Lymphatic: Scattered atherosclerotic calcifications involving the aorta but no aneurysm dissection. The major venous structures are patent. No mesenteric or retroperitoneal mass or adenopathy. Small scattered lymph nodes are noted.   Few small scattered sub 8 mm pelvic lymph nodes are noted but no overt adenopathy the. Borderline enlarged inguinal lymph nodes. Largest right inguinal node on image 90/2 measures 14 mm and left inguinal node measures 12 mm. Fairly normal appearing lymph node architecture. Recommend clinical surveillance.  Lymph node biopsy is also an option if clinical concern.   Reproductive: The uterus and ovaries are unremarkable.   Other: Small periumbilical abdominal wall hernia containing fat. No ascites. No subcutaneous lesions.   Musculoskeletal: Surgical fusion hardware noted without complicating features. No acute pulmonary findings or worrisome bone lesions.   IMPRESSION: 1. Borderline enlarged inguinal lymph nodes. Recommend clinical surveillance or ymph node biopsy if clinical concern. No abdominal/pelvic lymphadenopathy. 2. 10 mm left adrenal gland nodule is indeterminate but most likely a benign adenoma. Recommend follow-up noncontrast abdominal CT scan in 6 months to document stability. 3. 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 No complicating features are identified. 4. Small periumbilical abdominal hernia containing fat. 5. Aortic atherosclerosis.   Aortic Atherosclerosis (ICD10-I70.0).     Electronically Signed   By: MYRTIS Stammer M.D.   On: 02/09/2023 12:04  Results for orders placed or performed in visit on 03/07/23  POCT HgB A1C   Collection Time: 03/07/23  9:01 AM  Result Value Ref Range   Hemoglobin A1C 5.6 4.0 - 5.6 %   HbA1c POC (<> result, manual entry)     HbA1c, POC (prediabetic range)     HbA1c, POC (controlled diabetic range)        Assessment & Plan:   Problem List Items Addressed This Visit      Atherosclerosis of left carotid artery   Relevant Medications   REPATHA  SURECLICK 140 MG/ML SOAJ   valsartan  (DIOVAN ) 80 MG tablet   Drug-induced myopathy   Gastroesophageal reflux disease   Relevant Medications   pantoprazole  (PROTONIX ) 40 MG tablet   Restless leg syndrome   Relevant Medications   pramipexole  (MIRAPEX ) 1 MG tablet   Type 2 diabetes mellitus with other specified complication (HCC) - Primary   Relevant Medications   valsartan  (DIOVAN ) 80 MG tablet   Other Relevant Orders   POCT HgB A1C (Completed)   Other Visit Diagnoses       Cervical lymphadenopathy       Relevant Orders   US  Soft Tissue Head/Neck (NON-THYROID )     Aortic atherosclerosis (HCC)       Relevant Medications   REPATHA  SURECLICK 140 MG/ML SOAJ   valsartan  (DIOVAN ) 80 MG tablet     Essential hypertension       Relevant Medications   REPATHA  SURECLICK 140 MG/ML SOAJ   valsartan  (DIOVAN ) 80 MG tablet     Chronic left-sided low back pain with left-sided sciatica       Relevant Medications   pramipexole  (MIRAPEX ) 1 MG tablet   traMADol  (ULTRAM ) 50 MG tablet        Lymphadenopathy Neck / Inguinal Recurrent lymphadenopathy with fluctuating size. No biopsy performed yet. Reviewed CT Abd Pelvis imaging, not consistent with concerning LAD requiring biopsy -Order ultrasound of the neck to evaluate for possible biopsy.  Sleep Disturbance Reports of worsening sleep, possibly related to new medication. -Plan to discuss with neurologist at upcoming appointment on 03/15/2023.  Hypertension Reports of inconsistent use of metoprolol . -Advise to restart metoprolol  and maintain consistent use.  Aortic Atherosclerosis Pending insurance approval for Repatha . -Switch prescription to [facility].  Gastroesophageal Reflux Disease (GERD) Reports of worsening heartburn. -Resume pantoprazole  once daily and order 90-day supply.  Restless Leg Syndrome Controlled on Pramipexole  1mg  -Switch prescription to  [facility].  Colon Cancer Screening Colonoscopy scheduled for March 2025. -No further action needed at this time.  Medication Management Multiple medications need to be switched to [facility]. -Switch all prescriptions to  [facility].  Follow-up Yearly appointment due in October 2025. -Plan to follow-up as needed or in October 2025.         Orders Placed This Encounter  Procedures   US  Soft Tissue Head/Neck (NON-THYROID )    Standing Status:   Future    Expiration Date:   03/06/2024    Reason for Exam (SYMPTOM  OR DIAGNOSIS REQUIRED):   recurrent anterior and posterior cervical lymphadenopathy, also has pelvic LAD    Preferred imaging location?:   [facility]   POCT HgB A1C    Meds ordered this encounter  Medications   pantoprazole  (PROTONIX ) 40 MG tablet    Sig: Take 1 tablet (40 mg total) by mouth daily before breakfast.    Dispense:  90 tablet    Refill:  3   pramipexole  (MIRAPEX ) 1 MG tablet    Sig: Take 1 tablet (1 mg total) by mouth 2 (two) times daily.    Dispense:  180 tablet    Refill:  3   REPATHA  SURECLICK 140 MG/ML SOAJ    Sig: Inject 140 mg into the skin every 14 (fourteen) days.    Dispense:  2 mL    Refill:  5   traMADol  (ULTRAM ) 50 MG tablet    Sig: Take 1 tablet (50 mg total) by mouth every 8 (eight) hours as needed for moderate pain (pain score 4-6). 2-3 times daily PRN    Dispense:  90 tablet    Refill:  3   valsartan  (DIOVAN ) 80 MG tablet    Sig: Take 1 tablet (80 mg total) by mouth daily.    Dispense:  90 tablet    Refill:  3    Follow up plan: Return if symptoms worsen or fail to improve.   [name], DO [facility] 03/07/2023, 8:56 AM

## 2023-03-07 NOTE — Patient Instructions (Addendum)
 Thank you for coming to the office today.  Neck ultrasound for lymph nodes, if anything concering we can refer to biopsy  [facility]  Refilled pain med  Restart Metoprolol   Recent Labs    12/02/22 1104 03/07/23 0901  HGBA1C 6.2* 5.6     Please schedule a Follow-up Appointment to: Return if symptoms worsen or fail to improve.  If you have any other questions or concerns, please feel free to call the office or send a message through MyChart. You may also schedule an earlier appointment if necessary.  Additionally, you may be receiving a survey about your experience at our office within a few days to 1 week by e-mail or mail. We value your feedback.  [name], DO [facility] [place], [place]

## 2023-03-08 ENCOUNTER — Ambulatory Visit
Admission: RE | Admit: 2023-03-08 | Discharge: 2023-03-08 | Disposition: A | Discharge status: Home / Self Care | Payer: Medicare HMO | Source: Ambulatory Visit | Attending: Family Medicine | Admitting: Family Medicine

## 2023-03-08 DIAGNOSIS — Z780 Asymptomatic menopausal state: Secondary | ICD-10-CM | POA: Diagnosis not present

## 2023-03-13 ENCOUNTER — Ambulatory Visit
Admission: RE | Admit: 2023-03-13 | Discharge: 2023-03-13 | Disposition: A | Discharge status: Home / Self Care | Payer: Medicare HMO | Source: Ambulatory Visit | Attending: Family Medicine | Admitting: Family Medicine

## 2023-03-13 DIAGNOSIS — R590 Localized enlarged lymph nodes: Secondary | ICD-10-CM | POA: Diagnosis not present

## 2023-03-15 DIAGNOSIS — R202 Paresthesia of skin: Secondary | ICD-10-CM | POA: Diagnosis not present

## 2023-03-15 DIAGNOSIS — R29898 Other symptoms and signs involving the musculoskeletal system: Secondary | ICD-10-CM | POA: Diagnosis not present

## 2023-03-15 DIAGNOSIS — G629 Polyneuropathy, unspecified: Secondary | ICD-10-CM | POA: Diagnosis not present

## 2023-03-15 DIAGNOSIS — R200 Anesthesia of skin: Secondary | ICD-10-CM | POA: Diagnosis not present

## 2023-03-15 DIAGNOSIS — G2581 Restless legs syndrome: Secondary | ICD-10-CM | POA: Diagnosis not present

## 2023-03-16 ENCOUNTER — Encounter: Payer: Self-pay | Admitting: Family Medicine

## 2023-03-24 NOTE — Telephone Encounter (Unsigned)
Copied from CRM [id number]. Topic: General - Other >> Mar 24, 2023  8:39 AM Everette C wrote: Reason for CRM: The patient would like to be contacted by a member of staff when possible to review their recent labs/findings  Please contact the patient when possible to follow up

## 2023-04-05 DIAGNOSIS — R200 Anesthesia of skin: Secondary | ICD-10-CM | POA: Diagnosis not present

## 2023-04-14 ENCOUNTER — Encounter: Payer: Self-pay | Admitting: Family Medicine

## 2023-04-14 ENCOUNTER — Ambulatory Visit: Payer: Medicare HMO | Admitting: Family Medicine

## 2023-04-14 VITALS — BP 120/70 | HR 72 | Ht 60.3 in | Wt 230.0 lb

## 2023-04-14 DIAGNOSIS — K2950 Unspecified chronic gastritis without bleeding: Secondary | ICD-10-CM

## 2023-04-14 DIAGNOSIS — R599 Enlarged lymph nodes, unspecified: Secondary | ICD-10-CM

## 2023-04-14 DIAGNOSIS — Z8719 Personal history of other diseases of the digestive system: Secondary | ICD-10-CM | POA: Diagnosis not present

## 2023-04-14 DIAGNOSIS — R590 Localized enlarged lymph nodes: Secondary | ICD-10-CM | POA: Diagnosis not present

## 2023-04-14 DIAGNOSIS — K219 Gastro-esophageal reflux disease without esophagitis: Secondary | ICD-10-CM

## 2023-04-14 MED ORDER — PANTOPRAZOLE SODIUM 40 MG PO TBEC: 40.0000 mg | DELAYED_RELEASE_TABLET | Freq: Two times a day (BID) | ORAL | 1 refills | Status: DC

## 2023-04-14 MED ORDER — SUCRALFATE 1 G PO TABS: 1.0000 g | ORAL_TABLET | Freq: Three times a day (TID) | ORAL | 1 refills | Status: DC

## 2023-04-14 NOTE — Progress Notes (Signed)
Subjective:    Patient ID: Janet [name], female    DOB: 1950/03/06, 73 y.o.   MRN: [id number]  Janet [name] is a 73 y.o. female presenting on 04/14/2023 for Abdominal Pain, Gastroesophageal Reflux, and Lymphadenopathy   HPI  Discussed the use of AI scribe software for clinical note transcription with the patient, who gave verbal consent to proceed.  History of Present Illness   Janet Kush "Janet [name]" is a 73 year old female with a history of esophageal dilation who presents with abdominal pain and weight gain.  She experiences significant abdominal pain described as a burning sensation in the epigastric region. The pain is persistent and improves with eating, leading to weight gain as she eats to alleviate the discomfort. No cramping is noted, and the pain sometimes presents as burning. She has a history of heartburn and suspects it might be related to acid reflux, although she has not experienced this for a long time. She is currently taking pantoprazole 40 mg every morning but is unsure of its effectiveness as her symptoms persist, particularly worsening in the evening.  She has a history of esophageal dilation procedures due to esophageal strictures, having undergone them multiple times in the past. These procedures were performed in [place] and [place], with the last one occurring approximately four years ago. Her esophagus was previously dilated every several years due to recurrent narrowing.  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 These nodes 'keep coming back' and have been extensively scanned in the past, with CT abdomen and Neck US. She also notes a history of umbilical hernia, with a node close to this area.          02/09/2023    9:29 AM 01/20/2023    2:20 PM 12/02/2022   10:27 AM  Depression screen PHQ 2/9  Decreased Interest 1 1 0  Down, Depressed, Hopeless 1 1 1   PHQ - 2 Score  2 2 1   Altered sleeping 3 3 3   Tired, decreased energy 1 1 1   Change in appetite 0 1 0  Feeling bad or failure about yourself  1 0 1  Trouble concentrating 1 1 1   Moving slowly or fidgety/restless 0 0 0  Suicidal thoughts 0 0 1  PHQ-9 Score 8 8 8   Difficult doing work/chores  Somewhat difficult Somewhat difficult       02/09/2023    9:29 AM 01/20/2023    2:20 PM 12/02/2022   10:28 AM 07/01/2022    8:59 AM  GAD 7 : Generalized Anxiety Score  Nervous, Anxious, on Edge 1 1 1  0  Control/stop worrying 0 0 1 0  Worry too much - different things 1 0 1 0  Trouble relaxing 1 1 1 1   Restless 2 1 1  0  Easily annoyed or irritable 1 0 0 0  Afraid - awful might happen 0 0 0 0  Total GAD 7 Score 6 3 5 1   Anxiety Difficulty  Not difficult at all      Social History   Tobacco Use   Smoking status: Former    Current packs/day: 0.00    Average packs/day: 1 pack/day for 25.0 years (25.0 ttl pk-yrs)    Types: Cigarettes    Start date: 54    Quit date: 2007    Years since quitting: 18.1    Passive exposure: Never   Smokeless tobacco: Former  Advertising account planner  Vaping status: Never Used  Substance Use Topics   Alcohol use: Yes    Comment: very rarely   Drug use: Never    Review of Systems Per HPI unless specifically indicated above     Objective:    BP 120/70   Pulse 72   Ht 5' 0.3" (1.532 m)   Wt 230 lb (104.3 kg)   SpO2 99%   BMI 44.47 kg/m   Wt Readings from Last 3 Encounters:  04/14/23 230 lb (104.3 kg)  03/07/23 223 lb (101.2 kg)  02/09/23 221 lb (100.2 kg)    Physical Exam Vitals and nursing note reviewed.  Constitutional:      General: She is not in acute distress.    Appearance: She is well-developed. She is obese. She is not diaphoretic.     Comments: Well-appearing, comfortable, cooperative  HENT:     Head: Normocephalic and atraumatic.  Eyes:     General:        Right eye: No discharge.        Left eye: No discharge.     Conjunctiva/sclera: Conjunctivae  normal.  Neck:     Thyroid: No thyromegaly.  Cardiovascular:     Rate and Rhythm: Normal rate and regular rhythm.     Heart sounds: Normal heart sounds. No murmur heard. Pulmonary:     Effort: Pulmonary effort is normal. No respiratory distress.     Breath sounds: Normal breath sounds. No wheezing or rales.  Musculoskeletal:        General: Normal range of motion.     Cervical back: Normal range of motion and neck supple.  Lymphadenopathy:     Cervical: No cervical adenopathy.     Upper Body:     Left upper body: Axillary adenopathy present.  Skin:    General: Skin is warm and dry.     Findings: No erythema or rash.  Neurological:     Mental Status: She is alert and oriented to person, place, and time.  Psychiatric:        Behavior: Behavior normal.     Comments: Well groomed, good eye contact, normal speech and thoughts    I have personally reviewed the radiology report from 03/13/23 on Neck US.  CLINICAL DATA:  Recurrent cervical lymphadenopathy.   EXAM: ULTRASOUND OF HEAD/NECK SOFT TISSUES   TECHNIQUE: Ultrasound examination of the head and neck soft tissues was performed in the area of clinical concern.   COMPARISON:  None Available.   FINDINGS: Sonographic evaluation of patient's palpable area of concern correlates with non pathologically enlarged bilateral cervical lymph nodes with index left cervical lymph node measuring 0.5 cm in greatest short axis diameter, not enlarged by size criteria though does demonstrate cortical thickening (representative image 14). The remainder of the imaged cervical lymph nodes are benign in appearance. No evidence of suppuration.   IMPRESSION: Sonographic evaluation of patient's palpable area of concern correlates with mildly enlarged though non pathologically enlarged bilateral cervical lymph nodes, presumably reactive in etiology. Clinical correlation to resolution is advised.     Electronically Signed   By: Simonne Come  M.D.   On: 03/18/2023 11:06    I have personally reviewed the radiology report from 02/09/23 on CT Abd Pelvis.   CLINICAL DATA:  Inguinal lymphadenopathy.   EXAM: CT ABDOMEN AND PELVIS WITH CONTRAST   TECHNIQUE: Multidetector CT imaging of the abdomen and pelvis was performed using the standard protocol following bolus administration of intravenous contrast.   RADIATION DOSE  REDUCTION: This exam was performed according to the departmental dose-optimization program which includes automated exposure control, adjustment of the mA and/or kV according to patient size and/or use of iterative reconstruction technique.   CONTRAST:  OMNIPAQUE IOHEXOL 300 MG/ML  SOLN   COMPARISON:  None Available.   FINDINGS: Lower chest: The lung bases are clear of acute process. No pleural effusion or pulmonary lesions. The heart is normal in size. No pericardial effusion. The distal esophagus and aorta are unremarkable. Pacer wires are noted.   Hepatobiliary: No hepatic lesions or intrahepatic biliary dilatation. The gallbladder is normal. No common bile duct dilatation.   Pancreas: No mass, inflammation or ductal dilatation.   Spleen: Normal size.  No focal lesions.   Adrenals/Urinary Tract: 10 mm left adrenal gland nodule is indeterminate but most likely a benign adenoma. Recommend follow-up noncontrast abdominal CT scan in 6 months to document stability.   No renal, ureteral or bladder calculi or mass.   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 No complicating features are identified. The small bowel and colon are unremarkable. No inflammatory changes, mass lesions or obstructive findings.   Vascular/Lymphatic: Scattered atherosclerotic calcifications involving the aorta but no aneurysm dissection. The major venous structures are patent. No mesenteric or retroperitoneal mass or adenopathy. Small scattered lymph nodes are noted.   Few small scattered sub 8  mm pelvic lymph nodes are noted but no overt adenopathy the. Borderline enlarged inguinal lymph nodes. Largest right inguinal node on image 90/2 measures 14 mm and left inguinal node measures 12 mm. Fairly normal appearing lymph node architecture. Recommend clinical surveillance. Lymph node biopsy is also an option if clinical concern.   Reproductive: The uterus and ovaries are unremarkable.   Other: Small periumbilical abdominal wall hernia containing fat. No ascites. No subcutaneous lesions.   Musculoskeletal: Surgical fusion hardware noted without complicating features. No acute pulmonary findings or worrisome bone lesions.   IMPRESSION: 1. Borderline enlarged inguinal lymph nodes. Recommend clinical surveillance or ymph node biopsy if clinical concern. No abdominal/pelvic lymphadenopathy. 2. 10 mm left adrenal gland nodule is indeterminate but most likely a benign adenoma. Recommend follow-up noncontrast abdominal CT scan in 6 months to document stability. 3. 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 No complicating features are identified. 4. Small periumbilical abdominal hernia containing fat. 5. Aortic atherosclerosis.   Aortic Atherosclerosis (ICD10-I70.0).     Electronically Signed   By: Rudie Meyer M.D.   On: 02/09/2023 12:04  Results for orders placed or performed in visit on 03/07/23  POCT HgB A1C   Collection Time: 03/07/23  9:01 AM  Result Value Ref Range   Hemoglobin A1C 5.6 4.0 - 5.6 %   HbA1c POC (<> result, manual entry)     HbA1c, POC (prediabetic range)     HbA1c, POC (controlled diabetic range)        Assessment & Plan:   Problem List Items Addressed This Visit     Gastroesophageal reflux disease - Primary   Relevant Medications   sucralfate (CARAFATE) 1 g tablet   pantoprazole (PROTONIX) 40 MG tablet   Other Relevant Orders   Ambulatory referral to Gastroenterology   Other Visit Diagnoses       Axillary lymphadenopathy        Relevant Orders   MM 3D DIAGNOSTIC MAMMOGRAM BILATERAL BREAST   US LIMITED ULTRASOUND INCLUDING AXILLA LEFT BREAST      Enlarged lymph nodes         Chronic gastritis without bleeding, unspecified  gastritis type       Relevant Medications   sucralfate (CARAFATE) 1 g tablet   Other Relevant Orders   Ambulatory referral to Gastroenterology     History of esophageal stricture       Relevant Orders   Ambulatory referral to Gastroenterology        Epigastric Pain / PUD vs Gastritis Persistent epigastric pain, described as burning, that improves with eating.  History of esophageal stricture with dilatation on EGD in [place] years ago Currently on Pantoprazole 40mg  daily, but symptoms persist.  -Double Pantoprazole dose to 40mg  twice daily. Sent new rx higher count -Add Carafate (sucralfate) as needed for symptom relief. THREE TIMES A DAY + bedtime PRN -Referral to Gastroenterology for further evaluation and possible endoscopy.  Lymphadenopathy Multiple palpable lymph nodes in various locations including chest, axillary, and groin regions She has had CT Abd Pelvis, no pathological nodes. Also US Neck soft tissue, again benign reactive nodes. No biopsy Overdue for mammogram -Order mammogram with ultrasound to evaluate chest and axillary lymph nodes. -Consider biopsy of accessible lymph nodes based on imaging results.    Orders Placed This Encounter  Procedures   MM 3D DIAGNOSTIC MAMMOGRAM BILATERAL BREAST    Standing Status:   Future    Expiration Date:   10/12/2023    Reason for Exam (SYMPTOM  OR DIAGNOSIS REQUIRED):   left axillary lymphadenopathy. Last mammo 2023.    Preferred imaging location?:   [facility]   [place] LIMITED ULTRASOUND INCLUDING AXILLA LEFT BREAST     Standing Status:   Future    Expiration Date:   10/12/2023    Reason for Exam (SYMPTOM  OR DIAGNOSIS REQUIRED):   left axillary lymphadenopathy, last mammo 2023    Preferred imaging location?:   [facility]   Ambulatory referral to Gastroenterology    Referral Priority:   Routine    Referral Type:   Consultation    Referral Reason:   Specialty Services Required    Number of Visits Requested:   1    Meds ordered this encounter  Medications   sucralfate (CARAFATE) 1 g tablet    Sig: Take 1 tablet (1 g total) by mouth 4 (four) times daily -  with meals and at bedtime.    Dispense:  90 tablet    Refill:  1   pantoprazole (PROTONIX) 40 MG tablet    Sig: Take 1 tablet (40 mg total) by mouth 2 (two) times daily before a meal.    Dispense:  180 tablet    Refill:  1    Follow up plan: Return if symptoms worsen or fail to improve.  Saralyn Pilar, DO [facility] 04/14/2023, 4:10 PM

## 2023-04-14 NOTE — Patient Instructions (Addendum)
Thank you for coming to the office today.  Concern the abdominal and esophageal pain is related to gastritis from stomach acid and digestive issues. Similar to the past, you have had esophagus stretched and procedure for scope  We will increase dose - Pantoprazole now take TWICE a day, 40mg , before breakfast and before dinner.  Take the Carafate Sucralfate as needed with or before meals and bedtime. - symptom relief.  Referral to [facility] for further management and endoscopy  [facility] [address] [place], [place] [address] Hours: 8AM-5PM Phone: [phone]  ----  For lymph nodes I would suggest Mammogram and ultrasound and consider biopsy  For Mammogram screening for breast cancer   Call the Imaging Center below anytime to schedule your own appointment now that order has been placed.  [facility] at [facility] [address], [address] [place], [place] [address] Phone: [phone]    Please schedule a Follow-up Appointment to: Return if symptoms worsen or fail to improve.  If you have any other questions or concerns, please feel free to call the office or send a message through MyChart. You may also schedule an earlier appointment if necessary.  Additionally, you may be receiving a survey about your experience at our office within a few days to 1 week by e-mail or mail. We value your feedback.  Saralyn Pilar, DO [facility], [place]

## 2023-04-28 ENCOUNTER — Other Ambulatory Visit: Payer: Self-pay | Admitting: Family Medicine

## 2023-04-28 DIAGNOSIS — G4709 Other insomnia: Secondary | ICD-10-CM

## 2023-04-28 DIAGNOSIS — G2581 Restless legs syndrome: Secondary | ICD-10-CM

## 2023-04-28 DIAGNOSIS — F331 Major depressive disorder, recurrent, moderate: Secondary | ICD-10-CM

## 2023-04-30 DIAGNOSIS — E113292 Type 2 diabetes mellitus with mild nonproliferative diabetic retinopathy without macular edema, left eye: Secondary | ICD-10-CM | POA: Diagnosis not present

## 2023-04-30 DIAGNOSIS — E1151 Type 2 diabetes mellitus with diabetic peripheral angiopathy without gangrene: Secondary | ICD-10-CM | POA: Diagnosis not present

## 2023-04-30 DIAGNOSIS — Z8249 Family history of ischemic heart disease and other diseases of the circulatory system: Secondary | ICD-10-CM | POA: Diagnosis not present

## 2023-04-30 DIAGNOSIS — D6869 Other thrombophilia: Secondary | ICD-10-CM | POA: Diagnosis not present

## 2023-04-30 DIAGNOSIS — I4891 Unspecified atrial fibrillation: Secondary | ICD-10-CM | POA: Diagnosis not present

## 2023-04-30 DIAGNOSIS — E1159 Type 2 diabetes mellitus with other circulatory complications: Secondary | ICD-10-CM | POA: Diagnosis not present

## 2023-04-30 DIAGNOSIS — Z008 Encounter for other general examination: Secondary | ICD-10-CM | POA: Diagnosis not present

## 2023-04-30 DIAGNOSIS — K219 Gastro-esophageal reflux disease without esophagitis: Secondary | ICD-10-CM | POA: Diagnosis not present

## 2023-04-30 DIAGNOSIS — I700 Atherosclerosis of aorta: Secondary | ICD-10-CM | POA: Diagnosis not present

## 2023-04-30 DIAGNOSIS — E785 Hyperlipidemia, unspecified: Secondary | ICD-10-CM | POA: Diagnosis not present

## 2023-04-30 DIAGNOSIS — F320 Major depressive disorder, single episode, mild: Secondary | ICD-10-CM | POA: Diagnosis not present

## 2023-04-30 DIAGNOSIS — I495 Sick sinus syndrome: Secondary | ICD-10-CM | POA: Diagnosis not present

## 2023-04-30 DIAGNOSIS — I10 Essential (primary) hypertension: Secondary | ICD-10-CM | POA: Diagnosis not present

## 2023-04-30 LAB — HM DIABETES EYE EXAM

## 2023-05-01 NOTE — Telephone Encounter (Signed)
 Unable to refill per protocol, Rx expired. Discontinued 04/27/23.  Requested Prescriptions  Pending Prescriptions Disp Refills   DULoxetine (CYMBALTA) 30 MG capsule [Pharmacy Med Name: DULoxetine HCl 30 MG Oral Capsule Delayed Release Particles] 90 capsule 3    Sig: TAKE 1 CAPSULE BY MOUTH DAILY     Psychiatry: Antidepressants - SNRI - duloxetine Passed - 05/01/2023  8:38 AM      Passed - Cr in normal range and within 360 days    Creat  Date Value Ref Range Status  01/20/2023 0.76 0.60 - 1.00 mg/dL Final   Creatinine, Urine  Date Value Ref Range Status  09/10/2021 32 20 - 275 mg/dL Final         Passed - eGFR is 30 or above and within 360 days    GFR, Est African American  Date Value Ref Range Status  09/12/2019 107 > OR = 60 mL/min/1.54m2 Final   GFR, Est Non African American  Date Value Ref Range Status  09/12/2019 93 > OR = 60 mL/min/1.42m2 Final   GFR, Estimated  Date Value Ref Range Status  07/08/2022 >60 >60 mL/min Final    Comment:    (NOTE) Calculated using the CKD-EPI Creatinine Equation (2021)    eGFR  Date Value Ref Range Status  01/20/2023 84 > OR = 60 mL/min/1.48m2 Final         Passed - Completed PHQ-2 or PHQ-9 in the last 360 days      Passed - Last BP in normal range    BP Readings from Last 1 Encounters:  04/14/23 120/70         Passed - Valid encounter within last 6 months    Recent Outpatient Visits           1 month ago Type 2 diabetes mellitus with other specified complication, without long-term current use of insulin [facility])   [place] [facility] Smitty Cords, DO   2 months ago Pelvic lymphadenopathy   [place] [facility] Smitty Cords, DO   3 months ago Epigastric pain   [place] [facility] [place], Salvadore Oxford, NP   5 months ago Annual physical exam   [place] [facility] Smitty Cords, DO   10 months ago Type 2 diabetes mellitus  with other specified complication, without long-term current use of insulin [facility])    [facility] [place], Netta Neat, [place]

## 2023-05-05 ENCOUNTER — Other Ambulatory Visit: Payer: Self-pay | Admitting: Family Medicine

## 2023-05-05 DIAGNOSIS — E1169 Type 2 diabetes mellitus with other specified complication: Secondary | ICD-10-CM

## 2023-05-05 DIAGNOSIS — R591 Generalized enlarged lymph nodes: Secondary | ICD-10-CM | POA: Diagnosis not present

## 2023-05-05 DIAGNOSIS — I10 Essential (primary) hypertension: Secondary | ICD-10-CM

## 2023-05-05 DIAGNOSIS — Z860100 Personal history of colon polyps, unspecified: Secondary | ICD-10-CM | POA: Diagnosis not present

## 2023-05-05 DIAGNOSIS — R1010 Upper abdominal pain, unspecified: Secondary | ICD-10-CM | POA: Diagnosis not present

## 2023-05-05 NOTE — Telephone Encounter (Signed)
 D/C 12/02/22. Requested Prescriptions  Signed Prescriptions Disp Refills   metoprolol tartrate (LOPRESSOR) 25 MG tablet 200 tablet 1    Sig: TAKE 1 TABLET BY MOUTH TWICE  DAILY     Cardiovascular:  Beta Blockers Passed - 05/05/2023  1:39 PM      Passed - Last BP in normal range    BP Readings from Last 1 Encounters:  04/14/23 120/70         Passed - Last Heart Rate in normal range    Pulse Readings from Last 1 Encounters:  04/14/23 72         Passed - Valid encounter within last 6 months    Recent Outpatient Visits           1 month ago Type 2 diabetes mellitus with other specified complication, without long-term current use of insulin [facility])   [place] [facility] [place], Netta Neat, DO   2 months ago Pelvic lymphadenopathy   [place] [facility] Smitty Cords, DO   3 months ago Epigastric pain   [place] [facility] [place], [place], NP   5 months ago Annual physical exam   [facility] Smitty Cords, DO   10 months ago Type 2 diabetes mellitus with other specified complication, without long-term current use of insulin (HCC)   [place] [facility] [place], Netta Neat, [place]              Refused Prescriptions Disp Refills   atorvastatin (LIPITOR) 40 MG tablet [Pharmacy Med Name: Atorvastatin Calcium 40 MG Oral Tablet] 100 tablet 2    Sig: TAKE 1 TABLET BY MOUTH AT  BEDTIME     Cardiovascular:  Antilipid - Statins Failed - 05/05/2023  1:39 PM      Failed - Lipid Panel in normal range within the last 12 months    Cholesterol  Date Value Ref Range Status  12/02/2022 170 <200 mg/dL Final   LDL Cholesterol (Calc)  Date Value Ref Range Status  12/02/2022 99 mg/dL (calc) Final    Comment:    Reference range: <100 . Desirable range <100 mg/dL for primary prevention;   <70 mg/dL for patients with CHD or diabetic patients  with > or = 2 CHD  risk factors. Marland Kitchen LDL-C is now calculated using the Martin-Hopkins  calculation, which is a validated novel method providing  better accuracy than the Friedewald equation in the  estimation of LDL-C.  Horald Pollen et al. Lenox Ahr. 1610;960(45): 2061-2068  (http://education.[employer].com/faq/FAQ164)    HDL  Date Value Ref Range Status  12/02/2022 56 > OR = 50 mg/dL Final   Triglycerides  Date Value Ref Range Status  12/02/2022 66 <150 mg/dL Final         Passed - Patient is not pregnant      Passed - Valid encounter within last 12 months    Recent Outpatient Visits           1 month ago Type 2 diabetes mellitus with other specified complication, without long-term current use of insulin [facility])   [place] [facility], Netta Neat, DO   2 months ago Pelvic lymphadenopathy   [place] [facility] Smitty Cords, DO   3 months ago Epigastric pain    [facility] [place], Salvadore Oxford, NP   5 months ago Annual physical exam   [facility]  [facility] [place], Netta Neat, DO   10 months ago Type 2 diabetes mellitus with other specified complication, without long-term current use of insulin [facility])   [place] [facility] [employer], Netta Neat, [place]

## 2023-05-05 NOTE — Telephone Encounter (Signed)
 Requested Prescriptions  Pending Prescriptions Disp Refills   atorvastatin (LIPITOR) 40 MG tablet [Pharmacy Med Name: Atorvastatin Calcium 40 MG Oral Tablet] 100 tablet 2    Sig: TAKE 1 TABLET BY MOUTH AT  BEDTIME     Cardiovascular:  Antilipid - Statins Failed - 05/05/2023  1:38 PM      Failed - Lipid Panel in normal range within the last 12 months    Cholesterol  Date Value Ref Range Status  12/02/2022 170 <200 mg/dL Final   LDL Cholesterol (Calc)  Date Value Ref Range Status  12/02/2022 99 mg/dL (calc) Final    Comment:    Reference range: <100 . Desirable range <100 mg/dL for primary prevention;   <70 mg/dL for patients with CHD or diabetic patients  with > or = 2 CHD risk factors. Marland Kitchen LDL-C is now calculated using the Martin-Hopkins  calculation, which is a validated novel method providing  better accuracy than the Friedewald equation in the  estimation of LDL-C.  Horald Pollen et al. Lenox Ahr. 4696;295(28): 2061-2068  (http://education.[employer].com/faq/FAQ164)    HDL  Date Value Ref Range Status  12/02/2022 56 > OR = 50 mg/dL Final   Triglycerides  Date Value Ref Range Status  12/02/2022 66 <150 mg/dL Final         Passed - Patient is not pregnant      Passed - Valid encounter within last 12 months    Recent Outpatient Visits           1 month ago Type 2 diabetes mellitus with other specified complication, without long-term current use of insulin [facility])   [place] [facility] Smitty Cords, DO   2 months ago Pelvic lymphadenopathy   [place] [facility] Smitty Cords, DO   3 months ago Epigastric pain   [place] [facility] [place], Salvadore Oxford, NP   5 months ago Annual physical exam   [place] [facility] Smitty Cords, DO   10 months ago Type 2 diabetes mellitus with other specified complication, without long-term current use of insulin (HCC)   [place]  [facility] [place], Netta Neat, DO               metoprolol tartrate (LOPRESSOR) 25 MG tablet [Pharmacy Med Name: Metoprolol Tartrate 25 MG Oral Tablet] 200 tablet 1    Sig: TAKE 1 TABLET BY MOUTH TWICE  DAILY     Cardiovascular:  Beta Blockers Passed - 05/05/2023  1:38 PM      Passed - Last BP in normal range    BP Readings from Last 1 Encounters:  04/14/23 120/70         Passed - Last Heart Rate in normal range    Pulse Readings from Last 1 Encounters:  04/14/23 72         Passed - Valid encounter within last 6 months    Recent Outpatient Visits           1 month ago Type 2 diabetes mellitus with other specified complication, without long-term current use of insulin [facility])   [place] [facility] [place], Netta Neat, DO   2 months ago Pelvic lymphadenopathy   [place] [facility] Smitty Cords, DO   3 months ago Epigastric pain   [place] [facility] [place], Salvadore Oxford, NP   5 months ago Annual physical exam   [facility] Smitty Cords, DO   10 months ago Type 2 diabetes mellitus with other specified complication, without long-term current use of insulin [facility])   [place] [facility], Netta Neat, [place]

## 2023-05-08 ENCOUNTER — Inpatient Hospital Stay: Attending: Oncology | Admitting: Oncology

## 2023-05-08 ENCOUNTER — Inpatient Hospital Stay

## 2023-05-08 ENCOUNTER — Encounter: Payer: Self-pay | Admitting: Oncology

## 2023-05-08 VITALS — BP 145/76 | HR 75 | Temp 97.2°F | Resp 18 | Wt 228.9 lb

## 2023-05-08 DIAGNOSIS — K9589 Other complications of other bariatric procedure: Secondary | ICD-10-CM | POA: Diagnosis not present

## 2023-05-08 DIAGNOSIS — Z79899 Other long term (current) drug therapy: Secondary | ICD-10-CM | POA: Diagnosis not present

## 2023-05-08 DIAGNOSIS — K219 Gastro-esophageal reflux disease without esophagitis: Secondary | ICD-10-CM | POA: Insufficient documentation

## 2023-05-08 DIAGNOSIS — M810 Age-related osteoporosis without current pathological fracture: Secondary | ICD-10-CM | POA: Insufficient documentation

## 2023-05-08 DIAGNOSIS — I872 Venous insufficiency (chronic) (peripheral): Secondary | ICD-10-CM

## 2023-05-08 DIAGNOSIS — E785 Hyperlipidemia, unspecified: Secondary | ICD-10-CM | POA: Diagnosis not present

## 2023-05-08 DIAGNOSIS — Z7982 Long term (current) use of aspirin: Secondary | ICD-10-CM | POA: Insufficient documentation

## 2023-05-08 DIAGNOSIS — I700 Atherosclerosis of aorta: Secondary | ICD-10-CM | POA: Insufficient documentation

## 2023-05-08 DIAGNOSIS — R61 Generalized hyperhidrosis: Secondary | ICD-10-CM | POA: Diagnosis not present

## 2023-05-08 DIAGNOSIS — E1151 Type 2 diabetes mellitus with diabetic peripheral angiopathy without gangrene: Secondary | ICD-10-CM | POA: Diagnosis not present

## 2023-05-08 DIAGNOSIS — K439 Ventral hernia without obstruction or gangrene: Secondary | ICD-10-CM | POA: Insufficient documentation

## 2023-05-08 DIAGNOSIS — R591 Generalized enlarged lymph nodes: Secondary | ICD-10-CM

## 2023-05-08 DIAGNOSIS — B372 Candidiasis of skin and nail: Secondary | ICD-10-CM | POA: Insufficient documentation

## 2023-05-08 DIAGNOSIS — Z87891 Personal history of nicotine dependence: Secondary | ICD-10-CM | POA: Diagnosis not present

## 2023-05-08 DIAGNOSIS — D509 Iron deficiency anemia, unspecified: Secondary | ICD-10-CM | POA: Diagnosis not present

## 2023-05-08 DIAGNOSIS — Z9884 Bariatric surgery status: Secondary | ICD-10-CM | POA: Diagnosis not present

## 2023-05-08 DIAGNOSIS — R590 Localized enlarged lymph nodes: Secondary | ICD-10-CM | POA: Diagnosis present

## 2023-05-08 DIAGNOSIS — R011 Cardiac murmur, unspecified: Secondary | ICD-10-CM | POA: Insufficient documentation

## 2023-05-08 DIAGNOSIS — G2581 Restless legs syndrome: Secondary | ICD-10-CM | POA: Diagnosis not present

## 2023-05-08 DIAGNOSIS — D508 Other iron deficiency anemias: Secondary | ICD-10-CM

## 2023-05-08 DIAGNOSIS — I10 Essential (primary) hypertension: Secondary | ICD-10-CM | POA: Diagnosis not present

## 2023-05-08 LAB — CBC WITH DIFFERENTIAL/PLATELET
Abs Immature Granulocytes: 0.03 10*3/uL (ref 0.00–0.07)
Basophils Absolute: 0 10*3/uL (ref 0.0–0.1)
Basophils Relative: 0 %
Eosinophils Absolute: 0.4 10*3/uL (ref 0.0–0.5)
Eosinophils Relative: 5 %
HCT: 40.8 % (ref 36.0–46.0)
Hemoglobin: 12.7 g/dL (ref 12.0–15.0)
Immature Granulocytes: 0 %
Lymphocytes Relative: 32 %
Lymphs Abs: 2.4 10*3/uL (ref 0.7–4.0)
MCH: 28.3 pg (ref 26.0–34.0)
MCHC: 31.1 g/dL (ref 30.0–36.0)
MCV: 90.9 fL (ref 80.0–100.0)
Monocytes Absolute: 0.7 10*3/uL (ref 0.1–1.0)
Monocytes Relative: 9 %
Neutro Abs: 4.2 10*3/uL (ref 1.7–7.7)
Neutrophils Relative %: 54 %
Platelets: 292 10*3/uL (ref 150–400)
RBC: 4.49 MIL/uL (ref 3.87–5.11)
RDW: 16.6 % — ABNORMAL HIGH (ref 11.5–15.5)
WBC: 7.7 10*3/uL (ref 4.0–10.5)
nRBC: 0 % (ref 0.0–0.2)

## 2023-05-08 LAB — HEPATITIS PANEL, ACUTE
HCV Ab: NONREACTIVE
Hep A IgM: NONREACTIVE
Hep B C IgM: NONREACTIVE
Hepatitis B Surface Ag: NONREACTIVE

## 2023-05-08 LAB — COMPREHENSIVE METABOLIC PANEL
ALT: 19 U/L (ref 0–44)
AST: 20 U/L (ref 15–41)
Albumin: 4.1 g/dL (ref 3.5–5.0)
Alkaline Phosphatase: 90 U/L (ref 38–126)
Anion gap: 9 (ref 5–15)
BUN: 23 mg/dL (ref 8–23)
CO2: 26 mmol/L (ref 22–32)
Calcium: 8.8 mg/dL — ABNORMAL LOW (ref 8.9–10.3)
Chloride: 103 mmol/L (ref 98–111)
Creatinine, Ser: 0.71 mg/dL (ref 0.44–1.00)
GFR, Estimated: >60 mL/min (ref >60)
Glucose, Bld: 101 mg/dL — ABNORMAL HIGH (ref 70–99)
Potassium: 3.8 mmol/L (ref 3.5–5.1)
Sodium: 138 mmol/L (ref 135–145)
Total Bilirubin: 0.8 mg/dL (ref 0.0–1.2)
Total Protein: 7.6 g/dL (ref 6.5–8.1)

## 2023-05-08 LAB — LACTATE DEHYDROGENASE: LDH: 198 U/L — ABNORMAL HIGH (ref 98–192)

## 2023-05-08 LAB — HIV ANTIBODY (ROUTINE TESTING W REFLEX): HIV Screen 4th Generation wRfx: NONREACTIVE

## 2023-05-08 MED ORDER — NYSTATIN 100000 UNIT/GM EX POWD: 1.0000 | Freq: Three times a day (TID) | CUTANEOUS | 1 refills | Status: DC

## 2023-05-08 NOTE — Assessment & Plan Note (Signed)
 Recommend nystatin powder topical 3 times a day.  Prescription sent to pharmacy

## 2023-05-08 NOTE — Progress Notes (Signed)
 Hematology/Oncology Consult note [phone] [phone]        REFERRING PROVIDER: Freda Munro*   CHIEF COMPLAINTS/REASON FOR VISIT:  Evaluation of lymphadenopathy   ASSESSMENT & PLAN:   Lymphadenopathy Bilateral inguinal lymphadenopathy, likely reactive due to bilateral lower extremity venous insufficiency, skin dermatitis changes. Nonpathologically enlarged cervical lymphadenopathy.  Likely reactive. Night sweats, unclear etiology. I will check CBC, LDH, protein electrophoresis, flow cytometry, HIV, hepatitis, CMP. Obtain PET scan for evaluation of lymphadenopathy metabolic activity.  Venous insufficiency of both lower extremities Recommend leg elevation, compression stocking  Skin candidiasis Recommend nystatin powder topical 3 times a day.  Prescription sent to pharmacy  Iron deficiency anemia following bariatric surgery She has been recently started on iron supplementation.  Plan to recheck iron panel in the next few months if no improvement consider IV iron treatments.   Orders Placed This Encounter  Procedures   NM PET Image Initial (PI) Skull Base To Thigh    Standing Status:   Future    Expected Date:   05/15/2023    Expiration Date:   05/07/2024    If indicated for the ordered procedure, I authorize the administration of a radiopharmaceutical per Radiology protocol:   Yes    Preferred imaging location?:   [facility]   CBC with Differential/Platelet    Standing Status:   Future    Number of Occurrences:   1    Expected Date:   05/08/2023    Expiration Date:   05/07/2024   Protein electrophoresis, serum    Standing Status:   Future    Number of Occurrences:   1    Expected Date:   05/08/2023    Expiration Date:   05/07/2024   Flow cytometry panel-leukemia/lymphoma work-up    Standing Status:   Future    Number of Occurrences:   1    Expected Date:   05/08/2023    Expiration Date:   05/07/2024   Lactate dehydrogenase     Standing Status:   Future    Number of Occurrences:   1    Expected Date:   05/08/2023    Expiration Date:   05/07/2024   HIV Antibody (routine testing w rflx)    Standing Status:   Future    Number of Occurrences:   1    Expected Date:   05/08/2023    Expiration Date:   05/07/2024   Hepatitis panel, acute    Standing Status:   Future    Number of Occurrences:   1    Expected Date:   05/08/2023    Expiration Date:   05/07/2024   Comprehensive metabolic panel    Standing Status:   Future    Number of Occurrences:   1    Expected Date:   05/08/2023    Expiration Date:   05/07/2024   Follow-up after PET scan. All questions were answered. The patient knows to call the clinic with any problems, questions or concerns.  Rickard Patience, MD, PhD [facility] Hematology Oncology 05/08/2023   HISTORY OF PRESENTING ILLNESS:   Janet [name] is a  73 y.o.  female with PMH listed below was seen in consultation at the request of  Freda Munro*  for evaluation of lymphadenopathy  She discovered small round mass in her groin while applying lotion, which led to further investigation.  02/09/2023 CT abdomen pelvis with contrast showed borderline enlarged inguinal lymph nodes.  10 mm left adrenal gland nodule which is  indeterminate. Later she also felt a lymph node in her neck, ultrasound neck showed nonpathologically enlarged bilateral cervical lymph nodes.  She experiences night sweats two to three times a week, lasting about half an hour, but does not require changing clothes. No unintentional weight loss, and she attributes her stable weight to frequent eating due to severe acid reflux.  She has a history of gastric bypass surgery, which affects her nutritional absorption. She is currently taking iron and B12 supplements due to deficiencies, which are likely related to her gastric bypass surgery. She has been on iron supplements for two weeks and has been receiving B12 for a longer period.  She has a  history of venous insufficiency, which she believes contributes to chronic inflammation and dermatitis in her lower extremities. This condition causes redness and inflammation, and she experiences chronic dermatitis and skin rashes. She is originally [place] and has taught her children to cook traditional foods.    MEDICAL HISTORY:  Past Medical History:  Diagnosis Date   Anxiety    a.) on BZO (clonazepam) PRN   Cervical spinal stenosis    Depression    Diastolic dysfunction    a.)  TTE 05/2021: EF 90%, mild LVH, trivial MR/TR/PR, G1DD   Esophageal reflux    Esophageal stricture    a.) s/p multiple (2012, 2015, 2017, 2020) dilitation procedures   GERD (gastroesophageal reflux disease)    Heart murmur    Hyperlipidemia    Hypertension    Insomnia    Osteoarthritis    Osteoporosis    Other secondary kyphosis, cervical region    PAD (peripheral artery disease) (HCC)    PONV (postoperative nausea and vomiting)    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 Jude device   RLS (restless legs syndrome)    a.) takes pramipexole   Sleep apnea    SOB (shortness of breath)    T2DM (type 2 diabetes mellitus) (HCC)    Urinary incontinence    Venous insufficiency of both lower extremities     SURGICAL HISTORY: Past Surgical History:  Procedure Laterality Date   ANTERIOR CERVICAL DECOMP/DISCECTOMY FUSION N/A 11/03/2021   Procedure: C4-7 ANTERIOR CERVICAL DISCECTOMY AND FUSION (GLOBUS FORGE);  Surgeon: [name], MD;  Location: [facility];  Service: Neurosurgery;  Laterality: N/A;   ANTERIOR LATERAL LUMBAR FUSION WITH PERCUTANEOUS SCREW 2 LEVEL N/A 01/26/2022   Procedure: L3-5 LATERAL LUMBAR INTERBODY FUSION WITH POSTERIOR SPINAL FUSION;  Surgeon: [name], MD;  Location: [facility];  Service: Neurosurgery;  Laterality: N/A;   APPENDECTOMY  2014   APPLICATION OF INTRAOPERATIVE CT SCAN N/A 01/26/2022   Procedure: APPLICATION OF INTRAOPERATIVE CT SCAN;   Surgeon: [name], MD;  Location: [facility];  Service: Neurosurgery;  Laterality: N/A;   CARPAL TUNNEL RELEASE Left 1998   ESOPHAGEAL DILATION  2012   2015, 2017, 2020   GASTRIC BYPASS  2019   HERNIA REPAIR  2019   left knee manipulation Left 02/05/2019   PACEMAKER IMPLANT  05/2015   Procedure: PACEMAKER INSERTION; Location: [place]; Surgeon: Maryjean Ka, MD   POLYPECTOMY     SHOULDER SURGERY Left 2014   SHOULDER SURGERY Right 2017   TOTAL KNEE ARTHROPLASTY Right 2018   TOTAL KNEE ARTHROPLASTY Left 10/18/2018   vein removal Bilateral 2012    SOCIAL HISTORY: Social History   Socioeconomic History   Marital status: Married    Spouse name: Zorina Mallin   Number of children: 6   Years of  education: Not on file   Highest education level: Not on file  Occupational History   Occupation: Homemaker  Tobacco Use   Smoking status: Former    Current packs/day: 0.00    Average packs/day: 1 pack/day for 25.0 years (25.0 ttl pk-yrs)    Types: Cigarettes    Start date: 66    Quit date: 2007    Years since quitting: 18.2    Passive exposure: Never   Smokeless tobacco: Former  Building services engineer status: Never Used  Substance and Sexual Activity   Alcohol use: Yes    Comment: very rarely   Drug use: Never   Sexual activity: Not Currently  Other Topics Concern   Not on file  Social History Narrative   Not on file   Social Drivers of Health   Financial Resource Strain: Low Risk  (05/05/2023)   Received from [facility]   Overall Financial Resource Strain (CARDIA)    Difficulty of Paying Living Expenses: Not very hard  Food Insecurity: Food Insecurity Present (05/05/2023)   [facility] from [facility]   Hunger Vital Sign    Worried About Running Out of Food in the Last Year: Sometimes true    Ran Out of Food in the Last Year: Never true  Transportation Needs: No Transportation Needs (05/05/2023)   Received from [facility] - Transportation    In the past 12 months, has lack of transportation kept you from medical appointments or from getting medications?: No    Lack of Transportation (Non-Medical): No  Physical Activity: Inactive (11/18/2022)   Exercise Vital Sign    Days of Exercise per Week: 0 days    Minutes of Exercise per Session: 0 min  Stress: No Stress Concern Present (11/18/2022)   [employer] of Occupational Health - Occupational Stress Questionnaire    Feeling of Stress : Only a little  Social Connections: Moderately Isolated (11/18/2022)   Social Connection and Isolation Panel [NHANES]    Frequency of Communication with Friends and Family: Twice a week    Frequency of Social Gatherings with Friends and Family: Once a week    Attends Religious Services: More than 4 times per year    Active Member of [employer] or Organizations: No    Attends Banker Meetings: Never    Marital Status: Divorced  Catering manager Violence: Not At Risk (02/09/2023)   Humiliation, Afraid, Rape, and Kick questionnaire    Fear of Current or Ex-Partner: No    Emotionally Abused: No    Physically Abused: No    Sexually Abused: No    FAMILY HISTORY: Family History  Problem Relation Age of Onset   Heart attack Mother 49   Heart disease Father    Thyroid disease Father    Skin cancer Father    Brain cancer Maternal Grandmother     ALLERGIES:  is allergic to celebrex [celecoxib].  MEDICATIONS:  Current Outpatient Medications  Medication Sig Dispense Refill   aspirin 81 MG EC tablet Take 81 mg by mouth daily.     metoprolol tartrate (LOPRESSOR) 25 MG tablet TAKE 1 TABLET BY MOUTH TWICE  DAILY 200 tablet 1   nystatin (MYCOSTATIN/NYSTOP) powder Apply 1 Application topically 3 (three) times daily. 30 g 1   pantoprazole (PROTONIX) 40 MG tablet Take 1 tablet (40 mg total) by mouth 2 (two) times daily before a meal. 180 tablet 1   pramipexole (MIRAPEX) 1 MG  tablet Take 1 tablet (1 mg  total) by mouth 2 (two) times daily. 180 tablet 3   sucralfate (CARAFATE) 1 g tablet Take 1 tablet (1 g total) by mouth 4 (four) times daily -  with meals and at bedtime. 90 tablet 1   traMADol (ULTRAM) 50 MG tablet Take 1 tablet (50 mg total) by mouth every 8 (eight) hours as needed for moderate pain (pain score 4-6). 2-3 times daily PRN 90 tablet 3   valsartan (DIOVAN) 80 MG tablet Take 1 tablet (80 mg total) by mouth daily. 90 tablet 3   REPATHA SURECLICK 140 MG/ML SOAJ Inject 140 mg into the skin every 14 (fourteen) days. (Patient not taking: Reported on 05/08/2023) 2 mL 5   No current facility-administered medications for this visit.    Review of Systems  Constitutional:  Positive for fatigue. Negative for appetite change, chills and fever.  HENT:   Negative for hearing loss and voice change.   Eyes:  Negative for eye problems.  Respiratory:  Negative for chest tightness and cough.   Cardiovascular:  Positive for leg swelling. Negative for chest pain.  Gastrointestinal:  Negative for abdominal distention, abdominal pain and blood in stool.  Endocrine: Negative for hot flashes.  Genitourinary:  Negative for difficulty urinating and frequency.   Musculoskeletal:  Negative for arthralgias.  Skin:  Positive for rash. Negative for itching.  Neurological:  Negative for extremity weakness.  Hematological:  Negative for adenopathy.  Psychiatric/Behavioral:  Negative for confusion.    PHYSICAL EXAMINATION: ECOG PERFORMANCE STATUS: 1 - Symptomatic but completely ambulatory Vitals:   05/08/23 1529  BP: (!) 145/76  Pulse: 75  Resp: 18  Temp: (!) 97.2 F (36.2 C)   Filed Weights   05/08/23 1529  Weight: 228 lb 14.4 oz (103.8 kg)    Physical Exam Constitutional:      General: She is not in acute distress.    Appearance: She is obese.  HENT:     Head: Normocephalic and atraumatic.  Eyes:     General: No scleral icterus. Cardiovascular:     Rate and Rhythm: Normal rate and regular  rhythm.     Heart sounds: Normal heart sounds.  Pulmonary:     Effort: Pulmonary effort is normal. No respiratory distress.     Breath sounds: No wheezing.  Abdominal:     General: Bowel sounds are normal. There is no distension.     Palpations: Abdomen is soft.  Musculoskeletal:        General: No deformity. Normal range of motion.     Cervical back: Normal range of motion and neck supple.     Right lower leg: No edema.     Left lower leg: Edema present.  Skin:    General: Skin is warm and dry.     Findings: No erythema or rash.     Comments: Lower abdomen/groin skin folds erythematous changes Bilateral lower extremity venous insufficiency dermatitis  Neurological:     Mental Status: She is alert and oriented to person, place, and time. Mental status is at baseline.  Psychiatric:        Mood and Affect: Mood normal.     LABORATORY DATA:  I have reviewed the data as listed    Latest Ref Rng & Units 05/08/2023    4:02 PM 01/20/2023    2:01 PM 12/02/2022   11:04 AM  CBC  WBC 4.0 - 10.5 K/uL 7.7  8.8  7.1   Hemoglobin 12.0 - 15.0  g/dL 16.1  09.6  04.5   Hematocrit 36.0 - 46.0 % 40.8  40.8  39.9   Platelets 150 - 400 K/uL 292  325  285       Latest Ref Rng & Units 05/08/2023    4:02 PM 01/20/2023    2:01 PM 12/02/2022   11:04 AM  CMP  Glucose 70 - 99 mg/dL 409  90  811   BUN 8 - 23 mg/dL 23  22  13    Creatinine 0.44 - 1.00 mg/dL 9.14  7.82  9.56   Sodium 135 - 145 mmol/L 138  141  141   Potassium 3.5 - 5.1 mmol/L 3.8  4.8  4.5   Chloride 98 - 111 mmol/L 103  105  105   CO2 22 - 32 mmol/L 26  29  30    Calcium 8.9 - 10.3 mg/dL 8.8  9.8  9.2   Total Protein 6.5 - 8.1 g/dL 7.6  7.5  6.9   Total Bilirubin 0.0 - 1.2 mg/dL 0.8  0.7  0.8   Alkaline Phos 38 - 126 U/L 90     AST 15 - 41 U/L 20  32  14   ALT 0 - 44 U/L 19  29  11        RADIOGRAPHIC STUDIES: I have personally reviewed the radiological images as listed and agreed with the findings in the report. [place] Soft  Tissue Head/Neck (NON-THYROID) Result Date: 03/18/2023 CLINICAL DATA:  Recurrent cervical lymphadenopathy. EXAM: ULTRASOUND OF HEAD/NECK SOFT TISSUES TECHNIQUE: Ultrasound examination of the head and neck soft tissues was performed in the area of clinical concern. COMPARISON:  None Available. FINDINGS: Sonographic evaluation of patient's palpable area of concern correlates with non pathologically enlarged bilateral cervical lymph nodes with index left cervical lymph node measuring 0.5 cm in greatest short axis diameter, not enlarged by size criteria though does demonstrate cortical thickening (representative image 14). The remainder of the imaged cervical lymph nodes are benign in appearance. No evidence of suppuration. IMPRESSION: Sonographic evaluation of patient's palpable area of concern correlates with mildly enlarged though non pathologically enlarged bilateral cervical lymph nodes, presumably reactive in etiology. Clinical correlation to resolution is advised. Electronically Signed   By: Simonne Come M.D.   On: 03/18/2023 11:06   DG Bone Density Result Date: 03/08/2023 EXAM: DUAL X-RAY ABSORPTIOMETRY (DXA) FOR BONE MINERAL DENSITY IMPRESSION: Your patient Janet [name] completed a BMD test on 03/08/2023 using the [employer] Advance DXA System (analysis version: 14.10) manufactured by [employer]. The following summarizes the results of our evaluation. Technologist: LCE PATIENT BIOGRAPHICAL: Name: Janet, [name] Patient ID: [id number] Birth Date: 04-18-1950 Height: 61.5 in. Gender: Female Exam Date: 03/08/2023 Weight: 225.0 lbs. Indications: Knee Replacement Left, Knee Replacement Right, POSTmenopausal Fractures: Treatments: ASSESSMENT: The BMD measured at Femur Neck Left is 0.953 g/cm2 with a T-score of -0.6. This patient is considered normal according to World Health Organization [facility]) criteria. L-3 and L-4 were excluded due to surgical hardware. The scan quality is good. Site Region Measured Measured WHO  Young Adult BMD Date       Age      Classification T-score AP Spine L1-L2 5/08/2025 70.1 Normal 0.2 1.185 g/cm2 DualFemur Neck Left 03/08/2023 70.1 Normal -0.6 0.953 g/cm2 Left Forearm Radius 33% 03/08/2023 70.1 Normal 0.8 0.948 g/cm2 World Health Organization [facility]) criteria for post-menopausal, Caucasian Women: Normal:       T-score at or above -1 SD Osteopenia:   T-score between -1 and -2.5 SD Osteoporosis:  T-score at or below -2.5 SD RECOMMENDATIONS: 1. All patients should optimize calcium and vitamin D intake. 2. Consider FDA-approved medical therapies in postmenopausal women and men aged 68 years and older, based on the following: a. A hip or vertebral (clinical or morphometric) fracture b. T-score < -2.5 at the femoral neck or spine after appropriate evaluation to exclude secondary causes c. Low bone mass (T-score between -1.0 and -2.5 at the femoral neck or spine) and a 10-year probability of a hip fracture > 3% or a 10-year probability of a major osteoporosis-related fracture > 20% based on the [place]-adapted WHO algorithm d. Clinician judgment and/or patient preferences may indicate treatment for people with 10-year fracture probabilities above or below these levels FOLLOW-UP: People with diagnosed cases of osteoporosis or at high risk for fracture should have regular bone mineral density tests. For patients eligible for Medicare, routine testing is allowed once every 2 years. The testing frequency can be increased to one year for patients who have rapidly progressing disease, those who are receiving or discontinuing medical therapy to restore bone mass, or have additional risk factors. I have reviewed this report, and agree with the above findings. [facility] Radiology Electronically Signed   By: Romona Curls M.D.   On: 03/08/2023 12:21   CT ABDOMEN PELVIS W CONTRAST Result Date: 02/09/2023 CLINICAL DATA:  Inguinal lymphadenopathy. EXAM: CT ABDOMEN AND PELVIS WITH CONTRAST TECHNIQUE: Multidetector CT  imaging of the abdomen and pelvis was performed using the standard protocol following bolus administration of intravenous contrast. RADIATION DOSE REDUCTION: This exam was performed according to the departmental dose-optimization program which includes automated exposure control, adjustment of the mA and/or kV according to patient size and/or use of iterative reconstruction technique. CONTRAST:  OMNIPAQUE IOHEXOL 300 MG/ML  SOLN COMPARISON:  None Available. FINDINGS: Lower chest: The lung bases are clear of acute process. No pleural effusion or pulmonary lesions. The heart is normal in size. No pericardial effusion. The distal esophagus and aorta are unremarkable. Pacer wires are noted. Hepatobiliary: No hepatic lesions or intrahepatic biliary dilatation. The gallbladder is normal. No common bile duct dilatation. Pancreas: No mass, inflammation or ductal dilatation. Spleen: Normal size.  No focal lesions. Adrenals/Urinary Tract: 10 mm left adrenal gland nodule is indeterminate but most likely a benign adenoma. Recommend follow-up noncontrast abdominal CT scan in 6 months to document stability. No renal, ureteral or bladder calculi or mass. 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 No complicating features are identified. The small bowel and colon are unremarkable. No inflammatory changes, mass lesions or obstructive findings. Vascular/Lymphatic: Scattered atherosclerotic calcifications involving the aorta but no aneurysm dissection. The major venous structures are patent. No mesenteric or retroperitoneal mass or adenopathy. Small scattered lymph nodes are noted. Few small scattered sub 8 mm pelvic lymph nodes are noted but no overt adenopathy the. Borderline enlarged inguinal lymph nodes. Largest right inguinal node on image 90/2 measures 14 mm and left inguinal node measures 12 mm. Fairly normal appearing lymph node architecture. Recommend clinical surveillance. Lymph node biopsy is also an  option if clinical concern. Reproductive: The uterus and ovaries are unremarkable. Other: Small periumbilical abdominal wall hernia containing fat. No ascites. No subcutaneous lesions. Musculoskeletal: Surgical fusion hardware noted without complicating features. No acute pulmonary findings or worrisome bone lesions. IMPRESSION: 1. Borderline enlarged inguinal lymph nodes. Recommend clinical surveillance or ymph node biopsy if clinical concern. No abdominal/pelvic lymphadenopathy. 2. 10 mm left adrenal gland nodule is indeterminate but most likely a benign adenoma. Recommend follow-up  noncontrast abdominal CT scan in 6 months to document stability. 3. 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 No complicating features are identified. 4. Small periumbilical abdominal hernia containing fat. 5. Aortic atherosclerosis. Aortic Atherosclerosis (ICD10-I70.0). Electronically Signed   By: Rudie Meyer M.D.   On: 02/09/2023 12:04

## 2023-05-08 NOTE — Assessment & Plan Note (Signed)
 Recommend leg elevation, compression stocking

## 2023-05-08 NOTE — Assessment & Plan Note (Signed)
 She has been recently started on iron supplementation.  Plan to recheck iron panel in the next few months if no improvement consider IV iron treatments.

## 2023-05-08 NOTE — Assessment & Plan Note (Signed)
 Bilateral inguinal lymphadenopathy, likely reactive due to bilateral lower extremity venous insufficiency, skin dermatitis changes. Nonpathologically enlarged cervical lymphadenopathy.  Likely reactive. Night sweats, unclear etiology. I will check CBC, LDH, protein electrophoresis, flow cytometry, HIV, hepatitis, CMP. Obtain PET scan for evaluation of lymphadenopathy metabolic activity.

## 2023-05-10 ENCOUNTER — Ambulatory Visit
Admission: RE | Admit: 2023-05-10 | Discharge: 2023-05-10 | Disposition: A | Discharge status: Home / Self Care | Payer: Medicare HMO | Source: Ambulatory Visit | Attending: Family Medicine | Admitting: Family Medicine

## 2023-05-10 DIAGNOSIS — R590 Localized enlarged lymph nodes: Secondary | ICD-10-CM | POA: Diagnosis not present

## 2023-05-10 DIAGNOSIS — N6320 Unspecified lump in the left breast, unspecified quadrant: Secondary | ICD-10-CM | POA: Diagnosis not present

## 2023-05-10 DIAGNOSIS — R92323 Mammographic fibroglandular density, bilateral breasts: Secondary | ICD-10-CM | POA: Diagnosis not present

## 2023-05-10 LAB — PROTEIN ELECTROPHORESIS, SERUM
A/G Ratio: 1.4 (ref 0.7–1.7)
Albumin ELP: 4.1 g/dL (ref 2.9–4.4)
Alpha-1-Globulin: 0.2 g/dL (ref 0.0–0.4)
Alpha-2-Globulin: 0.5 g/dL (ref 0.4–1.0)
Beta Globulin: 1.3 g/dL (ref 0.7–1.3)
Gamma Globulin: 1 g/dL (ref 0.4–1.8)
Globulin, Total: 3 g/dL (ref 2.2–3.9)
Total Protein ELP: 7.1 g/dL (ref 6.0–8.5)

## 2023-05-10 LAB — COMP PANEL: LEUKEMIA/LYMPHOMA: Immunophenotypic Profile: 1

## 2023-05-15 ENCOUNTER — Ambulatory Visit
Admission: RE | Admit: 2023-05-15 | Discharge: 2023-05-15 | Disposition: A | Discharge status: Home / Self Care | Source: Ambulatory Visit | Attending: Oncology | Admitting: Oncology

## 2023-05-15 DIAGNOSIS — R591 Generalized enlarged lymph nodes: Secondary | ICD-10-CM | POA: Diagnosis not present

## 2023-05-15 DIAGNOSIS — R590 Localized enlarged lymph nodes: Secondary | ICD-10-CM | POA: Diagnosis not present

## 2023-05-15 LAB — GLUCOSE, CAPILLARY: Glucose-Capillary: 74 mg/dL (ref 70–99)

## 2023-05-15 MED ORDER — FLUDEOXYGLUCOSE F - 18 (FDG) INJECTION
11.8000 | Freq: Once | INTRAVENOUS | Status: AC | PRN
  Administered 2023-05-15: 12.25 via INTRAVENOUS

## 2023-05-16 ENCOUNTER — Telehealth: Payer: Self-pay | Admitting: *Deleted

## 2023-05-16 NOTE — Telephone Encounter (Signed)
 Pt. Called and wanted the results of PET, I told her that it was done but it is not read yet. I told her that sometimes it is taking 10 to 12 days to get it read. She states that she has appt for MD 3/24.   Maybe if it is not read that the yu team may ask radiology to get it read before that date.

## 2023-05-16 NOTE — Telephone Encounter (Signed)
 Thank you. Yes, we will make sure it's ready before she see's Dr.Yu.

## 2023-05-22 ENCOUNTER — Inpatient Hospital Stay (HOSPITAL_BASED_OUTPATIENT_CLINIC_OR_DEPARTMENT_OTHER): Admitting: Oncology

## 2023-05-22 ENCOUNTER — Encounter: Payer: Self-pay | Admitting: Oncology

## 2023-05-22 VITALS — BP 159/80 | HR 87 | Temp 96.1°F | Resp 18 | Wt 230.7 lb

## 2023-05-22 DIAGNOSIS — R591 Generalized enlarged lymph nodes: Secondary | ICD-10-CM

## 2023-05-22 DIAGNOSIS — I872 Venous insufficiency (chronic) (peripheral): Secondary | ICD-10-CM

## 2023-05-22 DIAGNOSIS — D72820 Lymphocytosis (symptomatic): Secondary | ICD-10-CM

## 2023-05-22 DIAGNOSIS — K9589 Other complications of other bariatric procedure: Secondary | ICD-10-CM

## 2023-05-22 DIAGNOSIS — D508 Other iron deficiency anemias: Secondary | ICD-10-CM

## 2023-05-22 DIAGNOSIS — R590 Localized enlarged lymph nodes: Secondary | ICD-10-CM | POA: Diagnosis not present

## 2023-05-23 ENCOUNTER — Encounter: Payer: Self-pay | Admitting: Oncology

## 2023-05-23 DIAGNOSIS — D72820 Lymphocytosis (symptomatic): Secondary | ICD-10-CM | POA: Insufficient documentation

## 2023-05-23 NOTE — Assessment & Plan Note (Signed)
 Recommend leg elevation, compression stocking

## 2023-05-23 NOTE — Assessment & Plan Note (Signed)
 She has been recently started on iron supplementation.  Plan to recheck iron panel in the next few months if no improvement consider IV iron treatments.

## 2023-05-23 NOTE — Assessment & Plan Note (Addendum)
 Bilateral inguinal lymphadenopathy, likely reactive due to bilateral lower extremity venous insufficiency, skin dermatitis changes. Non pathologically enlarged cervical lymphadenopathy.  Likely reactive. Night sweats, unclear etiology. PET scan showed Mildly prominent bilateral inguinal lymph nodes with no significant hypermetabolic activity. Findings favored reactive. Observation.

## 2023-05-23 NOTE — Progress Notes (Signed)
 Hematology/Oncology Consult note [phone] [phone]        REFERRING PROVIDER: Saralyn Pilar *   CHIEF COMPLAINTS/REASON FOR VISIT:  lymphadenopathy   ASSESSMENT & PLAN:   Lymphadenopathy Bilateral inguinal lymphadenopathy, likely reactive due to bilateral lower extremity venous insufficiency, skin dermatitis changes. Non pathologically enlarged cervical lymphadenopathy.  Likely reactive. Night sweats, unclear etiology. PET scan showed Mildly prominent bilateral inguinal lymph nodes with no significant hypermetabolic activity. Findings favored reactive. Observation.   Iron deficiency anemia following bariatric surgery She has been recently started on iron supplementation.  Plan to recheck iron panel in the next few months if no improvement consider IV iron treatments.  Venous insufficiency of both lower extremities Recommend leg elevation, compression stocking  Monoclonal B-cell lymphocytosis of undetermined significance Diagnosis was reviewed with patient.  No treatment needed.  Observation.    Orders Placed This Encounter  Procedures   CBC with Differential (Cancer Center Only)    Standing Status:   Future    Expected Date:   09/21/2023    Expiration Date:   05/21/2024   Iron and TIBC    Standing Status:   Future    Expected Date:   09/21/2023    Expiration Date:   05/21/2024   Ferritin    Standing Status:   Future    Expected Date:   09/21/2023    Expiration Date:   05/21/2024   Retic Panel    Standing Status:   Future    Expected Date:   09/21/2023    Expiration Date:   05/21/2024   Follow-up 4 months All questions were answered. The patient knows to call the clinic with any problems, questions or concerns.  Rickard Patience, MD, PhD [facility] Hematology Oncology 05/22/2023   HISTORY OF PRESENTING ILLNESS:   Janet [name] is a  73 y.o.  female with PMH listed below was seen in consultation at the request of  Saralyn Pilar *   for evaluation of lymphadenopathy  She discovered small round mass in her groin while applying lotion, which led to further investigation.  02/09/2023 CT abdomen pelvis with contrast showed borderline enlarged inguinal lymph nodes.  10 mm left adrenal gland nodule which is indeterminate. Later she also felt a lymph node in her neck, ultrasound neck showed nonpathologically enlarged bilateral cervical lymph nodes.  She experiences night sweats two to three times a week, lasting about half an hour, but does not require changing clothes. No unintentional weight loss, and she attributes her stable weight to frequent eating due to severe acid reflux.  She has a history of gastric bypass surgery, which affects her nutritional absorption. She is currently taking iron and B12 supplements due to deficiencies, which are likely related to her gastric bypass surgery. She has been on iron supplements for two weeks and has been receiving B12 for a longer period.  She has a history of venous insufficiency, which she believes contributes to chronic inflammation and dermatitis in her lower extremities. This condition causes redness and inflammation, and she experiences chronic dermatitis and skin rashes. She [place] and has taught her children to cook traditional foods.  INTERVAL HISTORY [name] is a 73 y.o. female who has above history reviewed by me today presents for follow up visit for lymphadenopathy.  No new complaints. She presents to discuss results.   MEDICAL HISTORY:  Past Medical History:  Diagnosis Date   Anxiety    a.) on BZO (clonazepam) PRN   Cervical  spinal stenosis    Depression    Diastolic dysfunction    a.)  TTE 05/2021: EF 90%, mild LVH, trivial MR/TR/PR, G1DD   Esophageal reflux    Esophageal stricture    a.) s/p multiple (2012, 2015, 2017, 2020) dilitation procedures   GERD (gastroesophageal reflux disease)    Heart murmur    Hyperlipidemia     Hypertension    Insomnia    Osteoarthritis    Osteoporosis    Other secondary kyphosis, cervical region    PAD (peripheral artery disease) (HCC)    PONV (postoperative nausea and vomiting)    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 Jude device   RLS (restless legs syndrome)    a.) takes pramipexole   Sleep apnea    SOB (shortness of breath)    T2DM (type 2 diabetes mellitus) (HCC)    Urinary incontinence    Venous insufficiency of both lower extremities     SURGICAL HISTORY: Past Surgical History:  Procedure Laterality Date   ANTERIOR CERVICAL DECOMP/DISCECTOMY FUSION N/A 11/03/2021   Procedure: C4-7 ANTERIOR CERVICAL DISCECTOMY AND FUSION (GLOBUS FORGE);  Surgeon: [name], MD;  Location: [facility] ORS;  Service: Neurosurgery;  Laterality: N/A;   ANTERIOR LATERAL LUMBAR FUSION WITH PERCUTANEOUS SCREW 2 LEVEL N/A 01/26/2022   Procedure: L3-5 LATERAL LUMBAR INTERBODY FUSION WITH POSTERIOR SPINAL FUSION;  Surgeon: [name], MD;  Location: [facility] ORS;  Service: Neurosurgery;  Laterality: N/A;   APPENDECTOMY  2014   APPLICATION OF INTRAOPERATIVE CT SCAN N/A 01/26/2022   Procedure: APPLICATION OF INTRAOPERATIVE CT SCAN;  Surgeon: [name], MD;  Location: [facility] ORS;  Service: Neurosurgery;  Laterality: N/A;   CARPAL TUNNEL RELEASE Left 1998   ESOPHAGEAL DILATION  2012   2015, 2017, 2020   GASTRIC BYPASS  2019   HERNIA REPAIR  2019   left knee manipulation Left 02/05/2019   PACEMAKER IMPLANT  05/2015   Procedure: PACEMAKER INSERTION; Location: [place]; Surgeon: Maryjean Ka, MD   POLYPECTOMY     SHOULDER SURGERY Left 2014   SHOULDER SURGERY Right 2017   TOTAL KNEE ARTHROPLASTY Right 2018   TOTAL KNEE ARTHROPLASTY Left 10/18/2018   vein removal Bilateral 2012    SOCIAL HISTORY: Social History   Socioeconomic History   Marital status: Married    Spouse name: Jesus Seibold   Number of children: 6   Years of education: Not on file    Highest education level: Not on file  Occupational History   Occupation: Homemaker  Tobacco Use   Smoking status: Former    Current packs/day: 0.00    Average packs/day: 1 pack/day for 25.0 years (25.0 ttl pk-yrs)    Types: Cigarettes    Start date: 25    Quit date: 2007    Years since quitting: 18.2    Passive exposure: Never   Smokeless tobacco: Former  Building services engineer status: Never Used  Substance and Sexual Activity   Alcohol use: Yes    Comment: very rarely   Drug use: Never   Sexual activity: Not Currently  Other Topics Concern   Not on file  Social History Narrative   Not on file   Social Drivers of Health   Financial Resource Strain: Low Risk  (05/05/2023)   [facility]   Overall Financial Resource Strain (CARDIA)    Difficulty of Paying Living Expenses: Not very hard  Food Insecurity: Food Insecurity Present (05/05/2023)   Received from [facility]  [facility]   Hunger Vital Sign    Worried About Running Out of Food in the Last Year: Sometimes true    Ran Out of Food in the Last Year: Never true  Transportation Needs: No Transportation Needs (05/05/2023)   Received from [facility] - Transportation    In the past 12 months, has lack of transportation kept you from medical appointments or from getting medications?: No    Lack of Transportation (Non-Medical): No  Physical Activity: Inactive (11/18/2022)   Exercise Vital Sign    Days of Exercise per Week: 0 days    Minutes of Exercise per Session: 0 min  Stress: No Stress Concern Present (11/18/2022)   [employer] of Occupational Health - Occupational Stress Questionnaire    Feeling of Stress : Only a little  Social Connections: Moderately Isolated (11/18/2022)   Social Connection and Isolation Panel [NHANES]    Frequency of Communication with Friends and Family: Twice a week    Frequency of Social Gatherings with Friends and Family: Once a  week    Attends Religious Services: More than 4 times per year    Active Member of [employer] or Organizations: No    Attends Banker Meetings: Never    Marital Status: Divorced  Catering manager Violence: Not At Risk (02/09/2023)   Humiliation, Afraid, Rape, and Kick questionnaire    Fear of Current or Ex-Partner: No    Emotionally Abused: No    Physically Abused: No    Sexually Abused: No    FAMILY HISTORY: Family History  Problem Relation Age of Onset   Heart attack Mother 90   Heart disease Father    Thyroid disease Father    Skin cancer Father    Brain cancer Maternal Grandmother    Breast cancer Neg Hx     ALLERGIES:  is allergic to celebrex [celecoxib].  MEDICATIONS:  Current Outpatient Medications  Medication Sig Dispense Refill   aspirin 81 MG EC tablet Take 81 mg by mouth daily.     metoprolol tartrate (LOPRESSOR) 25 MG tablet TAKE 1 TABLET BY MOUTH TWICE  DAILY 200 tablet 1   nystatin (MYCOSTATIN/NYSTOP) powder Apply 1 Application topically 3 (three) times daily. 30 g 1   pantoprazole (PROTONIX) 40 MG tablet Take 1 tablet (40 mg total) by mouth 2 (two) times daily before a meal. 180 tablet 1   pramipexole (MIRAPEX) 1 MG tablet Take 1 tablet (1 mg total) by mouth 2 (two) times daily. 180 tablet 3   sucralfate (CARAFATE) 1 g tablet Take 1 tablet (1 g total) by mouth 4 (four) times daily -  with meals and at bedtime. 90 tablet 1   traMADol (ULTRAM) 50 MG tablet Take 1 tablet (50 mg total) by mouth every 8 (eight) hours as needed for moderate pain (pain score 4-6). 2-3 times daily PRN 90 tablet 3   valsartan (DIOVAN) 80 MG tablet Take 1 tablet (80 mg total) by mouth daily. 90 tablet 3   REPATHA SURECLICK 140 MG/ML SOAJ Inject 140 mg into the skin every 14 (fourteen) days. (Patient not taking: Reported on 04/14/2023) 2 mL 5   No current facility-administered medications for this visit.    Review of Systems  Constitutional:  Positive for fatigue. Negative for  appetite change, chills and fever.  HENT:   Negative for hearing loss and voice change.   Eyes:  Negative for eye problems.  Respiratory:  Negative for chest tightness and cough.  Cardiovascular:  Positive for leg swelling. Negative for chest pain.  Gastrointestinal:  Negative for abdominal distention, abdominal pain and blood in stool.  Endocrine: Negative for hot flashes.  Genitourinary:  Negative for difficulty urinating and frequency.   Musculoskeletal:  Negative for arthralgias.  Skin:  Positive for rash. Negative for itching.  Neurological:  Negative for extremity weakness.  Hematological:  Negative for adenopathy.  Psychiatric/Behavioral:  Negative for confusion.    PHYSICAL EXAMINATION: ECOG PERFORMANCE STATUS: 1 - Symptomatic but completely ambulatory Vitals:   05/22/23 1405 05/22/23 1415  BP: (!) 177/94 (!) 159/80  Pulse: 87   Resp: 18   Temp: (!) 96.1 F (35.6 C)   SpO2: 100%    Filed Weights   05/22/23 1405  Weight: 230 lb 11.2 oz (104.6 kg)    Physical Exam Constitutional:      General: She is not in acute distress.    Appearance: She is obese.  HENT:     Head: Normocephalic and atraumatic.  Eyes:     General: No scleral icterus. Cardiovascular:     Rate and Rhythm: Normal rate and regular rhythm.     Heart sounds: Normal heart sounds.  Pulmonary:     Effort: Pulmonary effort is normal. No respiratory distress.     Breath sounds: No wheezing.  Abdominal:     General: Bowel sounds are normal. There is no distension.     Palpations: Abdomen is soft.  Musculoskeletal:        General: No deformity. Normal range of motion.     Cervical back: Normal range of motion and neck supple.     Right lower leg: No edema.     Left lower leg: Edema present.  Skin:    General: Skin is warm and dry.     Findings: No erythema or rash.     Comments: Lower abdomen/groin skin folds erythematous changes Bilateral lower extremity venous insufficiency dermatitis   Neurological:     Mental Status: She is alert and oriented to person, place, and time. Mental status is at baseline.  Psychiatric:        Mood and Affect: Mood normal.     LABORATORY DATA:  I have reviewed the data as listed    Latest Ref Rng & Units 05/08/2023    4:02 PM 01/20/2023    2:01 PM 12/02/2022   11:04 AM  CBC  WBC 4.0 - 10.5 K/uL 7.7  8.8  7.1   Hemoglobin 12.0 - 15.0 g/dL 41.3  24.4  01.0   Hematocrit 36.0 - 46.0 % 40.8  40.8  39.9   Platelets 150 - 400 K/uL 292  325  285       Latest Ref Rng & Units 05/08/2023    4:02 PM 01/20/2023    2:01 PM 12/02/2022   11:04 AM  CMP  Glucose 70 - 99 mg/dL 272  90  536   BUN 8 - 23 mg/dL 23  22  13    Creatinine 0.44 - 1.00 mg/dL 6.44  0.34  7.42   Sodium 135 - 145 mmol/L 138  141  141   Potassium 3.5 - 5.1 mmol/L 3.8  4.8  4.5   Chloride 98 - 111 mmol/L 103  105  105   CO2 22 - 32 mmol/L 26  29  30    Calcium 8.9 - 10.3 mg/dL 8.8  9.8  9.2   Total Protein 6.5 - 8.1 g/dL 7.6  7.5  6.9   Total Bilirubin  0.0 - 1.2 mg/dL 0.8  0.7  0.8   Alkaline Phos 38 - 126 U/L 90     AST 15 - 41 U/L 20  32  14   ALT 0 - 44 U/L 19  29  11        RADIOGRAPHIC STUDIES: I have personally reviewed the radiological images as listed and agreed with the findings in the report. NM PET Image Initial (PI) Skull Base To Thigh Result Date: 05/22/2023 CLINICAL DATA:  Initial treatment strategy for lymphadenopathy. Bilateral inguinal adenopathy. EXAM: NUCLEAR MEDICINE PET SKULL BASE TO THIGH TECHNIQUE: 12.3 mCi F-18 FDG was injected intravenously. Full-ring PET imaging was performed from the skull base to thigh after the radiotracer. CT data was obtained and used for attenuation correction and anatomic localization. Fasting blood glucose: 74 mg/dl COMPARISON:  None Available. FINDINGS: NECK: No hypermetabolic lymph nodes in the neck. Incidental CT findings: None. CHEST: No hypermetabolic mediastinal or hilar nodes. No suspicious pulmonary nodules on the CT  scan. Incidental CT findings: None. ABDOMEN/PELVIS: Mildly prominent bilateral inguinal lymph nodes. Lymph nodes measure between 10 and 12 mm. No significant hypermetabolic activity. Lymph node activity is similar to background blood pool. Incidental CT findings: . Post bariatric surgery. No splenomegaly. Post hysterectomy. Adnexa unremarkable SKELETON: Focal activity adjacent to the LEFT greater trochanter is favored benign inflammatory. Incidental CT findings: None. IMPRESSION: 1. Mildly prominent bilateral inguinal lymph nodes with no significant hypermetabolic activity. Findings favored reactive. 2. No evidence of lymphoma or metastatic disease. Electronically Signed   By: Genevive Bi M.D.   On: 05/22/2023 11:43   MM 3D DIAGNOSTIC MAMMOGRAM BILATERAL BREAST Result Date: 05/10/2023 CLINICAL DATA:  Two palpable areas in the LEFT breast for 2 weeks, decreasing in size EXAM: DIGITAL DIAGNOSTIC BILATERAL MAMMOGRAM WITH TOMOSYNTHESIS AND CAD; ULTRASOUND LEFT BREAST LIMITED TECHNIQUE: Bilateral digital diagnostic mammography and breast tomosynthesis was performed. The images were evaluated with computer-aided detection. ; Targeted ultrasound examination of the left breast was performed. Best images possible per technologist communication. COMPARISON:  Previous exam(s). ACR Breast Density Category b: There are scattered areas of fibroglandular density. FINDINGS: Spot compression tomosynthesis views were obtained over the palpable area of concern in the LEFT breast and LEFT axilla. No suspicious mammographic finding is identified in these areas. No suspicious mass, microcalcification, or other finding is identified in either breast. On physical exam, no suspicious mass is appreciated. Targeted LEFT breast ultrasound was performed in the palpable area of concern at the LEFT axilla. No suspicious solid or cystic mass is identified. Targeted LEFT breast ultrasound was performed in the palpable area of concern at the  outer breast. No suspicious solid or cystic mass is identified. IMPRESSION: 1. No mammographic or sonographic evidence of malignancy at the sites of palpable concern in the LEFT breast. Any further workup of the patient's symptoms should be based on the clinical assessment. Recommend routine annual screening mammogram in 1 year. 2. No mammographic evidence of malignancy bilaterally RECOMMENDATION: Screening mammogram in one year.(Code:SM-B-01Y) I have discussed the findings and recommendations with the patient. If applicable, a reminder letter will be sent to the patient regarding the next appointment. BI-RADS CATEGORY  1: Negative. Electronically Signed   By: Meda Klinefelter M.D.   On: 05/10/2023 10:44   [place] LIMITED ULTRASOUND INCLUDING AXILLA LEFT BREAST  Result Date: 05/10/2023 CLINICAL DATA:  Two palpable areas in the LEFT breast for 2 weeks, decreasing in size EXAM: DIGITAL DIAGNOSTIC BILATERAL MAMMOGRAM WITH TOMOSYNTHESIS AND CAD; ULTRASOUND LEFT BREAST LIMITED TECHNIQUE:  Bilateral digital diagnostic mammography and breast tomosynthesis was performed. The images were evaluated with computer-aided detection. ; Targeted ultrasound examination of the left breast was performed. Best images possible per technologist communication. COMPARISON:  Previous exam(s). ACR Breast Density Category b: There are scattered areas of fibroglandular density. FINDINGS: Spot compression tomosynthesis views were obtained over the palpable area of concern in the LEFT breast and LEFT axilla. No suspicious mammographic finding is identified in these areas. No suspicious mass, microcalcification, or other finding is identified in either breast. On physical exam, no suspicious mass is appreciated. Targeted LEFT breast ultrasound was performed in the palpable area of concern at the LEFT axilla. No suspicious solid or cystic mass is identified. Targeted LEFT breast ultrasound was performed in the palpable area of concern at the outer  breast. No suspicious solid or cystic mass is identified. IMPRESSION: 1. No mammographic or sonographic evidence of malignancy at the sites of palpable concern in the LEFT breast. Any further workup of the patient's symptoms should be based on the clinical assessment. Recommend routine annual screening mammogram in 1 year. 2. No mammographic evidence of malignancy bilaterally RECOMMENDATION: Screening mammogram in one year.(Code:SM-B-01Y) I have discussed the findings and recommendations with the patient. If applicable, a reminder letter will be sent to the patient regarding the next appointment. BI-RADS CATEGORY  1: Negative. Electronically Signed   By: Meda Klinefelter M.D.   On: 05/10/2023 10:44   US Soft Tissue Head/Neck (NON-THYROID) Result Date: 03/18/2023 CLINICAL DATA:  Recurrent cervical lymphadenopathy. EXAM: ULTRASOUND OF HEAD/NECK SOFT TISSUES TECHNIQUE: Ultrasound examination of the head and neck soft tissues was performed in the area of clinical concern. COMPARISON:  None Available. FINDINGS: Sonographic evaluation of patient's palpable area of concern correlates with non pathologically enlarged bilateral cervical lymph nodes with index left cervical lymph node measuring 0.5 cm in greatest short axis diameter, not enlarged by size criteria though does demonstrate cortical thickening (representative image 14). The remainder of the imaged cervical lymph nodes are benign in appearance. No evidence of suppuration. IMPRESSION: Sonographic evaluation of patient's palpable area of concern correlates with mildly enlarged though non pathologically enlarged bilateral cervical lymph nodes, presumably reactive in etiology. Clinical correlation to resolution is advised. Electronically Signed   By: Simonne Come M.D.   On: 03/18/2023 11:06   DG Bone Density Result Date: 03/08/2023 EXAM: DUAL X-RAY ABSORPTIOMETRY (DXA) FOR BONE MINERAL DENSITY IMPRESSION: Your patient Charlie Char completed a BMD test on 03/08/2023  using the [employer] Advance DXA System (analysis version: 14.10) manufactured by [employer]. The following summarizes the results of our evaluation. Technologist: LCE PATIENT BIOGRAPHICAL: Name: Shiza, Thelen Patient ID: [id number] Birth Date: 1950-10-24 Height: 61.5 in. Gender: Female Exam Date: 03/08/2023 Weight: 225.0 lbs. Indications: Knee Replacement Left, Knee Replacement Right, POSTmenopausal Fractures: Treatments: ASSESSMENT: The BMD measured at Femur Neck Left is 0.953 g/cm2 with a T-score of -0.6. This patient is considered normal according to World Health Organization [facility]) criteria. L-3 and L-4 were excluded due to surgical hardware. The scan quality is good. Site Region Measured Measured WHO Young Adult BMD Date       Age      Classification T-score AP Spine L1-L2 5/08/2023 70.1 Normal 0.2 1.185 g/cm2 DualFemur Neck Left 03/08/2023 70.1 Normal -0.6 0.953 g/cm2 Left Forearm Radius 33% 03/08/2023 70.1 Normal 0.8 0.948 g/cm2 World Health Organization [facility]) criteria for post-menopausal, Caucasian Women: Normal:       T-score at or above -1 SD Osteopenia:   T-score between -1  and -2.5 SD Osteoporosis: T-score at or below -2.5 SD RECOMMENDATIONS: 1. All patients should optimize calcium and vitamin D intake. 2. Consider FDA-approved medical therapies in postmenopausal women and men aged 73 years and older, based on the following: a. A hip or vertebral (clinical or morphometric) fracture b. T-score < -2.5 at the femoral neck or spine after appropriate evaluation to exclude secondary causes c. Low bone mass (T-score between -1.0 and -2.5 at the femoral neck or spine) and a 10-year probability of a hip fracture > 3% or a 10-year probability of a major osteoporosis-related fracture > 20% based on the [place]-adapted WHO algorithm d. Clinician judgment and/or patient preferences may indicate treatment for people with 10-year fracture probabilities above or below these levels FOLLOW-UP: People with diagnosed cases of  osteoporosis or at high risk for fracture should have regular bone mineral density tests. For patients eligible for Medicare, routine testing is allowed once every 2 years. The testing frequency can be increased to one year for patients who have rapidly progressing disease, those who are receiving or discontinuing medical therapy to restore bone mass, or have additional risk factors. I have reviewed this report, and agree with the above findings. [facility] Electronically Signed   By: Romona Curls M.D.   On: 03/08/2023 12:21

## 2023-05-23 NOTE — Assessment & Plan Note (Signed)
 Diagnosis was reviewed with patient.  No treatment needed.  Observation.

## 2023-05-26 ENCOUNTER — Telehealth: Payer: Self-pay

## 2023-05-26 NOTE — Telephone Encounter (Signed)
 Janet [name] ([employer]) PA Case ID #: [id number] Rx #: [id number] Need Help? Call us at [phone] Outcome Approved today by [facility] NCPDP 2017 Your request has been approved Effective Date: 04/01/2023 Authorization Expiration Date: 02/28/2024 Drug [employer] SureClick 140MG /ML auto-injectors ePA cloud Psychologist, educational Medicare Electronic [place] Form [phone] NCPDP) Original Claim Info [phone]

## 2023-06-07 ENCOUNTER — Encounter: Payer: Self-pay | Admitting: Oncology

## 2023-06-08 DIAGNOSIS — T189XXA Foreign body of alimentary tract, part unspecified, initial encounter: Secondary | ICD-10-CM | POA: Diagnosis not present

## 2023-06-13 DIAGNOSIS — M79604 Pain in right leg: Secondary | ICD-10-CM | POA: Diagnosis not present

## 2023-06-13 DIAGNOSIS — G2581 Restless legs syndrome: Secondary | ICD-10-CM | POA: Diagnosis not present

## 2023-06-13 DIAGNOSIS — R202 Paresthesia of skin: Secondary | ICD-10-CM | POA: Diagnosis not present

## 2023-06-13 DIAGNOSIS — G479 Sleep disorder, unspecified: Secondary | ICD-10-CM | POA: Diagnosis not present

## 2023-06-13 DIAGNOSIS — G5601 Carpal tunnel syndrome, right upper limb: Secondary | ICD-10-CM | POA: Diagnosis not present

## 2023-06-13 DIAGNOSIS — R200 Anesthesia of skin: Secondary | ICD-10-CM | POA: Diagnosis not present

## 2023-06-13 DIAGNOSIS — G629 Polyneuropathy, unspecified: Secondary | ICD-10-CM | POA: Diagnosis not present

## 2023-06-21 ENCOUNTER — Encounter: Payer: Self-pay | Admitting: Oncology

## 2023-07-12 DIAGNOSIS — G5601 Carpal tunnel syndrome, right upper limb: Secondary | ICD-10-CM | POA: Diagnosis not present

## 2023-07-13 ENCOUNTER — Encounter: Payer: Self-pay | Admitting: *Deleted

## 2023-07-19 DIAGNOSIS — G4733 Obstructive sleep apnea (adult) (pediatric): Secondary | ICD-10-CM | POA: Diagnosis not present

## 2023-07-19 DIAGNOSIS — K219 Gastro-esophageal reflux disease without esophagitis: Secondary | ICD-10-CM | POA: Diagnosis not present

## 2023-07-19 DIAGNOSIS — I495 Sick sinus syndrome: Secondary | ICD-10-CM | POA: Diagnosis not present

## 2023-07-19 DIAGNOSIS — E859 Amyloidosis, unspecified: Secondary | ICD-10-CM | POA: Diagnosis not present

## 2023-07-19 DIAGNOSIS — Z950 Presence of cardiac pacemaker: Secondary | ICD-10-CM | POA: Diagnosis not present

## 2023-07-19 DIAGNOSIS — E119 Type 2 diabetes mellitus without complications: Secondary | ICD-10-CM | POA: Diagnosis not present

## 2023-07-19 DIAGNOSIS — I10 Essential (primary) hypertension: Secondary | ICD-10-CM | POA: Diagnosis not present

## 2023-07-19 DIAGNOSIS — R079 Chest pain, unspecified: Secondary | ICD-10-CM | POA: Diagnosis not present

## 2023-07-19 DIAGNOSIS — E785 Hyperlipidemia, unspecified: Secondary | ICD-10-CM | POA: Diagnosis not present

## 2023-07-19 DIAGNOSIS — Z01818 Encounter for other preprocedural examination: Secondary | ICD-10-CM | POA: Diagnosis not present

## 2023-07-19 DIAGNOSIS — R600 Localized edema: Secondary | ICD-10-CM | POA: Diagnosis not present

## 2023-07-21 ENCOUNTER — Other Ambulatory Visit: Payer: Self-pay | Admitting: Internal Medicine

## 2023-07-21 DIAGNOSIS — E859 Amyloidosis, unspecified: Secondary | ICD-10-CM

## 2023-07-27 ENCOUNTER — Encounter: Payer: Self-pay | Admitting: *Deleted

## 2023-07-28 ENCOUNTER — Encounter: Payer: Self-pay | Admitting: *Deleted

## 2023-07-28 ENCOUNTER — Encounter
Admission: RE | Disposition: A | Discharge status: Home / Self Care | Payer: Self-pay | Source: Home / Self Care | Attending: Gastroenterology

## 2023-07-28 ENCOUNTER — Ambulatory Visit: Admitting: Anesthesiology

## 2023-07-28 ENCOUNTER — Ambulatory Visit
Admission: RE | Admit: 2023-07-28 | Discharge: 2023-07-28 | Disposition: A | Discharge status: Home / Self Care | Attending: Gastroenterology | Admitting: Gastroenterology

## 2023-07-28 DIAGNOSIS — K2100 Gastro-esophageal reflux disease with esophagitis, without bleeding: Secondary | ICD-10-CM | POA: Diagnosis not present

## 2023-07-28 DIAGNOSIS — K5730 Diverticulosis of large intestine without perforation or abscess without bleeding: Secondary | ICD-10-CM | POA: Diagnosis not present

## 2023-07-28 DIAGNOSIS — R1013 Epigastric pain: Secondary | ICD-10-CM | POA: Insufficient documentation

## 2023-07-28 DIAGNOSIS — K635 Polyp of colon: Secondary | ICD-10-CM | POA: Diagnosis not present

## 2023-07-28 DIAGNOSIS — Z981 Arthrodesis status: Secondary | ICD-10-CM | POA: Insufficient documentation

## 2023-07-28 DIAGNOSIS — Z9884 Bariatric surgery status: Secondary | ICD-10-CM | POA: Insufficient documentation

## 2023-07-28 DIAGNOSIS — Z860100 Personal history of colon polyps, unspecified: Secondary | ICD-10-CM | POA: Diagnosis not present

## 2023-07-28 DIAGNOSIS — X58XXXA Exposure to other specified factors, initial encounter: Secondary | ICD-10-CM | POA: Diagnosis not present

## 2023-07-28 DIAGNOSIS — I10 Essential (primary) hypertension: Secondary | ICD-10-CM | POA: Diagnosis not present

## 2023-07-28 DIAGNOSIS — K648 Other hemorrhoids: Secondary | ICD-10-CM | POA: Diagnosis not present

## 2023-07-28 DIAGNOSIS — T184XXA Foreign body in colon, initial encounter: Secondary | ICD-10-CM | POA: Diagnosis not present

## 2023-07-28 DIAGNOSIS — K209 Esophagitis, unspecified without bleeding: Secondary | ICD-10-CM | POA: Diagnosis not present

## 2023-07-28 DIAGNOSIS — D472 Monoclonal gammopathy: Secondary | ICD-10-CM | POA: Diagnosis not present

## 2023-07-28 DIAGNOSIS — Z1211 Encounter for screening for malignant neoplasm of colon: Secondary | ICD-10-CM | POA: Insufficient documentation

## 2023-07-28 DIAGNOSIS — K3189 Other diseases of stomach and duodenum: Secondary | ICD-10-CM | POA: Diagnosis not present

## 2023-07-28 DIAGNOSIS — Z87891 Personal history of nicotine dependence: Secondary | ICD-10-CM | POA: Diagnosis not present

## 2023-07-28 DIAGNOSIS — D122 Benign neoplasm of ascending colon: Secondary | ICD-10-CM | POA: Diagnosis not present

## 2023-07-28 DIAGNOSIS — K640 First degree hemorrhoids: Secondary | ICD-10-CM | POA: Diagnosis not present

## 2023-07-28 DIAGNOSIS — K5790 Diverticulosis of intestine, part unspecified, without perforation or abscess without bleeding: Secondary | ICD-10-CM | POA: Diagnosis not present

## 2023-07-28 DIAGNOSIS — E1151 Type 2 diabetes mellitus with diabetic peripheral angiopathy without gangrene: Secondary | ICD-10-CM | POA: Diagnosis not present

## 2023-07-28 DIAGNOSIS — K319 Disease of stomach and duodenum, unspecified: Secondary | ICD-10-CM | POA: Diagnosis not present

## 2023-07-28 DIAGNOSIS — D123 Benign neoplasm of transverse colon: Secondary | ICD-10-CM | POA: Diagnosis not present

## 2023-07-28 HISTORY — DX: Congenital kyphosis, cervical region: Q76.412

## 2023-07-28 HISTORY — DX: Spondylolisthesis, lumbar region: M43.16

## 2023-07-28 HISTORY — DX: Fibromyalgia: M79.7

## 2023-07-28 HISTORY — DX: Major depressive disorder, recurrent, in full remission: F33.42

## 2023-07-28 HISTORY — DX: Personal history of other venous thrombosis and embolism: Z86.718

## 2023-07-28 HISTORY — DX: Disease of spinal cord, unspecified: G95.9

## 2023-07-28 HISTORY — DX: Lymphedema, not elsewhere classified: I89.0

## 2023-07-28 HISTORY — DX: Occlusion and stenosis of left carotid artery: I65.22

## 2023-07-28 HISTORY — DX: Polyneuropathy, unspecified: G62.9

## 2023-07-28 HISTORY — DX: Spinal stenosis, lumbar region without neurogenic claudication: M48.061

## 2023-07-28 HISTORY — PX: COLONOSCOPY: SHX5424

## 2023-07-28 HISTORY — DX: Restless legs syndrome: G25.81

## 2023-07-28 HISTORY — DX: Presence of cardiac pacemaker: Z95.0

## 2023-07-28 HISTORY — DX: Stress incontinence (female) (male): N39.3

## 2023-07-28 HISTORY — PX: ESOPHAGOGASTRODUODENOSCOPY: SHX5428

## 2023-07-28 LAB — GLUCOSE, CAPILLARY: Glucose-Capillary: 72 mg/dL (ref 70–99)

## 2023-07-28 SURGERY — COLONOSCOPY
Anesthesia: General

## 2023-07-28 MED ORDER — PROPOFOL 1000 MG/100ML IV EMUL
INTRAVENOUS | Status: AC
  Filled 2023-07-28: qty 100

## 2023-07-28 MED ORDER — EPHEDRINE 5 MG/ML INJ
INTRAVENOUS | Status: AC
  Filled 2023-07-28: qty 10

## 2023-07-28 MED ORDER — SODIUM CHLORIDE 0.9 % IV SOLN
INTRAVENOUS | Status: DC
Start: 2023-07-28 — End: 2023-07-28

## 2023-07-28 MED ORDER — LIDOCAINE HCL (PF) 2 % IJ SOLN
INTRAMUSCULAR | Status: AC
  Filled 2023-07-28: qty 10

## 2023-07-28 MED ORDER — PHENYLEPHRINE 80 MCG/ML (10ML) SYRINGE FOR IV PUSH (FOR BLOOD PRESSURE SUPPORT)
PREFILLED_SYRINGE | INTRAVENOUS | Status: DC | PRN
  Administered 2023-07-28 (×3): 160 ug via INTRAVENOUS
  Administered 2023-07-28: 80 ug via INTRAVENOUS

## 2023-07-28 MED ORDER — PROPOFOL 10 MG/ML IV BOLUS
INTRAVENOUS | Status: DC | PRN
Start: 2023-07-28 — End: 2023-07-28
  Administered 2023-07-28 (×2): 50 mg via INTRAVENOUS

## 2023-07-28 MED ORDER — PROPOFOL 500 MG/50ML IV EMUL
INTRAVENOUS | Status: DC | PRN
  Administered 2023-07-28: 75 ug/kg/min via INTRAVENOUS

## 2023-07-28 MED ORDER — PHENYLEPHRINE 80 MCG/ML (10ML) SYRINGE FOR IV PUSH (FOR BLOOD PRESSURE SUPPORT)
PREFILLED_SYRINGE | INTRAVENOUS | Status: AC
  Filled 2023-07-28: qty 10

## 2023-07-28 MED ORDER — GLYCOPYRROLATE 0.2 MG/ML IJ SOLN
INTRAMUSCULAR | Status: DC | PRN
  Administered 2023-07-28: .2 mg via INTRAVENOUS

## 2023-07-28 MED ORDER — LIDOCAINE HCL (CARDIAC) PF 100 MG/5ML IV SOSY
PREFILLED_SYRINGE | INTRAVENOUS | Status: DC | PRN
  Administered 2023-07-28: 80 mg via INTRAVENOUS

## 2023-07-28 MED ORDER — GLYCOPYRROLATE 0.2 MG/ML IJ SOLN
INTRAMUSCULAR | Status: AC
  Filled 2023-07-28: qty 1

## 2023-07-28 MED ORDER — DEXMEDETOMIDINE HCL IN NACL 80 MCG/20ML IV SOLN
INTRAVENOUS | Status: DC | PRN
Start: 2023-07-28 — End: 2023-07-28
  Administered 2023-07-28: 8 ug via INTRAVENOUS
  Administered 2023-07-28: 12 ug via INTRAVENOUS

## 2023-07-28 NOTE — Transfer of Care (Signed)
 Immediate Anesthesia Transfer of Care Note  Patient: Janet [name]  Procedure(s) Performed: COLONOSCOPY EGD (ESOPHAGOGASTRODUODENOSCOPY)  Patient Location: PACU  Anesthesia Type:General  Level of Consciousness: sedated  Airway & Oxygen Therapy: Patient Spontanous Breathing and Patient connected to nasal cannula oxygen  Post-op Assessment: Report given to RN and Post -op Vital signs reviewed and stable  Post vital signs: Reviewed and stable  Last Vitals:  Vitals Value Taken Time  BP 79/37 07/28/23 1203  Temp 35.8 C 07/28/23 1203  Pulse 74 07/28/23 1203  Resp 12 07/28/23 1203  SpO2 98 % 07/28/23 1207    Last Pain:  Vitals:   07/28/23 1203  TempSrc: Temporal         Complications: No notable events documented.

## 2023-07-28 NOTE — Anesthesia Preprocedure Evaluation (Addendum)
 Anesthesia Evaluation  Patient identified by MRN, date of birth, ID band Patient awake    Reviewed: Allergy & Precautions, NPO status , Patient's Chart, lab work & pertinent test results  History of Anesthesia Complications (+) PONV and history of anesthetic complications  Airway Mallampati: III  TM Distance: >3 FB Neck ROM: full    Dental  (+) Dental Advidsory Given, Poor Dentition, Edentulous Upper   Pulmonary neg shortness of breath, sleep apnea , neg COPD, former smoker   Pulmonary exam normal        Cardiovascular hypertension, Pt. on home beta blockers (-) angina + Peripheral Vascular Disease  (-) Past MI and (-) CABG Normal cardiovascular exam+ pacemaker   VWU:[id number]  NORMAL LEFT VENTRICULAR SYSTOLIC FUNCTION   WITH MILD LVH  NORMAL RIGHT VENTRICULAR SYSTOLIC FUNCTION  TRIVIAL REGURGITATION NOTED (See above)  NO VALVULAR STENOSIS  ESTIMATED LVEF >55%  CALCULATED: 64%  GLS: -21.3%  TRIVIAL PERICARDIAL EFFUSION (see subcostal image    Lymphadenopathy Bilateral inguinal lymphadenopathy, likely reactive due to bilateral lower extremity venous insufficiency,    Neuro/Psych  PSYCHIATRIC DISORDERS  Depression     Neuromuscular disease (status post XLIF/PSF L3-L5)    GI/Hepatic Neg liver ROS,GERD  ,,  Endo/Other  diabetes, Well Controlled, Type 2    Renal/GU      Musculoskeletal   Abdominal  (+) + obese  Peds  Hematology  (+) Blood dyscrasia (IDA), anemia Monoclonal B-cell lymphocytosis of undetermined significance   Anesthesia Other Findings Past Medical History: No date: Anxiety     Comment:  a.) on BZO (clonazepam ) PRN No date: Cervical spinal stenosis No date: Depression No date: Diastolic dysfunction     Comment:  a.)  TTE 05/2021: EF 90%, mild LVH, trivial MR/TR/PR,               G1DD No date: Esophageal reflux No date: Esophageal stricture     Comment:  a.) s/p multiple (2012, 2015, 2017,  2020) dilitation               procedures No date: GERD (gastroesophageal reflux disease) No date: Heart murmur No date: Hyperlipidemia No date: Hypertension No date: Insomnia No date: Osteoarthritis No date: Osteoporosis No date: Other secondary kyphosis, cervical region No date: PAD (peripheral artery disease) (HCC) No date: PONV (postoperative nausea and vomiting) 05/2015: Presence of permanent cardiac pacemaker     Comment:  a.) St. Jude device No date: RLS (restless legs syndrome)     Comment:  a.) takes pramipexole  No date: Sleep apnea No date: SOB (shortness of breath) No date: T2DM (type 2 diabetes mellitus) (HCC) No date: Urinary incontinence No date: Venous insufficiency of both lower extremities  Past Surgical History: 11/03/2021: ANTERIOR CERVICAL DECOMP/DISCECTOMY FUSION; N/A     Comment:  Procedure: C4-7 ANTERIOR CERVICAL DISCECTOMY AND FUSION               (GLOBUS FORGE);  Surgeon: Jodeen Munch, MD;                Location: [facility] ORS;  Service: Neurosurgery;  Laterality:               N/A; 2014: APPENDECTOMY 1998: CARPAL TUNNEL RELEASE; Left 2012: ESOPHAGEAL DILATION     Comment:  2015, 2017, 2020 2019: GASTRIC BYPASS 2019: HERNIA REPAIR 02/05/2019: left knee manipulation; Left 05/2015: PACEMAKER IMPLANT     Comment:  Procedure: PACEMAKER INSERTION; Location: [place],  [place]; Surgeon: Cleda Curly, MD No date: POLYPECTOMY 2014: SHOULDER SURGERY; Left 2017: SHOULDER SURGERY; Right 2018: TOTAL KNEE ARTHROPLASTY; Right 10/18/2018: TOTAL KNEE ARTHROPLASTY; Left 2012: vein removal; Bilateral  BMI    Body Mass Index: 39.56 kg/m      Reproductive/Obstetrics negative OB ROS                             Anesthesia Physical Anesthesia Plan  ASA: 3  Anesthesia Plan: General   Post-op Pain Management:    Induction: Intravenous  PONV Risk Score and Plan: 4 or greater and Propofol  infusion and TIVA  Airway Management  Planned: Natural Airway  Additional Equipment:   Intra-op Plan:   Post-operative Plan:   Informed Consent: I have reviewed the patients History and Physical, chart, labs and discussed the procedure including the risks, benefits and alternatives for the proposed anesthesia with the patient or authorized representative who has indicated his/her understanding and acceptance.     Dental Advisory Given  Plan Discussed with: Anesthesiologist, CRNA and Surgeon  Anesthesia Plan Comments:         Anesthesia Quick Evaluation

## 2023-07-28 NOTE — H&P (Signed)
 Outpatient short stay form Pre-procedure 07/28/2023  Shane Darling, MD  Primary Physician: Raina Bunting, DO  Reason for visit:  Epigastric pain and surveillance colonoscopy  History of present illness:    73 y/o lady with history of hypertension, cervical stenosis, and cervical stenosis here for EGD/Colonoscopy for epigastric pain and surveillance colonoscopy. No blood thinners. History of gastric bypass. Last colonoscopy 5 years ago in [place]  with unknown results. No family history of GI malignancies.    Current Facility-Administered Medications:    0.9 %  sodium chloride  infusion, , Intravenous, Continuous, Atina Feeley, Leanora Prophet, MD, Last Rate: 20 mL/hr at 07/28/23 1046, Continued from Pre-op at 07/28/23 1046  Medications Prior to Admission  Medication Sig Dispense Refill Last Dose/Taking   aspirin  81 MG EC tablet Take 81 mg by mouth daily.   Past Week   metoprolol  tartrate (LOPRESSOR ) 25 MG tablet TAKE 1 TABLET BY MOUTH TWICE  DAILY 200 tablet 1 07/28/2023 Morning   pantoprazole  (PROTONIX ) 40 MG tablet Take 1 tablet (40 mg total) by mouth 2 (two) times daily before a meal. 180 tablet 1 Past Week   pramipexole  (MIRAPEX ) 1 MG tablet Take 1 tablet (1 mg total) by mouth 2 (two) times daily. 180 tablet 3 07/28/2023 Morning   traMADol  (ULTRAM ) 50 MG tablet Take 1 tablet (50 mg total) by mouth every 8 (eight) hours as needed for moderate pain (pain score 4-6). 2-3 times daily PRN 90 tablet 3 Past Month   valsartan  (DIOVAN ) 80 MG tablet Take 1 tablet (80 mg total) by mouth daily. 90 tablet 3 07/28/2023 Morning   nystatin  (MYCOSTATIN /NYSTOP ) powder Apply 1 Application topically 3 (three) times daily. 30 g 1    REPATHA  SURECLICK 140 MG/ML SOAJ Inject 140 mg into the skin every 14 (fourteen) days. (Patient not taking: Reported on 04/14/2023) 2 mL 5 06/30/2023   sucralfate  (CARAFATE ) 1 g tablet Take 1 tablet (1 g total) by mouth 4 (four) times daily -  with meals and at bedtime. 90 tablet  1      Allergies  Allergen Reactions   Celebrex  [Celecoxib ] Other (See Comments)    Burning all over, increased heartbeat, upset stomach     Past Medical History:  Diagnosis Date   Anxiety    a.) on BZO (clonazepam ) PRN   Atherosclerosis of left carotid artery    Cardiac pacemaker in situ    Cervical myelopathy (HCC)    Cervical spinal stenosis    Congenital kyphosis, cervical region    Depression    Diastolic dysfunction    a.)  TTE 05/2021: EF 90%, mild LVH, trivial MR/TR/PR, G1DD   Esophageal reflux    Esophageal stricture    a.) s/p multiple (2012, 2015, 2017, 2020) dilitation procedures   Fibromyositis    GERD (gastroesophageal reflux disease)    Heart murmur    History of deep vein thrombosis    Hyperlipidemia    Hypertension    Insomnia    Lymphedema    Major depression, recurrent, full remission (HCC)    Osteoarthritis    Osteoporosis    Other secondary kyphosis, cervical region    PAD (peripheral artery disease) (HCC)    Peripheral nerve disease    PONV (postoperative nausea and vomiting)    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 Jude device   Restless legs syndrome    RLS (restless legs syndrome)    a.) takes pramipexole    Sleep apnea    SOB (shortness of  breath)    Spinal stenosis of lumbar region    Spondylolisthesis of lumbar region    SUI (stress urinary incontinence, female)    T2DM (type 2 diabetes mellitus) (HCC)    Urinary incontinence    Venous insufficiency of both lower extremities     Review of systems:  Otherwise negative.    Physical Exam  Gen: Alert, oriented. Appears stated age.  HEENT: PERRLA. Lungs: No respiratory distress CV: RRR Abd: soft, benign, no masses Ext: No edema    Planned procedures: Proceed with EGD/colonoscopy. The patient understands the nature of the planned procedure, indications, risks, alternatives and potential complications including but not limited to bleeding, infection,  perforation, damage to internal organs and possible oversedation/side effects from anesthesia. The patient agrees and gives consent to proceed.  Please refer to procedure notes for findings, recommendations and patient disposition/instructions.     Shane Darling, MD [facility]

## 2023-07-28 NOTE — Op Note (Signed)
 [facility] Gastroenterology Patient Name: Janet [name] Procedure Date: 07/28/2023 11:22 AM MRN: [id number] Account #: [id number] Date of Birth: [birth year] Admit Type: Outpatient Age: 73 Room: [facility] ENDO ROOM 3 Gender: Female Note Status: Finalized Instrument Name: Almyra Jain [id number] Procedure:             Upper GI endoscopy Indications:           Epigastric abdominal pain Providers:             Leida Puna MD, MD Referring MD:          Leida Puna MD, MD (Referring MD), Raina Bunting (Referring MD) Medicines:             Monitored Anesthesia Care Complications:         No immediate complications. Estimated blood loss:                         Minimal. Procedure:             Pre-Anesthesia Assessment:                        - Prior to the procedure, a History and Physical was                         performed, and patient medications and allergies were                         reviewed. The patient is competent. The risks and                         benefits of the procedure and the sedation options and                         risks were discussed with the patient. All questions                         were answered and informed consent was obtained.                         Patient identification and proposed procedure were                         verified by the physician, the nurse, the                         anesthesiologist, the anesthetist and the technician                         in the endoscopy suite. Mental Status Examination:                         alert and oriented. Airway Examination: normal                         oropharyngeal airway and neck mobility. Respiratory  Examination: clear to auscultation. CV Examination:                         normal. Prophylactic Antibiotics: The patient does not                         require prophylactic antibiotics. Prior                          Anticoagulants: The patient has taken no anticoagulant                         or antiplatelet agents. ASA Grade Assessment: III - A                         patient with severe systemic disease. After reviewing                         the risks and benefits, the patient was deemed in                         satisfactory condition to undergo the procedure. The                         anesthesia plan was to use monitored anesthesia care                         (MAC). Immediately prior to administration of                         medications, the patient was re-assessed for adequacy                         to receive sedatives. The heart rate, respiratory                         rate, oxygen saturations, blood pressure, adequacy of                         pulmonary ventilation, and response to care were                         monitored throughout the procedure. The physical                         status of the patient was re-assessed after the                         procedure.                        After obtaining informed consent, the endoscope was                         passed under direct vision. Throughout the procedure,                         the patient's blood pressure, pulse, and oxygen  saturations were monitored continuously. The Endoscope                         was introduced through the mouth, and advanced to the                         second part of duodenum. The upper GI endoscopy was                         accomplished without difficulty. The patient tolerated                         the procedure well. Findings:      LA Grade A (one or more mucosal breaks less than 5 mm, not extending       between tops of 2 mucosal folds) esophagitis with no bleeding was found.      Evidence of a gastric bypass was found. A gastric pouch with a large       size was found containing suture material. The gastrojejunal anastomosis       was characterized by  healthy appearing mucosa.      Biopsies were taken with a cold forceps in the stomach for Helicobacter       pylori testing. Estimated blood loss was minimal.      The examined jejunum was normal. Impression:            - LA Grade A esophagitis with no bleeding.                        - Gastric bypass with a large-sized pouch.                         Gastrojejunal anastomosis characterized by healthy                         appearing mucosa.                        - Normal examined jejunum.                        - Biopsies were taken with a cold forceps for                         Helicobacter pylori testing. Recommendation:        - Discharge patient to home.                        - Resume previous diet.                        - Continue present medications.                        - Await pathology results.                        - Return to referring physician as previously                         scheduled. Procedure Code(s):     ---  Professional ---                        [phone], Esophagogastroduodenoscopy, flexible,                         transoral; with biopsy, single or multiple Diagnosis Code(s):     --- Professional ---                        K20.90, Esophagitis, unspecified without bleeding                        Z98.84, Bariatric surgery status                        R10.13, Epigastric pain CPT copyright 2022 American Medical Association. All rights reserved. The codes documented in this report are preliminary and upon coder review may  be revised to meet current compliance requirements. Leida Puna MD, MD 07/28/2023 12:11:49 PM Number of Addenda: 0 Note Initiated On: 07/28/2023 11:22 AM Estimated Blood Loss:  Estimated blood loss was minimal.      [facility]

## 2023-07-28 NOTE — Op Note (Signed)
 [facility] Gastroenterology Patient Name: Janet [name] Procedure Date: 07/28/2023 11:21 AM MRN: [id number] Account #: [id number] Date of Birth: 12/30/1950 Admit Type: Outpatient Age: 73 Room: [facility] ENDO ROOM 3 Gender: Female Note Status: Finalized Instrument Name: Charlyn Cooley [id number] Procedure:             Colonoscopy Indications:           Surveillance: Personal history of colonic polyps                         (unknown histology) on last colonoscopy 5 years ago Providers:             Leida Puna MD, MD Referring MD:          Leida Puna MD, MD (Referring MD), Raina Bunting (Referring MD) Medicines:             Monitored Anesthesia Care Complications:         No immediate complications. Estimated blood loss:                         Minimal. Procedure:             Pre-Anesthesia Assessment:                        - Prior to the procedure, a History and Physical was                         performed, and patient medications and allergies were                         reviewed. The patient is competent. The risks and                         benefits of the procedure and the sedation options and                         risks were discussed with the patient. All questions                         were answered and informed consent was obtained.                         Patient identification and proposed procedure were                         verified by the physician, the nurse, the                         anesthesiologist, the anesthetist and the technician                         in the endoscopy suite. Mental Status Examination:                         alert and oriented. Airway Examination: normal  oropharyngeal airway and neck mobility. Respiratory                         Examination: clear to auscultation. CV Examination:                         normal. Prophylactic Antibiotics: The patient does not                          require prophylactic antibiotics. Prior                         Anticoagulants: The patient has taken no anticoagulant                         or antiplatelet agents. ASA Grade Assessment: III - A                         patient with severe systemic disease. After reviewing                         the risks and benefits, the patient was deemed in                         satisfactory condition to undergo the procedure. The                         anesthesia plan was to use monitored anesthesia care                         (MAC). Immediately prior to administration of                         medications, the patient was re-assessed for adequacy                         to receive sedatives. The heart rate, respiratory                         rate, oxygen saturations, blood pressure, adequacy of                         pulmonary ventilation, and response to care were                         monitored throughout the procedure. The physical                         status of the patient was re-assessed after the                         procedure.                        After obtaining informed consent, the colonoscope was                         passed under direct vision. Throughout the procedure,  the patient's blood pressure, pulse, and oxygen                         saturations were monitored continuously. The                         Colonoscope was introduced through the anus and                         advanced to the the cecum, identified by appendiceal                         orifice and ileocecal valve. The colonoscopy was                         somewhat difficult due to a redundant colon. The                         patient tolerated the procedure well. The quality of                         the bowel preparation was adequate to identify polyps.                         The ileocecal valve, appendiceal orifice, and rectum                          were photographed. Findings:      The perianal and digital rectal examinations were normal.      A foreign body was found in the ascending colon. Removal of dental drill       bit was accomplished with a snare. Estimated blood loss: none.      Two sessile polyps were found in the transverse colon, proximal       transverse colon and hepatic flexure. The polyps were 2 to 3 mm in size.       These polyps were removed with a cold snare. Resection and retrieval       were complete. Estimated blood loss was minimal.      Multiple small-mouthed diverticula were found in the sigmoid colon and       descending colon.      Internal hemorrhoids were found during retroflexion. The hemorrhoids       were Grade I (internal hemorrhoids that do not prolapse).      The exam was otherwise without abnormality on direct and retroflexion       views. Impression:            - Foreign body in the ascending colon. Removal was                         successful.                        - Two 2 to 3 mm polyps in the transverse colon, in the                         proximal transverse colon and at the hepatic flexure,  removed with a cold snare. Resected and retrieved.                        - Diverticulosis in the sigmoid colon and in the                         descending colon.                        - Internal hemorrhoids.                        - The examination was otherwise normal on direct and                         retroflexion views. Recommendation:        - Discharge patient to home.                        - Resume previous diet.                        - Continue present medications.                        - Await pathology results.                        - Repeat colonoscopy in 5 years for surveillance.                        - Return to referring physician as previously                         scheduled. Procedure Code(s):     --- Professional ---                         [phone], Colonoscopy, flexible; with removal of                         tumor(s), polyp(s), or other lesion(s) by snare                         technique                        [phone], Colonoscopy, flexible; with removal of foreign                         body(s) Diagnosis Code(s):     --- Professional ---                        Z86.010, Personal history of colonic polyps                        T18.4XXA, Foreign body in colon, initial encounter                        D12.3, Benign neoplasm of transverse colon (hepatic  flexure or splenic flexure)                        K64.0, First degree hemorrhoids                        K57.30, Diverticulosis of large intestine without                         perforation or abscess without bleeding CPT copyright 2022 American Medical Association. All rights reserved. The codes documented in this report are preliminary and upon coder review may  be revised to meet current compliance requirements. Leida Puna MD, MD 07/28/2023 12:16:05 PM Number of Addenda: 0 Note Initiated On: 07/28/2023 11:21 AM Scope Withdrawal Time: 0 hours 17 minutes 43 seconds  Total Procedure Duration: 0 hours 23 minutes 58 seconds  Estimated Blood Loss:  Estimated blood loss was minimal.      [facility]

## 2023-07-28 NOTE — Interval H&P Note (Signed)
 History and Physical Interval Note:  07/28/2023 11:23 AM  Janet [name]  has presented today for surgery, with the diagnosis of upper abdominal pain, hx of colonic polyp.  The various methods of treatment have been discussed with the patient and family. After consideration of risks, benefits and other options for treatment, the patient has consented to  Procedure(s) with comments: COLONOSCOPY (N/A) - NO INTERPRETER NEEDED EGD (ESOPHAGOGASTRODUODENOSCOPY) (N/A) as a surgical intervention.  The patient's history has been reviewed, patient examined, no change in status, stable for surgery.  I have reviewed the patient's chart and labs.  Questions were answered to the patient's satisfaction.     Shane Darling  Ok to proceed with EGD/Colonoscopy

## 2023-07-31 LAB — SURGICAL PATHOLOGY

## 2023-07-31 NOTE — Anesthesia Postprocedure Evaluation (Signed)
 Anesthesia Post Note  Patient: Janet [name]  Procedure(s) Performed: COLONOSCOPY EGD (ESOPHAGOGASTRODUODENOSCOPY)  Patient location during evaluation: Endoscopy Anesthesia Type: General Level of consciousness: awake and alert Pain management: pain level controlled Vital Signs Assessment: post-procedure vital signs reviewed and stable Respiratory status: spontaneous breathing, nonlabored ventilation and respiratory function stable Cardiovascular status: blood pressure returned to baseline and stable Postop Assessment: no apparent nausea or vomiting Anesthetic complications: no   No notable events documented.   Last Vitals:  Vitals:   07/28/23 1213 07/28/23 1223  BP: (!) 89/46 (!) 90/56  Pulse:    Resp: 13   Temp:    SpO2: 100%     Last Pain:  Vitals:   07/29/23 1637  TempSrc:   PainSc: 0-No pain                 Baltazar Bonier

## 2023-08-13 ENCOUNTER — Other Ambulatory Visit: Payer: Self-pay | Admitting: Family Medicine

## 2023-08-13 DIAGNOSIS — I6522 Occlusion and stenosis of left carotid artery: Secondary | ICD-10-CM

## 2023-08-13 DIAGNOSIS — I700 Atherosclerosis of aorta: Secondary | ICD-10-CM

## 2023-08-15 NOTE — Telephone Encounter (Signed)
 Too soon for refill.  Requested Prescriptions  Pending Prescriptions Disp Refills   REPATHA  SURECLICK 140 MG/ML SOAJ [Pharmacy Med Name: REPATHA  140 MG/ML SURECLICK]  5    Sig: INJECT 140 MG INTO THE SKIN EVERY 14 (FOURTEEN) DAYS.     Cardiovascular: PCSK9 Inhibitors Failed - 08/15/2023  4:27 PM      Failed - Valid encounter within last 12 months    Recent Outpatient Visits           4 months ago Gastroesophageal reflux disease without esophagitis   [facility] [place] [place], Kayleen Party, DO              Passed - Lipid Panel completed within the last 12 months    Cholesterol  Date Value Ref Range Status  12/02/2022 170 <200 mg/dL Final   LDL Cholesterol (Calc)  Date Value Ref Range Status  12/02/2022 99 mg/dL (calc) Final    Comment:    Reference range: <100 . Desirable range <100 mg/dL for primary prevention;   <70 mg/dL for patients with CHD or diabetic patients  with > or = 2 CHD risk factors. Aaron Aas LDL-C is now calculated using the Martin-Hopkins  calculation, which is a validated novel method providing  better accuracy than the Friedewald equation in the  estimation of LDL-C.  Melinda Sprawls et al. Erroll Heard. 1610;960(45): 2061-2068  (http://education.[facility].com/faq/FAQ164)    HDL  Date Value Ref Range Status  12/02/2022 56 > OR = 50 mg/dL Final   Triglycerides  Date Value Ref Range Status  12/02/2022 66 <150 mg/dL Final

## 2023-08-16 DIAGNOSIS — G5601 Carpal tunnel syndrome, right upper limb: Secondary | ICD-10-CM | POA: Diagnosis not present

## 2023-08-30 DIAGNOSIS — H35462 Secondary vitreoretinal degeneration, left eye: Secondary | ICD-10-CM | POA: Diagnosis not present

## 2023-08-30 DIAGNOSIS — H43813 Vitreous degeneration, bilateral: Secondary | ICD-10-CM | POA: Diagnosis not present

## 2023-08-30 DIAGNOSIS — H31091 Other chorioretinal scars, right eye: Secondary | ICD-10-CM | POA: Diagnosis not present

## 2023-09-06 DIAGNOSIS — I495 Sick sinus syndrome: Secondary | ICD-10-CM | POA: Diagnosis not present

## 2023-09-06 DIAGNOSIS — Z950 Presence of cardiac pacemaker: Secondary | ICD-10-CM | POA: Diagnosis not present

## 2023-09-18 ENCOUNTER — Inpatient Hospital Stay: Attending: Oncology

## 2023-09-18 DIAGNOSIS — B372 Candidiasis of skin and nail: Secondary | ICD-10-CM | POA: Insufficient documentation

## 2023-09-18 DIAGNOSIS — D72820 Lymphocytosis (symptomatic): Secondary | ICD-10-CM | POA: Insufficient documentation

## 2023-09-18 DIAGNOSIS — D508 Other iron deficiency anemias: Secondary | ICD-10-CM | POA: Insufficient documentation

## 2023-09-18 DIAGNOSIS — Z9884 Bariatric surgery status: Secondary | ICD-10-CM | POA: Insufficient documentation

## 2023-09-18 DIAGNOSIS — Z808 Family history of malignant neoplasm of other organs or systems: Secondary | ICD-10-CM | POA: Insufficient documentation

## 2023-09-18 DIAGNOSIS — I872 Venous insufficiency (chronic) (peripheral): Secondary | ICD-10-CM | POA: Insufficient documentation

## 2023-09-18 DIAGNOSIS — R590 Localized enlarged lymph nodes: Secondary | ICD-10-CM | POA: Diagnosis not present

## 2023-09-18 DIAGNOSIS — R61 Generalized hyperhidrosis: Secondary | ICD-10-CM | POA: Diagnosis not present

## 2023-09-18 DIAGNOSIS — Z87891 Personal history of nicotine dependence: Secondary | ICD-10-CM | POA: Insufficient documentation

## 2023-09-18 LAB — CBC WITH DIFFERENTIAL (CANCER CENTER ONLY)
Abs Immature Granulocytes: 0.02 K/uL (ref 0.00–0.07)
Basophils Absolute: 0 K/uL (ref 0.0–0.1)
Basophils Relative: 0 %
Eosinophils Absolute: 0.4 K/uL (ref 0.0–0.5)
Eosinophils Relative: 6 %
HCT: 39.5 % (ref 36.0–46.0)
Hemoglobin: 12.3 g/dL (ref 12.0–15.0)
Immature Granulocytes: 0 %
Lymphocytes Relative: 28 %
Lymphs Abs: 2 K/uL (ref 0.7–4.0)
MCH: 28.4 pg (ref 26.0–34.0)
MCHC: 31.1 g/dL (ref 30.0–36.0)
MCV: 91.2 fL (ref 80.0–100.0)
Monocytes Absolute: 0.7 K/uL (ref 0.1–1.0)
Monocytes Relative: 10 %
Neutro Abs: 4 K/uL (ref 1.7–7.7)
Neutrophils Relative %: 56 %
Platelet Count: 259 K/uL (ref 150–400)
RBC: 4.33 MIL/uL (ref 3.87–5.11)
RDW: 15.7 % — ABNORMAL HIGH (ref 11.5–15.5)
WBC Count: 7.3 K/uL (ref 4.0–10.5)
nRBC: 0 % (ref 0.0–0.2)

## 2023-09-18 LAB — RETIC PANEL
Immature Retic Fract: 14 % (ref 2.3–15.9)
RBC.: 4.34 MIL/uL (ref 3.87–5.11)
Retic Count, Absolute: 56 K/uL (ref 19.0–186.0)
Retic Ct Pct: 1.3 % (ref 0.4–3.1)
Reticulocyte Hemoglobin: 30.5 pg (ref >27.9)

## 2023-09-18 LAB — IRON AND TIBC
Iron: 104 ug/dL (ref 28–170)
Saturation Ratios: 20 % (ref 10.4–31.8)
TIBC: 528 ug/dL — ABNORMAL HIGH (ref 250–450)
UIBC: 424 ug/dL

## 2023-09-18 LAB — FERRITIN: Ferritin: 11 ng/mL (ref 11–307)

## 2023-09-21 ENCOUNTER — Encounter: Payer: Self-pay | Admitting: Oncology

## 2023-09-21 ENCOUNTER — Inpatient Hospital Stay (HOSPITAL_BASED_OUTPATIENT_CLINIC_OR_DEPARTMENT_OTHER): Admitting: Oncology

## 2023-09-21 ENCOUNTER — Inpatient Hospital Stay

## 2023-09-21 VITALS — BP 161/81 | HR 70 | Temp 98.6°F | Resp 20 | Wt 233.5 lb

## 2023-09-21 DIAGNOSIS — I872 Venous insufficiency (chronic) (peripheral): Secondary | ICD-10-CM

## 2023-09-21 DIAGNOSIS — D508 Other iron deficiency anemias: Secondary | ICD-10-CM | POA: Diagnosis not present

## 2023-09-21 DIAGNOSIS — B372 Candidiasis of skin and nail: Secondary | ICD-10-CM | POA: Diagnosis not present

## 2023-09-21 DIAGNOSIS — K9589 Other complications of other bariatric procedure: Secondary | ICD-10-CM | POA: Diagnosis not present

## 2023-09-21 DIAGNOSIS — D72820 Lymphocytosis (symptomatic): Secondary | ICD-10-CM | POA: Diagnosis not present

## 2023-09-21 DIAGNOSIS — Z87891 Personal history of nicotine dependence: Secondary | ICD-10-CM | POA: Diagnosis not present

## 2023-09-21 DIAGNOSIS — R590 Localized enlarged lymph nodes: Secondary | ICD-10-CM | POA: Diagnosis not present

## 2023-09-21 DIAGNOSIS — R591 Generalized enlarged lymph nodes: Secondary | ICD-10-CM

## 2023-09-21 DIAGNOSIS — Z9884 Bariatric surgery status: Secondary | ICD-10-CM | POA: Diagnosis not present

## 2023-09-21 DIAGNOSIS — Z808 Family history of malignant neoplasm of other organs or systems: Secondary | ICD-10-CM | POA: Diagnosis not present

## 2023-09-21 DIAGNOSIS — R61 Generalized hyperhidrosis: Secondary | ICD-10-CM | POA: Diagnosis not present

## 2023-09-21 MED ORDER — NYSTATIN 100000 UNIT/GM EX POWD: 1.0000 | Freq: Three times a day (TID) | CUTANEOUS | 2 refills | Status: DC

## 2023-09-21 NOTE — Assessment & Plan Note (Signed)
 Bilateral inguinal lymphadenopathy, likely reactive due to bilateral lower extremity venous insufficiency, skin dermatitis changes. Non pathologically enlarged cervical lymphadenopathy.  Likely reactive. Night sweats, unclear etiology. PET scan showed Mildly prominent bilateral inguinal lymph nodes with no significant hypermetabolic activity. Findings favored reactive. Observation.

## 2023-09-21 NOTE — Assessment & Plan Note (Addendum)
  Lab Results  Component Value Date   HGB 12.3 09/18/2023   TIBC 528 (H) 09/18/2023   IRONPCTSAT 20 09/18/2023   FERRITIN 11 09/18/2023    Borderline ferritin level with normal hemoglobin. Option of IV Venofer treatment was reviewed and discussed with patient.  Rationale and potential side effects were reviewed.  Patient elects to continue oral iron supplementation given that hemoglobin is normal I think that is very reasonable.

## 2023-09-21 NOTE — Assessment & Plan Note (Signed)
 Diagnosis was reviewed with patient.  No treatment needed.  Observation.

## 2023-09-21 NOTE — Assessment & Plan Note (Signed)
 Recommend leg elevation, compression stocking

## 2023-09-21 NOTE — Progress Notes (Signed)
 Hematology/Oncology Consult note [phone] [phone]        REFERRING PROVIDER: Edman Blunt *   CHIEF COMPLAINTS/REASON FOR VISIT:  lymphadenopathy   ASSESSMENT & PLAN:   Monoclonal B-cell lymphocytosis of undetermined significance Diagnosis was reviewed with patient.  No treatment needed.  Observation.   Iron deficiency anemia following bariatric surgery  Lab Results  Component Value Date   HGB 12.3 09/18/2023   TIBC 528 (H) 09/18/2023   IRONPCTSAT 20 09/18/2023   FERRITIN 11 09/18/2023    Borderline ferritin level with normal hemoglobin. Option of IV Venofer treatment was reviewed and discussed with patient.  Rationale and potential side effects were reviewed.  Patient elects to continue oral iron supplementation given that hemoglobin is normal I think that is very reasonable.    Lymphadenopathy Bilateral inguinal lymphadenopathy, likely reactive due to bilateral lower extremity venous insufficiency, skin dermatitis changes. Non pathologically enlarged cervical lymphadenopathy.  Likely reactive. Night sweats, unclear etiology. PET scan showed Mildly prominent bilateral inguinal lymph nodes with no significant hypermetabolic activity. Findings favored reactive. Observation.   Skin candidiasis Recommend nystatin  powder topical 3 times a day.  Prescription sent to pharmacy  Venous insufficiency of both lower extremities Recommend leg elevation, compression stocking   Orders Placed This Encounter  Procedures   CBC with Differential (Cancer Center Only)    Standing Status:   Future    Expected Date:   03/23/2024    Expiration Date:   06/21/2024   Ferritin    Standing Status:   Future    Expected Date:   03/23/2024    Expiration Date:   06/21/2024   Iron and TIBC    Standing Status:   Future    Expected Date:   03/23/2024    Expiration Date:   06/21/2024   Retic Panel    Standing Status:   Future    Expected Date:   03/23/2024     Expiration Date:   06/21/2024   Flow cytometry panel-leukemia/lymphoma work-up    Standing Status:   Future    Expected Date:   03/23/2024    Expiration Date:   06/21/2024   Folate    Standing Status:   Future    Expected Date:   03/23/2024    Expiration Date:   06/21/2024   Vitamin B12    Standing Status:   Future    Expected Date:   03/23/2024    Expiration Date:   06/21/2024   Follow-up 6 months All questions were answered. The patient knows to call the clinic with any problems, questions or concerns.  Zelphia Cap, MD, PhD [facility] Hematology Oncology 09/21/2023   HISTORY OF PRESENTING ILLNESS:   Janet [name] is a  73 y.o.  female with PMH listed below was seen in consultation at the request of  Edman Blunt *  for evaluation of lymphadenopathy  She discovered small round mass in her groin while applying lotion, which led to further investigation.  02/09/2023 CT abdomen pelvis with contrast showed borderline enlarged inguinal lymph nodes.  10 mm left adrenal gland nodule which is indeterminate. Later she also felt a lymph node in her neck, ultrasound neck showed nonpathologically enlarged bilateral cervical lymph nodes.  She experiences night sweats two to three times a week, lasting about half an hour, but does not require changing clothes. No unintentional weight loss, and she attributes her stable weight to frequent eating due to severe acid reflux.  She has a history of gastric bypass surgery,  which affects her nutritional absorption. She is currently taking iron and B12 supplements due to deficiencies, which are likely related to her gastric bypass surgery. She has been on iron supplements for two weeks and has been receiving B12 for a longer period.  She has a history of venous insufficiency, which she believes contributes to chronic inflammation and dermatitis in her lower extremities. This condition causes redness and inflammation, and she experiences chronic dermatitis  and skin rashes. She is [place] and has taught her children to cook traditional foods.  INTERVAL HISTORY Janet [name] is a 73 y.o. female who has above history reviewed by me today presents for follow up visit for monoclonal lymphocytosis of unknown significance, iron deficiency, lymphadenopathy Chronic intermittent groin rash, sometimes painful.  MEDICAL HISTORY:  Past Medical History:  Diagnosis Date   Anxiety    a.) on BZO (clonazepam ) PRN   Atherosclerosis of left carotid artery    Cardiac pacemaker in situ    Cervical myelopathy (HCC)    Cervical spinal stenosis    Congenital kyphosis, cervical region    Depression    Diastolic dysfunction    a.)  TTE 05/2021: EF 90%, mild LVH, trivial MR/TR/PR, G1DD   Esophageal reflux    Esophageal stricture    a.) s/p multiple (2012, 2015, 2017, 2020) dilitation procedures   Fibromyositis    GERD (gastroesophageal reflux disease)    Heart murmur    History of deep vein thrombosis    Hyperlipidemia    Hypertension    Insomnia    Lymphedema    Major depression, recurrent, full remission (HCC)    Osteoarthritis    Osteoporosis    Other secondary kyphosis, cervical region    PAD (peripheral artery disease) (HCC)    Peripheral nerve disease    PONV (postoperative nausea and vomiting)    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 Jude device   Restless legs syndrome    RLS (restless legs syndrome)    a.) takes pramipexole    Sleep apnea    SOB (shortness of breath)    Spinal stenosis of lumbar region    Spondylolisthesis of lumbar region    SUI (stress urinary incontinence, female)    T2DM (type 2 diabetes mellitus) (HCC)    Urinary incontinence    Venous insufficiency of both lower extremities     SURGICAL HISTORY: Past Surgical History:  Procedure Laterality Date   ABDOMINAL HYSTERECTOMY     ANTERIOR CERVICAL DECOMP/DISCECTOMY FUSION N/A 11/03/2021   Procedure: C4-7 ANTERIOR CERVICAL  DISCECTOMY AND FUSION (GLOBUS FORGE);  Surgeon: Clois Fret, MD;  Location: [facility];  Service: Neurosurgery;  Laterality: N/A;   ANTERIOR LATERAL LUMBAR FUSION WITH PERCUTANEOUS SCREW 2 LEVEL N/A 01/26/2022   Procedure: L3-5 LATERAL LUMBAR INTERBODY FUSION WITH POSTERIOR SPINAL FUSION;  Surgeon: Clois Fret, MD;  Location: [facility];  Service: Neurosurgery;  Laterality: N/A;   APPENDECTOMY  2014   APPLICATION OF INTRAOPERATIVE CT SCAN N/A 01/26/2022   Procedure: APPLICATION OF INTRAOPERATIVE CT SCAN;  Surgeon: Clois Fret, MD;  Location: [facility];  Service: Neurosurgery;  Laterality: N/A;   BREAST SURGERY     CARPAL TUNNEL RELEASE Left 1998   COLONOSCOPY N/A 07/28/2023   Procedure: COLONOSCOPY;  Surgeon: Maryruth Ole DASEN, MD;  Location: [facility];  Service: Endoscopy;  Laterality: N/A;  NO INTERPRETER NEEDED   ESOPHAGEAL DILATION  2012   2015, 2017, 2020   ESOPHAGOGASTRODUODENOSCOPY N/A 07/28/2023   Procedure: EGD (ESOPHAGOGASTRODUODENOSCOPY);  Surgeon: Maryruth Ole DASEN, MD;  Location[facility];  Service: Endoscopy;  Laterality: N/A;   GASTRIC BYPASS  2019   HERNIA REPAIR  2019   JOINT REPLACEMENT     left knee manipulation Left 02/05/2019   PACEMAKER IMPLANT  05/2015   Procedure: PACEMAKER INSERTION; Location: [place], [place]; Surgeon: Moody, MD   POLYPECTOMY     SHOULDER SURGERY Left 2014   SHOULDER SURGERY Right 2017   TOTAL KNEE ARTHROPLASTY Right 2018   TOTAL KNEE ARTHROPLASTY Left 10/18/2018   vein removal Bilateral 2012    SOCIAL HISTORY: Social History   Socioeconomic History   Marital status: Married    Spouse name: Jesus Doria   Number of children: 6   Years of education: Not on file   Highest education level: Not on file  Occupational History   Occupation: Homemaker  Tobacco Use   Smoking status: Former    Current packs/day: 0.00    Average packs/day: 1 pack/day for 25.0 years (25.0 ttl pk-yrs)    Types: Cigarettes    Start  date: 46    Quit date: 2007    Years since quitting: 18.5    Passive exposure: Never   Smokeless tobacco: Never  Vaping Use   Vaping status: Never Used  Substance and Sexual Activity   Alcohol use: Never    Comment: very rarely   Drug use: Never   Sexual activity: Not Currently  Other Topics Concern   Not on file  Social History Narrative   Not on file   Social Drivers of Health   Financial Resource Strain: Low Risk  (08/30/2023)   Received [facility]   Overall Financial Resource Strain (CARDIA)    Difficulty of Paying Living Expenses: Not hard at all  Food Insecurity: No Food Insecurity (08/30/2023)   Received from [facility]   Hunger Vital Sign    Within the past 12 months, you worried that your food would run out before you got the money to buy more.: Never true    Within the past 12 months, the food you bought just didn't last and you didn't have money to get more.: Never true  Recent Concern: Food Insecurity - Food Insecurity Present (08/16/2023)   Received from [facility]   Hunger Vital Sign    Within the past 12 months, you worried that your food would run out before you got the money to buy more.: Sometimes true    Within the past 12 months, the food you bought just didn't last and you didn't have money to get more.: Never true  Transportation Needs: No Transportation Needs (08/30/2023)   Received from [facility] - Transportation    In the past 12 months, has lack of transportation kept you from medical appointments or from getting medications?: No    Lack of Transportation (Non-Medical): No  Physical Activity: Inactive (11/18/2022)   Exercise Vital Sign    Days of Exercise per Week: 0 days    Minutes of Exercise per Session: 0 min  Stress: No Stress Concern Present (11/18/2022)   [employer] of Occupational Health - Occupational Stress Questionnaire    Feeling of Stress :  Only a little  Social Connections: Moderately Isolated (11/18/2022)   Social Connection and Isolation Panel    Frequency of Communication with Friends and Family: Twice a week    Frequency of Social Gatherings with Friends and Family: Once a week  Attends Religious Services: More than 4 times per year    Active Member of Clubs or Organizations: No    Attends Banker Meetings: Never    Marital Status: Divorced  Catering manager Violence: Not At Risk (02/09/2023)   Humiliation, Afraid, Rape, and Kick questionnaire    Fear of Current or Ex-Partner: No    Emotionally Abused: No    Physically Abused: No    Sexually Abused: No    FAMILY HISTORY: Family History  Problem Relation Age of Onset   Heart attack Mother 54   Heart disease Father    Thyroid  disease Father    Skin cancer Father    Brain cancer Maternal Grandmother    Breast cancer Neg Hx     ALLERGIES:  is allergic to celebrex  [celecoxib ].  MEDICATIONS:  Current Outpatient Medications  Medication Sig Dispense Refill   aspirin  81 MG EC tablet Take 81 mg by mouth daily.     metoprolol  tartrate (LOPRESSOR ) 25 MG tablet TAKE 1 TABLET BY MOUTH TWICE  DAILY 200 tablet 1   pantoprazole  (PROTONIX ) 40 MG tablet Take 1 tablet (40 mg total) by mouth 2 (two) times daily before a meal. 180 tablet 1   pramipexole  (MIRAPEX ) 1 MG tablet Take 1 tablet (1 mg total) by mouth 2 (two) times daily. 180 tablet 3   pregabalin (LYRICA) 150 MG capsule Take 150mg  twice daily     sucralfate  (CARAFATE ) 1 g tablet Take 1 tablet (1 g total) by mouth 4 (four) times daily -  with meals and at bedtime. 90 tablet 1   valsartan  (DIOVAN ) 80 MG tablet Take 1 tablet (80 mg total) by mouth daily. 90 tablet 3   nystatin  (MYCOSTATIN /NYSTOP ) powder Apply 1 Application topically 3 (three) times daily. 30 g 2   REPATHA  SURECLICK 140 MG/ML SOAJ Inject 140 mg into the skin every 14 (fourteen) days. (Patient not taking: Reported on 09/21/2023) 2 mL 5    traMADol  (ULTRAM ) 50 MG tablet Take 1 tablet (50 mg total) by mouth every 8 (eight) hours as needed for moderate pain (pain score 4-6). 2-3 times daily PRN 90 tablet 3   No current facility-administered medications for this visit.    Review of Systems  Constitutional:  Positive for fatigue. Negative for appetite change, chills and fever.  HENT:   Negative for hearing loss and voice change.   Eyes:  Negative for eye problems.  Respiratory:  Negative for chest tightness and cough.   Cardiovascular:  Positive for leg swelling. Negative for chest pain.  Gastrointestinal:  Negative for abdominal distention, abdominal pain and blood in stool.  Endocrine: Negative for hot flashes.  Genitourinary:  Negative for difficulty urinating and frequency.   Musculoskeletal:  Negative for arthralgias.  Skin:  Positive for rash. Negative for itching.  Neurological:  Negative for extremity weakness.  Hematological:  Negative for adenopathy.  Psychiatric/Behavioral:  Negative for confusion.    PHYSICAL EXAMINATION: ECOG PERFORMANCE STATUS: 1 - Symptomatic but completely ambulatory Vitals:   09/21/23 1345  BP: (!) 161/81  Pulse: 70  Resp: 20  Temp: 98.6 F (37 C)  SpO2: 100%   Filed Weights   09/21/23 1345  Weight: 233 lb 8 oz (105.9 kg)    Physical Exam Constitutional:      General: She is not in acute distress.    Appearance: She is obese.  HENT:     Head: Normocephalic and atraumatic.  Eyes:     General: No scleral icterus. Cardiovascular:  Rate and Rhythm: Normal rate and regular rhythm.     Heart sounds: Normal heart sounds.  Pulmonary:     Effort: Pulmonary effort is normal. No respiratory distress.     Breath sounds: No wheezing.  Abdominal:     General: Bowel sounds are normal. There is no distension.     Palpations: Abdomen is soft.  Musculoskeletal:        General: No deformity. Normal range of motion.     Cervical back: Normal range of motion and neck supple.     Right  lower leg: No edema.     Left lower leg: Edema present.  Skin:    General: Skin is warm and dry.     Findings: No erythema or rash.     Comments: Lower abdomen/groin skin folds erythematous changes Bilateral lower extremity venous insufficiency dermatitis  Neurological:     Mental Status: She is alert and oriented to person, place, and time. Mental status is at baseline.  Psychiatric:        Mood and Affect: Mood normal.     LABORATORY DATA:  I have reviewed the data as listed    Latest Ref Rng & Units 09/18/2023   10:46 AM 05/08/2023    4:02 PM 01/20/2023    2:01 PM  CBC  WBC 4.0 - 10.5 K/uL 7.3  7.7  8.8   Hemoglobin 12.0 - 15.0 g/dL 87.6  87.2  87.1   Hematocrit 36.0 - 46.0 % 39.5  40.8  40.8   Platelets 150 - 400 K/uL 259  292  325       Latest Ref Rng & Units 05/08/2023    4:02 PM 01/20/2023    2:01 PM 12/02/2022   11:04 AM  CMP  Glucose 70 - 99 mg/dL 898  90  890   BUN 8 - 23 mg/dL 23  22  13    Creatinine 0.44 - 1.00 mg/dL 9.28  9.23  9.35   Sodium 135 - 145 mmol/L 138  141  141   Potassium 3.5 - 5.1 mmol/L 3.8  4.8  4.5   Chloride 98 - 111 mmol/L 103  105  105   CO2 22 - 32 mmol/L 26  29  30    Calcium  8.9 - 10.3 mg/dL 8.8  9.8  9.2   Total Protein 6.5 - 8.1 g/dL 7.6  7.5  6.9   Total Bilirubin 0.0 - 1.2 mg/dL 0.8  0.7  0.8   Alkaline Phos 38 - 126 U/L 90     AST 15 - 41 U/L 20  32  14   ALT 0 - 44 U/L 19  29  11        RADIOGRAPHIC STUDIES: I have personally reviewed the radiological images as listed and agreed with the findings in the report. No results found.

## 2023-09-21 NOTE — Assessment & Plan Note (Signed)
 Recommend nystatin powder topical 3 times a day.  Prescription sent to pharmacy

## 2023-09-26 DIAGNOSIS — E119 Type 2 diabetes mellitus without complications: Secondary | ICD-10-CM | POA: Diagnosis not present

## 2023-09-26 DIAGNOSIS — H2513 Age-related nuclear cataract, bilateral: Secondary | ICD-10-CM | POA: Diagnosis not present

## 2023-09-26 DIAGNOSIS — H02885 Meibomian gland dysfunction left lower eyelid: Secondary | ICD-10-CM | POA: Diagnosis not present

## 2023-09-26 DIAGNOSIS — H33321 Round hole, right eye: Secondary | ICD-10-CM | POA: Diagnosis not present

## 2023-09-26 DIAGNOSIS — I10 Essential (primary) hypertension: Secondary | ICD-10-CM | POA: Diagnosis not present

## 2023-09-26 DIAGNOSIS — H02882 Meibomian gland dysfunction right lower eyelid: Secondary | ICD-10-CM | POA: Diagnosis not present

## 2023-09-26 LAB — HM DIABETES EYE EXAM

## 2023-09-28 ENCOUNTER — Other Ambulatory Visit: Payer: Self-pay | Admitting: Family Medicine

## 2023-09-28 DIAGNOSIS — K219 Gastro-esophageal reflux disease without esophagitis: Secondary | ICD-10-CM

## 2023-09-28 NOTE — Telephone Encounter (Signed)
 LOV 04/14/23 within protocol.  Requested Prescriptions  Pending Prescriptions Disp Refills   pantoprazole  (PROTONIX ) 40 MG tablet [Pharmacy Med Name: PANTOPRAZOLE  SOD DR 40 MG TAB] 180 tablet 0    Sig: TAKE 1 TABLET (40 MG TOTAL) BY MOUTH TWICE A DAY BEFORE MEALS     Gastroenterology: Proton Pump Inhibitors Failed - 09/28/2023  3:59 PM      Failed - Valid encounter within last 12 months    Recent Outpatient Visits           5 months ago Gastroesophageal reflux disease without esophagitis   [facility] [place], Marsa PARAS, [place]

## 2023-10-03 ENCOUNTER — Ambulatory Visit: Payer: Self-pay | Admitting: *Deleted

## 2023-10-03 NOTE — Telephone Encounter (Signed)
 Copied from CRM [id number]. Topic: Clinical - Red Word Triage >> Oct 03, 2023  9:27 AM Berwyn MATSU wrote: Red Word that prompted transfer to Nurse Triage: pain on sides and ovaries very painful Reason for Disposition  [1] MODERATE pain (e.g., interferes with normal activities) AND [2] pain comes and goes (cramps) AND [3] present > 24 hours  (Exception: Pain with Vomiting or Diarrhea - see that Guideline.)  Answer Assessment - Initial Assessment Questions 1. LOCATION: Where does it hurt?      I'm having pain in my ovaries and at my waist going around to my back.    The pain in my ovaries is in both sides.   They did a scan at the cancer center and I have knots or something.    Not cancer.   I don't know if that is causing the pain or something different.    On my ovaries.   I don't have an ob-gyn doctor.    The cancer center told me there was no danger or anything to worry about.   But I'm still having the pain. It comes and goes.     I am having lower back pain.  I can't sit or lay down without pain.   This is a different pain from what is described above.   Started a few months ago and it's getting worse.   Over the last week it's gotten worse.   I have lumbar discs I think are messed up.   I don't really know. 2. RADIATION: Does the pain shoot anywhere else? (e.g., chest, back)     See above 3. ONSET: When did the pain begin? (e.g., minutes, hours or days ago)      See above 4. SUDDEN: Gradual or sudden onset?     Not asked 5. PATTERN Does the pain come and go, or is it constant?     It's intermittent for both back and ovary pain.    6. SEVERITY: How bad is the pain?  (e.g., Scale 1-10; mild, moderate, or severe)     It comes and goes 7. RECURRENT SYMPTOM: Have you ever had this type of stomach pain before? If Yes, ask: When was the last time? and What happened that time?     Not asked 8. CAUSE: What do you think is causing the stomach pain? (e.g., gallstones, recent  abdominal surgery)     Something going on with ovaries.   Had a scan done.   I have nodules on neck and different points on my body.   That's why I had the scan.    I see a cancer doctor.    9. RELIEVING/AGGRAVATING FACTORS: What makes it better or worse? (e.g., antacids, bending or twisting motion, bowel movement)     Nothing helps the back pain.    It hurts all the time. 10. OTHER SYMPTOMS: Do you have any other symptoms? (e.g., back pain, diarrhea, fever, urination pain, vomiting)       See above  11. PREGNANCY: Is there any chance you are pregnant? When was your last menstrual period?       N/A due to age  Protocols used: Abdominal Pain - Female-A-AH FYI Only or Action Required?: FYI only for provider.  Patient was last seen in primary care on 04/14/2023 by Edman Marsa PARAS, DO.  Called Nurse Triage reporting Abdominal Pain.ovary pain and lower back pain that is a separate issue.   Ovary pain and back pain getting worse.  Symptoms began about a month ago.  Interventions attempted: Other: Had scan done at the cancer center and told it was not cancer or anything bad/.  Symptoms are: gradually worsening.Ovary pain and lower back pain becoming worse.  Triage Disposition: See Physician Within 24 Hours  Patient/caregiver understands and will follow disposition?: Yes

## 2023-10-04 ENCOUNTER — Encounter: Payer: Self-pay | Admitting: Family Medicine

## 2023-10-04 ENCOUNTER — Ambulatory Visit (INDEPENDENT_AMBULATORY_CARE_PROVIDER_SITE_OTHER): Admitting: Family Medicine

## 2023-10-04 VITALS — BP 130/82 | HR 69 | Ht 60.3 in | Wt 233.5 lb

## 2023-10-04 DIAGNOSIS — G4709 Other insomnia: Secondary | ICD-10-CM

## 2023-10-04 DIAGNOSIS — G2581 Restless legs syndrome: Secondary | ICD-10-CM

## 2023-10-04 DIAGNOSIS — R109 Unspecified abdominal pain: Secondary | ICD-10-CM

## 2023-10-04 DIAGNOSIS — R102 Pelvic and perineal pain: Secondary | ICD-10-CM | POA: Diagnosis not present

## 2023-10-04 DIAGNOSIS — M5442 Lumbago with sciatica, left side: Secondary | ICD-10-CM

## 2023-10-04 DIAGNOSIS — M792 Neuralgia and neuritis, unspecified: Secondary | ICD-10-CM

## 2023-10-04 DIAGNOSIS — G8929 Other chronic pain: Secondary | ICD-10-CM

## 2023-10-04 MED ORDER — TRAMADOL HCL 50 MG PO TABS: 50.0000 mg | ORAL_TABLET | Freq: Three times a day (TID) | ORAL | 2 refills | Status: AC | PRN

## 2023-10-04 MED ORDER — PREGABALIN 100 MG PO CAPS: 100.0000 mg | ORAL_CAPSULE | Freq: Three times a day (TID) | ORAL | 2 refills | Status: AC

## 2023-10-04 NOTE — Patient Instructions (Addendum)
 Thank you for coming to the office today.  Re ordered Tramadol  take up to twice a day as needed  Re ordered Lyrica  Pregabalin  down to 100mg , but take 3 times per day  Follow with other doctors as needed  No ovarian problem seen.  [facility] Address: [address] [place], [place], [place] [address] Phone: [phone]  Please schedule a Follow-up Appointment to: Return if symptoms worsen or fail to improve.  If you have any other questions or concerns, please feel free to call the office or send a message through MyChart. You may also schedule an earlier appointment if necessary.  Additionally, you may be receiving a survey about your experience at our office within a few days to 1 week by e-mail or mail. We value your feedback.  [name], DO [facility], [place]

## 2023-10-04 NOTE — Progress Notes (Signed)
 Subjective:    Patient ID: Janet [name], female    DOB: 12-21-1950, 73 y.o.   MRN: [id number]  Janet [name] is a 73 y.o. female presenting on 10/04/2023 for Abdominal Pain (Having pain for months in lower abdominal and low back )   HPI  Discussed the use of AI scribe software for clinical note transcription with the patient, who gave verbal consent to proceed.  History of Present Illness   Janet [name] is a 73 year old female who presents with persistent abdominal and back pain.  Abdominal pain / Pelvic Pain - Persistent lower abdominal vs pelvic pain - Pain begins as discomfort and intensifies over four to five days before subsiding - Episodes recur after a week or more - Pain is constant during episodes - No relief with eating, drinking, or bowel movements - Pain is distinct from previous acid reflux symptoms - History of hiatal hernia and gastric bypass surgery - Colonoscopy and endoscopy in May revealed esophagitis and inflammation, no hiatal hernia noted  Back pain and generalized musculoskeletal pain - Significant back pain, worsening in the afternoon and evening - Pain affects mood and ability to sleep - Pain present in knees, legs, and arms, sometimes requiring her to stand up at night - Difficulty managing pain without medication  Reviewed all imaging recently not identifying ovarian.  Neuropathic pain and pain management - Currently taking pregabalin  (Lyrica ) 150 mg twice daily, feels need for an additional afternoon dose - Previous use of duloxetine  (Cymbalta ) and gabapentin , not currently taking - Previously prescribed tramadol  and hydrocodone for pain management - Tramadol  is more effective for nerve pain  Imaging and diagnostic findings - PET scan in March showed mildly prominent lymph nodes in the pelvic area - Followed by Hematology/Oncology, not determined to be cancer - Ovaries not considered a concern per prior evaluation     Bilateral Lower Pelvic wave of  pain that can last constantly for days then can resolve and return No abnormal ovaries seen on   Neurology 05/2023 - she was on Duloxetine , Gabapentin  before, now off of these.      10/04/2023    8:17 AM 02/09/2023    9:29 AM 01/20/2023    2:20 PM  Depression screen PHQ 2/9  Decreased Interest 1 1 1   Down, Depressed, Hopeless 1 1 1   PHQ - 2 Score 2 2 2   Altered sleeping 3 3 3   Tired, decreased energy 1 1 1   Change in appetite 2 0 1  Feeling bad or failure about yourself  1 1 0  Trouble concentrating 1 1 1   Moving slowly or fidgety/restless 1 0 0  Suicidal thoughts 1 0 0  PHQ-9 Score 12 8 8   Difficult doing work/chores Somewhat difficult  Somewhat difficult       10/04/2023    8:18 AM 02/09/2023    9:29 AM 01/20/2023    2:20 PM 12/02/2022   10:28 AM  GAD 7 : Generalized Anxiety Score  Nervous, Anxious, on Edge 1 1 1 1   Control/stop worrying 1 0 0 1  Worry too much - different things 1 1 0 1  Trouble relaxing 1 1 1 1   Restless 1 2 1 1   Easily annoyed or irritable 0 1 0 0  Afraid - awful might happen 0 0 0 0  Total GAD 7 Score 5 6 3 5   Anxiety Difficulty Somewhat difficult  Not difficult at all     Social History   Tobacco Use  Smoking status: Former    Current packs/day: 0.00    Average packs/day: 1 pack/day for 25.0 years (25.0 ttl pk-yrs)    Types: Cigarettes    Start date: 33    Quit date: 2007    Years since quitting: 18.6    Passive exposure: Never   Smokeless tobacco: Never  Vaping Use   Vaping status: Never Used  Substance Use Topics   Alcohol use: Never    Comment: very rarely   Drug use: Never    Review of Systems Per HPI unless specifically indicated above     Objective:    BP 130/82 (BP Location: Right Arm, Patient Position: Sitting, Cuff Size: Normal)   Pulse 69   Ht 5' 0.3 (1.532 m)   Wt 233 lb 8 oz (105.9 kg)   SpO2 98%   BMI 45.15 kg/m   Wt Readings from Last 3 Encounters:  10/04/23 233 lb 8 oz (105.9 kg)  09/21/23 233 lb 8 oz  (105.9 kg)  07/28/23 231 lb (104.8 kg)    Physical Exam Vitals and nursing note reviewed.  Constitutional:      General: She is not in acute distress.    Appearance: Normal appearance. She is well-developed. She is not diaphoretic.     Comments: Well-appearing, mild uncomfortable with low back and pelvic pain, cooperative  HENT:     Head: Normocephalic and atraumatic.  Eyes:     General:        Right eye: No discharge.        Left eye: No discharge.     Conjunctiva/sclera: Conjunctivae normal.  Neck:     Thyroid : No thyromegaly.  Cardiovascular:     Rate and Rhythm: Normal rate and regular rhythm.     Heart sounds: Normal heart sounds. No murmur heard. Pulmonary:     Effort: Pulmonary effort is normal. No respiratory distress.     Breath sounds: Normal breath sounds. No wheezing or rales.  Musculoskeletal:        General: Normal range of motion.     Cervical back: Normal range of motion and neck supple.  Lymphadenopathy:     Cervical: No cervical adenopathy.  Skin:    General: Skin is warm and dry.     Findings: No erythema or rash.  Neurological:     Mental Status: She is alert and oriented to person, place, and time.  Psychiatric:        Mood and Affect: Mood normal.        Behavior: Behavior normal.        Thought Content: Thought content normal.     Comments: Well groomed, good eye contact, normal speech and thoughts     Results for orders placed or performed in visit on 09/27/23  HM DIABETES EYE EXAM   Collection Time: 09/26/23  3:10 PM  Result Value Ref Range   HM Diabetic Eye Exam No Retinopathy No Retinopathy      Assessment & Plan:   Problem List Items Addressed This Visit     Other insomnia   Restless leg syndrome   Other Visit Diagnoses       Chronic pelvic pain in female    -  Primary   Relevant Medications   traMADol  (ULTRAM ) 50 MG tablet   pregabalin  (LYRICA ) 100 MG capsule   Other Relevant Orders   Ambulatory referral to Pain Clinic      Chronic left-sided low back pain with left-sided sciatica  Relevant Medications   traMADol  (ULTRAM ) 50 MG tablet   pregabalin  (LYRICA ) 100 MG capsule   Other Relevant Orders   Ambulatory referral to Pain Clinic     Neuropathic pain       Relevant Medications   traMADol  (ULTRAM ) 50 MG tablet   pregabalin  (LYRICA ) 100 MG capsule   Other Relevant Orders   Ambulatory referral to Pain Clinic     Chronic abdominal pain       Relevant Medications   traMADol  (ULTRAM ) 50 MG tablet   pregabalin  (LYRICA ) 100 MG capsule   Other Relevant Orders   Ambulatory referral to Pain Clinic        Chronic pain syndrome Neuropathic Pain vs Fibromyalgia Issues with chronic abdominal pain / epigastric vs lower pelvic pain  Persistent pain in back, legs, and arms, worsening in afternoon and evening. Previous treatments ineffective. Current management inadequate, causing discomfort and mood disturbance. Gastrointestinal and gynecological causes ruled out. - No ovarian issue on imaging. I do not see mention of ovarian cyst - She has had Colonoscopy and Endoscopy without clear etiology, other than GERD / Gastritis that is treated.  Her pain seems more chronic related to back pain possibly radiating to pelvic lower pain. Seems inadequately treated on current regimen.  I agreed to adjust her medication today, instead of TWICE A DAY dosing, can go to THREE TIMES A DAY dosing on Pregabalin , lower dose from 150 TWICE A DAY over to 100 THREE TIMES A DAY   - Adjust pregabalin  to 100 mg three times daily. - Add tramadol  as needed, up to twice daily, for pain management. - Refer to pain management specialist for further evaluation and management.  Referral to [facility]  Mood disturbance Mood disturbance likely secondary to chronic pain, with increased mood scores and restlessness, particularly in the afternoon and evening. - Monitor mood in conjunction with pain management  strategies.  Obesity Obesity potentially exacerbating pain symptoms. Weight gain due to eating as a coping mechanism for pain.         Orders Placed This Encounter  Procedures   Ambulatory referral to Pain Clinic    Referral Priority:   Routine    Referral Type:   Consultation    Referral Reason:   Specialty Services Required    Requested Specialty:   Pain Medicine    Number of Visits Requested:   1    Meds ordered this encounter  Medications   traMADol  (ULTRAM ) 50 MG tablet    Sig: Take 1 tablet (50 mg total) by mouth every 8 (eight) hours as needed.    Dispense:  60 tablet    Refill:  2   pregabalin  (LYRICA ) 100 MG capsule    Sig: Take 1 capsule (100 mg total) by mouth 3 (three) times daily.    Dispense:  90 capsule    Refill:  2    Dose adjustment    Follow up plan: Return if symptoms worsen or fail to improve.   [name], DO [facility] 10/04/2023, 8:34 AM

## 2023-10-11 ENCOUNTER — Ambulatory Visit

## 2023-10-24 ENCOUNTER — Ambulatory Visit: Admitting: Family Medicine

## 2023-11-24 ENCOUNTER — Ambulatory Visit: Payer: Medicare HMO

## 2023-11-24 DIAGNOSIS — Z599 Problem related to housing and economic circumstances, unspecified: Secondary | ICD-10-CM

## 2023-11-24 DIAGNOSIS — Z0000 Encounter for general adult medical examination without abnormal findings: Secondary | ICD-10-CM | POA: Diagnosis not present

## 2023-11-24 NOTE — Progress Notes (Signed)
 Subjective:   Janet [name] is a 73 y.o. who presents for a Medicare Wellness preventive visit.  As a reminder, Annual Wellness Visits don't include a physical exam, and some assessments may be limited, especially if this visit is performed virtually. We may recommend an in-person follow-up visit with your provider if needed.  Visit Complete: Virtual I connected with  Janet [name] on 11/24/23 by a audio enabled telemedicine application and verified that I am speaking with the correct person using two identifiers.  Patient Location: Home  Provider Location: Home Office  I discussed the limitations of evaluation and management by telemedicine. The patient expressed understanding and agreed to proceed.  Vital Signs: Because this visit was a virtual/telehealth visit, some criteria may be missing or patient reported. Any vitals not documented were not able to be obtained and vitals that have been documented are patient reported.  VideoDeclined- This patient declined Librarian, academic. Therefore the visit was completed with audio only.  Persons Participating in Visit: Patient.  AWV Questionnaire: No: Patient Medicare AWV questionnaire was not completed prior to this visit.  Cardiac Risk Factors include: advanced age (>17men, >23 women);dyslipidemia;obesity (BMI >30kg/m2);sedentary lifestyle     Objective:    Today's Vitals   11/24/23 1525  PainSc: 4    There is no height or weight on file to calculate BMI.     11/24/2023    3:31 PM 09/21/2023    1:46 PM 05/08/2023    3:23 PM 11/18/2022    9:19 AM 01/26/2022    7:59 AM 12/24/2021   10:43 AM 11/03/2021   10:30 AM  Advanced Directives  Does Patient Have a Medical Advance Directive? No Yes No No Yes Yes Yes  Type of Advance Directive  Living will;Healthcare Power of Attorney   Living will Living will Living will  Does patient want to make changes to medical advance directive?  Yes (ED - Information included in  AVS)   No - Patient declined  No - Patient declined  Would patient like information on creating a medical advance directive? No - Patient declined Yes (ED - Information included in AVS) No - Patient declined No - Patient declined       Current Medications (verified) Outpatient Encounter Medications as of 11/24/2023  Medication Sig   aspirin  81 MG EC tablet Take 81 mg by mouth daily.   Cyanocobalamin  (VITAMIN B 12 PO) Take by mouth.   Iron Combinations (IRON COMPLEX PO) Take by mouth.   metoprolol  tartrate (LOPRESSOR ) 25 MG tablet TAKE 1 TABLET BY MOUTH TWICE  DAILY   nystatin  (MYCOSTATIN /NYSTOP ) powder Apply 1 Application topically 3 (three) times daily.   pramipexole  (MIRAPEX ) 1 MG tablet Take 1 tablet (1 mg total) by mouth 2 (two) times daily.   pregabalin  (LYRICA ) 100 MG capsule Take 1 capsule (100 mg total) by mouth 3 (three) times daily.   traMADol  (ULTRAM ) 50 MG tablet Take 1 tablet (50 mg total) by mouth every 8 (eight) hours as needed.   valsartan  (DIOVAN ) 80 MG tablet Take 1 tablet (80 mg total) by mouth daily.   VITAMIN D, CHOLECALCIFEROL, PO Take by mouth.   pantoprazole  (PROTONIX ) 40 MG tablet TAKE 1 TABLET (40 MG TOTAL) BY MOUTH TWICE A DAY BEFORE MEALS (Patient not taking: Reported on 11/24/2023)   sucralfate  (CARAFATE ) 1 g tablet Take 1 tablet (1 g total) by mouth 4 (four) times daily -  with meals and at bedtime. (Patient not taking: Reported on 11/24/2023)  No facility-administered encounter medications on file as of 11/24/2023.    Allergies (verified) Celebrex  [celecoxib ]   History: Past Medical History:  Diagnosis Date   Anxiety    a.) on BZO (clonazepam ) PRN   Atherosclerosis of left carotid artery    Cardiac pacemaker in situ    Cervical myelopathy (HCC)    Cervical spinal stenosis    Congenital kyphosis, cervical region    Depression    Diastolic dysfunction    a.)  TTE 05/2021: EF 90%, mild LVH, trivial MR/TR/PR, G1DD   Esophageal reflux    Esophageal  stricture    a.) s/p multiple (2012, 2015, 2017, 2020) dilitation procedures   Fibromyositis    GERD (gastroesophageal reflux disease)    Heart murmur    History of deep vein thrombosis    Hyperlipidemia    Hypertension    Insomnia    Lymphedema    Major depression, recurrent, full remission    Osteoarthritis    Osteoporosis    Other secondary kyphosis, cervical region    PAD (peripheral artery disease)    Peripheral nerve disease    PONV (postoperative nausea and vomiting)    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 Jude device   Restless legs syndrome    RLS (restless legs syndrome)    a.) takes pramipexole    Sleep apnea    SOB (shortness of breath)    Spinal stenosis of lumbar region    Spondylolisthesis of lumbar region    SUI (stress urinary incontinence, female)    T2DM (type 2 diabetes mellitus) (HCC)    Urinary incontinence    Venous insufficiency of both lower extremities    Past Surgical History:  Procedure Laterality Date   ABDOMINAL HYSTERECTOMY     ANTERIOR CERVICAL DECOMP/DISCECTOMY FUSION N/A 11/03/2021   Procedure: C4-7 ANTERIOR CERVICAL DISCECTOMY AND FUSION (GLOBUS FORGE);  Surgeon: Clois Fret, MD;  Location: [facility];  Service: Neurosurgery;  Laterality: N/A;   ANTERIOR LATERAL LUMBAR FUSION WITH PERCUTANEOUS SCREW 2 LEVEL N/A 01/26/2022   Procedure: L3-5 LATERAL LUMBAR INTERBODY FUSION WITH POSTERIOR SPINAL FUSION;  Surgeon: Clois Fret, MD;  Location: [facility];  Service: Neurosurgery;  Laterality: N/A;   APPENDECTOMY  2014   APPLICATION OF INTRAOPERATIVE CT SCAN N/A 01/26/2022   Procedure: APPLICATION OF INTRAOPERATIVE CT SCAN;  Surgeon: Clois Fret, MD;  Location: [facility];  Service: Neurosurgery;  Laterality: N/A;   BREAST SURGERY     CARPAL TUNNEL RELEASE Left 1998   COLONOSCOPY N/A 07/28/2023   Procedure: COLONOSCOPY;  Surgeon: Maryruth Ole DASEN, MD;  Location: [facility];  Service: Endoscopy;  Laterality:  N/A;  NO INTERPRETER NEEDED   ESOPHAGEAL DILATION  2012   2015, 2017, 2020   ESOPHAGOGASTRODUODENOSCOPY N/A 07/28/2023   Procedure: EGD (ESOPHAGOGASTRODUODENOSCOPY);  Surgeon: Maryruth Ole DASEN, MD;  Location: [facility] END[facility]COPY;  Service: Endoscopy;  Laterality: N/A;   GASTRIC BYPASS  2019   HERNIA REPAIR  2019   JOINT REPLACEMENT     left knee manipulation Left 02/05/2019   PACEMAKER IMPLANT  05/2015   Procedure: PACEMAKER INSERTION; Location: [place], [place]; Surgeon: Moody, MD   POLYPECTOMY     SHOULDER SURGERY Left 2014   SHOULDER SURGERY Right 2017   TOTAL KNEE ARTHROPLASTY Right 2018   TOTAL KNEE ARTHROPLASTY Left 10/18/2018   vein removal Bilateral 2012   Family History  Problem Relation Age of Onset   Heart attack Mother 49   Heart disease Father    Thyroid  disease  Father    Skin cancer Father    Brain cancer Maternal Grandmother    Breast cancer Neg Hx    Social History   Socioeconomic History   Marital status: Married    Spouse name: Jesus Botsford   Number of children: 6   Years of education: Not on file   Highest education level: Not on file  Occupational History   Occupation: Homemaker  Tobacco Use   Smoking status: Former    Current packs/day: 0.00    Average packs/day: 1 pack/day for 25.0 years (25.0 ttl pk-yrs)    Types: Cigarettes    Start date: 74    Quit date: 2007    Years since quitting: 18.7    Passive exposure: Never   Smokeless tobacco: Never  Vaping Use   Vaping status: Never Used  Substance and Sexual Activity   Alcohol use: Never    Comment: very rarely   Drug use: Never   Sexual activity: Not Currently  Other Topics Concern   Not on file  Social History Narrative   Not on file   Social Drivers of Health   Financial Resource Strain: Medium Risk (11/24/2023)   Overall Financial Resource Strain (CARDIA)    Difficulty of Paying Living Expenses: Somewhat hard  Food Insecurity: Food Insecurity Present (11/24/2023)   Hunger Vital Sign     Worried About Running Out of Food in the Last Year: Sometimes true    Ran Out of Food in the Last Year: Never true  Transportation Needs: No Transportation Needs (11/24/2023)   PRAPARE - Administrator, Civil Service (Medical): No    Lack of Transportation (Non-Medical): No  Physical Activity: Inactive (11/24/2023)   Exercise Vital Sign    Days of Exercise per Week: 0 days    Minutes of Exercise per Session: 0 min  Stress: No Stress Concern Present (11/24/2023)   [employer] of Occupational Health - Occupational Stress Questionnaire    Feeling of Stress: Not at all  Social Connections: Unknown (11/24/2023)   Social Connection and Isolation Panel    Frequency of Communication with Friends and Family: Not on file    Frequency of Social Gatherings with Friends and Family: Not on file    Attends Religious Services: Not on file    Active Member of Clubs or Organizations: Not on file    Attends Banker Meetings: Not on file    Marital Status: Separated  Recent Concern: Social Connections - Socially Isolated (11/24/2023)   Social Connection and Isolation Panel    Frequency of Communication with Friends and Family: More than three times a week    Frequency of Social Gatherings with Friends and Family: Once a week    Attends Religious Services: Never    Database administrator or Organizations: No    Attends Engineer, structural: Never    Marital Status: Divorced    Tobacco Counseling Counseling given: Not Answered    Clinical Intake:  Pre-visit preparation completed: Yes  Pain : 0-10 Pain Score: 4  Pain Type: Chronic pain Pain Location: Leg Pain Orientation: Right, Left Pain Descriptors / Indicators: Aching, Burning, Constant Pain Onset: More than a month ago Pain Frequency: Constant     BMI - recorded: 45.5 Nutritional Status: BMI > 30  Obese Nutritional Risks: None Diabetes: No  Lab Results  Component Value Date   HGBA1C 5.6  03/07/2023   HGBA1C 6.2 (H) 12/02/2022   HGBA1C 5.9 (H) 09/07/2021  How often do you need to have someone help you when you read instructions, pamphlets, or other written materials from your doctor or pharmacy?: 1 - Never  Interpreter Needed?: No  Information entered by :: JHONNIE DAS, LPN   Activities of Daily Living    11/24/2023    3:33 PM 12/02/2022   10:28 AM  In your present state of health, do you have any difficulty performing the following activities:  Hearing? 0 0  Vision? 0 0  Difficulty concentrating or making decisions? 1 0  Comment MEMORY   Walking or climbing stairs? 1 1  Dressing or bathing? 0 0  Doing errands, shopping? 0 0  Preparing Food and eating ? N   Using the Toilet? N   In the past six months, have you accidently leaked urine? N   Do you have problems with loss of bowel control? N   Managing your Medications? N   Managing your Finances? N   Housekeeping or managing your Housekeeping? N     Patient Care Team: Edman Marsa PARAS, DO as PCP - General (Family Medicine) [place], [facility] [place] Babara Call, MD as Consulting Physician (Oncology)  I have updated your Care Teams any recent Medical Services you may have received from other providers in the past year.     Assessment:   This is a routine wellness examination for Giannah.  Hearing/Vision screen Hearing Screening - Comments:: NO AIDS Vision Screening - Comments:: WEARS GLASSES ALL DAY- [place]   Goals Addressed             This Visit's Progress    DIET - INCREASE WATER  INTAKE         Depression Screen     11/24/2023    3:29 PM 10/04/2023    8:17 AM 02/09/2023    9:29 AM 01/20/2023    2:20 PM 12/02/2022   10:27 AM 11/18/2022    9:18 AM 07/01/2022    8:59 AM  PHQ 2/9 Scores  PHQ - 2 Score 1 2 2 2 1  0 0  PHQ- 9 Score 2 12 8 8 8  0     Fall Risk     11/24/2023    3:32 PM 10/04/2023    8:17 AM 02/09/2023    9:28 AM 01/20/2023    2:20 PM 12/02/2022   10:28 AM  Fall Risk    Falls in the past year? 1 1 1 1 1   Number falls in past yr: 1 0 1 1 1   Injury with Fall? 0 0 0 0 0  Risk for fall due to : Impaired balance/gait Impaired balance/gait     Follow up Falls evaluation completed;Falls prevention discussed        MEDICARE RISK AT HOME:  Medicare Risk at Home Any stairs in or around the home?: Yes If so, are there any without handrails?: No Home free of loose throw rugs in walkways, pet beds, electrical cords, etc?: Yes Adequate lighting in your home to reduce risk of falls?: Yes Life alert?: No Use of a cane, walker or w/c?: Yes (CANE SOMETIMES) Grab bars in the bathroom?: Yes Shower chair or bench in shower?: No Elevated toilet seat or a handicapped toilet?: Yes  TIMED UP AND GO:  Was the test performed?  No  Cognitive Function: 6CIT completed        11/24/2023    3:35 PM 11/18/2022    9:22 AM 11/08/2021    2:36 PM 10/20/2020    9:20 AM  6CIT Screen  What Year? 0 points 0 points 0 points 0 points  What month? 0 points 0 points 0 points 0 points  What time? 0 points 0 points 0 points 0 points  Count back from 20 0 points 0 points 0 points 0 points  Months in reverse 0 points 0 points 0 points 0 points  Repeat phrase 0 points 0 points 2 points 4 points  Total Score 0 points 0 points 2 points 4 points    Immunizations Immunization History  Administered Date(s) Administered   Fluad Quad(high Dose 65+) 03/03/2020   Fluad Trivalent(High Dose 65+) 12/02/2022   INFLUENZA, HIGH DOSE SEASONAL PF 01/09/2018, 11/21/2018, 11/21/2018, 12/02/2021   Influenza, Seasonal, Injecte, Preservative Fre 12/16/2014   Moderna Sars-Covid-2 Vaccination 03/27/2019, 04/26/2019   PFIZER(Purple Top)SARS-COV-2 Vaccination 01/09/2020, 07/08/2020   Pneumococcal Conjugate-13 01/07/2018   Pneumococcal Polysaccharide-23 09/19/2019   Zoster Recombinant(Shingrix ) 07/09/2020, 09/21/2020    Screening Tests Health Maintenance  Topic Date Due   DTaP/Tdap/Td (1 - Tdap) Never  done   HEMOGLOBIN A1C  09/04/2023   Diabetic kidney evaluation - Urine ACR  09/16/2023   Influenza Vaccine  09/29/2023   COVID-19 Vaccine (5 - 2025-26 season) 10/30/2023   FOOT EXAM  12/02/2023   Diabetic kidney evaluation - eGFR measurement  05/07/2024   OPHTHALMOLOGY EXAM  09/25/2024   Medicare Annual Wellness (AWV)  11/23/2024   Mammogram  05/09/2025   DEXA SCAN  03/07/2028   Colonoscopy  07/27/2028   Pneumococcal Vaccine: 50+ Years  Completed   Hepatitis C Screening  Completed   Zoster Vaccines- Shingrix   Completed   HPV VACCINES  Aged Out   Meningococcal B Vaccine  Aged Out    Health Maintenance Items Addressed: UP TO DATE ON PNA & SHINGRIX ; NEEDS FLU & TDAP- DECLINES COVID; MAMMOGRAM, BDS & COLONOSCOPY UP TO DATE  Additional Screening:  Vision Screening: Recommended annual ophthalmology exams for early detection of glaucoma and other disorders of the eye. Is the patient up to date with their annual eye exam?  Yes  Who is the provider or what is the name of the office in which the patient attends annual eye exams? [facility]  Dental Screening: Recommended annual dental exams for proper oral hygiene  Community Resource Referral / Chronic Care Management: CRR required this visit?  No   CCM required this visit?  No   Plan:    I have personally reviewed and noted the following in the patient's chart:   Medical and social history Use of alcohol, tobacco or illicit drugs  Current medications and supplements including opioid prescriptions. Patient is not currently taking opioid prescriptions. Functional ability and status Nutritional status Physical activity Advanced directives List of other physicians Hospitalizations, surgeries, and ER visits in previous 12 months Vitals Screenings to include cognitive, depression, and falls Referrals and appointments  In addition, I have reviewed and discussed with patient certain preventive protocols, quality metrics, and best  practice recommendations. A written personalized care plan for preventive services as well as general preventive health recommendations were provided to patient.   Jhonnie GORMAN Das, LPN   [id number]   After Visit Summary: (MyChart) Due to this being a telephonic visit, the after visit summary with patients personalized plan was offered to patient via MyChart   Notes: REFERRAL FOR FINANCIAL DIFFICULTIES

## 2023-11-24 NOTE — Patient Instructions (Addendum)
 [name],  Thank you for taking the time for your Medicare Wellness Visit. I appreciate your continued commitment to your health goals. Please review the care plan we discussed, and feel free to reach out if I can assist you further.  Medicare recommends these wellness visits once per year to help you and your care team stay ahead of potential health issues. These visits are designed to focus on prevention, allowing your provider to concentrate on managing your acute and chronic conditions during your regular appointments.  Please note that Annual Wellness Visits do not include a physical exam. Some assessments may be limited, especially if the visit was conducted virtually. If needed, we may recommend a separate in-person follow-up with your provider.  Ongoing Care Seeing your primary care provider every 3 to 6 months helps us  monitor your health and provide consistent, personalized care.   Referrals If a referral was made during today's visit and you haven't received any updates within two weeks, please contact the referred provider directly to check on the status.  Recommended Screenings:  Health Maintenance  Topic Date Due   DTaP/Tdap/Td vaccine (1 - Tdap) Never done   Hemoglobin A1C  09/04/2023   Yearly kidney health urinalysis for diabetes  09/16/2023   Flu Shot  09/29/2023   COVID-19 Vaccine (5 - 2025-26 season) 10/30/2023   Complete foot exam   12/02/2023   Yearly kidney function blood test for diabetes  05/07/2024   Eye exam for diabetics  09/25/2024   Medicare Annual Wellness Visit  11/23/2024   Breast Cancer Screening  05/09/2025   DEXA scan (bone density measurement)  03/07/2028   Colon Cancer Screening  07/27/2028   Pneumococcal Vaccine for age over 43  Completed   Hepatitis C Screening  Completed   Zoster (Shingles) Vaccine  Completed   HPV Vaccine  Aged Out   Meningitis B Vaccine  Aged Out     Advance Care Planning is important because it: Ensures you receive  medical care that aligns with your values, goals, and preferences. Provides guidance to your family and loved ones, reducing the emotional burden of decision-making during critical moments.  Vision: Annual vision screenings are recommended for early detection of glaucoma, cataracts, and diabetic retinopathy. These exams can also reveal signs of chronic conditions such as diabetes and high blood pressure.  Dental: Annual dental screenings help detect early signs of oral cancer, gum disease, and other conditions linked to overall health, including heart disease and diabetes.  Please see the attached documents for additional preventive care recommendations.   NEXT AWV 12/06/24 @ 4:00 PM BY PHONE

## 2023-11-28 ENCOUNTER — Telehealth: Payer: Self-pay

## 2023-11-28 NOTE — Progress Notes (Signed)
 Complex Care Management Note  Care Guide Note 11/28/2023 Name: [name] MRN: [id number] DOB: Jul 18, 1950  Janet [name] is a 73 y.o. year old female who sees Edman Marsa PARAS, DO for primary care. I reached out to [name] by phone today to offer complex care management services.  [name] was given information about Complex Care Management services today including:   The Complex Care Management services include support from the care team which includes your Nurse Care Manager, Clinical Social Worker, or Pharmacist.  The Complex Care Management team is here to help remove barriers to the health concerns and goals most important to you. Complex Care Management services are voluntary, and the patient may decline or stop services at any time by request to their care team member.   Complex Care Management Consent Status: Patient agreed to services and verbal consent obtained.   Follow up plan:  Telephone appointment with complex care management team member scheduled for:  BSW 11/30/2023 RNCM 12/13/2023  Encounter Outcome:  Patient Scheduled  Jeoffrey Buffalo , RMA     [place]  [facility], [facility] Guide  Direct Dial: [phone]  Website: [email or web address]

## 2023-11-30 ENCOUNTER — Other Ambulatory Visit: Payer: Self-pay

## 2023-11-30 NOTE — Patient Outreach (Signed)
 Complex Care Management   Visit Note  11/30/2023  Name:  Janet [name] MRN: [id number] DOB: Oct 17, 1950  Situation: Referral received for Complex Care Management related to SDOH Barriers:  Food insecurity Lack of essential utilities all Financial Resource Strain I obtained verbal consent from Patient.  Visit completed with Patient  on the phone  Background:   Past Medical History:  Diagnosis Date   Anxiety    a.) on BZO (clonazepam ) PRN   Atherosclerosis of left carotid artery    Cardiac pacemaker in situ    Cervical myelopathy (HCC)    Cervical spinal stenosis    Congenital kyphosis, cervical region    Depression    Diastolic dysfunction    a.)  TTE 05/2021: EF 90%, mild LVH, trivial MR/TR/PR, G1DD   Esophageal reflux    Esophageal stricture    a.) s/p multiple (2012, 2015, 2017, 2020) dilitation procedures   Fibromyositis    GERD (gastroesophageal reflux disease)    Heart murmur    History of deep vein thrombosis    Hyperlipidemia    Hypertension    Insomnia    Lymphedema    Major depression, recurrent, full remission    Osteoarthritis    Osteoporosis    Other secondary kyphosis, cervical region    PAD (peripheral artery disease)    Peripheral nerve disease    PONV (postoperative nausea and vomiting)    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 device   Restless legs syndrome    RLS (restless legs syndrome)    a.) takes pramipexole    Sleep apnea    SOB (shortness of breath)    Spinal stenosis of lumbar region    Spondylolisthesis of lumbar region    SUI (stress urinary incontinence, female)    T2DM (type 2 diabetes mellitus) (HCC)    Urinary incontinence    Venous insufficiency of both lower extremities     Assessment: SW completed a telephone outreach with patient, she receives social sercuirty and living with her daguhter that pays the mortgage. She gives her daughter about 250 each month for all bills. Once she pays her other bills she  does not have anything left over. Patient does receieve foodstamps but they do not last the entire month. SW and patient agreed for resources to be emailed to the address on file for food and utiltites.  SDOH Interventions    Flowsheet Row Patient Outreach Telephone from 11/30/2023 in [facility] Clinical Support from 11/24/2023 in [facility] Office Visit from 10/04/2023 in [facility] Clinical Support from 11/18/2022 in [facility] Telephone from 01/31/2022 in [facility] Coordination Clinical Support from 11/08/2021 in [facility]  SDOH Interventions        Food Insecurity Interventions -- AMB Referral -- Intervention Not Indicated Intervention Not Indicated Intervention Not Indicated  Housing Interventions -- Intervention Not Indicated -- Intervention Not Indicated Intervention Not Indicated Intervention Not Indicated  Transportation Interventions -- Intervention Not Indicated -- Intervention Not Indicated Intervention Not Indicated Intervention Not Indicated  Utilities Interventions Community Resources Provided Intervention Not Indicated -- Intervention Not Indicated -- Intervention Not Indicated  Alcohol Usage Interventions -- Intervention Not Indicated (Score <7) -- Intervention Not Indicated (Score <7) -- Intervention Not Indicated (Score <7)  Depression Interventions/Treatment  -- EYV7-0 Score <4 Follow-up Not Indicated Counseling, Medication PHQ2-9 Score <4 Follow-up Not Indicated -- --  Financial Strain Interventions [employer] Resources Provided Intervention Not Indicated, Statistician Referral -- Intervention Not Indicated -- Intervention Not Indicated  Physical Activity Interventions -- Intervention Not Indicated, Patient Declined -- Patient Declined -- Intervention Not Indicated  Stress Interventions --  Intervention Not Indicated -- Intervention Not Indicated -- Intervention Not Indicated  Social Connections Interventions -- Intervention Not Indicated -- Intervention Not Indicated -- Intervention Not Indicated  Health Literacy Interventions -- Intervention Not Indicated -- Intervention Not Indicated -- --    Recommendation:   No recommendations at this time  Follow Up Plan:   Telephone follow-up 12/21/23 at 10am  Thersia Hoar, BSW, MHA [place]  [facility] Social Worker, Population Health [phone]

## 2023-11-30 NOTE — Patient Instructions (Signed)
 Visit Information  Thank you for taking time to visit with me today. Please don't hesitate to contact me if I can be of assistance to you before our next scheduled appointment.  Our next appointment is by telephone on 12/21/23 at 10am Please call the care guide team at [phone] if you need to cancel or reschedule your appointment.   Following is a copy of your care plan:   Goals Addressed             This Visit's Progress    BSW [facility] Social Work Care Plan       Problems:   Financial Strain   CSW Clinical Goal(s):   Over the next 30 days the Patient will work with Child psychotherapist to address concerns related to financial strain.  Interventions:  SW will email resources for utilities and food to email address on file  Patient Goals/Self-Care Activities:  Coordinate with resources SW sent  to assist with food and utilities.  Plan:   The care management team will reach out to the patient again over the next 15 days.        Please call the Suicide and Crisis Lifeline: 988 call the [place]  National Suicide Prevention Lifeline: (236) 375-2889 or TTY: 612-046-1786 TTY 272-412-5805) to talk to a trained counselor call 1-800-273-TALK (toll free, 24 hour hotline) call 911 if you are experiencing a Mental Health or Behavioral Health Crisis or need someone to talk to.  Patient verbalizes understanding of instructions and care plan provided today and agrees to view in MyChart. Active MyChart status and patient understanding of how to access instructions and care plan via MyChart confirmed with patient.     Thersia Hoar, HEDWIG, MHA [place]  [facility] Social Worker, Population Health [phone]

## 2023-12-04 DIAGNOSIS — B356 Tinea cruris: Secondary | ICD-10-CM | POA: Diagnosis not present

## 2023-12-06 ENCOUNTER — Ambulatory Visit: Admitting: Pain Medicine

## 2023-12-06 ENCOUNTER — Encounter: Payer: Self-pay | Admitting: Oncology

## 2023-12-12 DIAGNOSIS — G629 Polyneuropathy, unspecified: Secondary | ICD-10-CM | POA: Diagnosis not present

## 2023-12-12 DIAGNOSIS — G479 Sleep disorder, unspecified: Secondary | ICD-10-CM | POA: Diagnosis not present

## 2023-12-12 DIAGNOSIS — G2581 Restless legs syndrome: Secondary | ICD-10-CM | POA: Diagnosis not present

## 2023-12-12 DIAGNOSIS — R200 Anesthesia of skin: Secondary | ICD-10-CM | POA: Diagnosis not present

## 2023-12-12 DIAGNOSIS — M79604 Pain in right leg: Secondary | ICD-10-CM | POA: Diagnosis not present

## 2023-12-12 DIAGNOSIS — G5603 Carpal tunnel syndrome, bilateral upper limbs: Secondary | ICD-10-CM | POA: Diagnosis not present

## 2023-12-12 DIAGNOSIS — R202 Paresthesia of skin: Secondary | ICD-10-CM | POA: Diagnosis not present

## 2023-12-12 DIAGNOSIS — G5601 Carpal tunnel syndrome, right upper limb: Secondary | ICD-10-CM | POA: Diagnosis not present

## 2023-12-13 ENCOUNTER — Telehealth

## 2023-12-18 NOTE — Progress Notes (Unsigned)
 PROVIDER NOTE: Interpretation of information contained herein should be left to medically-trained personnel. Specific patient instructions are provided elsewhere under Patient Instructions section of medical record. This document was created in part using AI and STT-dictation technology, any transcriptional errors that may result from this process are unintentional.  Patient: Janet [name]  Service: E/M Encounter  Provider: Eric DELENA Como, MD  DOB: 1950-11-13  Delivery: Face-to-face  Specialty: Interventional Pain Management  MRN: [id number]  Setting: Ambulatory outpatient facility  Specialty designation: 09  Type: New Patient  Location: Outpatient office facility  PCP: Edman Marsa PARAS, DO  DOS: 12/20/2023    Referring Prov.: Edman, Alexander *   Primary Reason(s) for Visit: Encounter for initial evaluation of one or more chronic problems (new to examiner) potentially causing chronic pain, and posing a threat to normal musculoskeletal function. (Level of risk: High) CC: No chief complaint on file.  HPI  Janet [name] is a 73 y.o. year old, female patient, who comes for the first time to our practice referred by Edman Marsa * for our initial evaluation of her chronic pain. She has Type 2 diabetes mellitus with other specified complication (HCC); Restless leg syndrome; Major depression, recurrent, full remission; Other insomnia; Symptomatic varicose veins, bilateral; Edema of left lower extremity; Primary osteoarthritis involving multiple joints; Venous insufficiency of both lower extremities; History of bariatric surgery; Cardiac pacemaker; Anxiety disorder; Osteoarthritis; Edema; Fibromyositis; Gastroesophageal reflux disease; Heart murmur; History of pacemaker; Lymphedema; Hyperlipidemia associated with type 2 diabetes mellitus (HCC); Morbid obesity (HCC); Obstructive sleep apnea syndrome; Osteoporosis; Palpitations; Peripheral nerve disease; Primary osteoarthritis of left knee;  Shortness of breath; Urinary incontinence; Postmenopausal bleeding; History of deep vein thrombosis; History of adenomatous polyp of colon; Chronic bilateral low back pain with bilateral sciatica; Atherosclerosis of left carotid artery; Cervical myelopathy (HCC); Spinal stenosis in cervical region; Other secondary kyphosis, cervical region; Spondylolisthesis of lumbar region; Drug-induced myopathy; Lymphadenopathy; Skin candidiasis; Iron deficiency anemia following bariatric surgery; Monoclonal B-cell lymphocytosis of undetermined significance; and 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 Today she comes in for evaluation of her No chief complaint on file.  Pain Assessment: Location:     Radiating:   Onset:   Duration:   Quality:   Severity:  /10 (subjective, self-reported pain score)  Effect on ADL:   Timing:   Modifying factors:   BP:    HR:    Onset and Duration: {Hx; Onset and Duration:210120511} Cause of pain: {Hx; Cause:210120521} Severity: {Pain Severity:210120502} Timing: {Symptoms; Timing:210120501} Aggravating Factors: {Causes; Aggravating pain factors:210120507} Alleviating Factors: {Causes; Alleviating Factors:210120500} Associated Problems: {Hx; Associated problems:210120515} Quality of Pain: {Hx; Symptom quality or Descriptor:210120531} Previous Examinations or Tests: {Hx; Previous examinations or test:210120529} Previous Treatments: {Hx; Previous Treatment:210120503}  Janet [name] is being evaluated for possible interventional pain management therapies for the treatment of her chronic pain.  Discussed the use of AI scribe software for clinical note transcription with the patient, who gave verbal consent to proceed.  History of Present Illness            Janet [name] has been informed that this initial visit was an evaluation only.  On the follow up appointment I will go over the results, including ordered tests and available  interventional therapies. At that time she will have the opportunity to decide whether to proceed with offered therapies or not. In the event that Janet [name] prefers avoiding interventional options, this will conclude our involvement in the case.  Medication management recommendations may be provided  upon request.  Patient informed that diagnostic tests may be ordered to assist in identifying underlying causes, narrow the list of differential diagnoses and aid in determining candidacy for (or contraindications to) planned therapeutic interventions.  Historic Controlled Substance Pharmacotherapy Review PMP and historical list of controlled substances: ***  Most recently prescribed controlled substance(s): Opioid Analgesic: *** MME/day: *** mg/day  Historical Monitoring: The patient  reports no history of drug use. List of prior UDS Testing: No results found for: MDMA, COCAINSCRNUR, PCPSCRNUR, PCPQUANT, CANNABQUANT, THCU, ETH, CBDTHCR, D8THCCBX, D9THCCBX Historical Background Evaluation: [place] PMP: PDMP reviewed during this encounter. Review of the past 96-months conducted.             PMP NARX Score Report:  Narcotic: *** Sedative: *** Stimulant: ***  Department of public safety, offender search: Engineer, mining Information) Non-contributory Risk Assessment Profile: Aberrant behavior: None observed or detected today Risk factors for fatal opioid overdose: None identified today PMP NARX Overdose Risk Score: *** Fatal overdose hazard ratio (HR): Calculation deferred Non-fatal overdose hazard ratio (HR): Calculation deferred Risk of opioid abuse or dependence: 0.7-3.0% with doses <= 36 MME/day and 6.1-26% with doses >= 120 MME/day. Substance use disorder (SUD) risk level: See below Personal History of Substance Abuse (SUD-Substance use disorder):  Alcohol:    Illegal Drugs:    Rx Drugs:    ORT Risk Level calculation:    ORT Scoring interpretation table:  Score <3 = Low Risk  for SUD  Score between 4-7 = Moderate Risk for SUD  Score >8 = High Risk for Opioid Abuse   PHQ-2 Depression Scale:  Total score:    PHQ-2 Scoring interpretation table: (Score and probability of major depressive disorder)  Score 0 = No depression  Score 1 = 15.4% Probability  Score 2 = 21.1% Probability  Score 3 = 38.4% Probability  Score 4 = 45.5% Probability  Score 5 = 56.4% Probability  Score 6 = 78.6% Probability   PHQ-9 Depression Scale:  Total score:    PHQ-9 Scoring interpretation table:  Score 0-4 = No depression  Score 5-9 = Mild depression  Score 10-14 = Moderate depression  Score 15-19 = Moderately severe depression  Score 20-27 = Severe depression (2.4 times higher risk of SUD and 2.89 times higher risk of overuse)   Pharmacologic Plan: As per protocol, I have not taken over any controlled substance management, pending the results of ordered tests and/or consults.            Initial impression: Pending review of available data and ordered tests.  Meds   Current Outpatient Medications:    aspirin  81 MG EC tablet, Take 81 mg by mouth daily., Disp: , Rfl:    Cyanocobalamin  (VITAMIN B 12 PO), Take by mouth., Disp: , Rfl:    Iron Combinations (IRON COMPLEX PO), Take by mouth., Disp: , Rfl:    metoprolol  tartrate (LOPRESSOR ) 25 MG tablet, TAKE 1 TABLET BY MOUTH TWICE  DAILY, Disp: 200 tablet, Rfl: 1   nystatin  (MYCOSTATIN /NYSTOP ) powder, Apply 1 Application topically 3 (three) times daily., Disp: 30 g, Rfl: 2   pantoprazole  (PROTONIX ) 40 MG tablet, TAKE 1 TABLET (40 MG TOTAL) BY MOUTH TWICE A DAY BEFORE MEALS (Patient not taking: Reported on 11/24/2023), Disp: 180 tablet, Rfl: 0   pramipexole  (MIRAPEX ) 1 MG tablet, Take 1 tablet (1 mg total) by mouth 2 (two) times daily., Disp: 180 tablet, Rfl: 3   pregabalin  (LYRICA ) 100 MG capsule, Take 1 capsule (100 mg total) by mouth 3 (three)  times daily., Disp: 90 capsule, Rfl: 2   sucralfate  (CARAFATE ) 1 g tablet, Take 1 tablet (1 g  total) by mouth 4 (four) times daily -  with meals and at bedtime. (Patient not taking: Reported on 11/24/2023), Disp: 90 tablet, Rfl: 1   traMADol  (ULTRAM ) 50 MG tablet, Take 1 tablet (50 mg total) by mouth every 8 (eight) hours as needed., Disp: 60 tablet, Rfl: 2   valsartan  (DIOVAN ) 80 MG tablet, Take 1 tablet (80 mg total) by mouth daily., Disp: 90 tablet, Rfl: 3   VITAMIN D, CHOLECALCIFEROL, PO, Take by mouth., Disp: , Rfl:   Imaging Review  Cervical Imaging: Cervical MR wo contrast: Results for orders placed during the hospital encounter of 09/30/21 MR CERVICAL SPINE WO CONTRAST  Narrative CLINICAL DATA:  Bilateral arm pain.  Neck pain.  EXAM: MRI CERVICAL SPINE WITHOUT CONTRAST  TECHNIQUE: Multiplanar, multisequence MR imaging of the cervical spine was performed. No intravenous contrast was administered.  COMPARISON:  None Available.  FINDINGS: Alignment: Reversal of the normal cervical lordosis. Slight anterolisthesis C3 on C4.  Vertebrae: Vertebral body heights are maintained. Mild multilevel degenerative/discogenic endplate signal changes. Otherwise, no focal marrow edema to suggest acute fracture discitis/osteomyelitis.  Cord: Normal cord signal.  Posterior Fossa, vertebral arteries, paraspinal tissues: Visualized vertebral artery flow voids are maintained. Unremarkable posterior fossa.  Disc levels:  C2-C3: Right greater than left facet and uncovertebral hypertrophy with mild right foraminal stenosis. Patent canal and left foramen.  C3-C4: Small central disc protrusion. Bilateral facet uncovertebral hypertrophy. Resulting mild bilateral foraminal stenosis and mild canal stenosis.  C4-C5: Posterior disc osteophyte complex with bilateral facet and uncovertebral hypertrophy. Resulting moderate to severe canal stenosis and severe bilateral foraminal stenosis.  C5-C6: Posterior disc osteophyte complex with bilateral facet and uncovertebral hypertrophy.  Resulting moderate canal stenosis and moderate bilateral foraminal stenosis.  C6-C7: Posterior disc osteophyte complex with left greater than right facet and uncovertebral hypertrophy. Resulting severe left and moderate right foraminal stenosis with mild canal stenosis.  C7-T1: Bilateral facet arthropathy. No significant canal or foraminal stenosis.  IMPRESSION: 1. At C4-C5, moderate to severe canal stenosis and severe bilateral foraminal stenosis. 2. At C5-C6, moderate canal stenosis and moderate bilateral foraminal stenosis. 3. At C6-C7, severe left and moderate right foraminal stenosis with mild canal stenosis.   Electronically Signed By: Gilmore GORMAN Molt M.D. On: 09/30/2021 15:14  Cervical DG 2-3 views: Results for orders placed during the hospital encounter of 10/25/22 DG Cervical Spine 2 or 3 views  Narrative CLINICAL DATA:  Postop fusion  EXAM: CERVICAL SPINE - 2 VIEW  COMPARISON:  04/26/2022  FINDINGS: No acute fracture. Prevertebral and cervicocranial soft tissues are unremarkable. No spondylolisthesis. Patient is status post ACDF C4-C7.  IMPRESSION: Postop ACDF C4-C7.  No acute osseous abnormalities.   Electronically Signed By: Fonda Field M.D. On: 11/02/2022 10:06  Lumbosacral Imaging: Lumbar MR wo contrast: Results for orders placed during the hospital encounter of 09/30/21 MR LUMBAR SPINE WO CONTRAST  Narrative CLINICAL DATA:  Low back pain.  EXAM: MRI LUMBAR SPINE WITHOUT CONTRAST  TECHNIQUE: Multiplanar, multisequence MR imaging of the lumbar spine was performed. No intravenous contrast was administered.  COMPARISON:  MRI Jun 28, 2020.  FINDINGS: Segmentation:  Standard.  Same numbering.  Alignment:  Grade 1 anterolisthesis of L3 on L4 and L4 on L5.  Vertebrae: Mild edema in the L4 pedicles, typically related to stress. Otherwise, no focal marrow edema chest cute fracture discitis/osteomyelitis. No suspicious bone  lesions.  Conus medullaris  and cauda equina: Conus extends to the T12-L1 level. Conus appears normal.  Paraspinal and other soft tissues: Unremarkable.  Disc levels:  T12-L1: No significant disc protrusion, foraminal stenosis, or canal stenosis.  L1-L2: Slight disc bulge without significant stenosis.  L2-L3: Slight disc bulging without significant stenosis.  L3-L4: Grade 1 anterolisthesis. Uncovering the disc and superimposed disc bulging. Ligamentum flavum thickening and facet arthropathy. Slight progression of severe canal stenosis. Mild bilateral foraminal stenosis.  L4-L5: Grade 1 anterolisthesis. Uncovering the disc with superimposed disc bulging. Ligamentum flavum thickening and facet arthropathy. Slight progression of severe canal stenosis. Mild bilateral foraminal stenosis.  L5-S1: Facet arthropathy without significant stenosis.  IMPRESSION: Slight progression of severe canal stenosis at L3-L4 and L4-L5.   Electronically Signed By: Gilmore GORMAN Molt M.D. On: 09/30/2021 15:18  Lumbar DG 2-3 views: Results for orders placed during the hospital encounter of 10/25/22 DG Lumbar Spine 2-3 Views  Narrative CLINICAL DATA:  Postop fusion  EXAM: LUMBAR SPINE - 2 VIEW  COMPARISON:  04/26/2022  FINDINGS: No compression deformities. Grade 1 L5 retrolisthesis with respect to L4. No osteolytic or osteoblastic changes. Status post posterior fusion and discectomy L3-L5.  IMPRESSION: Status post posterior fusion and discectomies L3-L5. No acute osseous abnormalities.   Electronically Signed By: Fonda Field M.D. On: 11/02/2022 10:11  Lumbar DG (Complete) 4+V: Results for orders placed during the hospital encounter of 12/14/21 DG Lumbar Spine Complete  Narrative CLINICAL DATA:  Back pain and leg pain with no severe degenerative spinal stenosis L3-4 and L4-5.  EXAM: LUMBAR SPINE - COMPLETE 4+ VIEW  COMPARISON:  MRI lumbar spine  09/30/2021  FINDINGS: Unchanged grade 1 degenerative anterolisthesis and moderate disc space loss at L3-4 and L4-5.  No evidence of instability on flexion and extension. The L3-4 anterolisthesis measures 6 mm and the L4-5 anterolisthesis measures 9 mm in all 3 positions.  The alignment is otherwise normal. There is osteopenia. No evidence of fractures with otherwise normal disc heights.  There is mild spondylosis. Advanced facet hypertrophy is again shown at L3-4 and L4-5, moderate facet hypertrophy at L2-3 and L5-S1.  The SI joints are unremarkable, as visualized. There is scattered calcific plaque in the abdominal aorta.  Pacemaker wiring is partially visible in the heart. There are surgical changes in the medial left upper abdomen. Mild hip DJD.  IMPRESSION: 1. Grade 1 degenerative anterolisthesis at L3-4 and L4-5 with no evidence of instability in flexion and extension. 2. Osteopenia and degenerative change. 3. Aortic atherosclerosis.   Electronically Signed By: Francis Quam M.D. On: 12/15/2021 21:24  Complexity Note: Imaging results reviewed.                         ROS  Cardiovascular: {Hx; Cardiovascular History:210120525} Pulmonary or Respiratory: {Hx; Pumonary and/or Respiratory History:210120523} Neurological: {Hx; Neurological:210120504} Psychological-Psychiatric: {Hx; Psychological-Psychiatric History:210120512} Gastrointestinal: {Hx; Gastrointestinal:210120527} Genitourinary: {Hx; Genitourinary:210120506} Hematological: {Hx; Hematological:210120510} Endocrine: {Hx; Endocrine history:210120509} Rheumatologic: {Hx; Rheumatological:210120530} Musculoskeletal: {Hx; Musculoskeletal:210120528} Work History: {Hx; Work history:210120514}  Allergies  Janet [name] is allergic to celebrex  [celecoxib ].  Laboratory Chemistry Profile   Renal Lab Results  Component Value Date   BUN 23 05/08/2023   CREATININE 0.71 05/08/2023   LABCREA 32 09/10/2021   BCR SEE  NOTE: 01/20/2023   GFRAA 107 09/12/2019   GFRNONAA >60 05/08/2023   PROTEINUR NEGATIVE 12/24/2021     Electrolytes Lab Results  Component Value Date   NA 138 05/08/2023   K 3.8 05/08/2023   CL 103 05/08/2023  CALCIUM  8.8 (L) 05/08/2023     Hepatic Lab Results  Component Value Date   AST 20 05/08/2023   ALT 19 05/08/2023   ALBUMIN 4.1 05/08/2023   ALKPHOS 90 05/08/2023   LIPASE 33 01/20/2023     ID Lab Results  Component Value Date   HIV Non Reactive 05/08/2023   SARSCOV2NAA Not Detected 03/08/2020   STAPHAUREUS NEGATIVE 12/24/2021   MRSAPCR NEGATIVE 12/24/2021   HCVAB NON REACTIVE 05/08/2023     Bone No results found for: VD25OH, CI874NY7UNU, CI6874NY7, CI7874NY7, 25OHVITD1, 25OHVITD2, 25OHVITD3, TESTOFREE, TESTOSTERONE   Endocrine Lab Results  Component Value Date   GLUCOSE 101 (H) 05/08/2023   GLUCOSEU NEGATIVE 12/24/2021   HGBA1C 5.6 03/07/2023   TSH 1.53 12/02/2022     Neuropathy Lab Results  Component Value Date   VITAMINB12 262 12/02/2022   HGBA1C 5.6 03/07/2023   HIV Non Reactive 05/08/2023     CNS No results found for: COLORCSF, APPEARCSF, RBCCOUNTCSF, WBCCSF, POLYSCSF, LYMPHSCSF, EOSCSF, PROTEINCSF, GLUCCSF, JCVIRUS, CSFOLI, IGGCSF, LABACHR, ACETBL   Inflammation (CRP: Acute  ESR: Chronic) No results found for: CRP, ESRSEDRATE, LATICACIDVEN   Rheumatology No results found for: RF, ANA, LABURIC, URICUR, LYMEIGGIGMAB, LYMEABIGMQN, HLAB27   Coagulation Lab Results  Component Value Date   INR 0.9 02/26/2021   LABPROT 12.6 02/26/2021   PLT 259 09/18/2023     Cardiovascular Lab Results  Component Value Date   BNP 26.7 07/08/2022   HGB 12.3 09/18/2023   HCT 39.5 09/18/2023     Screening Lab Results  Component Value Date   SARSCOV2NAA Not Detected 03/08/2020   STAPHAUREUS NEGATIVE 12/24/2021   MRSAPCR NEGATIVE 12/24/2021   HCVAB NON REACTIVE 05/08/2023   HIV Non  Reactive 05/08/2023     Cancer No results found for: CEA, CA125, LABCA2   Allergens No results found for: ALMOND, APPLE, ASPARAGUS, AVOCADO, BANANA, BARLEY, BASIL, BAYLEAF, GREENBEAN, LIMABEAN, WHITEBEAN, BEEFIGE, REDBEET, BLUEBERRY, BROCCOLI, CABBAGE, MELON, CARROT, CASEIN, CASHEWNUT, CAULIFLOWER, CELERY     Note: Lab results reviewed.  PFSH  Drug: Janet [name]  reports no history of drug use. Alcohol:  reports no history of alcohol use. Tobacco:  reports that she quit smoking about 18 years ago. Her smoking use included cigarettes. She started smoking about 43 years ago. She has a 25 pack-year smoking history. She has never been exposed to tobacco smoke. She has never used smokeless tobacco. Medical:  has a past medical history of Anxiety, Atherosclerosis of left carotid artery, Cardiac pacemaker in situ, Cervical myelopathy (HCC), Cervical spinal stenosis, Congenital kyphosis, cervical region, Depression, Diastolic dysfunction, Esophageal reflux, Esophageal stricture, Fibromyositis, GERD (gastroesophageal reflux disease), Heart murmur, History of deep vein thrombosis, Hyperlipidemia, Hypertension, Insomnia, Lymphedema, Major depression, recurrent, full remission, Osteoarthritis, Osteoporosis, Other secondary kyphosis, cervical region, PAD (peripheral artery disease), Peripheral nerve disease, PONV (postoperative nausea and vomiting), 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 Family: family history includes Brain cancer in her maternal grandmother; Heart attack (age of onset: 24) in her mother; Heart disease in her father; Skin  cancer in her father; Thyroid  disease in her father.  Past Surgical History:  Procedure Laterality Date   ABDOMINAL HYSTERECTOMY     ANTERIOR CERVICAL DECOMP/DISCECTOMY FUSION N/A 11/03/2021   Procedure: C4-7 ANTERIOR CERVICAL DISCECTOMY AND FUSION (GLOBUS  FORGE);  Surgeon: Clois Fret, MD;  Location: [facility];  Service: Neurosurgery;  Laterality: N/A;   ANTERIOR LATERAL LUMBAR FUSION WITH PERCUTANEOUS SCREW 2 LEVEL N/A 01/26/2022   Procedure: L3-5 LATERAL LUMBAR INTERBODY FUSION WITH POSTERIOR SPINAL FUSION;  Surgeon: Clois Fret, MD;  Location: [facility];  Service: Neurosurgery;  Laterality: N/A;   APPENDECTOMY  2014   APPLICATION OF INTRAOPERATIVE CT SCAN N/A 01/26/2022   Procedure: APPLICATION OF INTRAOPERATIVE CT SCAN;  Surgeon: Clois Fret, MD;  Location: [facility];  Service: Neurosurgery;  Laterality: N/A;   BREAST SURGERY     CARPAL TUNNEL RELEASE Left 1998   COLONOSCOPY N/A 07/28/2023   Procedure: COLONOSCOPY;  Surgeon: Maryruth Ole DASEN, MD;  Location: [facility];  Service: Endoscopy;  Laterality: N/A;  NO INTERPRETER NEEDED   ESOPHAGEAL DILATION  2012   2015, 2017, 2020   ESOPHAGOGASTRODUODENOSCOPY N/A 07/28/2023   Procedure: EGD (ESOPHAGOGASTRODUODENOSCOPY);  Surgeon: Maryruth Ole DASEN, MD;  Location: [facility];  Service: Endoscopy;  Laterality: N/A;   GASTRIC BYPASS  2019   HERNIA REPAIR  2019   JOINT REPLACEMENT     left knee manipulation Left 02/05/2019   PACEMAKER IMPLANT  05/2015   Procedure: PACEMAKER INSERTION; Location: [place], [place]; Surgeon: Moody, MD   POLYPECTOMY     SHOULDER SURGERY Left 2014   SHOULDER SURGERY Right 2017   TOTAL KNEE ARTHROPLASTY Right 2018   TOTAL KNEE ARTHROPLASTY Left 10/18/2018   vein removal Bilateral 2012   Active Ambulatory Problems    Diagnosis Date Noted   Type 2 diabetes mellitus with other specified complication (HCC) 08/20/2019   Restless leg syndrome 08/20/2019   Major depression, recurrent,  full remission 08/20/2019   Other insomnia 08/20/2019   Symptomatic varicose veins, bilateral 08/20/2019   Edema of left lower extremity 08/20/2019   Primary osteoarthritis involving multiple joints 08/20/2019   Venous insufficiency of both lower extremities 08/20/2019   History of bariatric surgery 08/20/2019   Cardiac pacemaker 08/20/2019   Anxiety disorder 10/31/2018   Osteoarthritis 07/31/2014   Edema 10/31/2018   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   Gastroesophageal reflux disease 07/31/2014   Heart murmur 10/31/2018   History of pacemaker 09/12/2018   Lymphedema 12/16/2014   Hyperlipidemia associated with type 2 diabetes mellitus (HCC) 07/31/2014   Morbid obesity (HCC) 07/31/2014   Obstructive sleep apnea syndrome 07/31/2014   Osteoporosis 10/31/2018   Palpitations 06/20/2016   Peripheral nerve disease 07/31/2014   Primary osteoarthritis of left knee 10/19/2018   Shortness of breath 09/12/2018   Urinary incontinence 07/31/2014   Postmenopausal bleeding 08/04/2015   History of deep vein thrombosis 09/16/2019   History of adenomatous polyp of colon 02/28/2014   Chronic bilateral low back pain with bilateral sciatica 04/14/2020   Atherosclerosis of left carotid artery 07/20/2021   Cervical myelopathy (HCC)    Spinal stenosis in cervical region    Other secondary kyphosis, cervical region    Spondylolisthesis of lumbar region 01/26/2022   Drug-induced myopathy 12/02/2022   Lymphadenopathy 05/08/2023   Skin candidiasis 05/08/2023   Iron deficiency anemia following bariatric surgery 05/08/2023   Monoclonal B-cell lymphocytosis of undetermined significance 05/23/2023   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) 01/13/2015   Resolved Ambulatory Problems    Diagnosis Date Noted   No Resolved Ambulatory Problems   Past Medical History:  Diagnosis Date   Anxiety    Cardiac pacemaker in situ    Cervical spinal stenosis    Congenital kyphosis, cervical region     Depression  Diastolic dysfunction    Esophageal reflux    Esophageal stricture    GERD (gastroesophageal reflux disease)    Hyperlipidemia    Hypertension    Insomnia    PAD (peripheral artery disease)    PONV (postoperative nausea and vomiting)    Presence of permanent cardiac pacemaker 05/2015   Restless legs syndrome    RLS (restless legs syndrome)    Sleep apnea    SOB (shortness of breath)    Spinal stenosis of lumbar region    SUI (stress urinary incontinence, female)    T2DM (type 2 diabetes mellitus) (HCC)    Constitutional Exam  General appearance: Well nourished, well developed, and well hydrated. In no apparent acute distress There were no vitals filed for this visit. BMI Assessment: Estimated body mass index is 45.15 kg/m as calculated from the following:   Height as of 10/04/23: 5' 0.3 (1.532 m).   Weight as of 10/04/23: 233 lb 8 oz (105.9 kg).  BMI interpretation table: BMI level Category Range association with higher incidence of chronic pain  <18 kg/m2 Underweight   18.5-24.9 kg/m2 Ideal body weight   25-29.9 kg/m2 Overweight Increased incidence by 20%  30-34.9 kg/m2 Obese (Class I) Increased incidence by 68%  35-39.9 kg/m2 Severe obesity (Class II) Increased incidence by 136%  >40 kg/m2 Extreme obesity (Class III) Increased incidence by 254%   Patient's current BMI Ideal Body weight  There is no height or weight on file to calculate BMI. Patient weight not recorded   BMI Readings from Last 4 Encounters:  10/04/23 45.15 kg/m  09/21/23 45.15 kg/m  07/28/23 44.67 kg/m  05/22/23 44.61 kg/m   Wt Readings from Last 4 Encounters:  10/04/23 233 lb 8 oz (105.9 kg)  09/21/23 233 lb 8 oz (105.9 kg)  07/28/23 231 lb (104.8 kg)  05/22/23 230 lb 11.2 oz (104.6 kg)    Psych/Mental status: Alert, oriented x 3 (person, place, & time)       Eyes: PERLA Respiratory: No evidence of acute respiratory distress  Assessment  Primary Diagnosis & Pertinent Problem  List: There were no encounter diagnoses.  Visit Diagnosis (New problems to examiner): No diagnosis found. Plan of Care (Initial workup plan)  Note: Janet [name] was reminded that as per protocol, today's visit has been an evaluation only. We have not taken over the patient's controlled substance management.  Problem-specific plan: Assessment and Plan            Lab Orders  No laboratory test(s) ordered today   Imaging Orders  No imaging studies ordered today   Referral Orders  No referral(s) requested today   Procedure Orders    No procedure(s) ordered today   Pharmacotherapy (current): Medications ordered:  No orders of the defined types were placed in this encounter.  Medications administered during this visit: Arieanna Pressey had no medications administered during this visit.   Analgesic Pharmacotherapy:  Opioid Analgesics: For patients currently taking or requesting to take opioid analgesics, in accordance with [place]  Medical Board Guidelines, we will assess their risks and indications for the use of these substances. After completing our evaluation, we may offer recommendations, but we no longer take patients for medication management. The prescribing physician will ultimately decide, based on his/her training and level of comfort whether to adopt any of the recommendations, including whether or not to prescribe such medicines.  Membrane stabilizer: To be determined at a later time  Muscle relaxant: To be determined at a later time  NSAID: To be determined at a later time  Other analgesic(s): To be determined at a later time   Interventional management options: Janet [name] was informed that there is no guarantee that she would be a candidate for interventional therapies. The decision will be based on the results of diagnostic studies, as well as Janet [name]'s risk profile.  Procedure(s) under consideration:  Pending results of ordered studies     Interventional  Therapies  Risk Factors  Considerations  Medical Comorbidities:     Planned  Pending:      Under consideration:   Pending   Completed: (Analgesic benefit)1  None at this time   Therapeutic  Palliative (PRN) options:   None established   Completed by other providers:   None reported  1(Analgesic benefit): Expressed in percentage (%). (Local anesthetic[LA] +/- sedation  L.A.Local Anesthetic  Steroid benefit  Ongoing benefit)   Provider-requested follow-up: No follow-ups on file.  Future Appointments  Date Time Provider Department Center  12/20/2023 11:00 AM Tanya Glisson, MD [facility] None  12/21/2023 10:00 AM Delene Rouse J [facility] None  12/29/2023  1:00 PM Karoline Lima, RN [facility] None  03/18/2024 10:00 AM [facility] None  03/21/2024  2:45 PM Babara Call, MD [facility] None  03/21/2024  3:00 PM [facility] INFUSION CHAIR 1 [facility] None  12/06/2024  4:00 PM [facility] [address]   I discussed the assessment and treatment plan with the patient. The patient was provided an opportunity to ask questions and all were answered. The patient agreed with the plan and demonstrated an understanding of the instructions.  Patient advised to call back or seek an in-person evaluation if the symptoms or condition worsens.  Duration of encounter: *** minutes.  Total time on encounter, as per AMA guidelines included both the face-to-face and non-face-to-face time personally spent by the physician and/or other qualified health care professional(s) on the day of the encounter (includes time in activities that require the physician or other qualified health care professional and does not include time in activities normally performed by clinical staff). Physician's time may include the following activities when performed: Preparing to see the patient (e.g., pre-charting review of records, searching for previously ordered imaging, lab work, and nerve conduction  tests) Review of prior analgesic pharmacotherapies. Reviewing PMP Interpreting ordered tests (e.g., lab work, imaging, nerve conduction tests) Performing post-procedure evaluations, including interpretation of diagnostic procedures Obtaining and/or reviewing separately obtained history Performing a medically appropriate examination and/or evaluation Counseling and educating the patient/family/caregiver Ordering medications, tests, or procedures Referring and communicating with other health care professionals (when not separately reported) Documenting clinical information in the electronic or other health record Independently interpreting results (not separately reported) and communicating results to the patient/ family/caregiver Care coordination (not separately reported)  Note by: Glisson DELENA Tanya, MD (TTS and AI technology used. I apologize for any typographical errors that were not detected and corrected.) Date: 12/20/2023; Time: 6:03 AM

## 2023-12-18 NOTE — Patient Instructions (Signed)

## 2023-12-19 DIAGNOSIS — Z789 Other specified health status: Secondary | ICD-10-CM | POA: Insufficient documentation

## 2023-12-19 DIAGNOSIS — Z79899 Other long term (current) drug therapy: Secondary | ICD-10-CM | POA: Insufficient documentation

## 2023-12-19 DIAGNOSIS — M899 Disorder of bone, unspecified: Secondary | ICD-10-CM | POA: Insufficient documentation

## 2023-12-19 DIAGNOSIS — G894 Chronic pain syndrome: Secondary | ICD-10-CM | POA: Insufficient documentation

## 2023-12-20 ENCOUNTER — Encounter: Payer: Self-pay | Admitting: Pain Medicine

## 2023-12-20 ENCOUNTER — Ambulatory Visit: Attending: Pain Medicine | Admitting: Pain Medicine

## 2023-12-20 ENCOUNTER — Encounter: Payer: Self-pay | Admitting: Oncology

## 2023-12-20 VITALS — BP 153/74 | HR 79 | Temp 97.9°F | Resp 16 | Ht 61.0 in | Wt 240.0 lb

## 2023-12-20 DIAGNOSIS — M549 Dorsalgia, unspecified: Secondary | ICD-10-CM | POA: Insufficient documentation

## 2023-12-20 DIAGNOSIS — G2581 Restless legs syndrome: Secondary | ICD-10-CM | POA: Insufficient documentation

## 2023-12-20 DIAGNOSIS — Z79899 Other long term (current) drug therapy: Secondary | ICD-10-CM | POA: Insufficient documentation

## 2023-12-20 DIAGNOSIS — M5441 Lumbago with sciatica, right side: Secondary | ICD-10-CM | POA: Diagnosis not present

## 2023-12-20 DIAGNOSIS — M25561 Pain in right knee: Secondary | ICD-10-CM | POA: Diagnosis not present

## 2023-12-20 DIAGNOSIS — Z96653 Presence of artificial knee joint, bilateral: Secondary | ICD-10-CM | POA: Diagnosis not present

## 2023-12-20 DIAGNOSIS — M25562 Pain in left knee: Secondary | ICD-10-CM | POA: Diagnosis not present

## 2023-12-20 DIAGNOSIS — M79601 Pain in right arm: Secondary | ICD-10-CM | POA: Insufficient documentation

## 2023-12-20 DIAGNOSIS — M79605 Pain in left leg: Secondary | ICD-10-CM | POA: Insufficient documentation

## 2023-12-20 DIAGNOSIS — M25511 Pain in right shoulder: Secondary | ICD-10-CM | POA: Diagnosis not present

## 2023-12-20 DIAGNOSIS — M542 Cervicalgia: Secondary | ICD-10-CM | POA: Insufficient documentation

## 2023-12-20 DIAGNOSIS — G8928 Other chronic postprocedural pain: Secondary | ICD-10-CM | POA: Insufficient documentation

## 2023-12-20 DIAGNOSIS — M25551 Pain in right hip: Secondary | ICD-10-CM | POA: Diagnosis not present

## 2023-12-20 DIAGNOSIS — M79602 Pain in left arm: Secondary | ICD-10-CM | POA: Diagnosis not present

## 2023-12-20 DIAGNOSIS — M961 Postlaminectomy syndrome, not elsewhere classified: Secondary | ICD-10-CM | POA: Diagnosis not present

## 2023-12-20 DIAGNOSIS — M79604 Pain in right leg: Secondary | ICD-10-CM | POA: Insufficient documentation

## 2023-12-20 DIAGNOSIS — Z9889 Other specified postprocedural states: Secondary | ICD-10-CM | POA: Diagnosis not present

## 2023-12-20 DIAGNOSIS — G8929 Other chronic pain: Secondary | ICD-10-CM | POA: Diagnosis not present

## 2023-12-20 DIAGNOSIS — M25552 Pain in left hip: Secondary | ICD-10-CM | POA: Insufficient documentation

## 2023-12-20 DIAGNOSIS — R109 Unspecified abdominal pain: Secondary | ICD-10-CM | POA: Diagnosis not present

## 2023-12-20 DIAGNOSIS — R102 Pelvic and perineal pain unspecified side: Secondary | ICD-10-CM | POA: Insufficient documentation

## 2023-12-20 DIAGNOSIS — M5442 Lumbago with sciatica, left side: Secondary | ICD-10-CM | POA: Insufficient documentation

## 2023-12-20 DIAGNOSIS — M899 Disorder of bone, unspecified: Secondary | ICD-10-CM | POA: Insufficient documentation

## 2023-12-20 DIAGNOSIS — M25512 Pain in left shoulder: Secondary | ICD-10-CM | POA: Diagnosis not present

## 2023-12-20 DIAGNOSIS — Z789 Other specified health status: Secondary | ICD-10-CM | POA: Insufficient documentation

## 2023-12-20 DIAGNOSIS — G894 Chronic pain syndrome: Secondary | ICD-10-CM | POA: Diagnosis not present

## 2023-12-20 NOTE — Progress Notes (Signed)
 Safety precautions to be maintained throughout the outpatient stay will include: orient to surroundings, keep bed in low position, maintain call bell within reach at all times, provide assistance with transfer out of bed and ambulation.

## 2023-12-21 ENCOUNTER — Ambulatory Visit
Admission: RE | Admit: 2023-12-21 | Discharge: 2023-12-21 | Disposition: A | Discharge status: Home / Self Care | Source: Ambulatory Visit | Attending: Pain Medicine | Admitting: Pain Medicine

## 2023-12-21 ENCOUNTER — Telehealth

## 2023-12-21 ENCOUNTER — Other Ambulatory Visit: Payer: Self-pay

## 2023-12-21 DIAGNOSIS — M25511 Pain in right shoulder: Secondary | ICD-10-CM | POA: Diagnosis not present

## 2023-12-21 DIAGNOSIS — G8929 Other chronic pain: Secondary | ICD-10-CM | POA: Diagnosis not present

## 2023-12-21 DIAGNOSIS — M19011 Primary osteoarthritis, right shoulder: Secondary | ICD-10-CM | POA: Diagnosis not present

## 2023-12-21 DIAGNOSIS — M25552 Pain in left hip: Secondary | ICD-10-CM | POA: Diagnosis not present

## 2023-12-21 DIAGNOSIS — M79602 Pain in left arm: Secondary | ICD-10-CM | POA: Insufficient documentation

## 2023-12-21 DIAGNOSIS — M79605 Pain in left leg: Secondary | ICD-10-CM | POA: Diagnosis not present

## 2023-12-21 DIAGNOSIS — M25562 Pain in left knee: Secondary | ICD-10-CM | POA: Diagnosis not present

## 2023-12-21 DIAGNOSIS — G8928 Other chronic postprocedural pain: Secondary | ICD-10-CM

## 2023-12-21 DIAGNOSIS — M79601 Pain in right arm: Secondary | ICD-10-CM | POA: Diagnosis not present

## 2023-12-21 DIAGNOSIS — M549 Dorsalgia, unspecified: Secondary | ICD-10-CM | POA: Insufficient documentation

## 2023-12-21 DIAGNOSIS — M961 Postlaminectomy syndrome, not elsewhere classified: Secondary | ICD-10-CM | POA: Insufficient documentation

## 2023-12-21 DIAGNOSIS — M810 Age-related osteoporosis without current pathological fracture: Secondary | ICD-10-CM | POA: Diagnosis not present

## 2023-12-21 DIAGNOSIS — Z9889 Other specified postprocedural states: Secondary | ICD-10-CM | POA: Insufficient documentation

## 2023-12-21 DIAGNOSIS — Z981 Arthrodesis status: Secondary | ICD-10-CM | POA: Diagnosis not present

## 2023-12-21 DIAGNOSIS — M47816 Spondylosis without myelopathy or radiculopathy, lumbar region: Secondary | ICD-10-CM | POA: Diagnosis not present

## 2023-12-21 DIAGNOSIS — M25561 Pain in right knee: Secondary | ICD-10-CM | POA: Insufficient documentation

## 2023-12-21 DIAGNOSIS — Z96652 Presence of left artificial knee joint: Secondary | ICD-10-CM | POA: Diagnosis not present

## 2023-12-21 DIAGNOSIS — M5441 Lumbago with sciatica, right side: Secondary | ICD-10-CM | POA: Insufficient documentation

## 2023-12-21 DIAGNOSIS — M1712 Unilateral primary osteoarthritis, left knee: Secondary | ICD-10-CM | POA: Diagnosis not present

## 2023-12-21 DIAGNOSIS — M5442 Lumbago with sciatica, left side: Secondary | ICD-10-CM | POA: Diagnosis not present

## 2023-12-21 DIAGNOSIS — M542 Cervicalgia: Secondary | ICD-10-CM | POA: Insufficient documentation

## 2023-12-21 DIAGNOSIS — Z96653 Presence of artificial knee joint, bilateral: Secondary | ICD-10-CM | POA: Diagnosis not present

## 2023-12-21 DIAGNOSIS — M4316 Spondylolisthesis, lumbar region: Secondary | ICD-10-CM | POA: Diagnosis not present

## 2023-12-21 DIAGNOSIS — M25551 Pain in right hip: Secondary | ICD-10-CM | POA: Insufficient documentation

## 2023-12-21 DIAGNOSIS — M19012 Primary osteoarthritis, left shoulder: Secondary | ICD-10-CM | POA: Diagnosis not present

## 2023-12-21 DIAGNOSIS — M25512 Pain in left shoulder: Secondary | ICD-10-CM | POA: Diagnosis not present

## 2023-12-21 DIAGNOSIS — M47812 Spondylosis without myelopathy or radiculopathy, cervical region: Secondary | ICD-10-CM | POA: Diagnosis not present

## 2023-12-21 DIAGNOSIS — M79604 Pain in right leg: Secondary | ICD-10-CM | POA: Insufficient documentation

## 2023-12-21 DIAGNOSIS — Z96651 Presence of right artificial knee joint: Secondary | ICD-10-CM | POA: Diagnosis not present

## 2023-12-21 DIAGNOSIS — Z4789 Encounter for other orthopedic aftercare: Secondary | ICD-10-CM | POA: Diagnosis not present

## 2023-12-21 DIAGNOSIS — M4802 Spinal stenosis, cervical region: Secondary | ICD-10-CM | POA: Diagnosis not present

## 2023-12-21 DIAGNOSIS — M1711 Unilateral primary osteoarthritis, right knee: Secondary | ICD-10-CM | POA: Diagnosis not present

## 2023-12-21 NOTE — Patient Outreach (Signed)
 Complex Care Management   Visit Note  12/21/2023  Name:  Janet [name] MRN: [id number] DOB: 1950/03/29  Situation: Referral received for Complex Care Management related to SDOH Barriers:  Housing all Food insecurity Financial Resource Strain I obtained verbal consent from Patient.  Visit completed with Patient  on the phone  Background:   Past Medical History:  Diagnosis Date   Anxiety    a.) on BZO (clonazepam ) PRN   Atherosclerosis of left carotid artery    Cardiac pacemaker in situ    Cervical myelopathy (HCC)    Cervical spinal stenosis    Congenital kyphosis, cervical region    Depression    Diastolic dysfunction    a.)  TTE 05/2021: EF 90%, mild LVH, trivial MR/TR/PR, G1DD   Esophageal reflux    Esophageal stricture    a.) s/p multiple (2012, 2015, 2017, 2020) dilitation procedures   Fibromyositis    GERD (gastroesophageal reflux disease)    Heart murmur    History of deep vein thrombosis    Hyperlipidemia    Hypertension    Insomnia    Lymphedema    Major depression, recurrent, full remission    Osteoarthritis    Osteoporosis    Other secondary kyphosis, cervical region    PAD (peripheral artery disease)    Peripheral nerve disease    PONV (postoperative nausea and vomiting)    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 Jude device   Restless legs syndrome    RLS (restless legs syndrome)    a.) takes pramipexole    Sleep apnea    SOB (shortness of breath)    Spinal stenosis of lumbar region    Spondylolisthesis of lumbar region    SUI (stress urinary incontinence, female)    T2DM (type 2 diabetes mellitus) (HCC)    Urinary incontinence    Venous insufficiency of both lower extremities     Assessment: SW completed a telephone outreach with patient, she states she did receive the resources SW sent, but her daughter deleted the email. SW and patient agreed for resources to be resent via email.  SDOH Interventions    Flowsheet Row Patient  Outreach Telephone from 11/30/2023 in [place] [facility]PART[facility] Clinical Support from 11/24/2023 in [facility] Visit from 10/04/2023 in [facility] [place] [facility] Clinical Support from 11/18/2022 in [place] [place] [facility] Telephone from 01/31/2022 in [facility] Community Care Coordination Clinical Support from 11/08/2021 in [facility]  SDOH Interventions        Food Insecurity Interventions -- AMB Referral -- Intervention Not Indicated Intervention Not Indicated Intervention Not Indicated  Housing Interventions -- Intervention Not Indicated -- Intervention Not Indicated Intervention Not Indicated Intervention Not Indicated  Transportation Interventions -- Intervention Not Indicated -- Intervention Not Indicated Intervention Not Indicated Intervention Not Indicated  Utilities Interventions Community Resources Provided Intervention Not Indicated -- Intervention Not Indicated -- Intervention Not Indicated  Alcohol Usage Interventions -- Intervention Not Indicated (Score <7) -- Intervention Not Indicated (Score <7) -- Intervention Not Indicated (Score <7)  Depression Interventions/Treatment  -- EYV7-0 Score <4 Follow-up Not Indicated Counseling, Medication PHQ2-9 Score <4 Follow-up Not Indicated -- --  Financial Strain Interventions Community Resources Provided Intervention Not Indicated, Statistician Referral -- Intervention Not Indicated -- Intervention Not Indicated  Physical Activity Interventions -- Intervention Not Indicated, Patient Declined -- Patient Declined -- Intervention Not Indicated  Stress Interventions --  Intervention Not Indicated -- Intervention Not Indicated -- Intervention Not Indicated  Social Connections Interventions -- Intervention Not Indicated -- Intervention Not Indicated -- Intervention Not Indicated  Health Literacy Interventions -- Intervention Not  Indicated -- Intervention Not Indicated -- --      Recommendation:   No recommendations at this time  Follow Up Plan:   Telephone follow-up in 1 month  Thersia Hoar, BSW, [facility]   [facility] Social Worker, Population Health [phone]

## 2023-12-21 NOTE — Patient Instructions (Signed)
 Visit Information  Thank you for taking time to visit with me today. Please don't hesitate to contact me if I can be of assistance to you before our next scheduled appointment.  Your next care management appointment is by telephone on 01/18/24 at 11am    Please call the care guide team at [phone] if you need to cancel, schedule, or reschedule an appointment.   Please call the Suicide and Crisis Lifeline: 988 call the [place]  National Suicide Prevention Lifeline: (318)380-7568 or TTY: (603)775-0362 TTY (334) 446-2247) to talk to a trained counselor call 1-800-273-TALK (toll free, 24 hour hotline) call 911 if you are experiencing a Mental Health or Behavioral Health Crisis or need someone to talk to.  Thersia Hoar, HEDWIG, MHA [place]  [facility] Social Worker, Population Health [phone]

## 2023-12-24 LAB — COMPLIANCE DRUG ANALYSIS, UR

## 2023-12-25 DIAGNOSIS — B356 Tinea cruris: Secondary | ICD-10-CM | POA: Diagnosis not present

## 2023-12-25 DIAGNOSIS — I10 Essential (primary) hypertension: Secondary | ICD-10-CM | POA: Diagnosis not present

## 2023-12-25 DIAGNOSIS — R102 Pelvic and perineal pain unspecified side: Secondary | ICD-10-CM | POA: Diagnosis not present

## 2023-12-27 LAB — SEDIMENTATION RATE: Sed Rate: 24 mm/h (ref 0–40)

## 2023-12-27 LAB — COMP. METABOLIC PANEL (12)
AST: 22 IU/L (ref 0–40)
Albumin: 4.3 g/dL (ref 3.9–4.9)
Alkaline Phosphatase: 114 IU/L (ref 49–135)
BUN/Creatinine Ratio: 25 (ref 12–28)
BUN: 20 mg/dL (ref 8–27)
Bilirubin Total: 0.6 mg/dL (ref 0.0–1.2)
Calcium: 9.2 mg/dL (ref 8.7–10.3)
Chloride: 103 mmol/L (ref 96–106)
Creatinine, Ser: 0.79 mg/dL (ref 0.57–1.00)
Globulin, Total: 3 g/dL (ref 1.5–4.5)
Glucose: 110 mg/dL — ABNORMAL HIGH (ref 70–99)
Potassium: 4.7 mmol/L (ref 3.5–5.2)
Sodium: 143 mmol/L (ref 134–144)
Total Protein: 7.3 g/dL (ref 6.0–8.5)
eGFR: 80 mL/min/1.73 (ref >59)

## 2023-12-27 LAB — 25-HYDROXY VITAMIN D LCMS D2+D3
25-Hydroxy, Vitamin D-2: <1 ng/mL
25-Hydroxy, Vitamin D-3: 20 ng/mL
25-Hydroxy, Vitamin D: 20 ng/mL — ABNORMAL LOW

## 2023-12-27 LAB — MAGNESIUM: Magnesium: 2.1 mg/dL (ref 1.6–2.3)

## 2023-12-27 LAB — C-REACTIVE PROTEIN: CRP: 7 mg/L (ref 0–10)

## 2023-12-27 LAB — VITAMIN B12: Vitamin B-12: 685 pg/mL (ref 232–1245)

## 2023-12-29 ENCOUNTER — Telehealth: Payer: Self-pay

## 2023-12-29 ENCOUNTER — Encounter: Payer: Self-pay | Admitting: Oncology

## 2023-12-29 NOTE — Patient Instructions (Signed)
 Janet [name] - I am sorry I was unable to reach you today for our scheduled appointment. I work with Edman, Marsa PARAS, DO and am calling to support your healthcare needs. Please contact me at your earliest convenience. I look forward to speaking with you soon.   Thank you,   Jackson Acron [facility] RN Care Manager Direct Dial: [phone]  Fax: [phone] Website: [email or web address]

## 2024-01-04 ENCOUNTER — Encounter: Payer: Self-pay | Admitting: Internal Medicine

## 2024-01-04 ENCOUNTER — Ambulatory Visit (INDEPENDENT_AMBULATORY_CARE_PROVIDER_SITE_OTHER): Admitting: Internal Medicine

## 2024-01-04 VITALS — BP 130/70 | HR 82 | Ht 61.0 in | Wt 237.0 lb

## 2024-01-04 DIAGNOSIS — J00 Acute nasopharyngitis [common cold]: Secondary | ICD-10-CM | POA: Diagnosis not present

## 2024-01-04 MED ORDER — AMOXICILLIN-POT CLAVULANATE 875-125 MG PO TABS: 1.0000 | ORAL_TABLET | Freq: Two times a day (BID) | ORAL | 0 refills | Status: DC

## 2024-01-04 NOTE — Patient Instructions (Signed)

## 2024-01-04 NOTE — Progress Notes (Signed)
 Subjective:    Patient ID: Janet [name], female    DOB: [birth year], 73 y.o.   MRN: [id number]  HPI  Discussed the use of AI scribe software for clinical note transcription with the patient, who gave verbal consent to proceed.  Janet [name] is a 73 year old female who presents with a two-week history of worsening cough and nasal congestion.  She has been experiencing a headache, runny nose with thick yellow nasal discharge, and a persistent cough for two weeks. The cough occasionally produces sputum, though she is unsure of its color. She also experiences mild shortness of breath.  She had a fever for two days, which has since resolved. No sinus pressure, ear pain, sore throat, nausea, vomiting, diarrhea, chills, or body aches. She does not have a history of allergies, asthma, or COPD.  Her son recently had a cold, but he has since recovered. She has been taking DayQuil for her symptoms. She feels her symptoms are worsening, particularly the hoarseness associated with her cough.    Review of Systems   Past Medical History:  Diagnosis Date   Anxiety    a.) on BZO (clonazepam ) PRN   Atherosclerosis of left carotid artery    Cardiac pacemaker in situ    Cervical myelopathy (HCC)    Cervical spinal stenosis    Congenital kyphosis, cervical region    Depression    Diastolic dysfunction    a.)  TTE 05/2021: EF 90%, mild LVH, trivial MR/TR/PR, G1DD   Esophageal reflux    Esophageal stricture    a.) s/p multiple (2012, 2015, 2017, 2020) dilitation procedures   Fibromyositis    GERD (gastroesophageal reflux disease)    Heart murmur    History of deep vein thrombosis    Hyperlipidemia    Hypertension    Insomnia    Lymphedema    Major depression, recurrent, full remission    Osteoarthritis    Osteoporosis    Other secondary kyphosis, cervical region    PAD (peripheral artery disease)    Peripheral nerve disease    PONV (postoperative nausea and vomiting)    Presence of  permanent cardiac pacemaker 05/2015   a.) St. Jude device   Restless legs syndrome    RLS (restless legs syndrome)    a.) takes pramipexole    Sleep apnea    SOB (shortness of breath)    Spinal stenosis of lumbar region    Spondylolisthesis of lumbar region    SUI (stress urinary incontinence, female)    T2DM (type 2 diabetes mellitus) (HCC)    Urinary incontinence    Venous insufficiency of both lower extremities     Current Outpatient Medications  Medication Sig Dispense Refill   aspirin  81 MG EC tablet Take 81 mg by mouth daily.     Cyanocobalamin  (VITAMIN B 12 PO) Take by mouth.     Iron Combinations (IRON COMPLEX PO) Take by mouth.     metoprolol  tartrate (LOPRESSOR ) 25 MG tablet TAKE 1 TABLET BY MOUTH TWICE  DAILY 200 tablet 1   pramipexole  (MIRAPEX ) 1 MG tablet Take 1 tablet (1 mg total) by mouth 2 (two) times daily. 180 tablet 3   pregabalin  (LYRICA ) 100 MG capsule Take 1 capsule (100 mg total) by mouth 3 (three) times daily. 90 capsule 2   traMADol  (ULTRAM ) 50 MG tablet Take 1 tablet (50 mg total) by mouth every 8 (eight) hours as needed. 60 tablet 2   valsartan  (DIOVAN ) 80 MG tablet Take 1  tablet (80 mg total) by mouth daily. 90 tablet 3   VITAMIN D, CHOLECALCIFEROL, PO Take by mouth.     No current facility-administered medications for this visit.    Allergies  Allergen Reactions   Celebrex  [Celecoxib ] Other (See Comments)    Burning all over, increased heartbeat, upset stomach    Family History  Problem Relation Age of Onset   Heart attack Mother 48   Heart disease Father    Thyroid  disease Father    Skin cancer Father    Brain cancer Maternal Grandmother    Breast cancer Neg Hx     Social History   Socioeconomic History   Marital status: Married    Spouse name: Jesus Genova   Number of children: 6   Years of education: Not on file   Highest education level: Not on file  Occupational History   Occupation: Homemaker  Tobacco Use   Smoking status:  Former    Current packs/day: 0.00    Average packs/day: 1 pack/day for 25.0 years (25.0 ttl pk-yrs)    Types: Cigarettes    Start date: 86    Quit date: 2007    Years since quitting: 18.8    Passive exposure: Never   Smokeless tobacco: Never  Vaping Use   Vaping status: Never Used  Substance and Sexual Activity   Alcohol use: Never    Comment: very rarely   Drug use: Never   Sexual activity: Not Currently  Other Topics Concern   Not on file  Social History Narrative   Not on file   Social Drivers of Health   Financial Resource Strain: Medium Risk (11/30/2023)   Overall Financial Resource Strain (CARDIA)    Difficulty of Paying Living Expenses: Somewhat hard  Food Insecurity: Food Insecurity Present (11/24/2023)   Hunger Vital Sign    Worried About Running Out of Food in the Last Year: Sometimes true    Ran Out of Food in the Last Year: Never true  Transportation Needs: No Transportation Needs (11/30/2023)   PRAPARE - Administrator, Civil Service (Medical): No    Lack of Transportation (Non-Medical): No  Physical Activity: Inactive (11/24/2023)   Exercise Vital Sign    Days of Exercise per Week: 0 days    Minutes of Exercise per Session: 0 min  Stress: No Stress Concern Present (11/24/2023)   [employer] of Occupational Health - Occupational Stress Questionnaire    Feeling of Stress: Not at all  Social Connections: Unknown (11/24/2023)   Social Connection and Isolation Panel    Frequency of Communication with Friends and Family: Not on file    Frequency of Social Gatherings with Friends and Family: Not on file    Attends Religious Services: Not on file    Active Member of Clubs or Organizations: Not on file    Attends Banker Meetings: Not on file    Marital Status: Separated  Recent Concern: Social Connections - Socially Isolated (11/24/2023)   Social Connection and Isolation Panel    Frequency of Communication with Friends and Family: More  than three times a week    Frequency of Social Gatherings with Friends and Family: Once a week    Attends Religious Services: Never    Database Administrator or Organizations: No    Attends Banker Meetings: Never    Marital Status: Divorced  Catering Manager Violence: Not At Risk (11/24/2023)   Humiliation, Afraid, Rape, and Kick questionnaire  Fear of Current or Ex-Partner: No    Emotionally Abused: No    Physically Abused: No    Sexually Abused: No     Constitutional: Pt reports headache, fever. Denies malaise, fatigue, or abrupt weight changes.  HEENT: Pt reports runny nose and nasal congestion. Denies eye pain, eye redness, ear pain, ringing in the ears, wax buildup, bloody nose, or sore throat. Respiratory: Pt reports cough and shortness of breath. Denies difficulty breathing, or sputum production.   Cardiovascular: Denies chest pain, chest tightness, palpitations or swelling in the hands or feet.  Gastrointestinal: Denies abdominal pain, bloating, constipation, diarrhea or blood in the stool.  Musculoskeletal: Denies decrease in range of motion, difficulty with gait, muscle pain or joint pain and swelling.    No other specific complaints in a complete review of systems (except as listed in HPI above).      Objective:   Physical Exam  BP 130/70 (BP Location: Left Arm, Patient Position: Sitting, Cuff Size: Large)   Pulse 82   Ht 5' 1 (1.549 m)   Wt 237 lb (107.5 kg)   SpO2 97%   BMI 44.78 kg/m   Wt Readings from Last 3 Encounters:  12/20/23 240 lb (108.9 kg)  10/04/23 233 lb 8 oz (105.9 kg)  09/21/23 233 lb 8 oz (105.9 kg)    General: Appears her stated age, obese, in NAD. Skin: Warm, dry and intact. HEENT: Head: normal shape and size, no sinus pressure noted; Eyes: sclera white, no icterus, conjunctiva pink, PERRLA and EOMs intact; Nose: mucosa pink and moist, septum midline; Throat/Mouth: Teeth present, mucosa pink and moist, + PND, no exudate,  lesions or ulcerations noted.  Neck: No adenopathy noted. Cardiovascular: Normal rate and rhythm. S1,S2 noted.  Murmur noted. No JVD or BLE edema.  Pulmonary/Chest: Normal effort and positive vesicular breath sounds. No respiratory distress. No wheezes, rales or ronchi noted.  Neurological: Alert and oriented.   BMET    Component Value Date/Time   NA 143 12/20/2023 1146   K 4.7 12/20/2023 1146   CL 103 12/20/2023 1146   CO2 26 05/08/2023 1602   GLUCOSE 110 (H) 12/20/2023 1146   GLUCOSE 101 (H) 05/08/2023 1602   BUN 20 12/20/2023 1146   CREATININE 0.79 12/20/2023 1146   CREATININE 0.76 01/20/2023 1401   CALCIUM  9.2 12/20/2023 1146   GFRNONAA >60 05/08/2023 1602   GFRNONAA 93 09/12/2019 0803   GFRAA 107 09/12/2019 0803    Lipid Panel     Component Value Date/Time   CHOL 170 12/02/2022 1104   TRIG 66 12/02/2022 1104   HDL 56 12/02/2022 1104   CHOLHDL 3.0 12/02/2022 1104   LDLCALC 99 12/02/2022 1104    CBC    Component Value Date/Time   WBC 7.3 09/18/2023 1046   WBC 7.7 05/08/2023 1602   RBC 4.33 09/18/2023 1046   RBC 4.34 09/18/2023 1045   HGB 12.3 09/18/2023 1046   HCT 39.5 09/18/2023 1046   PLT 259 09/18/2023 1046   MCV 91.2 09/18/2023 1046   MCH 28.4 09/18/2023 1046   MCHC 31.1 09/18/2023 1046   RDW 15.7 (H) 09/18/2023 1046   LYMPHSABS 2.0 09/18/2023 1046   MONOABS 0.7 09/18/2023 1046   EOSABS 0.4 09/18/2023 1046   BASOSABS 0.0 09/18/2023 1046    Hgb A1C Lab Results  Component Value Date   HGBA1C 5.6 03/07/2023            Assessment & Plan:  Assessment and Plan    Acute  nasopharyngitis (common cold) Symptoms suggest viral infection or allergies. Considered antibiotics due to symptom duration and upcoming travel. - Prescribed Augmentin 875 mg/125 mg BID for 10 days. - Recommended OTC Zyrtec or Claritin for drainage. - Advised Flonase for nasal symptoms.  - Encouraged rest and fluids      Follow-up with your PCP as previously  scheduled Angeline Laura, NP

## 2024-01-18 ENCOUNTER — Other Ambulatory Visit: Payer: Self-pay

## 2024-01-18 NOTE — Patient Outreach (Signed)
 Social Drivers of Health  Community Resource and Care Coordination Visit Note   01/18/2024  Name: Janet [name] MRN: [id number] DOB:1950/05/04  Situation: Referral received for [facility] needs assessment and assistance related to Food Insecurity  utilities. I obtained verbal consent from Patient.  Visit completed with Patient on the phone.   Background:      Assessment:   Goals Addressed             This Visit's Progress    COMPLETED: BSW VBCI Social Work Care Plan       Problems:   Financial Strain   CSW Clinical Goal(s):   Over the next 30 days the Patient will work with Child Psychotherapist to address concerns related to financial strain.  Interventions:  SW will email resources for utilities and food to email address on file SW resent resources via email.   Patient Goals/Self-Care Activities:  Coordinate with resources SW sent  to assist with food and utilities.  Plan:   The patient has been provided with contact information for the care management team and has been advised to call with any health related questions or concerns.         Recommendation:   Patient will use resources received for assistance with food and utilities.  Follow Up Plan:   Patient has achieved all patient stated goals. [employer] will be closed. Patient has been provided contact information should new needs arise.   Thersia Hoar, HEDWIG, MHA [place]  [facility] Social Worker, Population Health [phone]

## 2024-01-18 NOTE — Patient Instructions (Signed)

## 2024-01-26 ENCOUNTER — Other Ambulatory Visit: Payer: Self-pay | Admitting: Family Medicine

## 2024-01-26 DIAGNOSIS — K219 Gastro-esophageal reflux disease without esophagitis: Secondary | ICD-10-CM

## 2024-01-30 NOTE — Telephone Encounter (Signed)
 Discontinued 12/20/23.  Requested Prescriptions  Pending Prescriptions Disp Refills   pantoprazole  (PROTONIX ) 40 MG tablet [Pharmacy Med Name: PANTOPRAZOLE  SOD DR 40 MG TAB] 180 tablet 0    Sig: TAKE 1 TABLET (40 MG TOTAL) BY MOUTH TWICE A DAY BEFORE MEALS     Gastroenterology: Proton Pump Inhibitors Passed - 01/30/2024  1:36 PM      Passed - Valid encounter within last 12 months    Recent Outpatient Visits           3 weeks ago Acute nasopharyngitis   [place] [facility] [place], Angeline ORN, NP   3 months ago Chronic pelvic pain in female   [facility] [place] Marsa PARAS, DO   9 months ago Gastroesophageal reflux disease without esophagitis   [place] [facility] [place], Marsa PARAS, [place]

## 2024-02-12 ENCOUNTER — Other Ambulatory Visit: Payer: Self-pay | Admitting: Internal Medicine

## 2024-02-12 DIAGNOSIS — E859 Amyloidosis, unspecified: Secondary | ICD-10-CM

## 2024-02-23 NOTE — Progress Notes (Signed)
" °  Device system confirmed to be MRI conditional, with implant date > 6 weeks ago, and no evidence of abandoned or epicardial leads in review of most recent CXR  Device last cleared by EP Provider: Charlies Arthur 02/23/24  Clearance is good through for 1 year as long as parameters remain stable at time of check. If pt undergoes a cardiac device procedure during that time, they should be re-cleared.   Tachy-therapies to be programmed off if applicable with device back to pre-MRI settings after completion of exam.  [employer] will be present for programming for the MRI.   Rocky Catalan, RT  02/23/2024 10:25 AM     "

## 2024-02-27 ENCOUNTER — Ambulatory Visit (HOSPITAL_COMMUNITY)
Admission: RE | Admit: 2024-02-27 | Discharge: 2024-02-27 | Disposition: A | Discharge status: Home / Self Care | Source: Ambulatory Visit | Attending: Pain Medicine | Admitting: Pain Medicine

## 2024-02-27 DIAGNOSIS — M25561 Pain in right knee: Secondary | ICD-10-CM | POA: Insufficient documentation

## 2024-02-27 DIAGNOSIS — M5441 Lumbago with sciatica, right side: Secondary | ICD-10-CM | POA: Diagnosis present

## 2024-02-27 DIAGNOSIS — M25562 Pain in left knee: Secondary | ICD-10-CM | POA: Insufficient documentation

## 2024-02-27 DIAGNOSIS — G8929 Other chronic pain: Secondary | ICD-10-CM

## 2024-02-27 DIAGNOSIS — M542 Cervicalgia: Secondary | ICD-10-CM | POA: Insufficient documentation

## 2024-02-27 DIAGNOSIS — M48061 Spinal stenosis, lumbar region without neurogenic claudication: Secondary | ICD-10-CM | POA: Diagnosis not present

## 2024-02-27 DIAGNOSIS — M25512 Pain in left shoulder: Secondary | ICD-10-CM | POA: Diagnosis present

## 2024-02-27 DIAGNOSIS — M47816 Spondylosis without myelopathy or radiculopathy, lumbar region: Secondary | ICD-10-CM

## 2024-02-27 DIAGNOSIS — M25551 Pain in right hip: Secondary | ICD-10-CM | POA: Insufficient documentation

## 2024-02-27 DIAGNOSIS — Z9889 Other specified postprocedural states: Secondary | ICD-10-CM | POA: Diagnosis present

## 2024-02-27 DIAGNOSIS — M51369 Other intervertebral disc degeneration, lumbar region without mention of lumbar back pain or lower extremity pain: Secondary | ICD-10-CM

## 2024-02-27 DIAGNOSIS — Z96653 Presence of artificial knee joint, bilateral: Secondary | ICD-10-CM | POA: Insufficient documentation

## 2024-02-27 DIAGNOSIS — M25552 Pain in left hip: Secondary | ICD-10-CM | POA: Insufficient documentation

## 2024-02-27 DIAGNOSIS — M79601 Pain in right arm: Secondary | ICD-10-CM | POA: Insufficient documentation

## 2024-02-27 DIAGNOSIS — M549 Dorsalgia, unspecified: Secondary | ICD-10-CM | POA: Insufficient documentation

## 2024-02-27 DIAGNOSIS — G8928 Other chronic postprocedural pain: Secondary | ICD-10-CM | POA: Insufficient documentation

## 2024-02-27 DIAGNOSIS — M79605 Pain in left leg: Secondary | ICD-10-CM | POA: Diagnosis present

## 2024-02-27 DIAGNOSIS — M79604 Pain in right leg: Secondary | ICD-10-CM | POA: Insufficient documentation

## 2024-02-27 DIAGNOSIS — M961 Postlaminectomy syndrome, not elsewhere classified: Secondary | ICD-10-CM

## 2024-02-27 DIAGNOSIS — M25511 Pain in right shoulder: Secondary | ICD-10-CM | POA: Diagnosis present

## 2024-02-27 DIAGNOSIS — M79602 Pain in left arm: Secondary | ICD-10-CM | POA: Diagnosis present

## 2024-02-27 DIAGNOSIS — M5442 Lumbago with sciatica, left side: Secondary | ICD-10-CM | POA: Diagnosis present

## 2024-02-27 NOTE — CV Procedure (Signed)
" °  Device system confirmed to be MRI conditional, with implant date > 6 weeks ago, and no evidence of abandoned or epicardial leads in review of most recent CXR  Device last cleared by EP Provider: Leverne 02/27/24  Clearance is good through for 1 year as long as parameters remain stable at time of check. If pt undergoes a cardiac device procedure during that time, they should be re-cleared.   Tachy-therapies to be programmed off if applicable with device back to pre-MRI settings after completion of exam.  [employer] will be present for programming for the MRI.   Suzan Recardo Arna Debby  02/27/2024 1:52 PM     "

## 2024-02-27 NOTE — Progress Notes (Signed)
 Patient was monitored by this RN during MRI scan due to presence of a pacemaker. Cardiac rhythm was continuously monitored throughout the procedure. Prior to the start of the scan, the pacemaker was placed in MRI-safe mode by the MRI technician and/or pacemaker representative. Following the completion of the scan, the device was returned to its pre-MRI settings. Neurological status and orientation post-procedure were unchanged from baseline.   Patient was programmed by [facility] rep, Dorise Regal.   Pre-procedure Heart Rate (Prior to being placed in MRI safe mode):80s Post-procedure Heart Rate (Once pacemaker is returned to baseline mode):80s

## 2024-02-28 ENCOUNTER — Encounter (HOSPITAL_COMMUNITY): Payer: Self-pay

## 2024-02-28 ENCOUNTER — Other Ambulatory Visit: Payer: Self-pay | Admitting: Family Medicine

## 2024-02-28 DIAGNOSIS — I10 Essential (primary) hypertension: Secondary | ICD-10-CM

## 2024-02-29 NOTE — Telephone Encounter (Signed)
 Requested Prescriptions  Pending Prescriptions Disp Refills   valsartan  (DIOVAN ) 80 MG tablet [Pharmacy Med Name: VALSARTAN  80 MG TABLET] 90 tablet 0    Sig: TAKE 1 TABLET BY MOUTH EVERY DAY     Cardiovascular:  Angiotensin Receptor Blockers Passed - 02/29/2024 10:44 AM      Passed - Cr in normal range and within 180 days    Creat  Date Value Ref Range Status  01/20/2023 0.76 0.60 - 1.00 mg/dL Final   Creatinine, Ser  Date Value Ref Range Status  12/20/2023 0.79 0.57 - 1.00 mg/dL Final   Creatinine, Urine  Date Value Ref Range Status  09/10/2021 32 20 - 275 mg/dL Final         Passed - K in normal range and within 180 days    Potassium  Date Value Ref Range Status  12/20/2023 4.7 3.5 - 5.2 mmol/L Final         Passed - Patient is not pregnant      Passed - Last BP in normal range    BP Readings from Last 1 Encounters:  01/04/24 130/70         Passed - Valid encounter within last 6 months    Recent Outpatient Visits           1 month ago Acute nasopharyngitis   [facility] [place], Angeline ORN, NP   4 months ago Chronic pelvic pain in female   [facility] [place], Marsa PARAS, DO   10 months ago Gastroesophageal reflux disease without esophagitis   [facility] [place], Marsa PARAS, [place]

## 2024-03-05 ENCOUNTER — Ambulatory Visit: Payer: Self-pay

## 2024-03-05 NOTE — Telephone Encounter (Signed)
 FYI Only or Action Required?: Action required by provider: request for appointment.  Patient was last seen in primary care on 01/04/2024 by Antonette Angeline ORN, NP.  Called Nurse Triage reporting Foot Swelling.  Symptoms began several weeks ago.  Interventions attempted: Rest, hydration, or home remedies.  Symptoms are: unchanged. Both legs swollen up to knees. Does not go down with elevation. Pain 4-5/10. States she has shortness of breath all the time, it's not new for me.  Triage Disposition: See Physician Within 24 Hours  Patient/caregiver understands and will follow disposition?: Yes      Copied from CRM #[id number]. Topic: Clinical - Red Word Triage >> Mar 05, 2024  9:17 AM Janet [name] wrote: Red Word that prompted transfer to Nurse Triage:  Both feet are swollen double in size, left is worse. They are sometimes warm to touch. This has been going on for almost 2 weeks and getting  worse. Pain level 4-5 Reason for Disposition  [1] MODERATE leg swelling (e.g., swelling extends up to knees) AND [2] new-onset or getting worse  Answer Assessment - Initial Assessment Questions 1. ONSET: When did the swelling start? (e.g., minutes, hours, days)     2 weeks 2. LOCATION: What part of the leg is swollen?  Are both legs swollen or just one leg?     Both feet and ankles 3. SEVERITY: How bad is the swelling? (e.g., localized; mild, moderate, severe)     Up to knee 4. REDNESS: Is there redness or signs of infection?     no 5. PAIN: Is the swelling painful to touch? If Yes, ask: How painful is it?   (Scale 1-10; mild, moderate or severe)     4-5/10 6. FEVER: Do you have a fever? If Yes, ask: What is it, how was it measured, and when did it start?      no 7. CAUSE: What do you think is causing the leg swelling?     unsure 8. MEDICAL HISTORY: Do you have a history of blood clots (e.g., DVT), cancer, heart failure, kidney disease, or liver failure?     no 9. RECURRENT  SYMPTOM: Have you had leg swelling before? If Yes, ask: When was the last time? What happened that time?     yes 10. OTHER SYMPTOMS: Do you have any other symptoms? (e.g., chest pain, difficulty breathing)       no 11. PREGNANCY: Is there any chance you are pregnant? When was your last menstrual period?       no  Protocols used: Leg Swelling and Edema-A-AH

## 2024-03-06 ENCOUNTER — Ambulatory Visit

## 2024-03-06 VITALS — BP 152/80 | HR 79 | Ht 61.0 in | Wt 243.0 lb

## 2024-03-06 DIAGNOSIS — R609 Edema, unspecified: Secondary | ICD-10-CM | POA: Diagnosis not present

## 2024-03-06 DIAGNOSIS — Z95811 Presence of heart assist device: Secondary | ICD-10-CM | POA: Diagnosis not present

## 2024-03-06 DIAGNOSIS — G959 Disease of spinal cord, unspecified: Secondary | ICD-10-CM | POA: Diagnosis not present

## 2024-03-06 DIAGNOSIS — J42 Unspecified chronic bronchitis: Secondary | ICD-10-CM

## 2024-03-06 DIAGNOSIS — E1149 Type 2 diabetes mellitus with other diabetic neurological complication: Secondary | ICD-10-CM

## 2024-03-06 NOTE — Progress Notes (Signed)
 " .....    Acute Patient Visit  Physician: Tache Bobst A Else Habermann, MD  Patient: Janet [name] MRN: [id number] DOB: 1950/06/15 PCP: Edman Marsa PARAS, DO     Subjective:   Chief Complaint  Patient presents with   Leg Swelling    Patient is concerned about swelling in both but  more in the left leg. States its painful   This patient is not established with this clinician as his/her primary care physician. The visit was scheduled in a standard 20-minute slot that was not appropriate for the patients clinical complexity or acuity.  The scope, constraints, and potential risks associated with the visit structure were discussed with the patient. Evaluation and management were performed to address prioritized concerns based on information available at the time of the encounter, with recommendations for appropriate follow-up and further evaluation.  The patient is to seek additional or emergency care if any change in their condition.    HPI: The patient is a 74 y.o. female who presents today for:   Discussed the use of AI scribe software for clinical note transcription with the patient, who gave verbal consent to proceed.  History of Present Illness       Patient with a history of lymphedema, venous insufficiency as well as type 2 diabetes and history of pacemaker, seen for worsening lower extremity edema she also has a history of PAD.  Reporting over the last week lower extremity swelling bilaterally is significantly worse.  No shortness of breath chest pain or other symptoms.  She has been unable to tolerate compression stockings due to general tightness in her legs.  Patient seen on the 15th by cardiology and she does continue to follow-up with pacemaker clinic.  Blood pressure in their office was 122/84 but is somewhat elevated today.  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  Cardiology notes reviewed.  ROS:   As noted in  the HPI    ASSESMENT/PLAN:  Encounter Diagnoses  Name Primary?   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  Multifactorial but seems primarily due to significant and severe chronic venous insufficiency.  We discussed use of diabetic stocks or other types of compression stockings for use.  Given her general comorbidity we will put in for basic labs just to check kidney function.  Will have her follow-up with her regular primary care physician  2.  In regard to hypertension blood pressure somewhat elevated today.  Keep blood pressure log for PCP.  Patient to continue with metoprolol , valsartan  80 mg daily.  Discussed with patient impossible to evaluate her thoroughly due to time constraints she is not my patient.  Follow-up and seek medical attention if any worsening of symptoms           OBJECTIVE: Vitals:   03/06/24 0949  BP: (!) 157/88  Pulse: 79  SpO2: 97%  Weight: 243 lb (110.2 kg)  Height: 5' 1 (1.549 m)    Body mass index is 45.91 kg/m.  Physical Exam Vitals reviewed.  Constitutional:      Appearance: Normal appearance. Well-developed with normal weight.  Cardiovascular:     Rate and Rhythm: Normal rate and regular rhythm. Normal heart sounds. Normal peripheral pulses.  Bilateral venous stasis dermatitis with +1 pitting edema ankles to knees Pulmonary:     Normal breath sounds with normal effort Skin:    General: Skin is warm and dry without noticeable rash. Neurological:     General: No focal deficit present.   Psychiatric:        Mood and Affect: Mood, behavior and cognition normal       Allergies Patient is allergic to celebrex  [celecoxib ].  Past Medical History Patient  has a past medical history of Anxiety, Atherosclerosis of left carotid artery, Cardiac pacemaker in situ, Cervical myelopathy (HCC), Cervical spinal stenosis, Congenital kyphosis, cervical region, Depression, Diastolic dysfunction, Esophageal reflux, Esophageal stricture, Fibromyositis, GERD (gastroesophageal reflux disease), Heart murmur, History of deep vein thrombosis, Hyperlipidemia, Hypertension, Insomnia, Lymphedema, Major depression, recurrent, full remission, Osteoarthritis, Osteoporosis, Other secondary kyphosis, cervical region, PAD (peripheral artery disease), Peripheral nerve disease, PONV (postoperative nausea and vomiting), 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  Surgical History Patient  has a past surgical history that includes Carpal tunnel release (Left, 1998); vein removal (Bilateral, 2012); Esophageal dilation (2012); Appendectomy (2014); Polypectomy; PACEMAKER IMPLANT (05/2015); Hernia repair (2019); Gastric bypass (2019); left knee manipulation (Left, 02/05/2019); Shoulder surgery (Left, 2014); Shoulder surgery (Right, 2017); Total knee arthroplasty (Right, 2018); Total knee arthroplasty (Left, 10/18/2018); Anterior lateral lumbar fusion with percutaneous screw 2 level (N/A, 01/26/2022); Application of intraoperative CT scan (N/A, 01/26/2022); Joint replacement; Abdominal hysterectomy; Breast surgery; Colonoscopy (N/A, 07/28/2023); Esophagogastroduodenoscopy (N/A, 07/28/2023); and Anterior  cervical decomp/discectomy fusion (N/A, 11/03/2021).  Family History Pateint's family history includes Brain cancer in her maternal grandmother; Heart attack (age of onset: 69) in her mother; Heart disease in her father; Skin cancer in her father; Thyroid  disease in her father.  Social History Patient  reports that she quit smoking about 19 years ago. Her smoking use included cigarettes. She started smoking about 44 years ago. She has a 25 pack-year smoking history. She has never been exposed to tobacco smoke. She has never used smokeless tobacco. She reports that she does not drink alcohol and does not use drugs.    03/06/2024 "

## 2024-03-07 ENCOUNTER — Encounter (HOSPITAL_COMMUNITY): Payer: Self-pay

## 2024-03-07 LAB — CBC WITH DIFFERENTIAL/PLATELET
Absolute Lymphocytes: 2074 {cells}/uL (ref 850–3900)
Absolute Monocytes: 641 {cells}/uL (ref 200–950)
Basophils Absolute: 22 {cells}/uL (ref 0–200)
Basophils Relative: 0.3 %
Eosinophils Absolute: 418 {cells}/uL (ref 15–500)
Eosinophils Relative: 5.8 %
HCT: 38.2 % (ref 35.9–46.0)
Hemoglobin: 11.9 g/dL (ref 11.7–15.5)
MCH: 28.3 pg (ref 27.0–33.0)
MCHC: 31.2 g/dL — ABNORMAL LOW (ref 31.6–35.4)
MCV: 90.7 fL (ref 81.4–101.7)
MPV: 11.9 fL (ref 7.5–12.5)
Monocytes Relative: 8.9 %
Neutro Abs: 4046 {cells}/uL (ref 1500–7800)
Neutrophils Relative %: 56.2 %
Platelets: 264 Thousand/uL (ref 140–400)
RBC: 4.21 Million/uL (ref 3.80–5.10)
RDW: 14 % (ref 11.0–15.0)
Total Lymphocyte: 28.8 %
WBC: 7.2 Thousand/uL (ref 3.8–10.8)

## 2024-03-07 LAB — MICROALBUMIN / CREATININE URINE RATIO
Creatinine, Urine: 96 mg/dL (ref 20–275)
Microalb Creat Ratio: 6 mg/g{creat}
Microalb, Ur: 0.6 mg/dL

## 2024-03-07 NOTE — CV Procedure (Signed)
" °  Device system confirmed to be MRI conditional, with implant date > 6 weeks ago, and no evidence of abandoned or epicardial leads in review of most recent CXR  Device last cleared by EP Provider: Prentice Passey 03/07/2024  Clearance is good through for 1 year as long as parameters remain stable at time of check. If pt undergoes a cardiac device procedure during that time, they should be re-cleared.   Tachy-therapies to be programmed off if applicable with device back to pre-MRI settings after completion of exam.  [employer] will be present for programming for the MRI.   Rocky Catalan, RT  03/07/2024 11:37 AM     "

## 2024-03-11 ENCOUNTER — Encounter (HOSPITAL_COMMUNITY): Payer: Self-pay

## 2024-03-11 ENCOUNTER — Telehealth: Payer: Self-pay

## 2024-03-11 DIAGNOSIS — I495 Sick sinus syndrome: Secondary | ICD-10-CM

## 2024-03-11 DIAGNOSIS — Z95811 Presence of heart assist device: Secondary | ICD-10-CM

## 2024-03-11 NOTE — Telephone Encounter (Signed)
 Referral placed to [facility] Dr Florencio, keep current cardiologist. Needs referral for insurance purposes with [facility].  Janet [name] - are you able to complete her referral to keep her with Dr Florencio ?  Once you are done with this referral - can you notify us  so we can call her back to share the information? Or if you prefer to call her and let her know that the referral is submitted - either way is fine.  Also would this affect her MRI scan on 03/12/24 Tuesday?  Thank you!  [name], DO [facility] 03/11/2024, 5:29 PM

## 2024-03-11 NOTE — Addendum Note (Signed)
 Addended by: EDMAN MARSA PARAS on: 03/11/2024 05:30 PM   Modules accepted: Orders

## 2024-03-11 NOTE — Telephone Encounter (Signed)
 Copied from CRM [id number]. Topic: Referral - Request for Referral >> Mar 11, 2024 11:11 AM Tiffany B wrote: Patient is requesting a referral to [facility] cardiology due to her having new insurance [facility]). Patient stated in the past a referral was not needed and PCP is aware she is seeing specialist. Patient has a  Mr Card Morphology Wo/W Cm 03/12/2024.   Please send referral to [facility]. Patient would like a follow up call once referral is placed.

## 2024-03-12 ENCOUNTER — Other Ambulatory Visit: Payer: Self-pay | Admitting: Internal Medicine

## 2024-03-12 ENCOUNTER — Ambulatory Visit (HOSPITAL_COMMUNITY)
Admission: RE | Admit: 2024-03-12 | Discharge: 2024-03-12 | Disposition: A | Discharge status: Home / Self Care | Source: Ambulatory Visit | Attending: Internal Medicine | Admitting: Internal Medicine

## 2024-03-12 DIAGNOSIS — E859 Amyloidosis, unspecified: Secondary | ICD-10-CM | POA: Diagnosis not present

## 2024-03-12 MED ORDER — GADOBUTROL 1 MMOL/ML IV SOLN
10.0000 mL | Freq: Once | INTRAVENOUS | Status: AC | PRN
  Administered 2024-03-12: 10 mL via INTRAVENOUS

## 2024-03-12 NOTE — Telephone Encounter (Signed)
 Can you briefly call patient today to tell her that our referral coordinator has successfully placed the referral to Dr [facility] through [facility] insurance? Thank you  [name], DO [facility] 03/12/2024, 9:27 AM

## 2024-03-12 NOTE — Progress Notes (Signed)
 Patient was monitored by this RN during MRI scan due to presence of a pacemaker. Cardiac rhythm was continuously monitored throughout the procedure. Prior to the start of the scan, the pacemaker was placed in MRI-safe mode by the MRI technician and/or pacemaker representative. Following the completion of the scan, the device was returned to its pre-MRI settings. Neurological status and orientation post-procedure were unchanged from baseline.   Pre-procedure Heart Rate (Prior to being placed in MRI safe mode): 70 bpm Post-procedure Heart Rate (Once pacemaker is returned to baseline mode):  78 bpm

## 2024-03-12 NOTE — Telephone Encounter (Signed)
 Patient notified referral has been placed and someone should be reaching out to get her scheduled.

## 2024-03-13 ENCOUNTER — Encounter (HOSPITAL_COMMUNITY): Payer: Self-pay

## 2024-03-14 ENCOUNTER — Encounter: Payer: Self-pay | Admitting: Family Medicine

## 2024-03-14 ENCOUNTER — Ambulatory Visit: Admitting: Family Medicine

## 2024-03-14 VITALS — BP 150/80 | HR 72 | Ht 61.0 in | Wt 243.2 lb

## 2024-03-14 DIAGNOSIS — D72820 Lymphocytosis (symptomatic): Secondary | ICD-10-CM | POA: Diagnosis not present

## 2024-03-14 DIAGNOSIS — M85612 Other cyst of bone, left shoulder: Secondary | ICD-10-CM | POA: Diagnosis not present

## 2024-03-14 DIAGNOSIS — I872 Venous insufficiency (chronic) (peripheral): Secondary | ICD-10-CM | POA: Diagnosis not present

## 2024-03-14 DIAGNOSIS — Z6841 Body Mass Index (BMI) 40.0 and over, adult: Secondary | ICD-10-CM

## 2024-03-14 DIAGNOSIS — I890 Lymphedema, not elsewhere classified: Secondary | ICD-10-CM | POA: Diagnosis not present

## 2024-03-14 DIAGNOSIS — Z23 Encounter for immunization: Secondary | ICD-10-CM

## 2024-03-14 DIAGNOSIS — I10 Essential (primary) hypertension: Secondary | ICD-10-CM | POA: Diagnosis not present

## 2024-03-14 MED ORDER — HYDROCHLOROTHIAZIDE 25 MG PO TABS: 25.0000 mg | ORAL_TABLET | Freq: Every day | ORAL | 1 refills | Status: AC

## 2024-03-14 MED ORDER — CEPHALEXIN 500 MG PO CAPS: 500.0000 mg | ORAL_CAPSULE | Freq: Three times a day (TID) | ORAL | 0 refills | Status: AC

## 2024-03-14 NOTE — Progress Notes (Signed)
 "  Subjective:    Patient ID: Janet [name], female    DOB: 1950-07-26, 74 y.o.   MRN: [id number]  Janet [name] is a 74 y.o. female presenting on 03/14/2024 for Leg Swelling (About 3 weeks )   HPI  Discussed the use of AI scribe software for clinical note transcription with the patient, who gave verbal consent to proceed.  History of Present Illness   Janet [name] is a 74 year old female with hypertension and lymphedema who presents with left shoulder pain and swelling.  Left shoulder pain - Significant pain localized to the anterior left shoulder - Pain aggravated by repetitive overhead activities, such as reaching for items in cabinets - No recent use of topical treatments for pain - Tramadol  available at home but not taken recently - Left shoulder x-ray performed in October 2025; specific findings unknown  Lower extremity edema and dermatitis - Chronic swelling in both legs, more pronounced in the left leg - Left leg described as very hard, sometimes red and irritated, consistent with dermatitis - Swelling never fully resolves - Difficulty using compression stockings due to effort required to put them on - Evaluation by a specialist for this issue approximately two years ago  Hypertension - Elevated home blood pressure readings, typically in the 150s to 180s systolic and 80s to 90s diastolic - Recent blood pressure reading of 188/98 - Takes metoprolol  in the morning, once daily, instead of the prescribed twice daily regimen - Previously took a combination antihypertensive medication that included a diuretic, which is no longer part of her regimen     Major Depression recurrent in remission Not on treatment currently Mood improved     03/14/2024   11:25 PM 12/20/2023   11:10 AM 11/24/2023    3:29 PM  Depression screen PHQ 2/9  Decreased Interest 0 0 0  Down, Depressed, Hopeless 0 1 1  PHQ - 2 Score 0 1 1  Altered sleeping 0  0  Tired, decreased energy 0  1  Change in  appetite 0  0  Feeling bad or failure about yourself  0  0  Trouble concentrating 0  0  Moving slowly or fidgety/restless 0  0  Suicidal thoughts 0  0  PHQ-9 Score 0  2   Difficult doing work/chores Not difficult at all  Not difficult at all     Data saved with a previous flowsheet row definition       10/04/2023    8:18 AM 02/09/2023    9:29 AM 01/20/2023    2:20 PM 12/02/2022   10:28 AM  GAD 7 : Generalized Anxiety Score  Nervous, Anxious, on Edge 1 1 1 1   Control/stop worrying 1 0 0 1  Worry too much - different things 1 1 0 1  Trouble relaxing 1 1 1 1   Restless 1 2 1 1   Easily annoyed or irritable 0 1 0 0  Afraid - awful might happen 0 0 0 0  Total GAD 7 Score 5 6 3 5   Anxiety Difficulty Somewhat difficult  Not difficult at all     Social History[1]  Review of Systems Per HPI unless specifically indicated above     Objective:    BP (!) 150/80 (BP Location: Left Arm, Cuff Size: Normal)   Pulse 72   Ht 5' 1 (1.549 m)   Wt 243 lb 4 oz (110.3 kg)   SpO2 98%   BMI 45.96 kg/m   Wt Readings from Last 3  Encounters:  03/14/24 243 lb 4 oz (110.3 kg)  03/06/24 243 lb (110.2 kg)  01/04/24 237 lb (107.5 kg)    Physical Exam  I have personally reviewed the radiology report from 12/21/23 on Left Shoulder X-ray.  EXAM: 1 VIEW XRAY OF THE LEFT SHOULDER 12/21/2023 10:35:00 AM   COMPARISON: None available.   CLINICAL HISTORY:   FINDINGS:   BONES AND JOINTS: Glenohumeral joint is normally aligned. No acute fracture or dislocation. Mild inferior glenoid spurring. Small subcortical cyst at greater tuberosity. Mild acromioclavicular osteoarthrosis.   SOFT TISSUES: Left subclavian pacemaker in place. Small subacromial calcification suggestive of calcific tendinosis. No other abnormal calcifications. Visualized lung is unremarkable.   IMPRESSION: 1. Mild glenohumeral and acromioclavicular osteoarthrosis. 2. Calcific tendinosis.   Electronically signed by:  Donnice Mania MD 12/22/2023 05:35 PM EDT RP Workstation: [id number]  Results for orders placed or performed in visit on 03/06/24  CBC with Differential/Platelet   Collection Time: 03/06/24 10:21 AM  Result Value Ref Range   WBC 7.2 3.8 - 10.8 Thousand/uL   RBC 4.21 3.80 - 5.10 Million/uL   Hemoglobin 11.9 11.7 - 15.5 g/dL   HCT 61.7 64.0 - 53.9 %   MCV 90.7 81.4 - 101.7 fL   MCH 28.3 27.0 - 33.0 pg   MCHC 31.2 (L) 31.6 - 35.4 g/dL   RDW 85.9 88.9 - 84.9 %   Platelets 264 140 - 400 Thousand/uL   MPV 11.9 7.5 - 12.5 fL   Neutro Abs 4,046 1,500 - 7,800 cells/uL   Absolute Lymphocytes 2,074 850 - 3,900 cells/uL   Absolute Monocytes 641 200 - 950 cells/uL   Eosinophils Absolute 418 15 - 500 cells/uL   Basophils Absolute 22 0 - 200 cells/uL   Neutrophils Relative % 56.2 %   Total Lymphocyte 28.8 %   Monocytes Relative 8.9 %   Eosinophils Relative 5.8 %   Basophils Relative 0.3 %  Microalbumin / creatinine urine ratio   Collection Time: 03/06/24 10:21 AM  Result Value Ref Range   Creatinine, Urine 96 20 - 275 mg/dL   Microalb, Ur 0.6 mg/dL   Microalb Creat Ratio 6 <30 mg/g creat       Assessment & Plan:   Problem List Items Addressed This Visit     Lymphedema (Chronic)   Relevant Orders   Ambulatory referral to Vascular Surgery   Morbid obesity with BMI of 45.0-49.9, adult (HCC)   Other Visit Diagnoses       Cyst of bone of left shoulder    -  Primary     Flu vaccine need       Relevant Orders   Flu vaccine HIGH DOSE PF(Fluzone Trivalent) (Completed)     Acute stasis dermatitis of left lower extremity       Relevant Medications   cephALEXin  (KEFLEX ) 500 MG capsule   hydrochlorothiazide  (HYDRODIURIL ) 25 MG tablet     Essential hypertension       Relevant Medications   hydrochlorothiazide  (HYDRODIURIL ) 25 MG tablet     Monoclonal B-cell lymphocytosis       Relevant Orders   Ambulatory referral to Hematology / Oncology        Cyst of bone of left  shoulder Chronic pain and swelling due to subcortical cyst on the greater tubercle of the humerus. Pain worsens with repetitive movements and lifting. Identified on X-ray - Apply Voltaren gel up to four times daily. - Use ice packs to reduce inflammation. - Avoid repetitive lifting and shoulder strain. -  Rest shoulder as much as possible.  Lymphedema and stasis dermatitis of left lower extremity Chronic lymphedema and stasis dermatitis with swelling, redness, and infection risk. Compression therapy is challenging. Previous ultrasound showed small reflux. - Referred to resume evaluation by vascular surgery for potential lymphedema pump compression device. - Prescribed Keflex  for infection prevention. - Recommended elevation and compression therapy as tolerated. - Advised use of Ace Wrap if compression is uncomfortable.  Essential hypertension Hypertension with recent readings of 188/98 mmHg. Repeat manual improved. Current medications include metoprolol  and valsartan . Weight gain may contribute to elevated blood pressure. Previous diuretic therapy was discontinued due to low blood pressure 1 year ago but may be beneficial to restart. - She was only taking Metoprolol  25mg  in AM and not PM dose by accident - Resume TWICE A DAY dosing on metoprolol  - Continued valsartan  80 mg daily. - Restarted HCTZ 25 mg daily. Consider adjust to 12.5 if need - Advised on lifestyle modifications: reduce sodium intake, stay active, maintain hydration.  Morbid obesity BMI >45 Contributing to hypertension and possibly affecting lymphedema management.  General Health Maintenance Flu vaccination administered today. - Continue routine health maintenance and vaccinations as recommended.      Major Depression recurrent in remission Controlled Not on therapy  Needs new referral to Heme Onc [facility] Dr Babara - has apt next week 03/21/24 Monoclonal B-cell lymphocytosis existing care  Orders Placed This Encounter   Procedures   Flu vaccine HIGH DOSE PF(Fluzone Trivalent)   Ambulatory referral to Vascular Surgery    Referral Priority:   Routine    Referral Type:   Surgical    Referral Reason:   Specialty Services Required    Requested Specialty:   Vascular Surgery    Number of Visits Requested:   1   Ambulatory referral to Hematology / Oncology    Referral Priority:   Routine    Referral Type:   Consultation    Referral Reason:   Benign Hematology    Requested Specialty:   Oncology    Number of Visits Requested:   1    Meds ordered this encounter  Medications   cephALEXin  (KEFLEX ) 500 MG capsule    Sig: Take 1 capsule (500 mg total) by mouth 3 (three) times daily. For 7 days    Dispense:  21 capsule    Refill:  0   hydrochlorothiazide  (HYDRODIURIL ) 25 MG tablet    Sig: Take 1 tablet (25 mg total) by mouth daily.    Dispense:  90 tablet    Refill:  1    Follow up plan: Return in about 3 months (around 06/12/2024) for 3 month HTN, Lymphedema, updates.   [name], DO [facility] 03/14/2024, 9:39 AM     [1]  Social History Tobacco Use   Smoking status: Former    Current packs/day: 0.00    Average packs/day: 1 pack/day for 25.0 years (25.0 ttl pk-yrs)    Types: Cigarettes    Start date: 27    Quit date: 2007    Years since quitting: 19.0    Passive exposure: Never   Smokeless tobacco: Never  Vaping Use   Vaping status: Never Used  Substance Use Topics   Alcohol use: Never    Comment: very rarely   Drug use: Never   "

## 2024-03-14 NOTE — Patient Instructions (Addendum)
 Thank you for coming to the office today.  Medication changes  Metoprolol  25mg  should be TWICE a day. 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 It was on X-ray 11/2023  For the shoulder -  START anti inflammatory topical - OTC Voltaren (generic Diclofenac) topical 2-4 times a day as needed for pain swelling of affected joint for 1-2 weeks or longer.  Okay to use ice packs as well.  Rest the shoulder if possible, try to avoid repetition  -----  Referral back to Dr Babara  Antibiotic Keflex  for Cellulitis or Stasis Dermatitis Left Leg  Use RICE therapy: - R - Rest / relative rest with activity modification avoid overuse of joint - I - Ice packs (make sure you use a towel or sock / something to protect skin) - C - Compression with ACE wrap to apply pressure and reduce swelling allowing more support - E - Elevation - if significant swelling, lift leg above heart level (toes above your nose) to help reduce swelling, most helpful at night after day of being on your feet   REFERRAL TO RETURN TO VASCULAR  [facility] [address] [place],  [place]  [address] Main: [phone]   Please schedule a Follow-up Appointment to: Return in about 3 months (around 06/12/2024) for 3 month HTN, Lymphedema, updates.  If you have any other questions or concerns, please feel free to call the office or send a message through MyChart. You may also schedule an earlier appointment if necessary.  Additionally, you may be receiving a survey about your experience at our office within a few days to 1 week by e-mail or mail. We value your feedback.  [name], DO [facility], [place]

## 2024-03-18 ENCOUNTER — Inpatient Hospital Stay: Attending: Oncology

## 2024-03-18 DIAGNOSIS — D72820 Lymphocytosis (symptomatic): Secondary | ICD-10-CM

## 2024-03-18 LAB — CBC WITH DIFFERENTIAL (CANCER CENTER ONLY)
Abs Immature Granulocytes: 0.01 K/uL (ref 0.00–0.07)
Basophils Absolute: 0 K/uL (ref 0.0–0.1)
Basophils Relative: 1 %
Eosinophils Absolute: 0.3 K/uL (ref 0.0–0.5)
Eosinophils Relative: 5 %
HCT: 41.4 % (ref 36.0–46.0)
Hemoglobin: 13.1 g/dL (ref 12.0–15.0)
Immature Granulocytes: 0 %
Lymphocytes Relative: 27 %
Lymphs Abs: 1.9 K/uL (ref 0.7–4.0)
MCH: 28.7 pg (ref 26.0–34.0)
MCHC: 31.6 g/dL (ref 30.0–36.0)
MCV: 90.8 fL (ref 80.0–100.0)
Monocytes Absolute: 0.6 K/uL (ref 0.1–1.0)
Monocytes Relative: 9 %
Neutro Abs: 4.2 K/uL (ref 1.7–7.7)
Neutrophils Relative %: 58 %
Platelet Count: 275 K/uL (ref 150–400)
RBC: 4.56 MIL/uL (ref 3.87–5.11)
RDW: 15.1 % (ref 11.5–15.5)
WBC Count: 7.2 K/uL (ref 4.0–10.5)
nRBC: 0 % (ref 0.0–0.2)

## 2024-03-18 LAB — RETIC PANEL
Immature Retic Fract: 16.4 % — ABNORMAL HIGH (ref 2.3–15.9)
RBC.: 4.49 MIL/uL (ref 3.87–5.11)
Retic Count, Absolute: 61.5 K/uL (ref 19.0–186.0)
Retic Ct Pct: 1.4 % (ref 0.4–3.1)
Reticulocyte Hemoglobin: 29.3 pg

## 2024-03-18 LAB — IRON AND TIBC
Iron: 54 ug/dL (ref 28–170)
Saturation Ratios: 10 % — ABNORMAL LOW (ref 10.4–31.8)
TIBC: 528 ug/dL — ABNORMAL HIGH (ref 250–450)
UIBC: 474 ug/dL

## 2024-03-18 LAB — FOLATE: Folate: 13.9 ng/mL

## 2024-03-18 LAB — FERRITIN: Ferritin: 21 ng/mL (ref 11–307)

## 2024-03-20 LAB — COMP PANEL: LEUKEMIA/LYMPHOMA

## 2024-03-21 ENCOUNTER — Inpatient Hospital Stay

## 2024-03-21 ENCOUNTER — Encounter: Payer: Self-pay | Admitting: Oncology

## 2024-03-21 ENCOUNTER — Inpatient Hospital Stay: Admitting: Oncology

## 2024-03-21 VITALS — BP 142/69 | HR 82 | Temp 96.0°F | Resp 19

## 2024-03-21 VITALS — BP 135/79 | HR 75 | Temp 97.4°F | Ht 61.0 in | Wt 240.0 lb

## 2024-03-21 DIAGNOSIS — D72820 Lymphocytosis (symptomatic): Secondary | ICD-10-CM | POA: Diagnosis not present

## 2024-03-21 DIAGNOSIS — K9589 Other complications of other bariatric procedure: Secondary | ICD-10-CM

## 2024-03-21 DIAGNOSIS — D508 Other iron deficiency anemias: Secondary | ICD-10-CM

## 2024-03-21 DIAGNOSIS — I872 Venous insufficiency (chronic) (peripheral): Secondary | ICD-10-CM

## 2024-03-21 MED ORDER — SODIUM CHLORIDE 0.9% FLUSH
10.0000 mL | Freq: Once | INTRAVENOUS | Status: AC | PRN
  Administered 2024-03-21: 10 mL
  Filled 2024-03-21: qty 10

## 2024-03-21 MED ORDER — IRON SUCROSE 20 MG/ML IV SOLN
200.0000 mg | Freq: Once | INTRAVENOUS | Status: AC
  Administered 2024-03-21: 200 mg via INTRAVENOUS
  Filled 2024-03-21: qty 10

## 2024-03-21 NOTE — Assessment & Plan Note (Addendum)
" °  Lab Results  Component Value Date   HGB 13.1 03/18/2024   TIBC 528 (H) 03/18/2024   IRONPCTSAT 10 (L) 03/18/2024   FERRITIN 21 03/18/2024    Decreased iron  saturation  with normal hemoglobin. Option of IV Venofer  treatment was reviewed and discussed with patient.  Rationale and potential side effects were reviewed.  Patient agrees with the plan.  Recommend IV venofer  200mg  x 1 "

## 2024-03-21 NOTE — Progress Notes (Addendum)
 " Hematology/Oncology Consult note [phone] [phone]        REFERRING PROVIDER: Edman Marsa PARAS, DO   CHIEF COMPLAINTS/REASON FOR VISIT:  Iron  deficiency anemia, monoclonal lymphocytosis   ASSESSMENT & PLAN:   Monoclonal B-cell lymphocytosis of undetermined significance Diagnosis was reviewed with patient.  No treatment needed.  Observation.   Iron  deficiency anemia following bariatric surgery  Lab Results  Component Value Date   HGB 13.1 03/18/2024   TIBC 528 (H) 03/18/2024   IRONPCTSAT 10 (L) 03/18/2024   FERRITIN 21 03/18/2024    Decreased iron  saturation  with normal hemoglobin. Option of IV Venofer  treatment was reviewed and discussed with patient.  Rationale and potential side effects were reviewed.  Patient agrees with the plan.  Recommend IV venofer  200mg  x 1  Venous insufficiency of both lower extremities Recommend leg elevation, compression stocking   No orders of the defined types were placed in this encounter.  Follow-up 6 months All questions were answered. The patient knows to call the clinic with any problems, questions or concerns.  Zelphia Cap, MD, PhD [facility] Hematology Oncology 03/21/2024   HISTORY OF PRESENTING ILLNESS:   Janet [name] is a  74 y.o.  female with PMH listed below was seen in consultation at the request of  Edman Marsa PARAS, DO  for evaluation of lymphadenopathy  She discovered small round mass in her groin while applying lotion, which led to further investigation.  02/09/2023 CT abdomen pelvis with contrast showed borderline enlarged inguinal lymph nodes.  10 mm left adrenal gland nodule which is indeterminate. Later she also felt a lymph node in her neck, ultrasound neck showed nonpathologically enlarged bilateral cervical lymph nodes.  She experiences night sweats two to three times a week, lasting about half an hour, but does not require changing clothes. No unintentional weight loss,  and she attributes her stable weight to frequent eating due to severe acid reflux.  She has a history of gastric bypass surgery, which affects her nutritional absorption. She is currently taking iron  and B12 supplements due to deficiencies, which are likely related to her gastric bypass surgery. She has been on iron  supplements for two weeks and has been receiving B12 for a longer period.  She has a history of venous insufficiency, which she believes contributes to chronic inflammation and dermatitis in her lower extremities. This condition causes redness and inflammation, and she experiences chronic dermatitis and skin rashes. She is originally from [place] and has taught her children to cook traditional foods.  INTERVAL HISTORY Janet [name] is a 74 y.o. female who has above history reviewed by me today presents for follow up visit for monoclonal lymphocytosis of unknown significance, iron  deficiency,  Groin rash has resolved.  No new complaints.  MEDICAL HISTORY:  Past Medical History:  Diagnosis Date   Anxiety    a.) on BZO (clonazepam ) PRN   Atherosclerosis of left carotid artery    Cardiac pacemaker in situ    Cervical myelopathy (HCC)    Cervical spinal stenosis    Congenital kyphosis, cervical region    Depression    Diastolic dysfunction    a.)  TTE 05/2021: EF 90%, mild LVH, trivial MR/TR/PR, G1DD   Esophageal reflux    Esophageal stricture    a.) s/p multiple (2012, 2015, 2017, 2020) dilitation procedures   Fibromyositis    GERD (gastroesophageal reflux disease)    Heart murmur    History of deep vein thrombosis    Hyperlipidemia  Hypertension    Insomnia    Lymphedema    Major depression, recurrent, full remission    Osteoarthritis    Osteoporosis    Other secondary kyphosis, cervical region    PAD (peripheral artery disease)    Peripheral nerve disease    PONV (postoperative nausea and vomiting)    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  Jude device   Restless legs syndrome    RLS (restless legs syndrome)    a.) takes pramipexole    Sleep apnea    SOB (shortness of breath)    Spinal stenosis of lumbar region    Spondylolisthesis of lumbar region    SUI (stress urinary incontinence, female)    T2DM (type 2 diabetes mellitus) (HCC)    Urinary incontinence    Venous insufficiency of both lower extremities     SURGICAL HISTORY: Past Surgical History:  Procedure Laterality Date   ABDOMINAL HYSTERECTOMY     ANTERIOR CERVICAL DECOMP/DISCECTOMY FUSION N/A 11/03/2021   Procedure: C4-7 ANTERIOR CERVICAL DISCECTOMY AND FUSION (GLOBUS FORGE);  Surgeon: Clois Fret, MD;  Location: [facility] OR[facility];  Service: Neurosurgery;  Laterality: N/A;   ANTERIOR LATERAL LUMBAR FUSION WITH PERCUTANEOUS SCREW 2 LEVEL N/A 01/26/2022   Procedure: L3-5 LATERAL LUMBAR INTERBODY FUSION WITH POSTERIOR SPINAL FUSION;  Surgeon: Clois Fret, MD;  Location: [facility];  Service: Neurosurgery;  Laterality: N/A;   APPENDECTOMY  2014   APPLICATION OF INTRAOPERATIVE CT SCAN N/A 01/26/2022   Procedure: APPLICATION OF INTRAOPERATIVE CT SCAN;  Surgeon: Clois Fret, MD;  Location: [facility];  Service: Neurosurgery;  Laterality: N/A;   BREAST SURGERY     CARPAL TUNNEL RELEASE Left 1998   COLONOSCOPY N/A 07/28/2023   Procedure: COLONOSCOPY;  Surgeon: Maryruth Ole DASEN, MD;  Location: [facility] ENDOSCOPY;  Service: Endoscopy;  Laterality: N/A;  NO INTERPRETER NEEDED   ESOPHAGEAL DILATION  2012   2015, 2017, 2020   ESOPHAGOGASTRODUODENOSCOPY N/A 07/28/2023   Procedure: EGD (ESOPHAGOGASTRODUODENOSCOPY);  Surgeon: Maryruth Ole DASEN, MD;  Location: [facility] ENDOSCOPY;  Service: Endoscopy;  Laterality: N/A;   GASTRIC BYPASS  2019   HERNIA REPAIR  2019   JOINT REPLACEMENT     left knee manipulation Left 02/05/2019   PACEMAKER IMPLANT  05/2015   Procedure: PACEMAKER INSERTION; Location: [place], [place]; Surgeon: Moody, MD   POLYPECTOMY     SHOULDER SURGERY  Left 2014   SHOULDER SURGERY Right 2017   TOTAL KNEE ARTHROPLASTY Right 2018   TOTAL KNEE ARTHROPLASTY Left 10/18/2018   vein removal Bilateral 2012    SOCIAL HISTORY: Social History   Socioeconomic History   Marital status: Married    Spouse name: Jesus Dols   Number of children: 6   Years of education: Not on file   Highest education level: Not on file  Occupational History   Occupation: Homemaker  Tobacco Use   Smoking status: Former    Current packs/day: 0.00    Average packs/day: 1 pack/day for 25.0 years (25.0 ttl pk-yrs)    Types: Cigarettes    Start date: 69    Quit date: 2007    Years since quitting: 19.0    Passive exposure: Never   Smokeless tobacco: Never  Vaping Use   Vaping status: Never Used  Substance and Sexual Activity   Alcohol use: Never    Comment: very rarely   Drug use: Never   Sexual activity: Not Currently  Other Topics Concern   Not on file  Social History Narrative   Not  on file   Social Drivers of Health   Tobacco Use: Medium Risk (03/21/2024)   Patient History    Smoking Tobacco Use: Former    Smokeless Tobacco Use: Never    Passive Exposure: Never  Physicist, Medical Strain: Medium Risk (11/30/2023)   Overall Financial Resource Strain (CARDIA)    Difficulty of Paying Living Expenses: Somewhat hard  Food Insecurity: Food Insecurity Present (11/24/2023)   Epic    Worried About Programme Researcher, Broadcasting/film/video in the Last Year: Sometimes true    Ran Out of Food in the Last Year: Never true  Transportation Needs: No Transportation Needs (11/30/2023)   Epic    Lack of Transportation (Medical): No    Lack of Transportation (Non-Medical): No  Physical Activity: Inactive (11/24/2023)   Exercise Vital Sign    Days of Exercise per Week: 0 days    Minutes of Exercise per Session: 0 min  Stress: No Stress Concern Present (11/24/2023)   [employer] of Occupational Health - Occupational Stress Questionnaire    Feeling of Stress: Not at all   Social Connections: Unknown (11/24/2023)   Social Connection and Isolation Panel    Frequency of Communication with Friends and Family: Not on file    Frequency of Social Gatherings with Friends and Family: Not on file    Attends Religious Services: Not on file    Active Member of Clubs or Organizations: Not on file    Attends Banker Meetings: Not on file    Marital Status: Separated  Recent Concern: Social Connections - Socially Isolated (11/24/2023)   Social Connection and Isolation Panel    Frequency of Communication with Friends and Family: More than three times a week    Frequency of Social Gatherings with Friends and Family: Once a week    Attends Religious Services: Never    Database Administrator or Organizations: No    Attends Banker Meetings: Never    Marital Status: Divorced  Catering Manager Violence: Not At Risk (11/24/2023)   Epic    Fear of Current or Ex-Partner: No    Emotionally Abused: No    Physically Abused: No    Sexually Abused: No  Depression (PHQ2-9): Low Risk (03/21/2024)   Depression (PHQ2-9)    PHQ-2 Score: 0  Alcohol Screen: Low Risk (11/24/2023)   Alcohol Screen    Last Alcohol Screening Score (AUDIT): 0  Housing: Unknown (11/30/2023)   Epic    Unable to Pay for Housing in the Last Year: No    Number of Times Moved in the Last Year: Not on file    Homeless in the Last Year: No  Utilities: Not At Risk (11/30/2023)   Epic    Threatened with loss of utilities: No  Health Literacy: Adequate Health Literacy (11/24/2023)   B1300 Health Literacy    Frequency of need for help with medical instructions: Never    FAMILY HISTORY: Family History  Problem Relation Age of Onset   Heart attack Mother 68   Heart disease Father    Thyroid  disease Father    Skin cancer Father    Brain cancer Maternal Grandmother    Breast cancer Neg Hx     ALLERGIES:  is allergic to celebrex  [celecoxib ].  MEDICATIONS:  Current Outpatient  Medications  Medication Sig Dispense Refill   aspirin  81 MG EC tablet Take 81 mg by mouth daily.     cephALEXin  (KEFLEX ) 500 MG capsule Take 1 capsule (500 mg total) by  mouth 3 (three) times daily. For 7 days 21 capsule 0   Cyanocobalamin  (VITAMIN B 12 PO) Take by mouth.     hydrochlorothiazide  (HYDRODIURIL ) 25 MG tablet Take 1 tablet (25 mg total) by mouth daily. 90 tablet 1   Iron  Combinations (IRON  COMPLEX PO) Take by mouth.     metoprolol  tartrate (LOPRESSOR ) 25 MG tablet TAKE 1 TABLET BY MOUTH TWICE  DAILY 200 tablet 1   pramipexole  (MIRAPEX ) 1 MG tablet Take 1 tablet (1 mg total) by mouth 2 (two) times daily. 180 tablet 3   pregabalin  (LYRICA ) 100 MG capsule Take 1 capsule (100 mg total) by mouth 3 (three) times daily. 90 capsule 2   traMADol  (ULTRAM ) 50 MG tablet Take 1 tablet (50 mg total) by mouth every 8 (eight) hours as needed. 60 tablet 2   valsartan  (DIOVAN ) 80 MG tablet TAKE 1 TABLET BY MOUTH EVERY DAY 90 tablet 0   VITAMIN D , CHOLECALCIFEROL, PO Take by mouth.     No current facility-administered medications for this visit.    Review of Systems  Constitutional:  Positive for fatigue. Negative for appetite change, chills and fever.  HENT:   Negative for hearing loss and voice change.   Eyes:  Negative for eye problems.  Respiratory:  Negative for chest tightness and cough.   Cardiovascular:  Positive for leg swelling. Negative for chest pain.  Gastrointestinal:  Negative for abdominal distention, abdominal pain and blood in stool.  Endocrine: Negative for hot flashes.  Genitourinary:  Negative for difficulty urinating and frequency.   Musculoskeletal:  Negative for arthralgias.  Skin:  Positive for rash. Negative for itching.  Neurological:  Negative for extremity weakness.  Hematological:  Negative for adenopathy.  Psychiatric/Behavioral:  Negative for confusion.    PHYSICAL EXAMINATION: ECOG PERFORMANCE STATUS: 1 - Symptomatic but completely ambulatory Vitals:    03/21/24 1446 03/21/24 1456  BP: (!) 147/73 135/79  Pulse: 75   Temp: (!) 97.4 F (36.3 C)   SpO2: 100%    Filed Weights   03/21/24 1446  Weight: 240 lb (108.9 kg)    Physical Exam Constitutional:      General: She is not in acute distress.    Appearance: She is obese.  HENT:     Head: Normocephalic and atraumatic.  Eyes:     General: No scleral icterus. Cardiovascular:     Rate and Rhythm: Normal rate and regular rhythm.     Heart sounds: Normal heart sounds.  Pulmonary:     Effort: Pulmonary effort is normal. No respiratory distress.     Breath sounds: No wheezing.  Abdominal:     General: Bowel sounds are normal. There is no distension.     Palpations: Abdomen is soft.  Musculoskeletal:        General: No deformity. Normal range of motion.     Cervical back: Normal range of motion and neck supple.     Right lower leg: Edema present.     Left lower leg: Edema present.  Skin:    General: Skin is warm and dry.     Findings: No erythema or rash.     Comments: Bilateral lower extremity venous insufficiency dermatitis  Neurological:     Mental Status: She is alert and oriented to person, place, and time. Mental status is at baseline.  Psychiatric:        Mood and Affect: Mood normal.     LABORATORY DATA:  I have reviewed the data as listed  Latest Ref Rng & Units 03/18/2024    9:48 AM 03/06/2024   10:21 AM 09/18/2023   10:46 AM  CBC  WBC 4.0 - 10.5 K/uL 7.2  7.2  7.3   Hemoglobin 12.0 - 15.0 g/dL 86.8  88.0  87.6   Hematocrit 36.0 - 46.0 % 41.4  38.2  39.5   Platelets 150 - 400 K/uL 275  264  259       Latest Ref Rng & Units 12/20/2023   11:46 AM 05/08/2023    4:02 PM 01/20/2023    2:01 PM  CMP  Glucose 70 - 99 mg/dL 889  898  90   BUN 8 - 27 mg/dL 20  23  22    Creatinine 0.57 - 1.00 mg/dL 9.20  9.28  9.23   Sodium 134 - 144 mmol/L 143  138  141   Potassium 3.5 - 5.2 mmol/L 4.7  3.8  4.8   Chloride 96 - 106 mmol/L 103  103  105   CO2 22 - 32 mmol/L  26   29   Calcium  8.7 - 10.3 mg/dL 9.2  8.8  9.8   Total Protein 6.0 - 8.5 g/dL 7.3  7.6  7.5   Total Bilirubin 0.0 - 1.2 mg/dL 0.6  0.8  0.7   Alkaline Phos 49 - 135 IU/L 114  90    AST 0 - 40 IU/L 22  20  32   ALT 0 - 44 U/L  19  29       RADIOGRAPHIC STUDIES: I have personally reviewed the radiological images as listed and agreed with the findings in the report. MR CARDIAC MORPHOLOGY W WO CONTRAST Result Date: 03/13/2024 CLINICAL DATA:  Clinical question of infiltrative cardiomyopathy Study assumes HCT of 38 and BSA of 2.18 m2. EXAM: CARDIAC MRI TECHNIQUE: The patient was scanned on a 1.5 Tesla GE magnet. A dedicated cardiac coil was used. Functional imaging was done using Fiesta sequences. 2,3, and 4 chamber views were done to assess for RWMA's. Modified Simpson's rule using was used to calculate an ejection fraction on a dedicated work Research Officer, [employer]. The patient received 10 cc of Gadavist . After 10 minutes inversion recovery sequences were used to assess for infiltration and scar tissue. Flow quantification was performed 2 times during this examination with flow quantification performed at the levels of the ascending aorta above the valve, pulmonary artery above the valve. A dedicated artifact reduction sequence was used to minimize device related susceptibility artifact from the left sided CIED leads, resulting in overall diagnostic image quality. CONTRAST:  10 cc  of Gadavist  FINDINGS: 1. Normal left ventricular size, with LVEDD 54 mm, and LVEDVi 67 mL/m2. Mild increase in left ventricular thickness, with intraventricular septal thickness of 10 mm, posterior wall thickness of 8 mm, and myocardial mass index of 53 g/m2. Normal left ventricular systolic function (LVEF =59%). There are no regional wall motion abnormalities. Left ventricular parametric mapping (1.5 T, MOLLI) notable for normal T2 (49 ms) and ECV (29%). There is no late gadolinium enhancement in the left ventricular  myocardium. Normal resting perfusion. 2. Normal right ventricular size with RVEDVI 62 mL/m2. Normal right ventricular thickness. Normal right ventricular systolic function (RVEF =62%). There are no regional wall motion abnormalities or aneurysms. There is a left sided cardiac implantable electronic device (CIED) with leads projecting toward the right atrial appendage and right ventricle, but the exact lead course and tip positions are not well visualized in this study. 3. Normal left  and right atrial size. QP/QS 0.9 with no evidence of significant intracardiac shunt. 4. Normal size of the aortic root, ascending aorta and pulmonary artery. 5. Valve assessment: Aortic Valve: Tri-leaflet, no significant stenosis or regurgitation. Mean gradient 6 mm Hg. Aortic regurgitant fraction 2%. Pulmonic Valve: Valve morphology not well visualized. No significant stenosis or regurgitation. Regurgitant fraction 2%. Tricuspid Valve:Valve morphology not well visualized. No significant stenosis or regurgitation. Regurgitant fraction 9%. Mitral Valve: Normal morphology. No significant stenosis or prolapse. Mitral regurgitant volume 1 mL, regurgitant fraction 1%. 6.  Normal pericardium.  Trivial  pericardial effusion. 7. Grossly, no extracardiac findings. Recommended dedicated study if concerned for non-cardiac pathology. 8. Susceptibility artifact from the device and leads noted. This decreased the sensitivity of assessment of the apex and valve assessment. IMPRESSION: 1. Study is not suggestive of infiltrative disease or cardiac amyloidosis. 2. Normal left ventricular systolic function (LVEF =59%). 3. Normal right ventricular systolic function (RVEF =62%). Stanly Leavens MD Electronically Signed   By: Stanly Leavens M.D.   On: 03/13/2024 14:58   MR CARDIAC VELOCITY FLOW MAP Result Date: 03/13/2024 CLINICAL DATA:  Clinical question of infiltrative cardiomyopathy Study assumes HCT of 38 and BSA of 2.18 m2. EXAM: CARDIAC  MRI TECHNIQUE: The patient was scanned on a 1.5 Tesla GE magnet. A dedicated cardiac coil was used. Functional imaging was done using Fiesta sequences. 2,3, and 4 chamber views were done to assess for RWMA's. Modified Simpson's rule using was used to calculate an ejection fraction on a dedicated work Research Officer, [employer]. The patient received 10 cc of Gadavist . After 10 minutes inversion recovery sequences were used to assess for infiltration and scar tissue. Flow quantification was performed 2 times during this examination with flow quantification performed at the levels of the ascending aorta above the valve, pulmonary artery above the valve. A dedicated artifact reduction sequence was used to minimize device related susceptibility artifact from the left sided CIED leads, resulting in overall diagnostic image quality. CONTRAST:  10 cc  of Gadavist  FINDINGS: 1. Normal left ventricular size, with LVEDD 54 mm, and LVEDVi 67 mL/m2. Mild increase in left ventricular thickness, with intraventricular septal thickness of 10 mm, posterior wall thickness of 8 mm, and myocardial mass index of 53 g/m2. Normal left ventricular systolic function (LVEF =59%). There are no regional wall motion abnormalities. Left ventricular parametric mapping (1.5 T, MOLLI) notable for normal T2 (49 ms) and ECV (29%). There is no late gadolinium enhancement in the left ventricular myocardium. Normal resting perfusion. 2. Normal right ventricular size with RVEDVI 62 mL/m2. Normal right ventricular thickness. Normal right ventricular systolic function (RVEF =62%). There are no regional wall motion abnormalities or aneurysms. There is a left sided cardiac implantable electronic device (CIED) with leads projecting toward the right atrial appendage and right ventricle, but the exact lead course and tip positions are not well visualized in this study. 3. Normal left and right atrial size. QP/QS 0.9 with no evidence of significant intracardiac  shunt. 4. Normal size of the aortic root, ascending aorta and pulmonary artery. 5. Valve assessment: Aortic Valve: Tri-leaflet, no significant stenosis or regurgitation. Mean gradient 6 mm Hg. Aortic regurgitant fraction 2%. Pulmonic Valve: Valve morphology not well visualized. No significant stenosis or regurgitation. Regurgitant fraction 2%. Tricuspid Valve:Valve morphology not well visualized. No significant stenosis or regurgitation. Regurgitant fraction 9%. Mitral Valve: Normal morphology. No significant stenosis or prolapse. Mitral regurgitant volume 1 mL, regurgitant fraction 1%. 6.  Normal pericardium.  Trivial  pericardial  effusion. 7. Grossly, no extracardiac findings. Recommended dedicated study if concerned for non-cardiac pathology. 8. Susceptibility artifact from the device and leads noted. This decreased the sensitivity of assessment of the apex and valve assessment. IMPRESSION: 1. Study is not suggestive of infiltrative disease or cardiac amyloidosis. 2. Normal left ventricular systolic function (LVEF =59%). 3. Normal right ventricular systolic function (RVEF =62%). Stanly Leavens MD Electronically Signed   By: Stanly Leavens M.D.   On: 03/13/2024 14:58   MR CARDIAC VELOCITY FLOW MAP Result Date: 03/13/2024 CLINICAL DATA:  Clinical question of infiltrative cardiomyopathy Study assumes HCT of 38 and BSA of 2.18 m2. EXAM: CARDIAC MRI TECHNIQUE: The patient was scanned on a 1.5 Tesla GE magnet. A dedicated cardiac coil was used. Functional imaging was done using Fiesta sequences. 2,3, and 4 chamber views were done to assess for RWMA's. Modified Simpson's rule using was used to calculate an ejection fraction on a dedicated work Research Officer, [employer]. The patient received 10 cc of Gadavist . After 10 minutes inversion recovery sequences were used to assess for infiltration and scar tissue. Flow quantification was performed 2 times during this examination with flow quantification  performed at the levels of the ascending aorta above the valve, pulmonary artery above the valve. A dedicated artifact reduction sequence was used to minimize device related susceptibility artifact from the left sided CIED leads, resulting in overall diagnostic image quality. CONTRAST:  10 cc  of Gadavist  FINDINGS: 1. Normal left ventricular size, with LVEDD 54 mm, and LVEDVi 67 mL/m2. Mild increase in left ventricular thickness, with intraventricular septal thickness of 10 mm, posterior wall thickness of 8 mm, and myocardial mass index of 53 g/m2. Normal left ventricular systolic function (LVEF =59%). There are no regional wall motion abnormalities. Left ventricular parametric mapping (1.5 T, MOLLI) notable for normal T2 (49 ms) and ECV (29%). There is no late gadolinium enhancement in the left ventricular myocardium. Normal resting perfusion. 2. Normal right ventricular size with RVEDVI 62 mL/m2. Normal right ventricular thickness. Normal right ventricular systolic function (RVEF =62%). There are no regional wall motion abnormalities or aneurysms. There is a left sided cardiac implantable electronic device (CIED) with leads projecting toward the right atrial appendage and right ventricle, but the exact lead course and tip positions are not well visualized in this study. 3. Normal left and right atrial size. QP/QS 0.9 with no evidence of significant intracardiac shunt. 4. Normal size of the aortic root, ascending aorta and pulmonary artery. 5. Valve assessment: Aortic Valve: Tri-leaflet, no significant stenosis or regurgitation. Mean gradient 6 mm Hg. Aortic regurgitant fraction 2%. Pulmonic Valve: Valve morphology not well visualized. No significant stenosis or regurgitation. Regurgitant fraction 2%. Tricuspid Valve:Valve morphology not well visualized. No significant stenosis or regurgitation. Regurgitant fraction 9%. Mitral Valve: Normal morphology. No significant stenosis or prolapse. Mitral regurgitant volume  1 mL, regurgitant fraction 1%. 6.  Normal pericardium.  Trivial  pericardial effusion. 7. Grossly, no extracardiac findings. Recommended dedicated study if concerned for non-cardiac pathology. 8. Susceptibility artifact from the device and leads noted. This decreased the sensitivity of assessment of the apex and valve assessment. IMPRESSION: 1. Study is not suggestive of infiltrative disease or cardiac amyloidosis. 2. Normal left ventricular systolic function (LVEF =59%). 3. Normal right ventricular systolic function (RVEF =62%). Stanly Leavens MD Electronically Signed   By: Stanly Leavens M.D.   On: 03/13/2024 14:58   MR LUMBAR SPINE WO CONTRAST Result Date: 03/08/2024 CLINICAL DATA:  Low back pain EXAM: MRI  LUMBAR SPINE WITHOUT CONTRAST TECHNIQUE: Multiplanar, multisequence MR imaging of the lumbar spine was performed. No intravenous contrast was administered. COMPARISON:  09/30/2021 FINDINGS: Bone marrow: No significant abnormality Conus and cauda equina: No significant abnormality Paraspinal tissues: No significant abnormality L1-L2: The disc is normal.  No significant facet disease L2-L3: There is mild degenerative disc disease with a mild disc bulge. There is moderate facet arthropathy with facet enlargement and moderate spinal stenosis. No significant foraminal stenosis L3-L4: Previous PLIF with posterolateral fusion with solid arthrodesis. No spinal stenosis or foraminal stenosis L4-L5: Previous PLIF with posterolateral fusion with solid arthrodesis. There is facet enlargement and severe spinal stenosis. No significant foraminal stenosis L5-S1: The disc is normal. There is moderate facet arthropathy. No spinal stenosis or foraminal stenosis IMPRESSION: 1. Moderate spinal stenosis at L2-3 due to disc bulge and facet arthropathy 2. Interval PLIF with posterolateral fusion at L3-4 with no spinal stenosis 3. Interval PLIF with posterolateral fusion at L4-5. There is severe spinal stenosis due to  enlargement of the facet joints/posterior fusion mass. 4. Moderate facet arthropathy at L5-S1 without spinal stenosis Electronically Signed   By: Nancyann Burns M.D.   On: 03/08/2024 15:41   MR CERVICAL SPINE WO CONTRAST Result Date: 03/06/2024 CLINICAL DATA:  Initial evaluation for chronic neck pain, cervical radiculopathy. EXAM: MRI CERVICAL SPINE WITHOUT CONTRAST TECHNIQUE: Multiplanar, multisequence MR imaging of the cervical spine was performed. No intravenous contrast was administered. COMPARISON:  Prior MRI from 09/30/2021. FINDINGS: Alignment: Examination degraded by motion artifact. Straightening of the normal cervical lordosis. Degenerative grade 1 anterolisthesis of C2 on C3, C3 on C4, and C7 on T1. Vertebrae: Postoperative changes from prior ACDF at C4-C7. Vertebral body height maintained without acute or chronic fracture. Bone marrow signal intensity within normal limits. No worrisome osseous lesions. Reactive marrow edema present about the right C3-4 facet due to facet arthritis (series 4, image 4). Cord: Normal signal and morphology allowing for motion artifact. Posterior Fossa, vertebral arteries, paraspinal tissues: Visualized brain and posterior fossa within normal limits. Maxillary sinus retention cyst noted. P craniocervical junction within normal limits. Mild soft tissue edema seen adjacent to the spinous processes of C5 and C6, likely reflecting changes of mild bursitis (series 4, image 9). Preserved flow void seen within the vertebral arteries bilaterally. Disc levels: C2-C3: Tiny central disc protrusion contacts the ventral thecal sac. Mild left with moderate right facet arthrosis. No spinal stenosis. Mild right C3 foraminal narrowing. Left neural foramen remains patent. C3-C4: Small central disc protrusion indents the ventral thecal sac (series 5, image 16). Moderate right with mild left facet arthrosis with reactive marrow edema on the right. Mild spinal stenosis. Superimposed right worse  than left uncovertebral spurring with moderate right C4 foraminal narrowing. Left neural foramina remains patent. C4-C5: Prior fusion. No residual spinal stenosis. Foramina appear patent. C5-C6: Prior fusion. No residual spinal stenosis. Foramina appear patent. C6-C7: Prior fusion. No residual spinal stenosis. Left-sided uncovertebral and facet hypertrophy with residual moderate left C7 foraminal stenosis. Right neural foramina appears patent. C7-T1: Trace anterolisthesis. Disc desiccation without significant disc bulge. Moderate bilateral facet arthrosis. No spinal stenosis. Mild right C8 foraminal narrowing. Left neural foramina remains patent. T1-2: Trace anterolisthesis with mild disc bulge. Right greater than left facet hypertrophy. No spinal stenosis. Mild bilateral foraminal narrowing. IMPRESSION: 1. Prior ACDF at C4-C7 without residual spinal stenosis. 2. 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 3.  Moderate to advanced right upper facet arthrosis at C2-3 and C3-4. Associated mild reactive marrow edema about the right C3-4 facet due to facet arthritis. Findings could contribute to neck pain. 4. Mild soft tissue edema adjacent to the spinous processes of C5 and C6, likely reflecting changes of mild bursitis. Electronically Signed   By: Morene Hoard M.D.   On: 03/06/2024 09:46          "

## 2024-03-21 NOTE — Assessment & Plan Note (Signed)
 Recommend leg elevation, compression stocking

## 2024-03-21 NOTE — Addendum Note (Signed)
 Addended by: BABARA CALL on: 03/21/2024 10:26 PM   Modules accepted: Orders

## 2024-03-21 NOTE — Assessment & Plan Note (Signed)
 Diagnosis was reviewed with patient.  No treatment needed.  Observation.

## 2024-03-23 ENCOUNTER — Other Ambulatory Visit: Payer: Self-pay | Admitting: Family Medicine

## 2024-03-23 DIAGNOSIS — K219 Gastro-esophageal reflux disease without esophagitis: Secondary | ICD-10-CM

## 2024-03-25 NOTE — Telephone Encounter (Signed)
 D/C 04/14/23. Requested Prescriptions  Refused Prescriptions Disp Refills   pantoprazole  (PROTONIX ) 40 MG tablet [Pharmacy Med Name: PANTOPRAZOLE  SOD DR 40 MG TAB] 90 tablet 3    Sig: TAKE 1 TABLET BY MOUTH DAILY BEFORE BREAKFAST     Gastroenterology: Proton Pump Inhibitors Passed - 03/25/2024 11:12 AM      Passed - Valid encounter within last 12 months    Recent Outpatient Visits           1 week ago Cyst of bone of left shoulder   [place] [facility] [place], Marsa PARAS, DO   2 weeks ago Edema, unspecified type   [place] [facility] Everlene Parris LABOR, MD   2 months ago Acute nasopharyngitis   [place] [facility] [employer], Angeline ORN, NP   5 months ago Chronic pelvic pain in female   [facility] [place] Marsa PARAS, DO   11 months ago Gastroesophageal reflux disease without esophagitis   [place] [facility] [place], Marsa PARAS, [place]

## 2024-04-01 NOTE — Addendum Note (Signed)
 Encounter addended by: Thayne Consuelo DEL on: 04/01/2024 11:11 AM  Actions taken: Imaging Exam ended

## 2024-04-08 ENCOUNTER — Ambulatory Visit (INDEPENDENT_AMBULATORY_CARE_PROVIDER_SITE_OTHER): Admitting: Vascular Surgery

## 2024-04-18 ENCOUNTER — Ambulatory Visit (INDEPENDENT_AMBULATORY_CARE_PROVIDER_SITE_OTHER): Admitting: Vascular Surgery

## 2024-06-13 ENCOUNTER — Ambulatory Visit: Admitting: Family Medicine

## 2024-09-19 ENCOUNTER — Inpatient Hospital Stay

## 2024-09-26 ENCOUNTER — Inpatient Hospital Stay

## 2024-09-26 ENCOUNTER — Inpatient Hospital Stay: Admitting: Oncology

## 2024-12-06 ENCOUNTER — Ambulatory Visit
